# Patient Record
Sex: Female | Born: 1960
Health system: Southern US, Community
[De-identification: ages and names within clinical notes are randomized; demographics above are authoritative.]

## PROBLEM LIST (undated history)

## (undated) DIAGNOSIS — C439 Malignant melanoma of skin, unspecified: Secondary | ICD-10-CM

## (undated) DIAGNOSIS — E039 Hypothyroidism, unspecified: Secondary | ICD-10-CM

## (undated) DIAGNOSIS — F0390 Unspecified dementia without behavioral disturbance: Secondary | ICD-10-CM

## (undated) DIAGNOSIS — C3490 Malignant neoplasm of unspecified part of unspecified bronchus or lung: Secondary | ICD-10-CM

## (undated) DIAGNOSIS — F419 Anxiety disorder, unspecified: Secondary | ICD-10-CM

## (undated) DIAGNOSIS — H53462 Homonymous bilateral field defects, left side: Secondary | ICD-10-CM

## (undated) DIAGNOSIS — C719 Malignant neoplasm of brain, unspecified: Secondary | ICD-10-CM

## (undated) DIAGNOSIS — G4489 Other headache syndrome: Secondary | ICD-10-CM

## (undated) DIAGNOSIS — K219 Gastro-esophageal reflux disease without esophagitis: Secondary | ICD-10-CM

## (undated) DIAGNOSIS — E079 Disorder of thyroid, unspecified: Secondary | ICD-10-CM

## (undated) DIAGNOSIS — H93239 Hyperacusis, unspecified ear: Secondary | ICD-10-CM

## (undated) DIAGNOSIS — S0300XA Dislocation of jaw, unspecified side, initial encounter: Secondary | ICD-10-CM

## (undated) HISTORY — PX: APPENDECTOMY: SHX54

## (undated) HISTORY — DX: Hyperacusis, unspecified ear: H93.239

## (undated) HISTORY — DX: Other headache syndrome: G44.89

## (undated) HISTORY — DX: Malignant melanoma of skin, unspecified: C43.9

## (undated) HISTORY — DX: Malignant neoplasm of brain, unspecified: C71.9

## (undated) HISTORY — DX: Malignant neoplasm of unspecified part of unspecified bronchus or lung: C34.90

## (undated) HISTORY — PX: TONSILLECTOMY: SUR1361

---
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
## 2002-08-21 ENCOUNTER — Other Ambulatory Visit: Admission: RE | Admit: 2002-08-21 | Discharge: 2002-08-21 | Payer: Self-pay | Admitting: *Deleted

## 2003-04-13 ENCOUNTER — Emergency Department (HOSPITAL_COMMUNITY): Admission: EM | Admit: 2003-04-13 | Discharge: 2003-04-13 | Payer: Self-pay | Admitting: Emergency Medicine

## 2003-04-13 ENCOUNTER — Encounter: Payer: Self-pay | Admitting: Emergency Medicine

## 2010-12-18 ENCOUNTER — Encounter: Payer: Self-pay | Admitting: Family Medicine

## 2013-11-14 ENCOUNTER — Emergency Department (HOSPITAL_COMMUNITY): Payer: 59

## 2013-11-14 ENCOUNTER — Inpatient Hospital Stay (HOSPITAL_BASED_OUTPATIENT_CLINIC_OR_DEPARTMENT_OTHER)
Admission: EM | Admit: 2013-11-14 | Discharge: 2013-11-22 | DRG: 025 | Disposition: A | Discharge status: Home / Self Care | Payer: 59 | Attending: Neurological Surgery | Admitting: Neurological Surgery

## 2013-11-14 ENCOUNTER — Encounter (HOSPITAL_BASED_OUTPATIENT_CLINIC_OR_DEPARTMENT_OTHER): Payer: Self-pay | Admitting: Emergency Medicine

## 2013-11-14 DIAGNOSIS — Z720 Tobacco use: Secondary | ICD-10-CM | POA: Diagnosis present

## 2013-11-14 DIAGNOSIS — D72829 Elevated white blood cell count, unspecified: Secondary | ICD-10-CM | POA: Diagnosis present

## 2013-11-14 DIAGNOSIS — E039 Hypothyroidism, unspecified: Secondary | ICD-10-CM | POA: Diagnosis present

## 2013-11-14 DIAGNOSIS — F411 Generalized anxiety disorder: Secondary | ICD-10-CM | POA: Diagnosis not present

## 2013-11-14 DIAGNOSIS — M26609 Unspecified temporomandibular joint disorder, unspecified side: Secondary | ICD-10-CM | POA: Diagnosis present

## 2013-11-14 DIAGNOSIS — C7931 Secondary malignant neoplasm of brain: Principal | ICD-10-CM | POA: Diagnosis present

## 2013-11-14 DIAGNOSIS — Z820 Family history of epilepsy and other diseases of the nervous system: Secondary | ICD-10-CM

## 2013-11-14 DIAGNOSIS — R911 Solitary pulmonary nodule: Secondary | ICD-10-CM | POA: Diagnosis present

## 2013-11-14 DIAGNOSIS — F17200 Nicotine dependence, unspecified, uncomplicated: Secondary | ICD-10-CM | POA: Diagnosis present

## 2013-11-14 DIAGNOSIS — G9389 Other specified disorders of brain: Secondary | ICD-10-CM | POA: Diagnosis present

## 2013-11-14 DIAGNOSIS — C3490 Malignant neoplasm of unspecified part of unspecified bronchus or lung: Secondary | ICD-10-CM | POA: Diagnosis present

## 2013-11-14 DIAGNOSIS — G936 Cerebral edema: Secondary | ICD-10-CM | POA: Diagnosis present

## 2013-11-14 DIAGNOSIS — Z79899 Other long term (current) drug therapy: Secondary | ICD-10-CM

## 2013-11-14 DIAGNOSIS — Z9889 Other specified postprocedural states: Secondary | ICD-10-CM

## 2013-11-14 HISTORY — DX: Dislocation of jaw, unspecified side, initial encounter: S03.00XA

## 2013-11-14 HISTORY — DX: Disorder of thyroid, unspecified: E07.9

## 2013-11-14 LAB — CBC WITH DIFFERENTIAL/PLATELET
Basophils Absolute: 0 10*3/uL (ref 0.0–0.1)
Eosinophils Absolute: 0.1 10*3/uL (ref 0.0–0.7)
Eosinophils Relative: 0 % (ref 0–5)
MCH: 31.7 pg (ref 26.0–34.0)
MCV: 89.7 fL (ref 78.0–100.0)
Neutro Abs: 8.8 10*3/uL — ABNORMAL HIGH (ref 1.7–7.7)
RBC: 5.15 MIL/uL — ABNORMAL HIGH (ref 3.87–5.11)
RDW: 13.1 % (ref 11.5–15.5)
WBC: 11.3 10*3/uL — ABNORMAL HIGH (ref 4.0–10.5)

## 2013-11-14 LAB — BASIC METABOLIC PANEL
BUN: 14 mg/dL (ref 6–23)
CO2: 27 mEq/L (ref 19–32)
Chloride: 100 mEq/L (ref 96–112)
Creatinine, Ser: 0.6 mg/dL (ref 0.50–1.10)
Potassium: 3.7 mEq/L (ref 3.5–5.1)

## 2013-11-14 MED ORDER — HYDROMORPHONE HCL PF 1 MG/ML IJ SOLN: 1.0000 mg | Freq: Once | INTRAMUSCULAR | Status: DC

## 2013-11-14 MED ORDER — MECLIZINE HCL 25 MG PO TABS
25.0000 mg | ORAL_TABLET | Freq: Once | ORAL | Status: AC
  Administered 2013-11-14: 25 mg via ORAL
  Filled 2013-11-14: qty 1

## 2013-11-14 MED ORDER — DEXAMETHASONE SODIUM PHOSPHATE 10 MG/ML IJ SOLN
10.0000 mg | Freq: Once | INTRAMUSCULAR | Status: AC
  Administered 2013-11-14: 10 mg via INTRAVENOUS
  Filled 2013-11-14: qty 1

## 2013-11-14 MED ORDER — ONDANSETRON 4 MG PO TBDP
4.0000 mg | ORAL_TABLET | Freq: Once | ORAL | Status: AC
  Administered 2013-11-14: 4 mg via ORAL
  Filled 2013-11-14: qty 1

## 2013-11-14 MED ORDER — SODIUM CHLORIDE 0.9 % IV BOLUS (SEPSIS)
1000.0000 mL | Freq: Once | INTRAVENOUS | Status: AC
  Administered 2013-11-14: 1000 mL via INTRAVENOUS

## 2013-11-14 MED ORDER — GADOBENATE DIMEGLUMINE 529 MG/ML IV SOLN
10.0000 mL | Freq: Once | INTRAVENOUS | Status: AC | PRN
  Administered 2013-11-14: 10 mL via INTRAVENOUS

## 2013-11-14 MED ORDER — DEXAMETHASONE SODIUM PHOSPHATE 10 MG/ML IJ SOLN
6.0000 mg | Freq: Four times a day (QID) | INTRAMUSCULAR | Status: DC
  Administered 2013-11-14: 6 mg via INTRAVENOUS
  Filled 2013-11-14: qty 1

## 2013-11-14 MED ORDER — LORAZEPAM 2 MG/ML IJ SOLN
0.5000 mg | Freq: Once | INTRAMUSCULAR | Status: AC
  Administered 2013-11-14: 0.5 mg via INTRAVENOUS
  Filled 2013-11-14: qty 1

## 2013-11-14 MED ORDER — METOCLOPRAMIDE HCL 5 MG/ML IJ SOLN
10.0000 mg | Freq: Once | INTRAMUSCULAR | Status: AC
  Administered 2013-11-14: 10 mg via INTRAVENOUS
  Filled 2013-11-14: qty 2

## 2013-11-14 MED ORDER — SODIUM CHLORIDE 0.9 % IV SOLN
INTRAVENOUS | Status: DC
  Administered 2013-11-14: 18:00:00 via INTRAVENOUS

## 2013-11-14 MED ORDER — ONDANSETRON HCL 4 MG/2ML IJ SOLN
4.0000 mg | Freq: Once | INTRAMUSCULAR | Status: AC
  Administered 2013-11-14: 4 mg via INTRAVENOUS
  Filled 2013-11-14: qty 2

## 2013-11-14 MED ORDER — NICOTINE 14 MG/24HR TD PT24
14.0000 mg | MEDICATED_PATCH | Freq: Once | TRANSDERMAL | Status: AC
  Administered 2013-11-14 – 2013-11-15 (×2): 14 mg via TRANSDERMAL
  Filled 2013-11-14 (×2): qty 1

## 2013-11-14 NOTE — ED Provider Notes (Signed)
Medical screening examination/treatment/procedure(s) were conducted as a shared visit with non-physician practitioner(s) and myself.  I personally evaluated the patient during the encounter.  EKG Interpretation   None       Ct Head Wo Contrast  11/14/2013   CLINICAL DATA:  Dizziness.  EXAM: CT HEAD WITHOUT CONTRAST  TECHNIQUE: Contiguous axial images were obtained from the base of the skull through the vertex without intravenous contrast.  COMPARISON:  None.  FINDINGS: An 8 mm focus of hyperdensity is present within the right cerebellum with significant surrounding edema. This creates mass effect on the 4th ventricle with midline shift and partial effacement. The basal cisterns are partially effaced as well.  The supratentorial brain is within normal limits. No acute cortical infarct or mass lesion is present. No other hemorrhage is evident. The lateral ventricles are of normal size. No significant extra-axial fluid collection is present.  IMPRESSION: 1. 8 mm hyperdensity in within the right cerebellum concerning for hemorrhage or mass lesion with significant surrounding vasogenic edema. MRI of the brain without and with contrast is recommended for further evaluation. 2. No significant supratentorial lesions are evident. Critical Value/emergent results were called by telephone at the time of interpretation on 11/14/2013 at 4:01 PM to Dr. Blinda Leatherwood , who verbally acknowledged these results.   Electronically Signed   By: Gennette Pac M.D.   On: 11/14/2013 16:01   Mr Laqueta Jean ZO Contrast  11/14/2013   CLINICAL DATA:  Dizziness.  Emesis.  EXAM: MRI HEAD WITHOUT AND WITH CONTRAST  TECHNIQUE: Multiplanar, multiecho pulse sequences of the brain and surrounding structures were obtained without and with intravenous contrast.  CONTRAST:  10mL MULTIHANCE GADOBENATE DIMEGLUMINE 529 MG/ML IV SOLN  COMPARISON:  CT head earlier in the day at 1552 hr.  FINDINGS: As suspected from CT, there is a right cerebellar  hemispheric mass with marked surrounding edema. The lesion contains a nonenhancing T2 hypointense component roughly 1 cm in size, best displayed on coronal T2 weighted images. The enhancing portion of the mass is somewhat larger, measuring 16 x 17 x 13 mm, appears infiltrative, with vasogenic edema spreading into the vermis and displacing the 4th ventricle approximately 2 mm right to left. The 4th ventricle is sufficiently compressed the raise concern of impending hydrocephalus should the edema worsen. No other intracranial lesions are seen.  There are no areas of acute stroke, extra-axial fluid, or hydrocephalus. Calvarium intact without osseous lesions. No craniocervical junction or upper cervical abnormality. No sinus or mastoid disease. Negative orbits. Intracranial vasculature widely patent.  Compared with the earlier CT, the appearance is not significantly worsened.  IMPRESSION: 16 x 17 x 13 mm right cerebellar infiltrative mass with marked surrounding edema. Centrally there is a component of predominantly T2 shortening suggesting acute blood or melanin. Calcification/mineralization felt less likely. Metastases which could present with this constellation of imaging findings include melanoma, hemorrhagic lung, breast, renal cell, or thyroid carcinoma, versus a primary cerebellar brain tumor such as glioma. Cerebral abscess is not favored.  Significant vasogenic edema in the setting of a possibly acutely hemorrhagic mass is concerning. The patient should be carefully observed in a neuro ICU setting for signs of increased intracranial pressure should the lesion worsen due to increasing hemorrhage or obstruction of the 4th ventricle.  Findings discussed with ordering provider at the time of dictation.   Electronically Signed   By: Davonna Belling M.D.   On: 11/14/2013 19:25  I personally reviewed the imaging tests through PACS system I reviewed available  ER/hospitalization records through the EMR I discussed the  findings with radiology  Patient is overall well-appearing.  Her symptoms seem improved at this time.  She's feeling better.  She is alert and oriented x3.  She will benefit from a missed the step down unit but wanted to be followed closely.  This was discussed with neurosurgery who will consult.  Decadron ordered after head CT findings found.  CRITICAL CARE Performed by: Lyanne Co Total critical care time: 32 Critical care time was exclusive of separately billable procedures and treating other patients. Critical care was necessary to treat or prevent imminent or life-threatening deterioration. Critical care was time spent personally by me on the following activities: development of treatment plan with patient and/or surrogate as well as nursing, discussions with consultants, evaluation of patient's response to treatment, examination of patient, obtaining history from patient or surrogate, ordering and performing treatments and interventions, ordering and review of laboratory studies, ordering and review of radiographic studies, pulse oximetry and re-evaluation of patient's condition.   Lyanne Co, MD 11/14/13 2130

## 2013-11-14 NOTE — ED Notes (Signed)
Pt c/o headache, dizziness and vomiting intermittently x 1 wk. Pt sts she was diagnosed at PCP with gastroenteritis Tuesday and given phenergan. Pt sts she ate and drank yesterday and became dizzy last night when lying down and vomiting again.

## 2013-11-14 NOTE — ED Provider Notes (Signed)
Patient to the ED transferred by Dr. Jodi Mourning from [facility].   "52 yo female with smoking hx, no stroke hx presents with recurrent vertigo for one week, pt saw pcp Tue and phenergan improved vomiting and sxs however returned. Worse with head movement or lying flat at night. Lasts up to a minute then resolves with not moving. No gait or vision changes. Pt has posterior HA for the past week, gradual onset. No other neuro sxs. " - Dr. Jodi Mourning  Patient continues to have dizziness. CT head ordered.   Ct Head Wo Contrast  11/14/2013   CLINICAL DATA:  Dizziness.  EXAM: CT HEAD WITHOUT CONTRAST  TECHNIQUE: Contiguous axial images were obtained from the base of the skull through the vertex without intravenous contrast.  COMPARISON:  None.  FINDINGS: An 8 mm focus of hyperdensity is present within the right cerebellum with significant surrounding edema. This creates mass effect on the 4th ventricle with midline shift and partial effacement. The basal cisterns are partially effaced as well.  The supratentorial brain is within normal limits. No acute cortical infarct or mass lesion is present. No other hemorrhage is evident. The lateral ventricles are of normal size. No significant extra-axial fluid collection is present.  IMPRESSION: 1. 8 mm hyperdensity in within the right cerebellum concerning for hemorrhage or mass lesion with significant surrounding vasogenic edema. MRI of the brain without and with contrast is recommended for further evaluation. 2. No significant supratentorial lesions are evident. Critical Value/emergent results were called by telephone at the time of interpretation on 11/14/2013 at 4:01 PM to Dr. Blinda Leatherwood , who verbally acknowledged these results.   Electronically Signed   By: Gennette Pac M.D.   On: 11/14/2013 16:01   4:15pm. CT head shows abnormality.  Decadron 10mg  IV, Antivert and Zofran 4mg  ODT ordered.  MRI of brain pending to better characterize brain  mass.  7:39pm  Radiologist reports that patient has large mass to occipital region with significant edema, questionable whether it is slowly bleeding or if the mass is highly vascularized. Patient needs neurosurgical consult.  Dr. Patria Mane and I re-evaluated patient together, she is comfortable, awake, alerted and oriented. Continues to endorse positional dizziness.  8:22pm- I spoke with Neurosurg Dr. Yetta Barre who has reviewed Images and recommends she be transferred to [facility] and admitted to step-down by medicine for metastatic work-up. He will consult.  Dr. Toniann Fail has agreed to admit patient, Dr. Patria Mane has done EMTALA, pt to be transferred to [facility].  Dorthula Matas, PA-C 11/14/13 2023

## 2013-11-14 NOTE — ED Notes (Signed)
Report called to Charge nurse at [facility]

## 2013-11-14 NOTE — H&P (Signed)
[facility] History and Physical  [name] [name] YNW:[id number] DOB: Jan 30, 1961 DOA: 11/14/2013  Referring physician: ER physician. PCP: Joycelyn Rua, MD   Chief Complaint: Headache and dizziness.  HPI: [name] [name] is a 52 y.o. female with history of hypothyroidism and ongoing tobacco abuse presented to the ER because of dizziness headache and nausea vomiting. Patient has been having these symptoms for last 5 days. Patient's dizziness is mostly positional and increases on turning. Patient's headache is mostly occipital region and patient feels like a tightness around the neck. Patient does not have any weakness of the upper lower extremities but felt difficulty writing last few days. Denies any blurred vision difficulty speaking or swallowing. Patient did not lose consciousness. In the ER MRI of the brain shows cerebellar mass with edema. On-call neurosurgeon Dr. Yetta Barre was consulted by the ER physician and at this time Dr. Yetta Barre has requested IV Decadron, transfer to [facility] and metastatic workup. Patient otherwise denies any chest pain shortness of breath abdominal pain diarrhea fever chills productive cough.   Review of Systems: As presented in the history of presenting illness, rest negative.  Past Medical History  Diagnosis Date  . TMJ (dislocation of temporomandibular joint)   . Thyroid disease    Past Surgical History  Procedure Laterality Date  . Tonsillectomy    . Appendectomy     Social History:  reports that she has been smoking.  She does not have any smokeless tobacco history on file. She reports that she does not drink alcohol or use illicit drugs. Where does patient live home. Can patient participate in ADLs? Yes.  No Known Allergies  Family History:  Family History  Problem Relation Age of Onset  . Dementia Mother       Prior to Admission medications   Medication Sig Start Date End Date Taking? Authorizing Provider  levothyroxine (SYNTHROID,  LEVOTHROID) 125 MCG tablet Take 125 mcg by mouth daily before breakfast.   Yes Historical Provider, MD  piroxicam (FELDENE) 20 MG capsule Take 20 mg by mouth daily.   Yes Historical Provider, MD  Xylometazoline HCl (4-WAY NASAL SPRAY NA) Place 1 spray into the nose as needed (for nasal congestion).   Yes Historical Provider, MD    Physical Exam: Filed Vitals:   11/14/13 1300 11/14/13 1330 11/14/13 1400 11/14/13 1950  BP: 124/82 135/81  124/86  Pulse: 63 68 63 64  Temp:    97.8 F (36.6 C)  TempSrc:    Oral  Resp:    18  SpO2: 97% 100% 99% 99%     General:  Well-developed and nourished.  Eyes: Anicteric no pallor.  ENT: No discharge from ears eyes nose mouth.  Neck: No mass felt.  Cardiovascular: S1-S2 heard.  Respiratory: No rhonchi or crepitations.  Abdomen: Soft nontender bowel sounds present.  Skin: No rash.  Musculoskeletal: No edema.  Psychiatric: Appears normal.  Neurologic: Alert awake oriented to time place and person. Moves all extremities 5 x 5. No facial asymmetry.  Labs on Admission:  Basic Metabolic Panel:  Recent Labs Lab 11/14/13 1035  NA 139  K 3.7  CL 100  CO2 27  GLUCOSE 128*  BUN 14  CREATININE 0.60  CALCIUM 10.2   Liver Function Tests: No results found for this basename: AST, ALT, ALKPHOS, BILITOT, PROT, ALBUMIN,  in the last 168 hours No results found for this basename: LIPASE, AMYLASE,  in the last 168 hours No results found for this basename: AMMONIA,  in the last  168 hours CBC:  Recent Labs Lab 11/14/13 1035  WBC 11.3*  NEUTROABS 8.8*  HGB 16.3*  HCT 46.2*  MCV 89.7  PLT 311   Cardiac Enzymes: No results found for this basename: CKTOTAL, CKMB, CKMBINDEX, TROPONINI,  in the last 168 hours  BNP (last 3 results) No results found for this basename: PROBNP,  in the last 8760 hours CBG: No results found for this basename: GLUCAP,  in the last 168 hours  Radiological Exams on Admission: Ct Head Wo Contrast  11/14/2013    CLINICAL DATA:  Dizziness.  EXAM: CT HEAD WITHOUT CONTRAST  TECHNIQUE: Contiguous axial images were obtained from the base of the skull through the vertex without intravenous contrast.  COMPARISON:  None.  FINDINGS: An 8 mm focus of hyperdensity is present within the right cerebellum with significant surrounding edema. This creates mass effect on the 4th ventricle with midline shift and partial effacement. The basal cisterns are partially effaced as well.  The supratentorial brain is within normal limits. No acute cortical infarct or mass lesion is present. No other hemorrhage is evident. The lateral ventricles are of normal size. No significant extra-axial fluid collection is present.  IMPRESSION: 1. 8 mm hyperdensity in within the right cerebellum concerning for hemorrhage or mass lesion with significant surrounding vasogenic edema. MRI of the brain without and with contrast is recommended for further evaluation. 2. No significant supratentorial lesions are evident. Critical Value/emergent results were called by telephone at the time of interpretation on 11/14/2013 at 4:01 PM to Dr. Blinda Leatherwood , who verbally acknowledged these results.   Electronically Signed   By: Gennette Pac M.D.   On: 11/14/2013 16:01   Mr Laqueta Jean XB Contrast  11/14/2013   CLINICAL DATA:  Dizziness.  Emesis.  EXAM: MRI HEAD WITHOUT AND WITH CONTRAST  TECHNIQUE: Multiplanar, multiecho pulse sequences of the brain and surrounding structures were obtained without and with intravenous contrast.  CONTRAST:  10mL MULTIHANCE GADOBENATE DIMEGLUMINE 529 MG/ML IV SOLN  COMPARISON:  CT head earlier in the day at 1552 hr.  FINDINGS: As suspected from CT, there is a right cerebellar hemispheric mass with marked surrounding edema. The lesion contains a nonenhancing T2 hypointense component roughly 1 cm in size, best displayed on coronal T2 weighted images. The enhancing portion of the mass is somewhat larger, measuring 16 x 17 x 13 mm, appears  infiltrative, with vasogenic edema spreading into the vermis and displacing the 4th ventricle approximately 2 mm right to left. The 4th ventricle is sufficiently compressed the raise concern of impending hydrocephalus should the edema worsen. No other intracranial lesions are seen.  There are no areas of acute stroke, extra-axial fluid, or hydrocephalus. Calvarium intact without osseous lesions. No craniocervical junction or upper cervical abnormality. No sinus or mastoid disease. Negative orbits. Intracranial vasculature widely patent.  Compared with the earlier CT, the appearance is not significantly worsened.  IMPRESSION: 16 x 17 x 13 mm right cerebellar infiltrative mass with marked surrounding edema. Centrally there is a component of predominantly T2 shortening suggesting acute blood or melanin. Calcification/mineralization felt less likely. Metastases which could present with this constellation of imaging findings include melanoma, hemorrhagic lung, breast, renal cell, or thyroid carcinoma, versus a primary cerebellar brain tumor such as glioma. Cerebral abscess is not favored.  Significant vasogenic edema in the setting of a possibly acutely hemorrhagic mass is concerning. The patient should be carefully observed in a neuro ICU setting for signs of increased intracranial pressure should the lesion worsen  due to increasing hemorrhage or obstruction of the 4th ventricle.  Findings discussed with ordering provider at the time of dictation.   Electronically Signed   By: Davonna Belling M.D.   On: 11/14/2013 19:25    Assessment/Plan Principal Problem:   Cerebellar mass Active Problems:   Hypothyroid   Tobacco abuse   1. Cerebellar mass with edema - ER physician Dr. Patria Mane has already discussed with Dr. Yetta Barre and Dr. Yetta Barre has advised patient to be admitted to [facility]. Patient is agreeable to transfer. Patient is already received Decadron 10 mg IV and I have placed patient on Decadron 6 mg IV every 6  hourly. I have ordered CT chest abdomen and pelvis with contrast for metastatic workup. Closely observe in down. Further recommendations per neurosurgery. 2. Hypothyroidism - we will continue Synthroid home dose. 3. Tobacco abuse - cessation counseling requested. 4. Mild leukocytosis - patient is afebrile. Closely follow CBC.   I have conveyed the message to her accepting physician Dr. Allena Katz.   Code Status: Full code.  Family Communication: Patient's husband at the bedside.  Disposition Plan: Admit to inpatient.    Eliot Popper N. [facility] Pager [phone].  If 7PM-7AM, please contact night-coverage [email or web address] Password [email or web address] 11/14/2013, 9:36 PM

## 2013-11-14 NOTE — Progress Notes (Signed)
Report called to Leotis Shames, RN and [facility].

## 2013-11-14 NOTE — ED Notes (Signed)
Bed: ZO10 Expected date:  Expected time:  Means of arrival:  Comments: [facility] transfer

## 2013-11-14 NOTE — ED Provider Notes (Signed)
CSN: [id number]     Arrival date & time 11/14/13  6962 History   First MD Initiated Contact with Patient 11/14/13 [phone]     Chief Complaint  Patient presents with  . Dizziness  . Emesis   (Consider location/radiation/quality/duration/timing/severity/associated sxs/prior Treatment) HPI Comments: 52 yo female with smoking hx, no stroke hx presents with recurrent vertigo for one week, pt saw pcp Tue and phenergan improved vomiting and sxs however returned. Worse with head movement or lying flat at night. Lasts up to a minute then resolves with not moving. No gait or vision changes.  Pt has posterior HA for the past week, gradual onset.  No other neuro sxs.    Patient is a 52 y.o. female presenting with dizziness and vomiting. The history is provided by the patient.  Dizziness Quality:  Head spinning Severity:  Moderate Timing:  Intermittent Progression:  Worsening Chronicity:  Recurrent Context: head movement   Context: not with eye movement, not with loss of consciousness, not with medication and not with physical activity   Relieved by:  Being still Worsened by:  Turning head Associated symptoms: nausea and vomiting   Associated symptoms: no blood in stool, no chest pain, no headaches, no shortness of breath, no vision changes and no weakness   Emesis Associated symptoms: no abdominal pain, no chills and no headaches     Past Medical History  Diagnosis Date  . TMJ (dislocation of temporomandibular joint)   . Thyroid disease    Past Surgical History  Procedure Laterality Date  . Tonsillectomy    . Appendectomy     No family history on file. History  Substance Use Topics  . Smoking status: Current Every Day Smoker  . Smokeless tobacco: Not on file  . Alcohol Use: No   OB History   Grav Para Term Preterm Abortions TAB SAB Ect Mult Living                 Review of Systems  Constitutional: Positive for appetite change. Negative for fever and chills.  HENT: Negative for  congestion.   Eyes: Negative for visual disturbance.  Respiratory: Negative for shortness of breath.   Cardiovascular: Negative for chest pain.  Gastrointestinal: Positive for nausea and vomiting. Negative for abdominal pain and blood in stool.  Genitourinary: Negative for dysuria and flank pain.  Musculoskeletal: Positive for neck pain (posterior midline). Negative for back pain and neck stiffness.  Skin: Negative for rash.  Neurological: Positive for dizziness and light-headedness. Negative for syncope and headaches.    Allergies  Review of patient's allergies indicates no known allergies.  Home Medications   Current Outpatient Rx  Name  Route  Sig  Dispense  Refill  . Levothyroxine Sodium (SYNTHROID PO)   Oral   Take by mouth.         . Piroxicam (FELDENE PO)   Oral   Take by mouth.          BP 142/88  Pulse 85  Temp(Src) 97.5 F (36.4 C) (Oral)  Resp 18  SpO2 100% Physical Exam  Nursing note and vitals reviewed. Constitutional: She is oriented to person, place, and time. She appears well-developed and well-nourished.  HENT:  Head: Normocephalic and atraumatic.  Mild dry mm  Eyes: Conjunctivae are normal. Right eye exhibits no discharge. Left eye exhibits no discharge.  Neck: Normal range of motion. Neck supple. No tracheal deviation present.  Cardiovascular: Normal rate and regular rhythm.   Pulmonary/Chest: Effort normal and breath sounds  normal.  Abdominal: Soft. She exhibits no distension. There is no tenderness. There is no guarding.  Musculoskeletal: She exhibits no edema.  Neurological: She is alert and oriented to person, place, and time. No cranial nerve deficit or sensory deficit. Coordination and gait normal. GCS eye subscore is 4. GCS verbal subscore is 5. GCS motor subscore is 6.  5+ strength in UE and LE with f/e at major joints. Sensation to palpation intact in UE and LE. CNs 2-12 grossly intact.  EOMFI.  PERRL.   Finger nose and coordination  intact bilateral.   Visual fields intact to finger testing. No nystagmus Sxs worse dix hallpike to the left  Skin: Skin is warm. No rash noted.  Psychiatric: She has a normal mood and affect.    ED Course  Procedures (including critical care time) Labs Review Labs Reviewed  BASIC METABOLIC PANEL - Abnormal; Notable for the following:    Glucose, Bld 128 (*)    All other components within normal limits  CBC WITH DIFFERENTIAL - Abnormal; Notable for the following:    WBC 11.3 (*)    RBC 5.15 (*)    Hemoglobin 16.3 (*)    HCT 46.2 (*)    Neutrophils Relative % 78 (*)    Neutro Abs 8.8 (*)    All other components within normal limits   Imaging Review No results found.  EKG Interpretation   None       MDM   1. Vertigo   2. Headache   3. Vomiting    Clinically likely BPPV with intermittent short lasting vertigo/ vomiting. Normal neuro exam. Concern is HA with sxs.  Pt has had similar ha's in the past, gradual onset however started prior to sxs. Plan for fluids, antiemetics and labs. On recheck pt does not feel improved, normal neuro. Discussed with ED physician as Lucien Mons, accepted for CT head and reassessment, pt wishes to be transferred by private vehicle, she is stable in ED, normal neuro exam and her husband is driving, she understands risks/ benefits of ambulance vs private vehicles, she has capacity to make decisions.    Transfered    Enid Skeens, MD 11/14/13 1349

## 2013-11-14 NOTE — ED Notes (Signed)
Patient to MRI.

## 2013-11-15 ENCOUNTER — Encounter (HOSPITAL_COMMUNITY): Payer: Self-pay | Admitting: Neurological Surgery

## 2013-11-15 ENCOUNTER — Inpatient Hospital Stay (HOSPITAL_COMMUNITY): Payer: 59

## 2013-11-15 LAB — CBC WITH DIFFERENTIAL/PLATELET
Basophils Absolute: 0 10*3/uL (ref 0.0–0.1)
Eosinophils Absolute: 0 10*3/uL (ref 0.0–0.7)
Eosinophils Relative: 0 % (ref 0–5)
HCT: 43 % (ref 36.0–46.0)
Lymphocytes Relative: 12 % (ref 12–46)
MCH: 31.8 pg (ref 26.0–34.0)
MCHC: 34.7 g/dL (ref 30.0–36.0)
MCV: 91.7 fL (ref 78.0–100.0)
Platelets: 298 10*3/uL (ref 150–400)
RDW: 13.3 % (ref 11.5–15.5)
WBC: 7.3 10*3/uL (ref 4.0–10.5)

## 2013-11-15 LAB — COMPREHENSIVE METABOLIC PANEL
ALT: 13 U/L (ref 0–35)
Albumin: 3.5 g/dL (ref 3.5–5.2)
Alkaline Phosphatase: 105 U/L (ref 39–117)
BUN: 10 mg/dL (ref 6–23)
CO2: 23 mEq/L (ref 19–32)
Chloride: 104 mEq/L (ref 96–112)
GFR calc non Af Amer: >90 mL/min (ref >90)
Glucose, Bld: 135 mg/dL — ABNORMAL HIGH (ref 70–99)
Potassium: 3.5 mEq/L (ref 3.5–5.1)
Total Bilirubin: 0.4 mg/dL (ref 0.3–1.2)
Total Protein: 6.6 g/dL (ref 6.0–8.3)

## 2013-11-15 LAB — PROTIME-INR: Prothrombin Time: 13.7 seconds (ref 11.6–15.2)

## 2013-11-15 MED ORDER — NICOTINE 14 MG/24HR TD PT24: 14.0000 mg | MEDICATED_PATCH | Freq: Every day | TRANSDERMAL | Status: DC

## 2013-11-15 MED ORDER — ACETAMINOPHEN 325 MG PO TABS
650.0000 mg | ORAL_TABLET | Freq: Four times a day (QID) | ORAL | Status: DC | PRN
  Administered 2013-11-15 – 2013-11-18 (×2): 650 mg via ORAL
  Filled 2013-11-15 (×2): qty 2

## 2013-11-15 MED ORDER — DEXAMETHASONE SODIUM PHOSPHATE 4 MG/ML IJ SOLN
6.0000 mg | Freq: Four times a day (QID) | INTRAMUSCULAR | Status: DC
  Administered 2013-11-15 – 2013-11-17 (×10): 6 mg via INTRAVENOUS
  Filled 2013-11-15 (×8): qty 1.5
  Filled 2013-11-15 (×2): qty 2
  Filled 2013-11-15 (×5): qty 1.5

## 2013-11-15 MED ORDER — ONDANSETRON HCL 4 MG/2ML IJ SOLN
4.0000 mg | Freq: Four times a day (QID) | INTRAMUSCULAR | Status: DC | PRN
  Administered 2013-11-15 – 2013-11-18 (×3): 4 mg via INTRAVENOUS
  Filled 2013-11-15 (×3): qty 2

## 2013-11-15 MED ORDER — PNEUMOCOCCAL VAC POLYVALENT 25 MCG/0.5ML IJ INJ
0.5000 mL | INJECTION | INTRAMUSCULAR | Status: AC
  Administered 2013-11-16: 0.5 mL via INTRAMUSCULAR
  Filled 2013-11-15: qty 0.5

## 2013-11-15 MED ORDER — ACETAMINOPHEN-CODEINE #3 300-30 MG PO TABS
1.0000 | ORAL_TABLET | ORAL | Status: DC | PRN
  Administered 2013-11-15 – 2013-11-22 (×22): 2 via ORAL
  Filled 2013-11-15 (×22): qty 2

## 2013-11-15 MED ORDER — NICOTINE 14 MG/24HR TD PT24
14.0000 mg | MEDICATED_PATCH | Freq: Every day | TRANSDERMAL | Status: DC
  Administered 2013-11-16 – 2013-11-22 (×6): 14 mg via TRANSDERMAL
  Filled 2013-11-15 (×8): qty 1

## 2013-11-15 MED ORDER — ONDANSETRON HCL 4 MG PO TABS
4.0000 mg | ORAL_TABLET | Freq: Four times a day (QID) | ORAL | Status: DC | PRN
  Administered 2013-11-16 – 2013-11-20 (×3): 4 mg via ORAL
  Filled 2013-11-15 (×2): qty 1

## 2013-11-15 MED ORDER — IOHEXOL 300 MG/ML  SOLN
80.0000 mL | Freq: Once | INTRAMUSCULAR | Status: AC | PRN
  Administered 2013-11-15: 80 mL via INTRAVENOUS

## 2013-11-15 MED ORDER — ACETAMINOPHEN 650 MG RE SUPP: 650.0000 mg | Freq: Four times a day (QID) | RECTAL | Status: DC | PRN

## 2013-11-15 MED ORDER — PANTOPRAZOLE SODIUM 40 MG PO TBEC
40.0000 mg | DELAYED_RELEASE_TABLET | Freq: Every day | ORAL | Status: DC
  Administered 2013-11-16 – 2013-11-18 (×3): 40 mg via ORAL
  Filled 2013-11-15 (×2): qty 1

## 2013-11-15 MED ORDER — IOHEXOL 300 MG/ML  SOLN
25.0000 mL | INTRAMUSCULAR | Status: AC
  Administered 2013-11-15 (×2): 25 mL via ORAL

## 2013-11-15 MED ORDER — PANTOPRAZOLE SODIUM 40 MG IV SOLR
40.0000 mg | Freq: Every day | INTRAVENOUS | Status: DC
  Administered 2013-11-15: 40 mg via INTRAVENOUS
  Filled 2013-11-15 (×2): qty 40

## 2013-11-15 MED ORDER — SODIUM CHLORIDE 0.9 % IJ SOLN
3.0000 mL | Freq: Two times a day (BID) | INTRAMUSCULAR | Status: DC
  Administered 2013-11-15 – 2013-11-22 (×10): 3 mL via INTRAVENOUS

## 2013-11-15 MED ORDER — LEVOTHYROXINE SODIUM 125 MCG PO TABS
125.0000 ug | ORAL_TABLET | Freq: Every day | ORAL | Status: DC
  Administered 2013-11-15 – 2013-11-22 (×7): 125 ug via ORAL
  Filled 2013-11-15 (×11): qty 1

## 2013-11-15 MED ORDER — SODIUM CHLORIDE 0.9 % IV SOLN
INTRAVENOUS | Status: DC
  Administered 2013-11-15: via INTRAVENOUS

## 2013-11-15 NOTE — Consult Note (Signed)
Reason for Consult: Right cerebellar mass Referring Physician: EDP  [name] [name] is an 52 y.o. female.   HPI:  This patient presented to the emergency department with a several week history of progressive headaches. He noted some and balance and may be some change in her hand writing. Otherwise no numbness tingling or weakness. No visual changes. No Difficulty swallowing. She started having some nausea and vomiting and went to her primary care physician and diagnosed her with gastroenteritis and treated her with Phenergan. Her symptoms continue to progress and she can't do the emergency department where a head CT showed a right cerebellar lesion with surrounding edema. Headaches had improved since admission and placement on Decadron. They are mild and aching in character. No nausea at this time. MRI showed a enhancing lesion in the right cerebellar hemisphere with surrounding edema but no hydrocephalus. Neurosurgical evaluation was requested. He describes a tooth infection requiring extraction of a 2-4-5 months ago. She is a heavy smoker.  Past Medical History  Diagnosis Date  . TMJ (dislocation of temporomandibular joint)   . Thyroid disease     Past Surgical History  Procedure Laterality Date  . Tonsillectomy    . Appendectomy      No Known Allergies  History  Substance Use Topics  . Smoking status: Current Every Day Smoker  . Smokeless tobacco: Not on file  . Alcohol Use: No    Family History  Problem Relation Age of Onset  . Dementia Mother      Review of Systems  Positive ROS: Tooth infection requiring extraction 4-5 months ago  All other systems have been reviewed and were otherwise negative with the exception of those mentioned in the HPI and as above.  Objective: Vital signs in last 24 hours: Temp:  [97.3 F (36.3 C)-98.6 F (37 C)] 97.3 F (36.3 C) (12/19 0758) Pulse Rate:  [62-95] 72 (12/19 0700) Resp:  [14-20] 14 (12/19 0700) BP: (106-142)/(65-88) 112/79  mmHg (12/19 0700) SpO2:  [91 %-100 %] 97 % (12/19 0700) Weight:  [53.9 kg (118 lb 13.3 oz)] 53.9 kg (118 lb 13.3 oz) (12/19 0000)  General Appearance: Alert, cooperative, no distress, appears stated age Head: Normocephalic, without obvious abnormality, atraumatic Eyes: PERRL, conjunctiva/corneas clear, EOM's intact    Neck: Supple, symmetrical, trachea midline Back: Symmetric, no curvature, ROM normal, no CVA tenderness Lungs:  respirations unlabored Heart: Regular rate and rhythm Abdomen: Soft, non-tender, bowel sounds active all four quadrants, no masses, no organomegaly Extremities: Extremities normal, atraumatic, no cyanosis or edema Pulses: 2+ and symmetric all extremities Skin: Skin color, texture, turgor normal, no rashes or lesions  NEUROLOGIC:   Mental status: A&O x4, no aphasia, good attention span, Memory and fund of knowledge Motor Exam - grossly normal, normal tone and bulk Sensory Exam - grossly normal Reflexes: symmetric, no pathologic reflexes, No Hoffman's, No clonus Coordination - grossly normal to in bed exam Gait - not tested Balance - not tested Cranial Nerves: I: smell Not tested  II: visual acuity  OS: na    OD: na  II: visual fields Full to confrontation  II: pupils Equal, round, reactive to light  III,VII: ptosis None  III,IV,VI: extraocular muscles  Full ROM  V: mastication Normal  V: facial light touch sensation  Normal  V,VII: corneal reflex  Present  VII: facial muscle function - upper  Normal  VII: facial muscle function - lower Normal  VIII: hearing Not tested  IX: soft palate elevation  Normal  IX,X:  gag reflex Present  XI: trapezius strength  5/5  XI: sternocleidomastoid strength 5/5  XI: neck flexion strength  5/5  XII: tongue strength  Normal    Data Review Lab Results  Component Value Date   WBC 7.3 11/15/2013   HGB 14.9 11/15/2013   HCT 43.0 11/15/2013   MCV 91.7 11/15/2013   PLT 298 11/15/2013   Lab Results  Component Value  Date   NA 139 11/14/2013   K 3.7 11/14/2013   CL 100 11/14/2013   CO2 27 11/14/2013   BUN 14 11/14/2013   CREATININE 0.60 11/14/2013   GLUCOSE 128* 11/14/2013   Lab Results  Component Value Date   INR 1.07 11/15/2013    Radiology: Ct Head Wo Contrast  11/14/2013   CLINICAL DATA:  Dizziness.  EXAM: CT HEAD WITHOUT CONTRAST  TECHNIQUE: Contiguous axial images were obtained from the base of the skull through the vertex without intravenous contrast.  COMPARISON:  None.  FINDINGS: An 8 mm focus of hyperdensity is present within the right cerebellum with significant surrounding edema. This creates mass effect on the 4th ventricle with midline shift and partial effacement. The basal cisterns are partially effaced as well.  The supratentorial brain is within normal limits. No acute cortical infarct or mass lesion is present. No other hemorrhage is evident. The lateral ventricles are of normal size. No significant extra-axial fluid collection is present.  IMPRESSION: 1. 8 mm hyperdensity in within the right cerebellum concerning for hemorrhage or mass lesion with significant surrounding vasogenic edema. MRI of the brain without and with contrast is recommended for further evaluation. 2. No significant supratentorial lesions are evident. Critical Value/emergent results were called by telephone at the time of interpretation on 11/14/2013 at 4:01 PM to Dr. Blinda Leatherwood , who verbally acknowledged these results.   Electronically Signed   By: Gennette Pac M.D.   On: 11/14/2013 16:01   Mr Laqueta Jean NW Contrast  11/14/2013   CLINICAL DATA:  Dizziness.  Emesis.  EXAM: MRI HEAD WITHOUT AND WITH CONTRAST  TECHNIQUE: Multiplanar, multiecho pulse sequences of the brain and surrounding structures were obtained without and with intravenous contrast.  CONTRAST:  10mL MULTIHANCE GADOBENATE DIMEGLUMINE 529 MG/ML IV SOLN  COMPARISON:  CT head earlier in the day at 1552 hr.  FINDINGS: As suspected from CT, there is a right  cerebellar hemispheric mass with marked surrounding edema. The lesion contains a nonenhancing T2 hypointense component roughly 1 cm in size, best displayed on coronal T2 weighted images. The enhancing portion of the mass is somewhat larger, measuring 16 x 17 x 13 mm, appears infiltrative, with vasogenic edema spreading into the vermis and displacing the 4th ventricle approximately 2 mm right to left. The 4th ventricle is sufficiently compressed the raise concern of impending hydrocephalus should the edema worsen. No other intracranial lesions are seen.  There are no areas of acute stroke, extra-axial fluid, or hydrocephalus. Calvarium intact without osseous lesions. No craniocervical junction or upper cervical abnormality. No sinus or mastoid disease. Negative orbits. Intracranial vasculature widely patent.  Compared with the earlier CT, the appearance is not significantly worsened.  IMPRESSION: 16 x 17 x 13 mm right cerebellar infiltrative mass with marked surrounding edema. Centrally there is a component of predominantly T2 shortening suggesting acute blood or melanin. Calcification/mineralization felt less likely. Metastases which could present with this constellation of imaging findings include melanoma, hemorrhagic lung, breast, renal cell, or thyroid carcinoma, versus a primary cerebellar brain tumor such as glioma. Cerebral abscess  is not favored.  Significant vasogenic edema in the setting of a possibly acutely hemorrhagic mass is concerning. The patient should be carefully observed in a neuro ICU setting for signs of increased intracranial pressure should the lesion worsen due to increasing hemorrhage or obstruction of the 4th ventricle.  Findings discussed with ordering provider at the time of dictation.   Electronically Signed   By: Davonna Belling M.D.   On: 11/14/2013 19:25     Assessment/Plan: Right cerebellar lesion with surrounding edema and potentially some minor hemorrhage within the lesion.  Given the fact that she's a heavy smoker this is a cerebellar metastasis with a lung primary until proven otherwise. This could also be something like melanoma or renal cell carcinoma. Also could be abscess though imaging is not as typical for that. However she did have an infection and a tooth in the last 6 months. A chest abdomen and pelvis CT scan has been ordered. Continue Decadron.   JONES,DAVID S 11/15/2013 8:08 AM

## 2013-11-15 NOTE — Progress Notes (Addendum)
[facility] Progress Note [facility] TEAM 1 - Stepdown/ICU TEAM   [name] [name] ZOX:[id number] DOB: October 24, 1961 DOA: 11/14/2013 PCP: Joycelyn Rua, MD  Brief narrative: This is a 52 year old female with past medical history of smoking who presents with several weeks of progressive headaches and more recently trouble with balance and change in handwriting. She also developed nausea and vomiting and was treated with Phenergan for possible gastroenteritis. Due to progression of symptoms she presented to the ER where a CT scan of her head revealed a right cerebellar mass with vasogenic edema.  Assessment/Plan: Principal Problem:   Cerebellar mass -Started on IV Decadron with noted improvement in symptoms of vertigo, vomiting, headache and gait instability -Further workup for primary revealed spiculated nodule in her left lung-have consulted pulmonary to assess her for biopsy  Active Problems:   Hypothyroid -Continue Synthroid    Tobacco abuse -Continue nicotine patch  TMJ - causes chronic facial pain and headaches - stable    Code Status: full code  Family Communication: with husband Disposition Plan: follow in SDU  Consultants: NS Pulm  Procedures: none  Antibiotics: none  DVT prophylaxis: SCDs- avoid anticoagulants  HPI/Subjective: Pt alert- ambulated to bathroom without significant symptoms- able to eat- discussed finding on CT revealing lung nodule- wants everything done that is possible    Objective: Blood pressure 117/77, pulse 77, temperature 97.8 F (36.6 C), temperature source Oral, resp. rate 12, height 5\' 7"  (1.702 m), weight 53.9 kg (118 lb 13.3 oz), SpO2 99.00%.  Intake/Output Summary (Last 24 hours) at 11/15/13 1748 Last data filed at 11/15/13 1600  Gross per 24 hour  Intake   1550 ml  Output      0 ml  Net   1550 ml     Exam: General: No acute respiratory distress Lungs: Clear to auscultation bilaterally without wheezes or  crackles Cardiovascular: Regular rate and rhythm without murmur gallop or rub normal S1 and S2 Abdomen: Nontender, nondistended, soft, bowel sounds positive, no rebound, no ascites, no appreciable mass Extremities: No significant cyanosis, clubbing, or edema bilateral lower extremities  Data Reviewed: Basic Metabolic Panel:  Recent Labs Lab 11/14/13 1035 11/15/13 0700  NA 139 137  K 3.7 3.5  CL 100 104  CO2 27 23  GLUCOSE 128* 135*  BUN 14 10  CREATININE 0.60 0.51  CALCIUM 10.2 9.2   Liver Function Tests:  Recent Labs Lab 11/15/13 0700  AST 13  ALT 13  ALKPHOS 105  BILITOT 0.4  PROT 6.6  ALBUMIN 3.5   No results found for this basename: LIPASE, AMYLASE,  in the last 168 hours No results found for this basename: AMMONIA,  in the last 168 hours CBC:  Recent Labs Lab 11/14/13 1035 11/15/13 0700  WBC 11.3* 7.3  NEUTROABS 8.8* 6.3  HGB 16.3* 14.9  HCT 46.2* 43.0  MCV 89.7 91.7  PLT 311 298   Cardiac Enzymes: No results found for this basename: CKTOTAL, CKMB, CKMBINDEX, TROPONINI,  in the last 168 hours BNP (last 3 results) No results found for this basename: PROBNP,  in the last 8760 hours CBG: No results found for this basename: GLUCAP,  in the last 168 hours  Recent Results (from the past 240 hour(s))  MRSA PCR SCREENING     Status: None   Collection Time    11/15/13 12:05 AM      Result Value Range Status   MRSA by PCR NEGATIVE  NEGATIVE Final   Comment:  The GeneXpert MRSA Assay (FDA     approved for NASAL specimens     only), is one component of a     comprehensive MRSA colonization     surveillance program. It is not     intended to diagnose MRSA     infection nor to guide or     monitor treatment for     MRSA infections.     Studies:  Recent x-ray studies have been reviewed in detail by the Attending Physician  Scheduled Meds:  Scheduled Meds: . dexamethasone  6 mg Intravenous Q6H  . levothyroxine  125 mcg Oral QAC breakfast   . nicotine  14 mg Transdermal Once  . [START ON 11/16/2013] pantoprazole  40 mg Oral Daily  . [START ON 11/16/2013] pneumococcal 23 valent vaccine  0.5 mL Intramuscular Tomorrow-1000  . sodium chloride  3 mL Intravenous Q12H   Continuous Infusions: . sodium chloride 75 mL/hr at 11/15/13 1600    Time spent on care of this patient: 35 min   Collins Kerby, MD  [facility] Office  [phone] Pager - Text Page per Loretha Stapler as per below:  On-Call/Text Page:      [email or web address]      password [email or web address]  If 7PM-7AM, please contact night-coverage [email or web address] Password [email or web address] 11/15/2013, 5:48 PM   LOS: 1 day

## 2013-11-16 DIAGNOSIS — R911 Solitary pulmonary nodule: Secondary | ICD-10-CM | POA: Diagnosis present

## 2013-11-16 HISTORY — DX: Solitary pulmonary nodule: R91.1

## 2013-11-16 NOTE — Progress Notes (Signed)
Patient ID: [name] [name], female   DOB: 03-Mar-1961, 52 y.o.   MRN: [id number] Afeb, vss No new neuro issues. The CT of body does not show a definite primary neoplasm elsewhere.  Will most likely need crani to remove cerebellar lesion as there is not another obvious place to make a diagnosis.

## 2013-11-16 NOTE — Progress Notes (Signed)
[facility] Progress Note [place] TEAM 1 - Stepdown/ICU TEAM   CAIDEN MONSIVAIS ZOX:[id number] DOB: 10-Oct-1961 DOA: 11/14/2013 PCP: Joycelyn Rua, MD  Brief narrative: This is a 52 year old female with past medical history of smoking who presents with several weeks of progressive headaches and more recently trouble with balance and change in handwriting. She also developed nausea and vomiting and was treated with Phenergan for possible gastroenteritis. Due to progression of symptoms she presented to the ER where a CT scan of her head revealed a right cerebellar mass with vasogenic edema.  Assessment/Plan: Principal Problem:   Cerebellar mass -Started on IV Decadron with noted improvement in symptoms of vertigo, vomiting, headache and gait instability - for surgical removal of mass next week- no primary found  Lung Nodule -Further workup for primary revealed spiculated nodule in her left lung-per pulmonary, we will need to follow this with serial CT scans as oupt  Active Problems:    Hypothyroid -Continue Synthroid    Tobacco abuse -Continue nicotine patch - she states she is quitting smoking   TMJ - causes chronic facial pain  - stable    Code Status: full code  Family Communication: with husband Disposition Plan: transfer to med/surg   Consultants: NS Pulm  Procedures: none  Antibiotics: none  DVT prophylaxis: SCDs- avoid anticoagulants  HPI/Subjective: Pt alert- Neurological symptoms continue to improve- have discussed plan for cranial surgery for removal of mass and f/u of lung nodule.    Objective: Blood pressure 134/73, pulse 86, temperature 97.3 F (36.3 C), temperature source Oral, resp. rate 18, height 5\' 7"  (1.702 m), weight 53.9 kg (118 lb 13.3 oz), SpO2 99.00%.  Intake/Output Summary (Last 24 hours) at 11/16/13 1846 Last data filed at 11/16/13 1100  Gross per 24 hour  Intake      3 ml  Output      0 ml  Net      3 ml      Exam: General: No acute respiratory distress Lungs: Clear to auscultation bilaterally without wheezes or crackles Cardiovascular: Regular rate and rhythm without murmur gallop or rub normal S1 and S2 Abdomen: Nontender, nondistended, soft, bowel sounds positive, no rebound, no ascites, no appreciable mass Extremities: No significant cyanosis, clubbing, or edema bilateral lower extremities  Data Reviewed: Basic Metabolic Panel:  Recent Labs Lab 11/14/13 1035 11/15/13 0700  NA 139 137  K 3.7 3.5  CL 100 104  CO2 27 23  GLUCOSE 128* 135*  BUN 14 10  CREATININE 0.60 0.51  CALCIUM 10.2 9.2   Liver Function Tests:  Recent Labs Lab 11/15/13 0700  AST 13  ALT 13  ALKPHOS 105  BILITOT 0.4  PROT 6.6  ALBUMIN 3.5   No results found for this basename: LIPASE, AMYLASE,  in the last 168 hours No results found for this basename: AMMONIA,  in the last 168 hours CBC:  Recent Labs Lab 11/14/13 1035 11/15/13 0700  WBC 11.3* 7.3  NEUTROABS 8.8* 6.3  HGB 16.3* 14.9  HCT 46.2* 43.0  MCV 89.7 91.7  PLT 311 298   Cardiac Enzymes: No results found for this basename: CKTOTAL, CKMB, CKMBINDEX, TROPONINI,  in the last 168 hours BNP (last 3 results) No results found for this basename: PROBNP,  in the last 8760 hours CBG: No results found for this basename: GLUCAP,  in the last 168 hours  Recent Results (from the past 240 hour(s))  MRSA PCR SCREENING     Status: None   Collection Time  11/15/13 12:05 AM      Result Value Range Status   MRSA by PCR NEGATIVE  NEGATIVE Final   Comment:            The GeneXpert MRSA Assay (FDA     approved for NASAL specimens     only), is one component of a     comprehensive MRSA colonization     surveillance program. It is not     intended to diagnose MRSA     infection nor to guide or     monitor treatment for     MRSA infections.     Studies:  Recent x-ray studies have been reviewed in detail by the Attending  Physician  Scheduled Meds:  Scheduled Meds: . dexamethasone  6 mg Intravenous Q6H  . levothyroxine  125 mcg Oral QAC breakfast  . [COMPLETED] nicotine  14 mg Transdermal Once  . nicotine  14 mg Transdermal Daily  . pantoprazole  40 mg Oral Daily  . sodium chloride  3 mL Intravenous Q12H   Continuous Infusions:    Time spent on care of this patient: 35 min   Calvert Cantor, MD  [facility] Office  [phone] Pager - Text Page per Loretha Stapler as per below:  On-Call/Text Page:      [email or web address]      password [email or web address]  If 7PM-7AM, please contact night-coverage [email or web address] Password [facility] 11/16/2013, 6:46 PM   LOS: 2 days

## 2013-11-17 DIAGNOSIS — R911 Solitary pulmonary nodule: Secondary | ICD-10-CM

## 2013-11-17 MED ORDER — ZOLPIDEM TARTRATE 5 MG PO TABS: 5.0000 mg | ORAL_TABLET | Freq: Every evening | ORAL | Status: DC | PRN

## 2013-11-17 MED ORDER — DEXAMETHASONE SODIUM PHOSPHATE 10 MG/ML IJ SOLN
6.0000 mg | Freq: Four times a day (QID) | INTRAMUSCULAR | Status: DC
  Administered 2013-11-17 – 2013-11-18 (×4): 6 mg via INTRAVENOUS
  Filled 2013-11-17 (×8): qty 0.6

## 2013-11-17 MED ORDER — SENNOSIDES-DOCUSATE SODIUM 8.6-50 MG PO TABS
1.0000 | ORAL_TABLET | Freq: Every day | ORAL | Status: DC
  Administered 2013-11-17 – 2013-11-22 (×4): 1 via ORAL
  Filled 2013-11-17 (×4): qty 1

## 2013-11-17 MED ORDER — DEXAMETHASONE SODIUM PHOSPHATE 10 MG/ML IJ SOLN
6.0000 mg | Freq: Four times a day (QID) | INTRAMUSCULAR | Status: DC
  Filled 2013-11-17 (×4): qty 0.6

## 2013-11-17 NOTE — Progress Notes (Signed)
[facility] Progress Note    [name] [name] ZOX:[id number] DOB: Dec 26, 1960 DOA: 11/14/2013 PCP: Joycelyn Rua, MD  Brief narrative:  52 year old female who is an active smoker  presented with several weeks of progressive headaches and more recently trouble with balance and change in her handwriting. She also developed nausea and vomiting and was treated with Phenergan for possible gastroenteritis. Due to progression of symptoms she presented to the ER where a CT scan of her head revealed a right cerebellar mass with vasogenic edema.  Assessment/Plan: Principal Problem:  right  Cerebellar mass -Started on IV Decadron with noted improvement in symptoms of vertigo, vomiting, headache and gait instability - for surgical removal of mass this week. Neurosurgery following.  Lung Nodule -Further workup for primary revealed spiculated nodule in her left lung. Previous hospitalist discussed with pulmonary who recommended  to follow this with serial CT scans as oupt  Active Problems:    Hypothyroidism -Continue Synthroid    Tobacco abuse -Continue nicotine patch -plans on quitting smoking  TMJ - causes chronic facial pain  - stable    Code Status: full code  Family Communication: none at bedside Disposition Plan :pending craniotomy this week  Consultants: neurosurgery   Procedures: none  Antibiotics: none  DVT prophylaxis: SCDs- avoid anticoagulants  HPI/Subjective: Reports having difficulty sleeping overnight due to anxiety   Objective: Blood pressure 149/70, pulse 67, temperature 97.7 F (36.5 C), temperature source Oral, resp. rate 18, height 5\' 7"  (1.702 m), weight 53.9 kg (118 lb 13.3 oz), SpO2 100.00%.  Intake/Output Summary (Last 24 hours) at 11/17/13 1033 Last data filed at 11/16/13 1900  Gross per 24 hour  Intake    363 ml  Output      0 ml  Net    363 ml     Exam: General: middle aged female in NAD, feels anxious HEENT: no pallor, moist  mucosa Lungs: Clear to auscultation bilaterally without wheezes or crackles Cardiovascular: NS1&S2, no murmurs, rubs or gallop Abdomen: soft, Nontender, nondistended,  bowel sounds present  Extremities: warm, no edema  CNS: AAOX3, no focal deficits  Data Reviewed: Basic Metabolic Panel:  Recent Labs Lab 11/14/13 1035 11/15/13 0700  NA 139 137  K 3.7 3.5  CL 100 104  CO2 27 23  GLUCOSE 128* 135*  BUN 14 10  CREATININE 0.60 0.51  CALCIUM 10.2 9.2   Liver Function Tests:  Recent Labs Lab 11/15/13 0700  AST 13  ALT 13  ALKPHOS 105  BILITOT 0.4  PROT 6.6  ALBUMIN 3.5   No results found for this basename: LIPASE, AMYLASE,  in the last 168 hours No results found for this basename: AMMONIA,  in the last 168 hours CBC:  Recent Labs Lab 11/14/13 1035 11/15/13 0700  WBC 11.3* 7.3  NEUTROABS 8.8* 6.3  HGB 16.3* 14.9  HCT 46.2* 43.0  MCV 89.7 91.7  PLT 311 298   Cardiac Enzymes: No results found for this basename: CKTOTAL, CKMB, CKMBINDEX, TROPONINI,  in the last 168 hours BNP (last 3 results) No results found for this basename: PROBNP,  in the last 8760 hours CBG: No results found for this basename: GLUCAP,  in the last 168 hours  Recent Results (from the past 240 hour(s))  MRSA PCR SCREENING     Status: None   Collection Time    11/15/13 12:05 AM      Result Value Range Status   MRSA by PCR NEGATIVE  NEGATIVE Final   Comment:  The GeneXpert MRSA Assay (FDA     approved for NASAL specimens     only), is one component of a     comprehensive MRSA colonization     surveillance program. It is not     intended to diagnose MRSA     infection nor to guide or     monitor treatment for     MRSA infections.     Studies:  Recent x-ray studies have been reviewed in detail by the Attending Physician  Scheduled Meds:  Scheduled Meds: . dexamethasone  6 mg Intravenous QID  . levothyroxine  125 mcg Oral QAC breakfast  . nicotine  14 mg Transdermal  Daily  . pantoprazole  40 mg Oral Daily  . sodium chloride  3 mL Intravenous Q12H   Continuous Infusions:    Time spent on care of this patient: 25 min   Eddie North, MD  [facility] Pager: [phone] If 7PM-7AM, please contact night-coverage [email or web address] Password [facility] 11/17/2013, 10:33 AM   LOS: 3 days

## 2013-11-18 MED ORDER — DEXAMETHASONE SODIUM PHOSPHATE 4 MG/ML IJ SOLN
4.0000 mg | Freq: Four times a day (QID) | INTRAMUSCULAR | Status: DC
  Administered 2013-11-18 – 2013-11-20 (×9): 4 mg via INTRAVENOUS
  Filled 2013-11-18 (×12): qty 1

## 2013-11-18 MED ORDER — DEXAMETHASONE SODIUM PHOSPHATE 10 MG/ML IJ SOLN
4.0000 mg | Freq: Four times a day (QID) | INTRAMUSCULAR | Status: DC
  Filled 2013-11-18 (×4): qty 0.4

## 2013-11-18 NOTE — Progress Notes (Signed)
Patient ID: [name] [name], female   DOB: 29-Apr-1961, 52 y.o.   MRN: [id number]  Pt doing well, with resolution of headaches and N/V on decadron. Looks great on exam. Had long d/w her regarding the surgery planned for tomorrow. This is either abscess or tumor most likely. Skin survey today shows no obvious melanoma and CT revealed no obvious primary, though small lesion in lung could still be the source. Will get stealth CT tomorrow and do planned surgery. Risks include but are not limited to bleeding, infection, stroke, death, lack of total resection, lack of diagnosis, CSF leak, meningitis, dysmetria, loss of coordination, sinus injury, and anesthesia risks. She understands and wishs to proceed.

## 2013-11-18 NOTE — Progress Notes (Signed)
[facility] Progress Note    [name] [name]:[id number] DOB: [birth year] DOA: 11/14/2013 PCP: Joycelyn Rua, MD  Brief narrative:  52 year old female who is an active smoker  presented with several weeks of progressive headaches and more recently trouble with balance and change in her handwriting. She also developed nausea and vomiting and was treated with Phenergan for possible gastroenteritis. Due to progression of symptoms she presented to the ER where a CT scan of her head revealed a right cerebellar mass with vasogenic edema.  Assessment/Plan: Principal Problem:  right  Cerebellar mass -Started on IV Decadron with noted improvement in symptoms of vertigo, vomiting, headache and gait instability - for surgical removal of mass tomorrow. Neurosurgery following. Will follow surgical bx and get oncology consult thereafter.   Lung Nodule -Further workup for primary revealed spiculated nodule in her left lung. Previous hospitalist discussed with pulmonary who recommended  to follow this with serial CT scans as oupt  Active Problems:    Hypothyroidism -Continue Synthroid    Tobacco abuse -Continue nicotine patch -plans on quitting smoking  TMJ - causes chronic facial pain  - stable    Code Status: full code  Family Communication: none at bedside Disposition Plan :pending craniotomy tomorrow  Consultants: neurosurgery   Procedures: none  Antibiotics: none  DVT prophylaxis: SCDs  HPI/Subjective: No issues overnight   Objective: Blood pressure 129/74, pulse 57, temperature 97.7 F (36.5 C), temperature source Oral, resp. rate 18, height 5\' 7"  (1.702 m), weight 53.9 kg (118 lb 13.3 oz), SpO2 100.00%.  Intake/Output Summary (Last 24 hours) at 11/18/13 0948 Last data filed at 11/17/13 1351  Gross per 24 hour  Intake    480 ml  Output      0 ml  Net    480 ml     Exam: General: middle aged female in NAD HEENT: no pallor, moist mucosa Lungs:  Clear to auscultation bilaterally  Cardiovascular: NS1&S2, no murmurs, rubs or gallop Abdomen: soft, Nontender, nondistended,  bowel sounds present  Extremities: warm, no edema  CNS: AAOX3, no focal deficits  Data Reviewed: Basic Metabolic Panel:  Recent Labs Lab 11/14/13 1035 11/15/13 0700  NA 139 137  K 3.7 3.5  CL 100 104  CO2 27 23  GLUCOSE 128* 135*  BUN 14 10  CREATININE 0.60 0.51  CALCIUM 10.2 9.2   Liver Function Tests:  Recent Labs Lab 11/15/13 0700  AST 13  ALT 13  ALKPHOS 105  BILITOT 0.4  PROT 6.6  ALBUMIN 3.5   No results found for this basename: LIPASE, AMYLASE,  in the last 168 hours No results found for this basename: AMMONIA,  in the last 168 hours CBC:  Recent Labs Lab 11/14/13 1035 11/15/13 0700  WBC 11.3* 7.3  NEUTROABS 8.8* 6.3  HGB 16.3* 14.9  HCT 46.2* 43.0  MCV 89.7 91.7  PLT 311 298   Cardiac Enzymes: No results found for this basename: CKTOTAL, CKMB, CKMBINDEX, TROPONINI,  in the last 168 hours BNP (last 3 results) No results found for this basename: PROBNP,  in the last 8760 hours CBG: No results found for this basename: GLUCAP,  in the last 168 hours  Recent Results (from the past 240 hour(s))  MRSA PCR SCREENING     Status: None   Collection Time    11/15/13 12:05 AM      Result Value Range Status   MRSA by PCR NEGATIVE  NEGATIVE Final   Comment:  The GeneXpert MRSA Assay (FDA     approved for NASAL specimens     only), is one component of a     comprehensive MRSA colonization     surveillance program. It is not     intended to diagnose MRSA     infection nor to guide or     monitor treatment for     MRSA infections.     Studies:  Recent x-ray studies have been reviewed in detail by the Attending Physician  Scheduled Meds:  Scheduled Meds: . dexamethasone  4 mg Intravenous Q6H  . levothyroxine  125 mcg Oral QAC breakfast  . nicotine  14 mg Transdermal Daily  . pantoprazole  40 mg Oral Daily  .  senna-docusate  1 tablet Oral Daily  . sodium chloride  3 mL Intravenous Q12H   Continuous Infusions:    Time spent on care of this patient: 25 min   Eddie North, MD  [facility] Pager: [phone] If 7PM-7AM, please contact night-coverage [email or web address] Password [facility] 11/18/2013, 9:48 AM   LOS: 4 days

## 2013-11-19 ENCOUNTER — Encounter (HOSPITAL_COMMUNITY)
Admission: EM | Disposition: A | Discharge status: Home / Self Care | Payer: Self-pay | Source: Home / Self Care | Attending: Neurological Surgery

## 2013-11-19 ENCOUNTER — Inpatient Hospital Stay (HOSPITAL_COMMUNITY): Payer: 59 | Admitting: Anesthesiology

## 2013-11-19 ENCOUNTER — Encounter (HOSPITAL_COMMUNITY): Payer: Self-pay | Admitting: *Deleted

## 2013-11-19 ENCOUNTER — Inpatient Hospital Stay (HOSPITAL_COMMUNITY): Payer: 59

## 2013-11-19 ENCOUNTER — Encounter (HOSPITAL_COMMUNITY): Payer: 59 | Admitting: Anesthesiology

## 2013-11-19 DIAGNOSIS — Z9889 Other specified postprocedural states: Secondary | ICD-10-CM

## 2013-11-19 HISTORY — PX: SUBOCCIPITAL CRANIECTOMY CERVICAL LAMINECTOMY: SHX5404

## 2013-11-19 HISTORY — DX: Other specified postprocedural states: Z98.890

## 2013-11-19 LAB — PREPARE RBC (CROSSMATCH)

## 2013-11-19 SURGERY — SUBOCCIPITAL CRANIECTOMY CERVICAL LAMINECTOMY/DURAPLASTY
Anesthesia: General

## 2013-11-19 MED ORDER — VECURONIUM BROMIDE 10 MG IV SOLR
INTRAVENOUS | Status: DC | PRN
  Administered 2013-11-19: 2 mg via INTRAVENOUS

## 2013-11-19 MED ORDER — THROMBIN 20000 UNITS EX SOLR
CUTANEOUS | Status: DC | PRN
  Administered 2013-11-19: 15:00:00 via TOPICAL

## 2013-11-19 MED ORDER — NEOSTIGMINE METHYLSULFATE 1 MG/ML IJ SOLN
INTRAMUSCULAR | Status: DC | PRN
  Administered 2013-11-19: 5 mg via INTRAVENOUS

## 2013-11-19 MED ORDER — POTASSIUM CHLORIDE IN NACL 20-0.9 MEQ/L-% IV SOLN
INTRAVENOUS | Status: DC
  Administered 2013-11-19: 18:00:00 via INTRAVENOUS
  Filled 2013-11-19 (×5): qty 1000

## 2013-11-19 MED ORDER — ROCURONIUM BROMIDE 100 MG/10ML IV SOLN
INTRAVENOUS | Status: DC | PRN
  Administered 2013-11-19: 50 mg via INTRAVENOUS

## 2013-11-19 MED ORDER — OXYCODONE HCL 5 MG/5ML PO SOLN: 5.0000 mg | Freq: Once | ORAL | Status: DC | PRN

## 2013-11-19 MED ORDER — MEPERIDINE HCL 25 MG/ML IJ SOLN: 6.2500 mg | INTRAMUSCULAR | Status: DC | PRN

## 2013-11-19 MED ORDER — HYDRALAZINE HCL 20 MG/ML IJ SOLN
INTRAMUSCULAR | Status: DC | PRN
  Administered 2013-11-19 (×4): 5 mg via INTRAVENOUS

## 2013-11-19 MED ORDER — PROPOFOL 10 MG/ML IV BOLUS
INTRAVENOUS | Status: DC | PRN
  Administered 2013-11-19: 50 mg via INTRAVENOUS
  Administered 2013-11-19: 130 mg via INTRAVENOUS

## 2013-11-19 MED ORDER — BUPIVACAINE HCL (PF) 0.25 % IJ SOLN
INTRAMUSCULAR | Status: DC | PRN
  Administered 2013-11-19: 10 mL

## 2013-11-19 MED ORDER — THROMBIN 5000 UNITS EX SOLR
OROMUCOSAL | Status: DC | PRN
  Administered 2013-11-19: 15:00:00 via TOPICAL

## 2013-11-19 MED ORDER — OXYCODONE HCL 5 MG PO TABS: 5.0000 mg | ORAL_TABLET | Freq: Once | ORAL | Status: DC | PRN

## 2013-11-19 MED ORDER — LABETALOL HCL 5 MG/ML IV SOLN
10.0000 mg | INTRAVENOUS | Status: DC | PRN
  Filled 2013-11-19: qty 8

## 2013-11-19 MED ORDER — HYDROMORPHONE HCL PF 1 MG/ML IJ SOLN
0.2500 mg | INTRAMUSCULAR | Status: DC | PRN
  Administered 2013-11-19 (×2): 0.5 mg via INTRAVENOUS

## 2013-11-19 MED ORDER — LIDOCAINE HCL (CARDIAC) 20 MG/ML IV SOLN
INTRAVENOUS | Status: DC | PRN
  Administered 2013-11-19: 50 mg via INTRAVENOUS

## 2013-11-19 MED ORDER — ARTIFICIAL TEARS OP OINT
TOPICAL_OINTMENT | OPHTHALMIC | Status: DC | PRN
  Administered 2013-11-19: 1 via OPHTHALMIC

## 2013-11-19 MED ORDER — MICROFIBRILLAR COLL HEMOSTAT EX PADS
MEDICATED_PAD | CUTANEOUS | Status: DC | PRN
  Administered 2013-11-19: 1 via TOPICAL

## 2013-11-19 MED ORDER — PROMETHAZINE HCL 25 MG/ML IJ SOLN: 6.2500 mg | INTRAMUSCULAR | Status: DC | PRN

## 2013-11-19 MED ORDER — NITROGLYCERIN IN D5W 200-5 MCG/ML-% IV SOLN
INTRAVENOUS | Status: DC | PRN
  Administered 2013-11-19: 20 ug/min via INTRAVENOUS

## 2013-11-19 MED ORDER — SODIUM CHLORIDE 0.9 % IV SOLN
INTRAVENOUS | Status: DC | PRN
  Administered 2013-11-19 (×2): via INTRAVENOUS

## 2013-11-19 MED ORDER — BISACODYL 10 MG RE SUPP
10.0000 mg | Freq: Once | RECTAL | Status: AC
  Administered 2013-11-19: 10 mg via RECTAL

## 2013-11-19 MED ORDER — FENTANYL CITRATE 0.05 MG/ML IJ SOLN
INTRAMUSCULAR | Status: DC | PRN
  Administered 2013-11-19: 250 ug via INTRAVENOUS
  Administered 2013-11-19: 100 ug via INTRAVENOUS
  Administered 2013-11-19: 50 ug via INTRAVENOUS

## 2013-11-19 MED ORDER — HYDROMORPHONE HCL PF 1 MG/ML IJ SOLN
INTRAMUSCULAR | Status: AC
  Filled 2013-11-19: qty 1

## 2013-11-19 MED ORDER — 0.9 % SODIUM CHLORIDE (POUR BTL) OPTIME
TOPICAL | Status: DC | PRN
  Administered 2013-11-19 (×2): 1000 mL

## 2013-11-19 MED ORDER — IOHEXOL 300 MG/ML  SOLN
75.0000 mL | Freq: Once | INTRAMUSCULAR | Status: AC | PRN
  Administered 2013-11-19: 75 mL via INTRAVENOUS

## 2013-11-19 MED ORDER — ONDANSETRON HCL 4 MG/2ML IJ SOLN
4.0000 mg | INTRAMUSCULAR | Status: DC | PRN
  Administered 2013-11-19: 4 mg via INTRAVENOUS
  Filled 2013-11-19: qty 2

## 2013-11-19 MED ORDER — ONDANSETRON HCL 4 MG/2ML IJ SOLN
INTRAMUSCULAR | Status: DC | PRN
  Administered 2013-11-19: 4 mg via INTRAVENOUS

## 2013-11-19 MED ORDER — MORPHINE SULFATE 2 MG/ML IJ SOLN
1.0000 mg | INTRAMUSCULAR | Status: DC | PRN
  Administered 2013-11-19: 2 mg via INTRAVENOUS
  Filled 2013-11-19: qty 1

## 2013-11-19 MED ORDER — METOPROLOL TARTRATE 1 MG/ML IV SOLN
INTRAVENOUS | Status: DC | PRN
  Administered 2013-11-19 (×2): 2.5 mg via INTRAVENOUS

## 2013-11-19 MED ORDER — SODIUM CHLORIDE 0.9 % IR SOLN
Status: DC | PRN
  Administered 2013-11-19: 15:00:00

## 2013-11-19 MED ORDER — PROMETHAZINE HCL 25 MG PO TABS: 12.5000 mg | ORAL_TABLET | ORAL | Status: DC | PRN

## 2013-11-19 MED ORDER — PANTOPRAZOLE SODIUM 40 MG IV SOLR
40.0000 mg | Freq: Every day | INTRAVENOUS | Status: DC
  Administered 2013-11-19 – 2013-11-20 (×2): 40 mg via INTRAVENOUS
  Filled 2013-11-19 (×3): qty 40

## 2013-11-19 MED ORDER — CEFAZOLIN SODIUM-DEXTROSE 2-3 GM-% IV SOLR
INTRAVENOUS | Status: AC
  Administered 2013-11-19: 2 g via INTRAVENOUS
  Filled 2013-11-19: qty 50

## 2013-11-19 MED ORDER — ONDANSETRON HCL 4 MG PO TABS: 4.0000 mg | ORAL_TABLET | ORAL | Status: DC | PRN

## 2013-11-19 MED ORDER — BACITRACIN ZINC 500 UNIT/GM EX OINT
TOPICAL_OINTMENT | CUTANEOUS | Status: DC | PRN
  Administered 2013-11-19: 1 via TOPICAL

## 2013-11-19 MED ORDER — SODIUM CHLORIDE 0.9 % IV SOLN
INTRAVENOUS | Status: DC | PRN
  Administered 2013-11-19: 14:00:00 via INTRAVENOUS

## 2013-11-19 MED ORDER — GLYCOPYRROLATE 0.2 MG/ML IJ SOLN
INTRAMUSCULAR | Status: DC | PRN
  Administered 2013-11-19: .8 mg via INTRAVENOUS

## 2013-11-19 SURGICAL SUPPLY — 66 items
BAG DECANTER FOR FLEXI CONT (MISCELLANEOUS) ×2 IMPLANT
BLADE CLIPPER SURG NEURO (BLADE) IMPLANT
BRUSH SCRUB EZ PLAIN DRY (MISCELLANEOUS) ×2 IMPLANT
BUR ACORN 9.0 PRECISION (BURR) ×2 IMPLANT
BUR MATCHSTICK NEURO 3.0 LAGG (BURR) IMPLANT
CANISTER SUCT 3000ML (MISCELLANEOUS) ×4 IMPLANT
CATH ROBINSON RED A/P 10FR (CATHETERS) ×2 IMPLANT
CATH VENTRIC 35X38 W/TROCAR LG (CATHETERS) ×2 IMPLANT
CLIP TI MEDIUM 6 (CLIP) IMPLANT
CONT SPEC 4OZ CLIKSEAL STRL BL (MISCELLANEOUS) ×2 IMPLANT
CORDS BIPOLAR (ELECTRODE) ×2 IMPLANT
DRAPE LAPAROTOMY 100X72 PEDS (DRAPES) ×2 IMPLANT
DRAPE MICROSCOPE ZEISS OPMI (DRAPES) IMPLANT
DRAPE POUCH INSTRU U-SHP 10X18 (DRAPES) ×2 IMPLANT
DRAPE WARM FLUID 44X44 (DRAPE) ×2 IMPLANT
DRESSING TELFA 8X3 (GAUZE/BANDAGES/DRESSINGS) ×2 IMPLANT
DRSG OPSITE 4X5.5 SM (GAUZE/BANDAGES/DRESSINGS) ×4 IMPLANT
DRSG OPSITE POSTOP 3X4 (GAUZE/BANDAGES/DRESSINGS) ×4 IMPLANT
DURAFORM COLLAGEN 1X1 5-PACK (Neuro Prosthesis/Implant) ×2 IMPLANT
DURASEAL APPLICATOR TIP (TIP) ×2 IMPLANT
DURASEAL SPINE SEALANT 3ML (MISCELLANEOUS) ×2 IMPLANT
ELECT CAUTERY BLADE 6.4 (BLADE) ×2 IMPLANT
ELECT REM PT RETURN 9FT ADLT (ELECTROSURGICAL) ×2
ELECTRODE REM PT RTRN 9FT ADLT (ELECTROSURGICAL) ×1 IMPLANT
FORCEPS BIPOLAR SPETZLER 8 1.0 (NEUROSURGERY SUPPLIES) ×2 IMPLANT
GAUZE SPONGE 4X4 16PLY XRAY LF (GAUZE/BANDAGES/DRESSINGS) IMPLANT
GLOVE BIO SURGEON STRL SZ8 (GLOVE) ×2 IMPLANT
GLOVE BIOGEL PI IND STRL 8 (GLOVE) ×2 IMPLANT
GLOVE BIOGEL PI INDICATOR 8 (GLOVE) ×2
GLOVE ECLIPSE 6.5 STRL STRAW (GLOVE) ×2 IMPLANT
GLOVE ECLIPSE 7.5 STRL STRAW (GLOVE) ×6 IMPLANT
GOWN BRE IMP SLV AUR LG STRL (GOWN DISPOSABLE) ×2 IMPLANT
GOWN BRE IMP SLV AUR XL STRL (GOWN DISPOSABLE) ×2 IMPLANT
GOWN STRL REIN 2XL LVL4 (GOWN DISPOSABLE) ×2 IMPLANT
HEMOSTAT POWDER KIT SURGIFOAM (HEMOSTASIS) IMPLANT
HOOK DURA (MISCELLANEOUS) ×2 IMPLANT
KIT BASIN OR (CUSTOM PROCEDURE TRAY) ×2 IMPLANT
KIT NEEDLE BIOPSY PASSIVE (NEEDLE) ×2 IMPLANT
KIT ROOM TURNOVER OR (KITS) ×2 IMPLANT
MARKER SPHERE PSV REFLC NDI (MISCELLANEOUS) ×4 IMPLANT
NEEDLE HYPO 22GX1.5 SAFETY (NEEDLE) ×2 IMPLANT
NS IRRIG 1000ML POUR BTL (IV SOLUTION) ×2 IMPLANT
PACK LAMINECTOMY NEURO (CUSTOM PROCEDURE TRAY) ×2 IMPLANT
PAD ARMBOARD 7.5X6 YLW CONV (MISCELLANEOUS) ×6 IMPLANT
PAD EYE OVAL STERILE LF (GAUZE/BANDAGES/DRESSINGS) IMPLANT
PATTIES SURGICAL .25X.25 (GAUZE/BANDAGES/DRESSINGS) IMPLANT
PATTIES SURGICAL .5 X.5 (GAUZE/BANDAGES/DRESSINGS) IMPLANT
PATTIES SURGICAL .5 X3 (DISPOSABLE) IMPLANT
PATTIES SURGICAL 1X1 (DISPOSABLE) IMPLANT
PIN MAYFIELD SKULL DISP (PIN) IMPLANT
RUBBERBAND STERILE (MISCELLANEOUS) IMPLANT
SPONGE GAUZE 4X4 12PLY (GAUZE/BANDAGES/DRESSINGS) ×2 IMPLANT
SPONGE NEURO XRAY DETECT 1X3 (DISPOSABLE) IMPLANT
STAPLER VISISTAT 35W (STAPLE) IMPLANT
SUT ETHILON 3 0 FSL (SUTURE) ×2 IMPLANT
SUT NURALON 4 0 TR CR/8 (SUTURE) ×4 IMPLANT
SUT VIC AB 0 CT1 18XCR BRD8 (SUTURE) ×2 IMPLANT
SUT VIC AB 0 CT1 8-18 (SUTURE) ×2
SUT VIC AB 2-0 CP2 18 (SUTURE) ×2 IMPLANT
SUT VIC AB 3-0 SH 8-18 (SUTURE) ×2 IMPLANT
SYR CONTROL 10ML LL (SYRINGE) ×2 IMPLANT
TOWEL OR 17X24 6PK STRL BLUE (TOWEL DISPOSABLE) ×2 IMPLANT
TOWEL OR 17X26 10 PK STRL BLUE (TOWEL DISPOSABLE) ×2 IMPLANT
TRAY FOLEY CATH 14FRSI W/METER (CATHETERS) IMPLANT
UNDERPAD 30X30 INCONTINENT (UNDERPADS AND DIAPERS) IMPLANT
WATER STERILE IRR 1000ML POUR (IV SOLUTION) ×2 IMPLANT

## 2013-11-19 NOTE — Plan of Care (Signed)
Problem: Consults Goal: Diagnosis - Craniotomy Right subdural mass

## 2013-11-19 NOTE — Op Note (Signed)
11/14/2013 - 11/19/2013  4:37 PM  PATIENT:  [name] [name]  52 y.o. female  PRE-OPERATIVE DIAGNOSIS:  Right cerebellar mass  POST-OPERATIVE DIAGNOSIS:  Same, initial pathology consistent with metastasis  PROCEDURE:  Right suboccipital craniectomy for removal of right cerebellar mass utilizing frameless stereotactic stealth guidance  SURGEON:  Marikay Alar, MD  ASSISTANTS: Dr. Franky Macho  ANESTHESIA:   General  EBL: 200 ml  Total I/O In: 1000 [I.V.:1000] Out: 1150 [Urine:950; Blood:200]  BLOOD ADMINISTERED:none  DRAINS: None   SPECIMEN:  Excision  INDICATION FOR PROCEDURE: This patient presented with severe headaches and dizziness. PT and MRI showed a right cerebellar lesion with surrounding edema. I recommended a craniectomy for resection of the lesion achieve diagnosis and cytoreduction. Patient understood the risks, benefits, and alternatives and potential outcomes and wished to proceed.  PROCEDURE DETAILS: The patient was brought to the operating room. Generalized endotracheal anesthesia was induced. The patient was affixed a 3 point [place] headrest and rolled into the prone position on chest rolls. All pressure points were padded. We registered our fiducials for Stealth frameless tear tapped a guidance and determined our entry point and trajectory to the mass. The posterior cervical region and suboccipital region was cleaned and prepped with DuraPrep and then draped in the usual sterile fashion. 7 cc of local anesthesia was injected and a dorsal midline incision made in the suboccipital and posterior cervical region and carried down to the fascia. The fascia was opened and the paraspinous musculature was taken down to expose the right suboccipital region. The high-speed drill and 4 mm Kerrison punch were then used to perform a craniectomy in the right suboccipital region. I then used the stealth probe to determine the trajectory and the dura was opened. A corticectomy was created  and dissected in the trajectory down about 3 cm deep, stopping to check the trajectory often with the probe. Then found a small firm lesion with a surrounding gliotic plane. Dissection around this lesion and removed it en bloc and sent it for pathology. Solid she came back consistent with metastatic disease. Then dried the surgical bed with bipolar cautery and with Surgifoam. Irrigated this away. I then closed the dura with interrupted 4-0 Nurolon sutures.  I irrigated with saline solution containing bacitracin. I  lined the dura with DuraGen and Gelfoam, and then covered this with Tisseel fibrin glue. After hemostasis was achieved I closed the muscle and the fascia with 0 Vicryl, subcutaneous tissue with 2-0 Vicryl, and the subcuticular tissue with 3-0 Vicryl. The skin was closed with a running 4-0 Ethilon suture.  A sterile dressing was applied, the patient was turned to the supine position and taken out of the headrest, awakened from general anesthesia and transferred to the recovery room in stable condition. At the end of the procedure all sponge, needle and instrument counts were correct.  PLAN OF CARE: Admit to inpatient   PATIENT DISPOSITION:  PACU - hemodynamically stable.   Delay start of Pharmacological VTE agent (>24hrs) due to surgical blood loss or risk of bleeding:  yes

## 2013-11-19 NOTE — Anesthesia Preprocedure Evaluation (Addendum)
Anesthesia Evaluation  Patient identified by MRN, date of birth, ID band Patient awake    Reviewed: Allergy & Precautions, H&P , NPO status , Patient's Chart, lab work & pertinent test results  History of Anesthesia Complications Negative for: history of anesthetic complications  Airway Mallampati: II TM Distance: >3 FB Neck ROM: Full    Dental  (+) Caps, Poor Dentition, Dental Advisory Given and Chipped   Pulmonary Current Smoker,  Lung nodule breath sounds clear to auscultation        Cardiovascular negative cardio ROS  Rhythm:Regular Rate:Normal     Neuro/Psych N/v with current head mass    GI/Hepatic negative GI ROS, Neg liver ROS,   Endo/Other  Diabetes: glu 135.Hypothyroidism   Renal/GU negative Renal ROS     Musculoskeletal   Abdominal   Peds  Hematology negative hematology ROS (+)   Anesthesia Other Findings   Reproductive/Obstetrics                          Anesthesia Physical Anesthesia Plan  ASA: III  Anesthesia Plan: General   Post-op Pain Management:    Induction: Intravenous  Airway Management Planned: Oral ETT  Additional Equipment: Arterial line  Intra-op Plan:   Post-operative Plan: Possible Post-op intubation/ventilation  Informed Consent: I have reviewed the patients History and Physical, chart, labs and discussed the procedure including the risks, benefits and alternatives for the proposed anesthesia with the patient or authorized representative who has indicated his/her understanding and acceptance.   Dental advisory given  Plan Discussed with: Surgeon and CRNA  Anesthesia Plan Comments: (Plan routine monitors, A line, GETA)        Anesthesia Quick Evaluation

## 2013-11-19 NOTE — Anesthesia Postprocedure Evaluation (Signed)
  Anesthesia Post-op Note  Patient: [name] [name]  Procedure(s) Performed: Procedure(s): SUBOCCIPITAL CRANIECTOMY for tumor (N/A)  Patient Location: PACU  Anesthesia Type:General  Level of Consciousness: awake, alert , oriented and patient cooperative  Airway and Oxygen Therapy: Patient Spontanous Breathing and Patient connected to nasal cannula oxygen  Post-op Pain: none  Post-op Assessment: Post-op Vital signs reviewed, Patient's Cardiovascular Status Stable, Respiratory Function Stable, Patent Airway, No signs of Nausea or vomiting and Pain level controlled  Post-op Vital Signs: Reviewed and stable  Complications: No apparent anesthesia complications

## 2013-11-19 NOTE — Progress Notes (Signed)
[facility] PROGRESS NOTE  [name] [name] ZOX:[id number] DOB: 1961-02-09 DOA: 11/14/2013 PCP: [name] Rua, MD  Brief narrative:  52 year old female who is an active smoker presented with several weeks of progressive headaches and more recently trouble with balance and change in her handwriting. She also developed nausea and vomiting and was treated with Phenergan for possible gastroenteritis. Due to progression of symptoms she presented to the ER where a CT scan of her head revealed a right cerebellar mass with vasogenic edema.   Assessment/Plan:  Principal Problem:  right Cerebellar mass  -Started on IV Decadron with noted improvement in symptoms of vertigo, vomiting, headache and gait instability  - for craniotomy today. . Neurosurgery following. Will follow surgical bx and get oncology consult thereafter.   Lung Nodule  -Further workup for primary revealed spiculated nodule in her left lung. Previous hospitalist discussed with pulmonary who recommended to follow this with serial CT scans as oupt   Active Problems:  Hypothyroidism  -Continue Synthroid   Tobacco abuse  -Continue nicotine patch  -plans on quitting smoking   TMJ  - causes chronic facial pain  - stable   Code Status: full code  Family Communication: none at bedside  Disposition Plan :pending   Consultants:  neurosurgery   Procedures:  none   Antibiotics:  none   DVT prophylaxis:  SCDs   HPI/Subjective:  No issues overnight     Objective: Filed Vitals:   11/19/13 1054  BP: 141/82  Pulse: 62  Temp: 97.7 F (36.5 C)  Resp: 18   No intake or output data in the 24 hours ending 11/19/13 1146 Filed Weights   11/15/13 0000  Weight: 53.9 kg (118 lb 13.3 oz)    Exam: General: middle aged female in NAD  HEENT: no pallor, moist mucosa  Lungs: Clear to auscultation bilaterally  Cardiovascular: NS1&S2, no murmurs, rubs or gallop  Abdomen: soft, Nontender, nondistended, bowel sounds  present  Extremities: warm, no edema  CNS: AAOX3, no focal deficits   Data Reviewed: Basic Metabolic Panel:  Recent Labs Lab 11/14/13 1035 11/15/13 0700  NA 139 137  K 3.7 3.5  CL 100 104  CO2 27 23  GLUCOSE 128* 135*  BUN 14 10  CREATININE 0.60 0.51  CALCIUM 10.2 9.2   Liver Function Tests:  Recent Labs Lab 11/15/13 0700  AST 13  ALT 13  ALKPHOS 105  BILITOT 0.4  PROT 6.6  ALBUMIN 3.5   No results found for this basename: LIPASE, AMYLASE,  in the last 168 hours No results found for this basename: AMMONIA,  in the last 168 hours CBC:  Recent Labs Lab 11/14/13 1035 11/15/13 0700  WBC 11.3* 7.3  NEUTROABS 8.8* 6.3  HGB 16.3* 14.9  HCT 46.2* 43.0  MCV 89.7 91.7  PLT 311 298   Cardiac Enzymes: No results found for this basename: CKTOTAL, CKMB, CKMBINDEX, TROPONINI,  in the last 168 hours BNP (last 3 results) No results found for this basename: PROBNP,  in the last 8760 hours CBG: No results found for this basename: GLUCAP,  in the last 168 hours  Recent Results (from the past 240 hour(s))  MRSA PCR SCREENING     Status: None   Collection Time    11/15/13 12:05 AM      Result Value Range Status   MRSA by PCR NEGATIVE  NEGATIVE Final   Comment:            The GeneXpert MRSA Assay (FDA  approved for NASAL specimens     only), is one component of a     comprehensive MRSA colonization     surveillance program. It is not     intended to diagnose MRSA     infection nor to guide or     monitor treatment for     MRSA infections.     Studies: Ct Head W Contrast  11/19/2013   CLINICAL DATA:  52 year old female with cerebellar mass. Study for stereotactic surgical planning requested. Initial encounter.  EXAM: CT HEAD WITH CONTRAST  TECHNIQUE: Contiguous axial images were obtained from the base of the skull through the vertex with intravenous contrast.  CONTRAST:  75mL OMNIPAQUE IOHEXOL 300 MG/ML  SOLN  COMPARISON:  Brain MRI 11/14/2013.  FINDINGS:  Decreased size of the round enhancing component of the right cerebellar mass since 11/14/2013. This now measures 10-11 mm diameter, previously 17 mm). See series 2, images 37 and 38. Likewise, hypodensity in the right cerebellum appears mildly decreased along with improved patency of the 4th ventricle suggesting interval regression of cerebellar edema.  No other abnormal enhancement identified in the brain. Stable supratentorial structures. No ventriculomegaly. Major intracranial vascular structures appear to be normally enhancing. There are multiple scalp soft tissue markers in place. Visualized orbit soft tissues are within normal limits. Visualized paranasal sinuses and mastoids are clear. No acute osseous abnormality identified.  IMPRESSION: 1. Study for stereotactic surgical planning. Enhancing component of the right cerebellar lesion has mildly decreased, now 10-11 mm diameter on series 2 images 37 and 38.  2. Mild regression of cerebellar edema and improved patency of the 4th ventricle.   Electronically Signed   By: Augusto Gamble M.D.   On: 11/19/2013 11:37    Scheduled Meds: . dexamethasone  4 mg Intravenous Q6H  . levothyroxine  125 mcg Oral QAC breakfast  . nicotine  14 mg Transdermal Daily  . pantoprazole  40 mg Oral Daily  . senna-docusate  1 tablet Oral Daily  . sodium chloride  3 mL Intravenous Q12H   Continuous Infusions:     Time spent: 25 minutes    [name]  [facility] Pager [phone]. If 7PM-7AM, please contact night-coverage at [email or web address], password [facility] 11/19/2013, 11:46 AM  LOS: 5 days

## 2013-11-19 NOTE — Transfer of Care (Signed)
Immediate Anesthesia Transfer of Care Note  Patient: [name] [name]  Procedure(s) Performed: Procedure(s): SUBOCCIPITAL CRANIECTOMY for tumor (N/A)  Patient Location: PACU  Anesthesia Type:General  Level of Consciousness: awake, alert , oriented and patient cooperative  Airway & Oxygen Therapy: Patient Spontanous Breathing  Post-op Assessment: Report given to PACU RN, Post -op Vital signs reviewed and stable, Patient moving all extremities X 4 and Patient able to stick tongue midline  Post vital signs: Reviewed and stable  Complications: No apparent anesthesia complications

## 2013-11-19 NOTE — Preoperative (Signed)
Beta Blockers   Reason not to administer Beta Blockers:Not Applicable 

## 2013-11-19 NOTE — Progress Notes (Signed)
Patient ID: [name] [name], female   DOB: [birth year], 52 y.o.   MRN: [id number] She is to undergo a right suboccipital resection of cerebellar mass. She is eager to move forward and understands the risk of the surgery include but are not limited to bleeding, infection, stroke, vascular injury, brain stem injury, numbness weakness paralysis, loss of vision, loss of coordination, lack of diagnosis, incomplete resection, possible need for further surgery, and anesthesia risk including an MI pneumonia and death.

## 2013-11-20 DIAGNOSIS — Z85820 Personal history of malignant melanoma of skin: Secondary | ICD-10-CM | POA: Insufficient documentation

## 2013-11-20 DIAGNOSIS — C439 Malignant melanoma of skin, unspecified: Secondary | ICD-10-CM | POA: Insufficient documentation

## 2013-11-20 MED ORDER — DEXAMETHASONE SODIUM PHOSPHATE 4 MG/ML IJ SOLN: 4.0000 mg | Freq: Four times a day (QID) | INTRAMUSCULAR | Status: DC

## 2013-11-20 MED ORDER — DEXAMETHASONE 4 MG PO TABS
4.0000 mg | ORAL_TABLET | Freq: Four times a day (QID) | ORAL | Status: DC
  Administered 2013-11-20 – 2013-11-22 (×9): 4 mg via ORAL
  Filled 2013-11-20 (×11): qty 1

## 2013-11-20 NOTE — Progress Notes (Signed)
Patient ID: [name] [name], female   DOB: [birth year], 52 y.o.   MRN: [id number] Doing very well. Appropriate soreness, no real headache. No visual changes. MAE well, awake/ alert, dressing dry.

## 2013-11-20 NOTE — Progress Notes (Signed)
Occupational Therapy Evaluation Patient Details Name: [name] [name] MRN: [id number] DOB: Jan 20, 1961 Today's Date: 11/20/2013 Time: 4098-1191 OT Time Calculation (min): 16 min  OT Assessment / Plan / Recommendation History of present illness Pt s/p cerebellar tumor resection   Clinical Impression   Pt making excellent progress. No apparent dysmetria noted. Pt appears to be functioning at independent level with ADL and mobility for ADL. Rec for pt to use shower seat for safety. Husband can help as needed. No OT needed.     OT Assessment  Patient does not need any further OT services    Follow Up Recommendations  No OT follow up    Barriers to Discharge      Equipment Recommendations  None recommended by OT    Recommendations for Other Services    Frequency       Precautions / Restrictions Precautions Precautions: None   Pertinent Vitals/Pain Vitals stable    ADL  Transfers/Ambulation Related to ADLs: mod I ADL Comments: Pt overall independent with all ADL    OT Diagnosis:    OT Problem List:   OT Treatment Interventions:     OT Goals(Current goals can be found in the care plan section) Acute Rehab OT Goals Patient Stated Goal: beat this and get out of here OT Goal Formulation:  (eval only)  Visit Information  Last OT Received On: 11/20/13 Assistance Needed: +1 History of Present Illness: Pt s/p cerebellar tumor resection       Prior Functioning     Home Living Family/patient expects to be discharged to:: Private residence Living Arrangements: Spouse/significant other Available Help at Discharge: Family;Available 24 hours/day Type of Home: House Home Access: Stairs to enter [employer] of Steps: 1 (stoop) Entrance Stairs-Rails: None Home Layout: One level Home Equipment: Walker - 2 wheels;Walker - 4 wheels;Cane - single point;Shower seat Prior Function Level of Independence: Independent Comments: works from home  (husband is on  disability) Communication Communication: No difficulties Dominant Hand: Right         Vision/Perception Vision - History Baseline Vision: Wears glasses all the time Patient Visual Report: No change from baseline Vision - Assessment Eye Alignment: Within Functional Limits Perception Perception: Within Functional Limits Praxis Praxis: Intact   Cognition  Cognition Arousal/Alertness: Awake/alert Behavior During Therapy: WFL for tasks assessed/performed Overall Cognitive Status: Within Functional Limits for tasks assessed    Extremity/Trunk Assessment Upper Extremity Assessment Upper Extremity Assessment: Overall WFL for tasks assessed Lower Extremity Assessment Lower Extremity Assessment: Overall WFL for tasks assessed Cervical / Trunk Assessment Cervical / Trunk Assessment: Normal     Mobility Bed Mobility Bed Mobility: Supine to Sit Supine to Sit: 5: Supervision Details for Bed Mobility Assistance: HOB elevated Transfers Transfers: Sit to Stand;Stand to Sit Sit to Stand: 7: Independent Stand to Sit: 7: Independent Details for Transfer Assistance: v/c's for safety     Exercise     Balance Balance Balance Assessed: Yes (WFL for ADL) High Level Balance High Level Balance Activites:  (tandem walking) High Level Balance Comments: pt with significant difficulty with tandem walking requiring maxA  to maintain balance. Pt with difficult with braiding as well   End of Session OT - End of Session Activity Tolerance: Patient tolerated treatment well Patient left: in chair;with call bell/phone within reach Nurse Communication: Mobility status  GO     Dejia Ebron,HILLARY 11/20/2013, 12:50 PM [facility], OTR/L  [phone] 11/20/2013

## 2013-11-20 NOTE — Progress Notes (Signed)
UR completed.  Yamira Papa, RN BSN [facility] CCM Trauma/Neuro ICU Case Manager [phone]  

## 2013-11-20 NOTE — Evaluation (Signed)
Physical Therapy Evaluation Patient Details Name: [name] [name] MRN: [id number] DOB: [birth year] Today's Date: 11/20/2013 Time: 1030-1057 PT Time Calculation (min): 27 min  PT Assessment / Plan / Recommendation History of Present Illness  Pt s/p cerebellar tumor resection  Clinical Impression  Pt tolerating OOB mobility well and is safe for d/c home with spouse once medical stable. Pt to benefit from outpt PT for higher level balance. Assist pt to bathroom and pt supervision with tolieting. Educated pt on limiting bending over. Acute PT to follow to progress higher level balance.    PT Assessment  Patient needs continued PT services    Follow Up Recommendations  Outpatient PT;Supervision - Intermittent (for high level balance)    Does the patient have the potential to tolerate intense rehabilitation      Barriers to Discharge        Equipment Recommendations  None recommended by PT    Recommendations for Other Services     Frequency Min 2X/week    Precautions / Restrictions Precautions Precautions: Fall   Pertinent Vitals/Pain 3/10 headache      Mobility  Bed Mobility Bed Mobility: Supine to Sit Supine to Sit: 5: Supervision Details for Bed Mobility Assistance: HOB elevated Transfers Transfers: Sit to Stand;Stand to Sit Sit to Stand: 4: Min guard Stand to Sit: 4: Min guard Details for Transfer Assistance: v/c's for safety Ambulation/Gait Ambulation/Gait Assistance: 4: Min guard Ambulation Distance (Feet): 150 Feet Assistive device: None Ambulation/Gait Assistance Details: no episodes of LOB  Gait Pattern: Within Functional Limits Gait velocity: initially cautious/guarded General Gait Details: initiatlly short shuffled steps progressing into step through Stairs: No    Exercises     PT Diagnosis:  (balance impairment)  PT Problem List: Decreased balance PT Treatment Interventions: Gait training;Stair training;Balance training;Neuromuscular re-education      PT Goals(Current goals can be found in the care plan section) Acute Rehab PT Goals Patient Stated Goal: beat this and get out of here PT Goal Formulation: With patient Time For Goal Achievement: 11/27/13 Potential to Achieve Goals: Good Additional Goals Additional Goal #1: Pt to score >19 on DGI to indicate minimal falls risk.  Visit Information  Last PT Received On: 11/20/13 Assistance Needed: +1 History of Present Illness: Pt s/p cerebellar tumor resection       Prior Functioning  Home Living Family/patient expects to be discharged to:: Private residence Living Arrangements: Spouse/significant other Available Help at Discharge: Family;Available 24 hours/day Type of Home: House Home Access: Stairs to enter [employer] of Steps: 1 (stoop) Entrance Stairs-Rails: None Home Layout: One level Home Equipment: Walker - 2 wheels;Walker - 4 wheels;Cane - single point;Shower seat Prior Function Level of Independence: Independent 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 Dominant Hand: Right    Cognition  Cognition Arousal/Alertness: Awake/alert Behavior During Therapy: WFL for tasks assessed/performed Overall Cognitive Status: Within Functional Limits for tasks assessed    Extremity/Trunk Assessment Upper Extremity Assessment Upper Extremity Assessment: Overall WFL for tasks assessed Lower Extremity Assessment Lower Extremity Assessment: Overall WFL for tasks assessed Cervical / Trunk Assessment Cervical / Trunk Assessment: Normal   Balance Balance Balance Assessed: Yes High Level Balance High Level Balance Activites: Side stepping;Braiding;Backward walking (tandem walking) High Level Balance Comments: pt with significant difficulty with tandem walking requiring maxA  to maintain balance. Pt with difficult with braiding as well  End of Session PT - End of Session Equipment Utilized During Treatment: Gait belt Activity  Tolerance: Patient tolerated treatment well Patient left:  in chair;with call bell/phone within reach;with family/visitor present Nurse Communication: Mobility status  GP     Marcene Brawn 11/20/2013, 12:30 PM  Lewis Shock, PT, DPT Pager #: [phone] Office #: [phone]

## 2013-11-21 MED ORDER — PANTOPRAZOLE SODIUM 40 MG PO TBEC
40.0000 mg | DELAYED_RELEASE_TABLET | Freq: Every day | ORAL | Status: DC
  Administered 2013-11-21: 40 mg via ORAL
  Filled 2013-11-21: qty 1

## 2013-11-21 NOTE — Progress Notes (Signed)
Patient ID: [name] [name], female   DOB: 11-25-1961, 52 y.o.   MRN: [id number] Ambulating, wound dry. eating

## 2013-11-22 MED ORDER — ACETAMINOPHEN-CODEINE #3 300-30 MG PO TABS: 1.0000 | ORAL_TABLET | ORAL | Status: DC | PRN

## 2013-11-22 MED ORDER — METHOCARBAMOL 500 MG PO TABS: 500.0000 mg | ORAL_TABLET | Freq: Four times a day (QID) | ORAL | Status: DC | PRN

## 2013-11-22 MED ORDER — NICOTINE 14 MG/24HR TD PT24: 14.0000 mg | MEDICATED_PATCH | Freq: Every day | TRANSDERMAL | Status: DC

## 2013-11-22 MED ORDER — DEXAMETHASONE 2 MG PO TABS: ORAL_TABLET | ORAL | Status: DC

## 2013-11-22 MED ORDER — FAMOTIDINE 20 MG PO TABS: 20.0000 mg | ORAL_TABLET | Freq: Two times a day (BID) | ORAL | Status: DC

## 2013-11-22 NOTE — Progress Notes (Signed)
Pt discharge home for self care. Prescription and d/c instruction with follow up instruction given to patient and family and both verbalized  good understanding. Condition at discharge is stable.

## 2013-11-22 NOTE — Discharge Summary (Signed)
Physician Discharge Summary  Patient ID: [name] [name] MRN: [id number] DOB/AGE: 03-16-1961 52 y.o.  Admit date: 11/14/2013 Discharge date: 11/22/2013  Admission Diagnoses: Right cerebellar mass  Discharge Diagnoses: Right cerebellar mass, metastatic tumor Principal Problem:   Cerebellar mass Active Problems:   Hypothyroid   Tobacco abuse   Lung nodule   S/P craniotomy   Discharged Condition: good  Hospital Course: Patient was admitted to undergo surgical decompression of her right cerebellar mass. She tolerated the surgery well. Her incision has remained clean and dry. She is ambulatory. She has minimal headache. She is discharged home.  Consults: None  Significant Diagnostic Studies: None  Treatments: surgery: Suboccipital craniectomy with resection of right cerebellar mass using Stealth guidance  Discharge Exam: Blood pressure 133/89, pulse 68, temperature 98.4 F (36.9 C), temperature source Oral, resp. rate 18, height 5\' 7"  (1.702 m), weight 53.9 kg (118 lb 13.3 oz), SpO2 100.00%. Station and gait are normal no evidence of past-pointing rapid alternating movements are intact. Motor exam is normal.  Disposition: Discharge home  Discharge Orders   Future Orders Complete By Expires   Call MD for:  redness, tenderness, or signs of infection (pain, swelling, redness, odor or green/yellow discharge around incision site)  As directed    Call MD for:  severe uncontrolled pain  As directed    Call MD for:  temperature >100.4  As directed    Diet - low sodium heart healthy  As directed    Increase activity slowly  As directed    No dressing needed  As directed        Medication List         4-WAY NASAL SPRAY NA  Place 1 spray into the nose as needed (for nasal congestion).     acetaminophen-codeine 300-30 MG per tablet  Commonly known as:  TYLENOL #3  Take 1-2 tablets by mouth every 4 (four) hours as needed for moderate pain.     dexamethasone 2 MG tablet   Commonly known as:  DECADRON  One four times daily  For three days then one twice daily for three days then one daily     famotidine 20 MG tablet  Commonly known as:  PEPCID  Take 1 tablet (20 mg total) by mouth 2 (two) times daily.     levothyroxine 125 MCG tablet  Commonly known as:  SYNTHROID, LEVOTHROID  Take 125 mcg by mouth daily before breakfast.     nicotine 14 mg/24hr patch  Commonly known as:  NICODERM CQ - dosed in mg/24 hours  Place 1 patch (14 mg total) onto the skin daily.     piroxicam 20 MG capsule  Commonly known as:  FELDENE  Take 20 mg by mouth daily.         SignedStefani Dama 11/22/2013, 6:25 PM

## 2013-11-22 NOTE — Progress Notes (Signed)
Physical Therapy Treatment Patient Details Name: [name] [name] MRN: [id number] DOB: 1961/03/09 Today's Date: 11/22/2013 Time: 1478-2956 PT Time Calculation (min): 18 min  PT Assessment / Plan / Recommendation  History of Present Illness Pt s/p cerebellar tumor resection   PT Comments   No overt deviation with high level balance challenge.  No further PT needs at this time.   Follow Up Recommendations  No PT follow up     Does the patient have the potential to tolerate intense rehabilitation     Barriers to Discharge        Equipment Recommendations  None recommended by PT    Recommendations for Other Services    Frequency     Progress towards PT Goals Progress towards PT goals: Progressing toward goals  Plan Current plan remains appropriate    Precautions / Restrictions Precautions Precautions: None   Pertinent Vitals/Pain     Mobility  Bed Mobility Bed Mobility: Supine to Sit Supine to Sit: 6: Modified independent (Device/Increase time) Transfers Transfers: Sit to Stand;Stand to Sit Sit to Stand: 7: Independent Stand to Sit: 7: Independent Ambulation/Gait Ambulation/Gait Assistance: 4: Min guard Ambulation Distance (Feet): 600 Feet Assistive device: None Ambulation/Gait Assistance Details: WFL with no deviation given high level balance challenge Gait Pattern: Within Functional Limits Stairs: Yes Stairs Assistance: 7: Independent Stair Management Technique: No rails;Alternating pattern Number of Stairs: 13    Exercises     PT Diagnosis:    PT Problem List:   PT Treatment Interventions:     PT Goals (current goals can now be found in the care plan section) Acute Rehab PT Goals PT Goal Formulation: With patient Time For Goal Achievement: 11/27/13 Potential to Achieve Goals: Good  Visit Information  Last PT Received On: 11/22/13 Assistance Needed: +1 History of Present Illness: Pt s/p cerebellar tumor resection    Subjective Data  Subjective: I  feel totally fine now.   Cognition  Cognition Arousal/Alertness: Awake/alert Behavior During Therapy: WFL for tasks assessed/performed Overall Cognitive Status: Within Functional Limits for tasks assessed    Balance  Balance Balance Assessed: Yes High Level Balance High Level Balance Activites: Side stepping;Backward walking;Direction changes;Turns;Sudden stops;Head turns;Other (comment) (squats to pick up objects) High Level Balance Comments: no deviation with high level balance  End of Session PT - End of Session Activity Tolerance: Patient tolerated treatment well Patient left: with family/visitor present Nurse Communication: Mobility status   GP     Cruz Bong, Eliseo Gum 11/22/2013, 1:30 PM 11/22/2013  [place] Bing, PT [phone]  (pager)

## 2013-11-23 LAB — TYPE AND SCREEN
Antibody Screen: NEGATIVE
Unit division: 0

## 2013-11-25 ENCOUNTER — Encounter (HOSPITAL_COMMUNITY): Payer: Self-pay | Admitting: Neurological Surgery

## 2013-11-25 NOTE — Progress Notes (Signed)
UR complete.  Johnathin Vanderschaaf RN, MSN 

## 2013-11-29 LAB — BLOOD PRODUCT ORDER (VERBAL) VERIFICATION

## 2013-12-01 ENCOUNTER — Encounter (HOSPITAL_BASED_OUTPATIENT_CLINIC_OR_DEPARTMENT_OTHER): Payer: Self-pay | Admitting: Emergency Medicine

## 2013-12-01 ENCOUNTER — Emergency Department (HOSPITAL_BASED_OUTPATIENT_CLINIC_OR_DEPARTMENT_OTHER): Payer: 59

## 2013-12-01 ENCOUNTER — Emergency Department (HOSPITAL_BASED_OUTPATIENT_CLINIC_OR_DEPARTMENT_OTHER)
Admission: EM | Admit: 2013-12-01 | Discharge: 2013-12-01 | Disposition: A | Discharge status: Home / Self Care | Payer: 59 | Source: Home / Self Care | Attending: Emergency Medicine | Admitting: Emergency Medicine

## 2013-12-01 DIAGNOSIS — Z87828 Personal history of other (healed) physical injury and trauma: Secondary | ICD-10-CM | POA: Insufficient documentation

## 2013-12-01 DIAGNOSIS — K644 Residual hemorrhoidal skin tags: Secondary | ICD-10-CM | POA: Insufficient documentation

## 2013-12-01 DIAGNOSIS — IMO0002 Reserved for concepts with insufficient information to code with codable children: Secondary | ICD-10-CM

## 2013-12-01 DIAGNOSIS — L089 Local infection of the skin and subcutaneous tissue, unspecified: Secondary | ICD-10-CM | POA: Diagnosis not present

## 2013-12-01 DIAGNOSIS — E079 Disorder of thyroid, unspecified: Secondary | ICD-10-CM | POA: Insufficient documentation

## 2013-12-01 DIAGNOSIS — Z79899 Other long term (current) drug therapy: Secondary | ICD-10-CM | POA: Insufficient documentation

## 2013-12-01 DIAGNOSIS — F17200 Nicotine dependence, unspecified, uncomplicated: Secondary | ICD-10-CM | POA: Insufficient documentation

## 2013-12-01 DIAGNOSIS — T8140XA Infection following a procedure, unspecified, initial encounter: Secondary | ICD-10-CM | POA: Diagnosis not present

## 2013-12-01 DIAGNOSIS — K5900 Constipation, unspecified: Secondary | ICD-10-CM | POA: Insufficient documentation

## 2013-12-01 DIAGNOSIS — Z9089 Acquired absence of other organs: Secondary | ICD-10-CM | POA: Insufficient documentation

## 2013-12-01 LAB — BASIC METABOLIC PANEL
BUN: 15 mg/dL (ref 6–23)
CHLORIDE: 94 meq/L — AB (ref 96–112)
CO2: 24 meq/L (ref 19–32)
Calcium: 8.8 mg/dL (ref 8.4–10.5)
Creatinine, Ser: 0.4 mg/dL — ABNORMAL LOW (ref 0.50–1.10)
GFR calc Af Amer: >90 mL/min (ref >90)
GFR calc non Af Amer: >90 mL/min (ref >90)
Glucose, Bld: 145 mg/dL — ABNORMAL HIGH (ref 70–99)
POTASSIUM: 3.7 meq/L (ref 3.7–5.3)
Sodium: 132 mEq/L — ABNORMAL LOW (ref 137–147)

## 2013-12-01 MED ORDER — FLEET ENEMA 7-19 GM/118ML RE ENEM
1.0000 | ENEMA | Freq: Once | RECTAL | Status: AC
  Administered 2013-12-01: 1 via RECTAL
  Filled 2013-12-01: qty 1

## 2013-12-01 NOTE — Discharge Instructions (Signed)

## 2013-12-01 NOTE — ED Provider Notes (Signed)
CSN: [id number]     Arrival date & time 12/01/13  1020 History   First MD Initiated Contact with Patient 12/01/13 1035     Chief Complaint  Patient presents with  . Abdominal Pain   (Consider location/radiation/quality/duration/timing/severity/associated sxs/prior Treatment) Patient is a 53 y.o. female presenting with constipation. The history is provided by the patient.  Constipation Severity:  Moderate Time since last bowel movement:  3 days Timing:  Constant Progression:  Worsening Chronicity:  New Context: medication and narcotics   Context: not dehydration and not dietary changes   Stool description:  Hard Relieved by:  Nothing Ineffective treatments:  Defecation, enemas and laxatives Associated symptoms: no fever     Past Medical History  Diagnosis Date  . TMJ (dislocation of temporomandibular joint)   . Thyroid disease    Past Surgical History  Procedure Laterality Date  . Tonsillectomy    . Appendectomy    . Suboccipital craniectomy cervical laminectomy N/A 11/19/2013    Procedure: SUBOCCIPITAL CRANIECTOMY for tumor;  Surgeon: Eustace Moore, MD;  Location: [facility];  Service: Neurosurgery;  Laterality: N/A;   Family History  Problem Relation Age of Onset  . Dementia Mother    History  Substance Use Topics  . Smoking status: Current Every Day Smoker  . Smokeless tobacco: Not on file  . Alcohol Use: No   OB History   Grav Para Term Preterm Abortions TAB SAB Ect Mult Living                 Review of Systems  Constitutional: Negative for fever and chills.  Respiratory: Negative for cough and shortness of breath.   Gastrointestinal: Positive for constipation.  All other systems reviewed and are negative.    Allergies  Morphine and related  Home Medications   Current Outpatient Rx  Name  Route  Sig  Dispense  Refill  . acetaminophen-codeine (TYLENOL #3) 300-30 MG per tablet   Oral   Take 1-2 tablets by mouth every 4 (four) hours as needed for  moderate pain.   60 tablet   0   . dexamethasone (DECADRON) 2 MG tablet      daily. One four times daily  For three days then one twice daily for three days then one daily         . famotidine (PEPCID) 20 MG tablet   Oral   Take 1 tablet (20 mg total) by mouth 2 (two) times daily.   60 tablet   5   . levothyroxine (SYNTHROID, LEVOTHROID) 125 MCG tablet   Oral   Take 125 mcg by mouth daily before breakfast.         . methocarbamol (ROBAXIN) 500 MG tablet   Oral   Take 1 tablet (500 mg total) by mouth every 6 (six) hours as needed for muscle spasms.   30 tablet   1   . nicotine (NICODERM CQ - DOSED IN MG/24 HOURS) 14 mg/24hr patch   Transdermal   Place 1 patch (14 mg total) onto the skin daily.   28 patch   0   . piroxicam (FELDENE) 20 MG capsule   Oral   Take 20 mg by mouth daily.         . Xylometazoline HCl (4-WAY NASAL SPRAY NA)   Nasal   Place 1 spray into the nose as needed (for nasal congestion).          BP 135/75  Pulse 58  Temp(Src) 97.5 F (36.4  C) (Oral)  Resp 20  Ht 5\' 7"  (1.702 m)  Wt 120 lb (54.432 kg)  BMI 18.79 kg/m2  SpO2 100% Physical Exam  Nursing note and vitals reviewed. Constitutional: She is oriented to person, place, and time. She appears well-developed and well-nourished. No distress.  HENT:  Head: Normocephalic and atraumatic.  Eyes: EOM are normal. Pupils are equal, round, and reactive to light.  Neck: Normal range of motion. Neck supple.  Cardiovascular: Normal rate and regular rhythm.  Exam reveals no friction rub.   No murmur heard. Pulmonary/Chest: Effort normal and breath sounds normal. No respiratory distress. She has no wheezes. She has no rales.  Abdominal: Soft. She exhibits no distension. There is no tenderness. There is no rebound.  Genitourinary: Rectal exam shows external hemorrhoid (soft).  Soft stool, no impaction  Musculoskeletal: Normal range of motion. She exhibits no edema.  Neurological: She is alert  and oriented to person, place, and time.  Skin: She is not diaphoretic.    ED Course  Procedures (including critical care time) Labs Review Labs Reviewed - No data to display Imaging Review No results found.  EKG Interpretation   None       MDM   1. Constipation    53 year old female presents with constipation. She has not had a BM for the past 2 days. She had small BM here, but still feels extremely constipated. She recently had neurosurgery for and unidentified cystic mass and was discharged home recently. She has been taking hydrocodone, but has been taking Colace with it. Generally, with an enema. Here she has no abdominal distention and no Donald pain, but appears miserable. On rectal exam, she has a small prolapsed hemorrhoid that is not thrombosed. She has stool in the vault which is soft and non-unable to disimpact her. We'll try another enema here. BMP sent to look for electrolyte abnormalities, AAS to look for possible obstruction. AAS with large stool burden, no obstruction. BMP normal. Patient had 2 voluminous bowel movements here. Stable for discharge.     Osvaldo Shipper, MD 12/01/13 [phone]

## 2013-12-01 NOTE — ED Notes (Signed)
Patient states she has not had a BM for several days and is experiencing abd pain, has been taking colace & used an enema this morning but did not have a BM

## 2013-12-03 ENCOUNTER — Inpatient Hospital Stay (HOSPITAL_COMMUNITY)
Admission: AD | Admit: 2013-12-03 | Discharge: 2013-12-09 | DRG: 863 | Disposition: A | Discharge status: Home Health | Payer: 59 | Source: Ambulatory Visit | Attending: Neurological Surgery | Admitting: Neurological Surgery

## 2013-12-03 ENCOUNTER — Encounter (HOSPITAL_COMMUNITY): Payer: Self-pay | Admitting: *Deleted

## 2013-12-03 DIAGNOSIS — Y838 Other surgical procedures as the cause of abnormal reaction of the patient, or of later complication, without mention of misadventure at the time of the procedure: Secondary | ICD-10-CM | POA: Diagnosis present

## 2013-12-03 DIAGNOSIS — IMO0001 Reserved for inherently not codable concepts without codable children: Secondary | ICD-10-CM

## 2013-12-03 DIAGNOSIS — T8140XA Infection following a procedure, unspecified, initial encounter: Principal | ICD-10-CM | POA: Diagnosis present

## 2013-12-03 DIAGNOSIS — C7931 Secondary malignant neoplasm of brain: Secondary | ICD-10-CM | POA: Diagnosis present

## 2013-12-03 DIAGNOSIS — A4901 Methicillin susceptible Staphylococcus aureus infection, unspecified site: Secondary | ICD-10-CM | POA: Diagnosis present

## 2013-12-03 DIAGNOSIS — T8149XA Infection following a procedure, other surgical site, initial encounter: Secondary | ICD-10-CM

## 2013-12-03 DIAGNOSIS — L089 Local infection of the skin and subcutaneous tissue, unspecified: Secondary | ICD-10-CM | POA: Diagnosis present

## 2013-12-03 DIAGNOSIS — E860 Dehydration: Secondary | ICD-10-CM | POA: Diagnosis present

## 2013-12-03 DIAGNOSIS — Y92009 Unspecified place in unspecified non-institutional (private) residence as the place of occurrence of the external cause: Secondary | ICD-10-CM

## 2013-12-03 DIAGNOSIS — C7949 Secondary malignant neoplasm of other parts of nervous system: Secondary | ICD-10-CM

## 2013-12-03 DIAGNOSIS — Z87898 Personal history of other specified conditions: Secondary | ICD-10-CM

## 2013-12-03 DIAGNOSIS — F17200 Nicotine dependence, unspecified, uncomplicated: Secondary | ICD-10-CM | POA: Diagnosis present

## 2013-12-03 DIAGNOSIS — T148XXA Other injury of unspecified body region, initial encounter: Secondary | ICD-10-CM | POA: Diagnosis present

## 2013-12-03 DIAGNOSIS — T814XXD Infection following a procedure, subsequent encounter: Secondary | ICD-10-CM

## 2013-12-03 LAB — BASIC METABOLIC PANEL
BUN: 23 mg/dL (ref 6–23)
CO2: 25 mEq/L (ref 19–32)
CREATININE: 0.53 mg/dL (ref 0.50–1.10)
Calcium: 9.7 mg/dL (ref 8.4–10.5)
Chloride: 89 mEq/L — ABNORMAL LOW (ref 96–112)
Glucose, Bld: 135 mg/dL — ABNORMAL HIGH (ref 70–99)
POTASSIUM: 3.6 meq/L — AB (ref 3.7–5.3)
Sodium: 131 mEq/L — ABNORMAL LOW (ref 137–147)

## 2013-12-03 LAB — CBC
HCT: 45.8 % (ref 36.0–46.0)
Hemoglobin: 16.4 g/dL — ABNORMAL HIGH (ref 12.0–15.0)
MCH: 32.7 pg (ref 26.0–34.0)
MCHC: 35.8 g/dL (ref 30.0–36.0)
MCV: 91.4 fL (ref 78.0–100.0)
Platelets: 285 10*3/uL (ref 150–400)
RBC: 5.01 MIL/uL (ref 3.87–5.11)
RDW: 14 % (ref 11.5–15.5)
WBC: 17.5 10*3/uL — ABNORMAL HIGH (ref 4.0–10.5)

## 2013-12-03 MED ORDER — PROMETHAZINE HCL 25 MG PO TABS
12.5000 mg | ORAL_TABLET | ORAL | Status: DC | PRN
  Administered 2013-12-03 – 2013-12-05 (×2): 25 mg via ORAL
  Filled 2013-12-03 (×2): qty 1

## 2013-12-03 MED ORDER — DEXAMETHASONE SODIUM PHOSPHATE 10 MG/ML IJ SOLN
6.0000 mg | Freq: Four times a day (QID) | INTRAMUSCULAR | Status: AC
  Administered 2013-12-03 – 2013-12-04 (×4): 6 mg via INTRAVENOUS
  Filled 2013-12-03 (×4): qty 0.6

## 2013-12-03 MED ORDER — POTASSIUM CHLORIDE IN NACL 20-0.9 MEQ/L-% IV SOLN
INTRAVENOUS | Status: DC
  Administered 2013-12-03 – 2013-12-05 (×4): via INTRAVENOUS
  Filled 2013-12-03 (×14): qty 1000

## 2013-12-03 MED ORDER — ONDANSETRON HCL 4 MG/2ML IJ SOLN: 4.0000 mg | INTRAMUSCULAR | Status: DC | PRN

## 2013-12-03 MED ORDER — HYDROMORPHONE HCL PF 1 MG/ML IJ SOLN
0.5000 mg | INTRAMUSCULAR | Status: DC | PRN
  Administered 2013-12-03 – 2013-12-05 (×8): 1 mg via INTRAVENOUS
  Administered 2013-12-05: 0.5 mg via INTRAVENOUS
  Administered 2013-12-05 – 2013-12-07 (×10): 1 mg via INTRAVENOUS
  Administered 2013-12-07: 0.5 mg via INTRAVENOUS
  Administered 2013-12-07 – 2013-12-08 (×5): 1 mg via INTRAVENOUS
  Administered 2013-12-09 (×2): 0.5 mg via INTRAVENOUS
  Filled 2013-12-03 (×28): qty 1

## 2013-12-03 MED ORDER — ONDANSETRON HCL 4 MG PO TABS
4.0000 mg | ORAL_TABLET | ORAL | Status: DC | PRN
Start: 2013-12-03 — End: 2013-12-10

## 2013-12-03 MED ORDER — DEXAMETHASONE SODIUM PHOSPHATE 4 MG/ML IJ SOLN
4.0000 mg | Freq: Four times a day (QID) | INTRAMUSCULAR | Status: AC
  Administered 2013-12-04 – 2013-12-05 (×4): 4 mg via INTRAVENOUS
  Filled 2013-12-03 (×6): qty 1

## 2013-12-03 MED ORDER — SENNA 8.6 MG PO TABS
1.0000 | ORAL_TABLET | Freq: Two times a day (BID) | ORAL | Status: DC
  Administered 2013-12-03 – 2013-12-09 (×12): 8.6 mg via ORAL
  Filled 2013-12-03 (×16): qty 1

## 2013-12-03 MED ORDER — ACETAMINOPHEN 650 MG RE SUPP: 650.0000 mg | RECTAL | Status: DC | PRN

## 2013-12-03 MED ORDER — ACETAMINOPHEN 325 MG PO TABS: 650.0000 mg | ORAL_TABLET | ORAL | Status: DC | PRN

## 2013-12-03 MED ORDER — DEXAMETHASONE SODIUM PHOSPHATE 4 MG/ML IJ SOLN
4.0000 mg | Freq: Three times a day (TID) | INTRAMUSCULAR | Status: DC
  Administered 2013-12-05 – 2013-12-09 (×12): 4 mg via INTRAVENOUS
  Filled 2013-12-03 (×19): qty 1

## 2013-12-03 MED ORDER — INFLUENZA VAC SPLIT QUAD 0.5 ML IM SUSP
0.5000 mL | INTRAMUSCULAR | Status: AC
  Administered 2013-12-06: 0.5 mL via INTRAMUSCULAR
  Filled 2013-12-03: qty 0.5

## 2013-12-03 MED ORDER — LEVOTHYROXINE SODIUM 100 MCG PO TABS
100.0000 ug | ORAL_TABLET | Freq: Every day | ORAL | Status: DC
  Administered 2013-12-04 – 2013-12-09 (×6): 100 ug via ORAL
  Filled 2013-12-03 (×8): qty 1

## 2013-12-03 MED ORDER — PANTOPRAZOLE SODIUM 40 MG IV SOLR
40.0000 mg | Freq: Every day | INTRAVENOUS | Status: DC
  Administered 2013-12-03 – 2013-12-05 (×3): 40 mg via INTRAVENOUS
  Filled 2013-12-03 (×4): qty 40

## 2013-12-03 MED ORDER — PIROXICAM 20 MG PO CAPS
20.0000 mg | ORAL_CAPSULE | Freq: Every day | ORAL | Status: DC
  Administered 2013-12-03 – 2013-12-09 (×7): 20 mg via ORAL
  Filled 2013-12-03 (×10): qty 1

## 2013-12-03 NOTE — H&P (Signed)
Reason for Consult:brain mass, s/p crani Referring Physician: n/a  [name] [name] is an 53 y.o. female.   HPI: 53 yo wf s/p suboccipital crani for cerebellar tumor presents with 48 hr h/o N/V and headaches. No fever, but some chills. No visual changes. On decadron 2 mg. Path still pending. Admitted for dehydration and CT head to rule out swelling and/or hydrocephalus. Was seen in office yesterday with milder symptoms, presented today feeling worse. Tried to w/u as outpatient but insurance wouldn't approve head CT despite this history, and I feared she was becoming dehydrated and needed IV steroids.  Past Medical History  Diagnosis Date  . TMJ (dislocation of temporomandibular joint)   . Thyroid disease     Past Surgical History  Procedure Laterality Date  . Tonsillectomy    . Appendectomy    . Suboccipital craniectomy cervical laminectomy N/A 11/19/2013    Procedure: SUBOCCIPITAL CRANIECTOMY for tumor;  Surgeon: Eustace Moore, MD;  Location: [facility];  Service: Neurosurgery;  Laterality: N/A;    Allergies  Allergen Reactions  . Morphine And Related Nausea Only    History  Substance Use Topics  . Smoking status: Current Every Day Smoker  . Smokeless tobacco: Not on file  . Alcohol Use: No    Family History  Problem Relation Age of Onset  . Dementia Mother      Review of Systems  Positive ROS: neg  All other systems have been reviewed and were otherwise negative with the exception of those mentioned in the HPI and as above.  Objective: Vital signs in last 24 hours: Temp:  [98.6 F (37 C)] 98.6 F (37 C) (01/06 1828) Pulse Rate:  [69] 69 (01/06 1828) Resp:  [18] 18 (01/06 1828) BP: (146)/(89) 146/89 mmHg (01/06 1828) SpO2:  [100 %] 100 % (01/06 1828) Weight:  [53.162 kg (117 lb 3.2 oz)] 53.162 kg (117 lb 3.2 oz) (01/06 1828)  General Appearance: Alert, cooperative, no distress, appears stated age Head: Normocephalic, without obvious abnormality, atraumatic,  healing wound R subocc region with mild redness, no drainage or induration Eyes: PERRL, conjunctiva/corneas clear, EOM's intact      Ears: Normal TM's and external ear canals, both ears Throat: benign Neck: Supple, symmetrical, trachea midline, no adenopathy; thyroid: No enlargement/tenderness/nodules; no carotid bruit or JVD Back: Symmetric, no curvature, ROM normal, no CVA tenderness Lungs: Clear to auscultation bilaterally, respirations unlabored Heart: Regular rate and rhythm, S1 and S2 normal, no murmur, rub or gallop Abdomen: Soft, non-tender, bowel sounds active all four quadrants, no masses, no organomegaly Extremities: Extremities normal, atraumatic, no cyanosis or edema Pulses: 2+ and symmetric all extremities Skin: Skin color, texture, turgor normal, no rashes or lesions  NEUROLOGIC:   Mental status: A&O x4, no aphasia, good attention span, Memory and fund of knowledge Motor Exam - grossly normal, normal tone and bulk Sensory Exam - grossly normal Reflexes: symmetric, no pathologic reflexes, No Hoffman's, No clonus Coordination - grossly normal Gait - grossly normal Balance - grossly normal Cranial Nerves: I: smell Not tested  II: visual acuity  OS: na    OD: na  II: visual fields Full to confrontation  II: pupils Equal, round, reactive to light  III,VII: ptosis None  III,IV,VI: extraocular muscles  Full ROM  V: mastication Normal  V: facial light touch sensation  Normal  V,VII: corneal reflex  Present  VII: facial muscle function - upper  Normal  VII: facial muscle function - lower Normal  VIII: hearing Not tested  IX: soft palate elevation  Normal  IX,X: gag reflex Present  XI: trapezius strength  5/5  XI: sternocleidomastoid strength 5/5  XI: neck flexion strength  5/5  XII: tongue strength  Normal    Data Review Lab Results  Component Value Date   WBC 7.3 11/15/2013   HGB 14.9 11/15/2013   HCT 43.0 11/15/2013   MCV 91.7 11/15/2013   PLT 298 11/15/2013    Lab Results  Component Value Date   NA 132* 12/01/2013   K 3.7 12/01/2013   CL 94* 12/01/2013   CO2 24 12/01/2013   BUN 15 12/01/2013   CREATININE 0.40* 12/01/2013   GLUCOSE 145* 12/01/2013   Lab Results  Component Value Date   INR 1.07 11/15/2013    Radiology: No results found.   Assessment/Plan: Admit for CT head, re-hydration, and IV steroids and r/o infection. Doubt influenza.   JONES,DAVID S 12/03/2013 7:51 PM

## 2013-12-04 ENCOUNTER — Inpatient Hospital Stay (HOSPITAL_COMMUNITY): Payer: 59

## 2013-12-04 ENCOUNTER — Encounter (HOSPITAL_COMMUNITY): Payer: Self-pay | Admitting: *Deleted

## 2013-12-04 NOTE — Progress Notes (Signed)
+    CARE MANAGEMENT NOTE 12/04/2013  Patient:  [name] [name], [name] [name]   Account Number:  [id number]  Date Initiated:  12/04/2013  Documentation initiated by:  Olga Coaster  Subjective/Objective Assessment:   ADMITTED WITH INTRACTABLE PAIN, NAUSEA     Action/Plan:   CM FOLLOWING FOR DCP   Anticipated DC Date:  12/09/2013   Anticipated DC Plan:  POSSIBLY HOME/SELF CARE     DC Planning Services  CM consult         Status of service:  In process, will continue to follow Medicare Important Message given?  NA - LOS <3 / Initial given by admissions (If response is "NO", the following Medicare IM given date fields will be blank)  Per UR Regulation:  Reviewed for med. necessity/level of care/duration of stay  Comments:  1/7/2015Mindi Slicker RN,BSN,MHA [phone]

## 2013-12-04 NOTE — Progress Notes (Signed)
Patient ID: [name] [name], female   DOB: 07/27/1961, 53 y.o.   MRN: [id number] Subjective: Patient reports she is so much better. Drinking, no emesis, no headache.  Objective: Vital signs in last 24 hours: Temp:  [97.9 F (36.6 C)-98.6 F (37 C)] 97.9 F (36.6 C) (01/07 1032) Pulse Rate:  [63-72] 63 (01/07 1032) Resp:  [16-18] 16 (01/07 1032) BP: (104-146)/(63-89) 121/77 mmHg (01/07 1032) SpO2:  [99 %-100 %] 100 % (01/07 1032) Weight:  [53.162 kg (117 lb 3.2 oz)] 53.162 kg (117 lb 3.2 oz) (01/06 1828)  Intake/Output from previous day:   Intake/Output this shift: Total I/O In: 1043.8 [P.O.:120; I.V.:923.8] Out: -   Neurologic: Grossly normal  Lab Results: Lab Results  Component Value Date   WBC 17.5* 12/03/2013   HGB 16.4* 12/03/2013   HCT 45.8 12/03/2013   MCV 91.4 12/03/2013   PLT 285 12/03/2013   Lab Results  Component Value Date   INR 1.07 11/15/2013   BMET Lab Results  Component Value Date   NA 131* 12/03/2013   K 3.6* 12/03/2013   CL 89* 12/03/2013   CO2 25 12/03/2013   GLUCOSE 135* 12/03/2013   BUN 23 12/03/2013   CREATININE 0.53 12/03/2013   CALCIUM 9.7 12/03/2013    Studies/Results: Ct Head Wo Contrast  12/04/2013   CLINICAL DATA:  Recent craniotomy for cerebellar tumor resection presenting with nausea, vomiting and headaches.  EXAM: CT HEAD WITHOUT CONTRAST  TECHNIQUE: Contiguous axial images were obtained from the base of the skull through the vertex without intravenous contrast.  COMPARISON:  11/19/2013  FINDINGS: Sequelae of interval right suboccipital craniotomy for right cerebellar mass resection are identified. Resection cavity is present in the right cerebellar hemisphere. Cerebellar edema has nearly completely resolved, and mass effect on the 4th ventricle has resolved. A small amount of fluid/postoperative change is noted within the soft tissues overlying the craniotomy site. There is no evidence of acute large territory infarct, midline shift, intracranial hemorrhage, or  extra-axial fluid collection. Ventricles and sulci are normal. Orbits are unremarkable. Mastoid air cells and visualized paranasal sinuses are clear.  IMPRESSION: Sequelae of interval right cerebellar mass resection with near complete resolution of cerebellar edema and mass effect on the 4th ventricle. No evidence of acute intracranial abnormality.   Electronically Signed   By: Logan Bores   On: 12/04/2013 07:44    Assessment/Plan: Doing better and CT looks good. Was this viral? Maybe home tomorrow.   LOS: 1 day    Crickett Abbett S 12/04/2013, 11:17 AM

## 2013-12-05 ENCOUNTER — Encounter (HOSPITAL_COMMUNITY): Payer: Self-pay | Admitting: Anesthesiology

## 2013-12-05 ENCOUNTER — Encounter (HOSPITAL_COMMUNITY)
Admission: AD | Disposition: A | Discharge status: Home Health | Payer: Self-pay | Source: Ambulatory Visit | Attending: Neurological Surgery

## 2013-12-05 ENCOUNTER — Inpatient Hospital Stay (HOSPITAL_COMMUNITY): Payer: 59 | Admitting: Anesthesiology

## 2013-12-05 ENCOUNTER — Encounter (HOSPITAL_COMMUNITY): Payer: 59 | Admitting: Anesthesiology

## 2013-12-05 DIAGNOSIS — T8149XA Infection following a procedure, other surgical site, initial encounter: Secondary | ICD-10-CM

## 2013-12-05 HISTORY — PX: LUMBAR WOUND DEBRIDEMENT: SHX1988

## 2013-12-05 LAB — CBC
HCT: 41.2 % (ref 36.0–46.0)
Hemoglobin: 14.2 g/dL (ref 12.0–15.0)
MCH: 32.1 pg (ref 26.0–34.0)
MCHC: 34.5 g/dL (ref 30.0–36.0)
MCV: 93.2 fL (ref 78.0–100.0)
PLATELETS: 262 10*3/uL (ref 150–400)
RBC: 4.42 MIL/uL (ref 3.87–5.11)
RDW: 13.7 % (ref 11.5–15.5)
WBC: 17 10*3/uL — AB (ref 4.0–10.5)

## 2013-12-05 SURGERY — LUMBAR WOUND DEBRIDEMENT
Anesthesia: General

## 2013-12-05 MED ORDER — HYDROMORPHONE HCL PF 1 MG/ML IJ SOLN
INTRAMUSCULAR | Status: AC
  Filled 2013-12-05: qty 1

## 2013-12-05 MED ORDER — PHENYLEPHRINE HCL 10 MG/ML IJ SOLN
INTRAMUSCULAR | Status: DC | PRN
  Administered 2013-12-05: 80 ug via INTRAVENOUS

## 2013-12-05 MED ORDER — NICOTINE 14 MG/24HR TD PT24
14.0000 mg | MEDICATED_PATCH | Freq: Every day | TRANSDERMAL | Status: DC
  Administered 2013-12-05 – 2013-12-09 (×5): 14 mg via TRANSDERMAL
  Filled 2013-12-05 (×7): qty 1

## 2013-12-05 MED ORDER — SODIUM CHLORIDE 0.9 % IR SOLN
Status: DC | PRN
  Administered 2013-12-05: 14:00:00

## 2013-12-05 MED ORDER — VANCOMYCIN HCL IN DEXTROSE 1-5 GM/200ML-% IV SOLN
INTRAVENOUS | Status: AC
  Administered 2013-12-05: 1000 mg via INTRAVENOUS
  Filled 2013-12-05: qty 200

## 2013-12-05 MED ORDER — HYDROMORPHONE HCL PF 1 MG/ML IJ SOLN
0.2500 mg | INTRAMUSCULAR | Status: DC | PRN
  Administered 2013-12-05 (×2): 0.5 mg via INTRAVENOUS

## 2013-12-05 MED ORDER — PROPOFOL 10 MG/ML IV BOLUS
INTRAVENOUS | Status: DC | PRN
  Administered 2013-12-05: 150 mg via INTRAVENOUS

## 2013-12-05 MED ORDER — 0.9 % SODIUM CHLORIDE (POUR BTL) OPTIME
TOPICAL | Status: DC | PRN
  Administered 2013-12-05: 1000 mL

## 2013-12-05 MED ORDER — OXYCODONE HCL 5 MG PO TABS
ORAL_TABLET | ORAL | Status: AC
  Filled 2013-12-05: qty 1

## 2013-12-05 MED ORDER — OXYCODONE HCL 5 MG PO TABS
5.0000 mg | ORAL_TABLET | Freq: Once | ORAL | Status: AC | PRN
  Administered 2013-12-05: 5 mg via ORAL

## 2013-12-05 MED ORDER — SODIUM CHLORIDE 0.9 % IJ SOLN: 3.0000 mL | INTRAMUSCULAR | Status: DC | PRN

## 2013-12-05 MED ORDER — SODIUM CHLORIDE 0.9 % IV SOLN
INTRAVENOUS | Status: DC | PRN
  Administered 2013-12-05 (×3): via INTRAVENOUS

## 2013-12-05 MED ORDER — HEMOSTATIC AGENTS (NO CHARGE) OPTIME
TOPICAL | Status: DC | PRN
  Administered 2013-12-05: 1 via TOPICAL

## 2013-12-05 MED ORDER — SODIUM CHLORIDE 0.9 % IV SOLN: 250.0000 mL | INTRAVENOUS | Status: DC

## 2013-12-05 MED ORDER — FENTANYL CITRATE 0.05 MG/ML IJ SOLN
INTRAMUSCULAR | Status: DC | PRN
  Administered 2013-12-05: 100 ug via INTRAVENOUS

## 2013-12-05 MED ORDER — ARTIFICIAL TEARS OP OINT
TOPICAL_OINTMENT | OPHTHALMIC | Status: DC | PRN
  Administered 2013-12-05: 1 via OPHTHALMIC

## 2013-12-05 MED ORDER — VANCOMYCIN HCL IN DEXTROSE 750-5 MG/150ML-% IV SOLN
750.0000 mg | Freq: Two times a day (BID) | INTRAVENOUS | Status: DC
  Administered 2013-12-06 – 2013-12-08 (×5): 750 mg via INTRAVENOUS
  Filled 2013-12-05 (×7): qty 150

## 2013-12-05 MED ORDER — DEXTROSE 5 % IV SOLN
1.0000 g | INTRAVENOUS | Status: DC
  Administered 2013-12-06: 1 g via INTRAVENOUS
  Filled 2013-12-05: qty 10

## 2013-12-05 MED ORDER — DEXTROSE 5 % IV SOLN
1.0000 g | INTRAVENOUS | Status: DC | PRN
  Administered 2013-12-05: 1 g via INTRAVENOUS

## 2013-12-05 MED ORDER — METHOCARBAMOL 500 MG PO TABS
250.0000 mg | ORAL_TABLET | Freq: Four times a day (QID) | ORAL | Status: DC | PRN
  Administered 2013-12-05 – 2013-12-07 (×4): 500 mg via ORAL
  Administered 2013-12-09 (×2): 250 mg via ORAL
  Filled 2013-12-05 (×7): qty 1

## 2013-12-05 MED ORDER — LACTATED RINGERS IV SOLN: INTRAVENOUS | Status: DC | PRN

## 2013-12-05 MED ORDER — MENTHOL 3 MG MT LOZG: 1.0000 | LOZENGE | OROMUCOSAL | Status: DC | PRN

## 2013-12-05 MED ORDER — NEOSTIGMINE METHYLSULFATE 1 MG/ML IJ SOLN
INTRAMUSCULAR | Status: DC | PRN
  Administered 2013-12-05: 3 mg via INTRAVENOUS

## 2013-12-05 MED ORDER — DEXTROSE 5 % IV SOLN
1.0000 g | INTRAVENOUS | Status: DC
  Filled 2013-12-05: qty 10

## 2013-12-05 MED ORDER — METOCLOPRAMIDE HCL 5 MG/ML IJ SOLN
10.0000 mg | Freq: Once | INTRAMUSCULAR | Status: AC | PRN
  Filled 2013-12-05: qty 2

## 2013-12-05 MED ORDER — MIDAZOLAM HCL 5 MG/5ML IJ SOLN
INTRAMUSCULAR | Status: DC | PRN
  Administered 2013-12-05: 2 mg via INTRAVENOUS

## 2013-12-05 MED ORDER — ONDANSETRON HCL 4 MG/2ML IJ SOLN
INTRAMUSCULAR | Status: DC | PRN
  Administered 2013-12-05: 4 mg via INTRAVENOUS

## 2013-12-05 MED ORDER — POTASSIUM CHLORIDE IN NACL 20-0.9 MEQ/L-% IV SOLN: INTRAVENOUS | Status: DC

## 2013-12-05 MED ORDER — PHENOL 1.4 % MT LIQD: 1.0000 | OROMUCOSAL | Status: DC | PRN

## 2013-12-05 MED ORDER — BUPIVACAINE HCL (PF) 0.25 % IJ SOLN
INTRAMUSCULAR | Status: DC | PRN
  Administered 2013-12-05: 10 mL

## 2013-12-05 MED ORDER — SODIUM CHLORIDE 0.9 % IJ SOLN: 3.0000 mL | Freq: Two times a day (BID) | INTRAMUSCULAR | Status: DC

## 2013-12-05 MED ORDER — LIDOCAINE HCL (CARDIAC) 20 MG/ML IV SOLN
INTRAVENOUS | Status: DC | PRN
  Administered 2013-12-05: 40 mg via INTRAVENOUS

## 2013-12-05 MED ORDER — ROCURONIUM BROMIDE 100 MG/10ML IV SOLN
INTRAVENOUS | Status: DC | PRN
  Administered 2013-12-05: 40 mg via INTRAVENOUS

## 2013-12-05 MED ORDER — THROMBIN 5000 UNITS EX SOLR
CUTANEOUS | Status: DC | PRN
  Administered 2013-12-05 (×2): 5000 [IU] via TOPICAL

## 2013-12-05 MED ORDER — ONDANSETRON HCL 4 MG/2ML IJ SOLN: 4.0000 mg | INTRAMUSCULAR | Status: DC | PRN

## 2013-12-05 MED ORDER — GLYCOPYRROLATE 0.2 MG/ML IJ SOLN
INTRAMUSCULAR | Status: DC | PRN
  Administered 2013-12-05: .4 mg via INTRAVENOUS

## 2013-12-05 MED ORDER — OXYCODONE HCL 5 MG/5ML PO SOLN: 5.0000 mg | Freq: Once | ORAL | Status: AC | PRN

## 2013-12-05 SURGICAL SUPPLY — 40 items
BAG DECANTER FOR FLEXI CONT (MISCELLANEOUS) ×4 IMPLANT
BENZOIN TINCTURE PRP APPL 2/3 (GAUZE/BANDAGES/DRESSINGS) ×2 IMPLANT
CANISTER SUCT 3000ML (MISCELLANEOUS) ×2 IMPLANT
DRAPE LAPAROTOMY 100X72X124 (DRAPES) ×2 IMPLANT
DRAPE POUCH INSTRU U-SHP 10X18 (DRAPES) ×2 IMPLANT
DRESSING TELFA 8X3 (GAUZE/BANDAGES/DRESSINGS) ×2 IMPLANT
DRSG OPSITE 4X5.5 SM (GAUZE/BANDAGES/DRESSINGS) ×2 IMPLANT
DRSG OPSITE POSTOP 4X6 (GAUZE/BANDAGES/DRESSINGS) ×2 IMPLANT
DURAPREP 26ML APPLICATOR (WOUND CARE) IMPLANT
DURAPREP 6ML APPLICATOR 50/CS (WOUND CARE) IMPLANT
ELECT REM PT RETURN 9FT ADLT (ELECTROSURGICAL) ×2
ELECTRODE REM PT RTRN 9FT ADLT (ELECTROSURGICAL) ×1 IMPLANT
EVACUATOR 1/8 PVC DRAIN (DRAIN) ×2 IMPLANT
GAUZE SPONGE 4X4 16PLY XRAY LF (GAUZE/BANDAGES/DRESSINGS) IMPLANT
GLOVE BIO SURGEON STRL SZ8 (GLOVE) ×2 IMPLANT
GOWN BRE IMP SLV AUR LG STRL (GOWN DISPOSABLE) IMPLANT
GOWN BRE IMP SLV AUR XL STRL (GOWN DISPOSABLE) IMPLANT
GOWN STRL REIN 2XL LVL4 (GOWN DISPOSABLE) ×2 IMPLANT
KIT BASIN OR (CUSTOM PROCEDURE TRAY) ×2 IMPLANT
KIT ROOM TURNOVER OR (KITS) ×2 IMPLANT
NEEDLE HYPO 18GX1.5 BLUNT FILL (NEEDLE) IMPLANT
NEEDLE HYPO 25X1 1.5 SAFETY (NEEDLE) ×2 IMPLANT
NEEDLE SPNL 20GX3.5 QUINCKE YW (NEEDLE) IMPLANT
NS IRRIG 1000ML POUR BTL (IV SOLUTION) ×2 IMPLANT
PACK LAMINECTOMY NEURO (CUSTOM PROCEDURE TRAY) ×2 IMPLANT
PAD ARMBOARD 7.5X6 YLW CONV (MISCELLANEOUS) ×6 IMPLANT
STAPLER SKIN PROX WIDE 3.9 (STAPLE) ×2 IMPLANT
STRIP CLOSURE SKIN 1/2X4 (GAUZE/BANDAGES/DRESSINGS) ×2 IMPLANT
SUT VIC AB 0 CT1 18XCR BRD8 (SUTURE) ×1 IMPLANT
SUT VIC AB 0 CT1 8-18 (SUTURE) ×1
SUT VIC AB 2-0 CP2 18 (SUTURE) ×2 IMPLANT
SUT VIC AB 3-0 SH 8-18 (SUTURE) ×2 IMPLANT
SWAB CULTURE LIQ STUART DBL (MISCELLANEOUS) ×2 IMPLANT
SYR 20ML ECCENTRIC (SYRINGE) ×2 IMPLANT
SYR 3ML LL SCALE MARK (SYRINGE) IMPLANT
SYR BULB IRRIGATION 50ML (SYRINGE) ×2 IMPLANT
TOWEL OR 17X24 6PK STRL BLUE (TOWEL DISPOSABLE) ×2 IMPLANT
TOWEL OR 17X26 10 PK STRL BLUE (TOWEL DISPOSABLE) ×2 IMPLANT
TUBE ANAEROBIC SPECIMEN COL (MISCELLANEOUS) ×2 IMPLANT
WATER STERILE IRR 1000ML POUR (IV SOLUTION) ×2 IMPLANT

## 2013-12-05 NOTE — Preoperative (Signed)
Beta Blockers   Reason not to administer Beta Blockers:Not Applicable 

## 2013-12-05 NOTE — Progress Notes (Signed)
Patient sent to OR for I&D posterior cervical wound.    Pt has been NPO since 0830 this morning.   Recent VS:   .BP 119/70  Pulse 64  Temp(Src) 97.2 F (36.2 C) (Oral)  Resp 20  Ht 5\' 7"  (1.702 m)  Wt 53.162 kg (117 lb 3.2 oz)  BMI 18.35 kg/m2  SpO2 100%

## 2013-12-05 NOTE — Progress Notes (Signed)
Talked to patient about DCP/ home IV antibiotic x 2 weeks; Home Health Care choices offered, patient chose [facility]; Mary with [facility] called for arrangements; Attending MD at discharge please order HHRN/ IV antibiotics/ dosage/ duration for home health care nurse to follow once the patient is at home; Aneta Mins [phone]

## 2013-12-05 NOTE — Transfer of Care (Signed)
Immediate Anesthesia Transfer of Care Note  Patient: [name] [name]  Procedure(s) Performed: Procedure(s): irrigation and debridement posterior cervical wound (N/A)  Patient Location: PACU  Anesthesia Type:General  Level of Consciousness: awake, alert , oriented and patient cooperative  Airway & Oxygen Therapy: Patient Spontanous Breathing and Patient connected to nasal cannula oxygen  Post-op Assessment: Report given to PACU RN, Post -op Vital signs reviewed and stable and Patient moving all extremities X 4  Post vital signs: Reviewed and stable  Complications: No apparent anesthesia complications

## 2013-12-05 NOTE — Anesthesia Preprocedure Evaluation (Signed)
Anesthesia Evaluation  Patient identified by MRN, date of birth, ID band Patient awake    Reviewed: Allergy & Precautions, H&P , NPO status , Patient's Chart, lab work & pertinent test results, reviewed documented beta blocker date and time   Airway Mallampati: II TM Distance: >3 FB Neck ROM: full    Dental   Pulmonary Current Smoker,  breath sounds clear to auscultation        Cardiovascular negative cardio ROS  Rhythm:regular     Neuro/Psych See HPI negative psych ROS   GI/Hepatic negative GI ROS, Neg liver ROS,   Endo/Other  Hypothyroidism   Renal/GU negative Renal ROS  negative genitourinary   Musculoskeletal   Abdominal   Peds  Hematology negative hematology ROS (+)   Anesthesia Other Findings See surgeon's H&P   Reproductive/Obstetrics negative OB ROS                           Anesthesia Physical Anesthesia Plan  ASA: III  Anesthesia Plan: General   Post-op Pain Management:    Induction: Intravenous  Airway Management Planned: Oral ETT and Video Laryngoscope Planned  Additional Equipment:   Intra-op Plan:   Post-operative Plan: Extubation in OR  Informed Consent: I have reviewed the patients History and Physical, chart, labs and discussed the procedure including the risks, benefits and alternatives for the proposed anesthesia with the patient or authorized representative who has indicated his/her understanding and acceptance.   Dental Advisory Given  Plan Discussed with: CRNA and Surgeon  Anesthesia Plan Comments:         Anesthesia Quick Evaluation

## 2013-12-05 NOTE — Progress Notes (Signed)
Patient ID: [name] [name], female   DOB: [birth year], 53 y.o.   MRN: [id number] Pt ffels great and looks great, but inferior part of incision started draining some pus this morning. Wound had mild erythema, mild tenderness. Her neuro exam is normal. I recommend I&D of wound and 2 weeks of IV abx. Obtain cultures at surgery. She agrees to proceed.

## 2013-12-05 NOTE — Progress Notes (Signed)
ANTIBIOTIC CONSULT NOTE - INITIAL  Pharmacy Consult:  Vancomycin / Rocephin Indication:  Cervical wound  Allergies  Allergen Reactions  . Morphine And Related Nausea Only    Patient Measurements: Height: 5\' 7"  (170.2 cm) Weight: 117 lb 3.2 oz (53.162 kg) IBW/kg (Calculated) : 61.6  Vital Signs: Temp: 97.2 F (36.2 C) (01/08 1647) Temp src: Oral (01/08 1647) BP: 135/77 mmHg (01/08 1647) Pulse Rate: 57 (01/08 1647) Intake/Output from previous day: 01/07 0701 - 01/08 0700 In: 1163.8 [P.O.:240; I.V.:923.8] Out: -  Intake/Output from this shift: Total I/O In: 1345 [P.O.:170; I.V.:1175] Out: 300 [Urine:300]  Labs:  Recent Labs  12/03/13 2018 12/05/13 0908  WBC 17.5* 17.0*  HGB 16.4* 14.2  PLT 285 262  CREATININE 0.53  --    Estimated Creatinine Clearance: 69.1 ml/min (by C-G formula based on Cr of 0.53). No results found for this basename: VANCOTROUGH, Corlis Leak, VANCORANDOM, [place], GENTPEAK, GENTRANDOM, TOBRATROUGH, TOBRAPEAK, TOBRARND, AMIKACINPEAK, AMIKACINTROU, AMIKACIN,  in the last 72 hours   Microbiology: Recent Results (from the past 720 hour(s))  MRSA PCR SCREENING     Status: None   Collection Time    11/15/13 12:05 AM      Result Value Range Status   MRSA by PCR NEGATIVE  NEGATIVE Final   Comment:            The GeneXpert MRSA Assay (FDA     approved for NASAL specimens     only), is one component of a     comprehensive MRSA colonization     surveillance program. It is not     intended to diagnose MRSA     infection nor to guide or     monitor treatment for     MRSA infections.  ANAEROBIC CULTURE     Status: None   Collection Time    12/05/13  2:54 PM      Result Value Range Status   Specimen Description WOUND NECK   Final   Special Requests     Final   Value: PATIENT ON FOLLOWING ROCEPHIN VANCOMYCIN INFECTED WOUND DRAINAGE   Gram Stain PENDING   Incomplete   Culture PENDING   Incomplete   Report Status PENDING   Incomplete  WOUND  CULTURE     Status: None   Collection Time    12/05/13  2:54 PM      Result Value Range Status   Specimen Description WOUND NECK   Final   Special Requests     Final   Value: PATIENT ON FOLLOWING ROCEPHIN VANCOMYCIN INFECTED WOUND DRAINAGE   Gram Stain PENDING   Incomplete   Culture PENDING   Incomplete   Report Status PENDING   Incomplete    Medical History: Past Medical History  Diagnosis Date  . TMJ (dislocation of temporomandibular joint)   . Thyroid disease       Assessment: 20 YOF s/p I&D of posterior suboccipital wound to continue vancomycin and ceftriaxone.  Aware patient received vancomycin 1gm and ceftriaxone 1gm IV around 1500 today.  Baseline labs reviewed.   Goal of Therapy:  Vancomycin trough level 15-20 mcg/ml   Plan:  - Vanc 750mg  IV Q12H - Rocephin 1gm IV Q24H - Monitor renal fxn, clinical course, vanc trough as indicated - F/U KCL supplementation    Jovannie Ulibarri D. Mina Marble, PharmD, BCPS Pager:  [phone] 12/05/2013, 4:59 PM

## 2013-12-05 NOTE — Anesthesia Postprocedure Evaluation (Signed)
Anesthesia Post Note  Patient: [name] [name]  Procedure(s) Performed: Procedure(s) (LRB): irrigation and debridement posterior cervical wound (N/A)  Anesthesia type: General  Patient location: PACU  Post pain: Pain level controlled  Post assessment: Patient's Cardiovascular Status Stable  Last Vitals:  Filed Vitals:   12/05/13 1607  BP:   Pulse:   Temp: 36.3 C  Resp:     Post vital signs: Reviewed and stable  Level of consciousness: alert  Complications: No apparent anesthesia complications

## 2013-12-05 NOTE — Op Note (Signed)
12/03/2013 - 12/05/2013  3:18 PM  PATIENT:  [name] [name]  52 y.o. female  PRE-OPERATIVE DIAGNOSIS:  Posterior suboccipital wound infection  POST-OPERATIVE DIAGNOSIS:  Same  PROCEDURE:  Irrigation and debridement of posterior suboccipital wound  SURGEON:  Sherley Bounds, MD  ASSISTANTS: none  ANESTHESIA:   General  EBL: 30 ml  Total I/O In: 1120 [P.O.:120; I.V.:1000] Out: -   BLOOD ADMINISTERED:none  DRAINS: Medium Hemovac   SPECIMEN:  No Specimen  INDICATION FOR PROCEDURE: This patient underwent a posterior fossa craniectomy for tumor about 2 weeks ago. She started having drainage from her wound today. I recommended irrigation debridement of the wound with culture of the wound. Patient understood the risks, benefits, and alternatives and potential outcomes and wished to proceed.  PROCEDURE DETAILS: The patient was taken to the operating room and after induction of adequate generalized endotracheal anesthesia her head was fixed in the 3-point [place] headrest and she was rolled in the prone position on chest rolls. Her posterior suboccipital incision was cleaned with Hibiclens and then prepped with DuraPrep and then draped in the usual sterile fashion. The inferior two thirds of the old incision was opened with immediate release of pus, and anaerobic and aerobic cultures were obtained from both the superficial and deep tissues. I removed the sutures and identified the suboccipital region as well as the craniectomy. I irrigated with saline solution containing bacitracin. Dried all bleeding points and debrided the wound. I placed a medium Hemovac drain through a separate stab incision. I then closed the muscle and the fascia with 0 Vicryl. I closed the subcutaneous tissues with 2-0 Vicryl in the subcuticular tissues with 0 Vicryl. The skin was closed with staples. The dressing was placed, as was taken out of the [place] headrest and rolled into the supine position on the gurney and  awakened from general anesthesia and transferred to the recovery room in stable condition. At the end of the procedure all sponge needle and instrument counts are correct.  PLAN OF CARE: Admit to inpatient   PATIENT DISPOSITION:  PACU - hemodynamically stable.   Delay start of Pharmacological VTE agent (>24hrs) due to surgical blood loss or risk of bleeding:  yes

## 2013-12-05 NOTE — Anesthesia Procedure Notes (Signed)
Procedure Name: Intubation Date/Time: 12/05/2013 2:24 PM Performed by: Ned Grace Pre-anesthesia Checklist: Patient identified, Patient being monitored, Emergency Drugs available, Timeout performed and Suction available Patient Re-evaluated:Patient Re-evaluated prior to inductionOxygen Delivery Method: Circle system utilized Preoxygenation: Pre-oxygenation with 100% oxygen Intubation Type: IV induction Ventilation: Mask ventilation without difficulty Laryngoscope size: glidescope #4. Grade View: Grade II Tube type: Oral Number of attempts: 1 Airway Equipment and Method: Rigid stylet and Video-laryngoscopy Placement Confirmation: ETT inserted through vocal cords under direct vision,  breath sounds checked- equal and bilateral and positive ETCO2 Secured at: 21 cm Tube secured with: Tape Dental Injury: Teeth and Oropharynx as per pre-operative assessment

## 2013-12-06 ENCOUNTER — Encounter: Payer: Self-pay | Admitting: Radiation Oncology

## 2013-12-06 MED ORDER — SODIUM CHLORIDE 0.9 % IJ SOLN: 10.0000 mL | Freq: Two times a day (BID) | INTRAMUSCULAR | Status: DC

## 2013-12-06 MED ORDER — PANTOPRAZOLE SODIUM 40 MG PO TBEC
40.0000 mg | DELAYED_RELEASE_TABLET | Freq: Every day | ORAL | Status: DC
  Administered 2013-12-06 – 2013-12-08 (×3): 40 mg via ORAL
  Filled 2013-12-06 (×2): qty 1

## 2013-12-06 MED ORDER — SODIUM CHLORIDE 0.9 % IJ SOLN
10.0000 mL | INTRAMUSCULAR | Status: DC | PRN
  Administered 2013-12-09: 10 mL

## 2013-12-06 NOTE — Progress Notes (Signed)
Peripherally Inserted Central Catheter/Midline Placement  The IV Nurse has discussed with the patient and/or persons authorized to consent for the patient, the purpose of this procedure and the potential benefits and risks involved with this procedure.  The benefits include less needle sticks, lab draws from the catheter and patient may be discharged home with the catheter.  Risks include, but not limited to, infection, bleeding, blood clot (thrombus formation), and puncture of an artery; nerve damage and irregular heat beat.  Alternatives to this procedure were also discussed.  PICC/Midline Placement Documentation  PICC / Midline Single Lumen 58/68/25 PICC Left Basilic 41 cm 0 cm (Active)  Indication for Insertion or Continuance of Line Home intravenous therapies (PICC only) 12/06/2013  9:54 AM  Exposed Catheter (cm) 0 cm 12/06/2013  9:54 AM  Line Status Flushed;Saline locked;Blood return noted 12/06/2013  9:54 AM  Dressing Intervention New dressing 12/06/2013  9:54 AM  Dressing Change Due 12/13/13 12/06/2013  9:54 AM       Gordan Payment 12/06/2013, 9:56 AM

## 2013-12-06 NOTE — Progress Notes (Signed)
Patient ID: [name] [name], female   DOB: 1961-09-12, 53 y.o.   MRN: [id number] Subjective: Patient reports neck soreness. No headache or N/V  Objective: Vital signs in last 24 hours: Temp:  [96.7 F (35.9 C)-97.9 F (36.6 C)] 97.6 F (36.4 C) (01/09 2505) Pulse Rate:  [53-67] 55 (01/09 0619) Resp:  [13-20] 18 (01/09 0619) BP: (119-135)/(62-93) 130/78 mmHg (01/09 0619) SpO2:  [97 %-100 %] 99 % (01/09 0619)  Intake/Output from previous day: 01/08 0701 - 01/09 0700 In: 1345 [P.O.:170; I.V.:1175] Out: 320 [Urine:300; Drains:20] Intake/Output this shift:    Neurologic: Grossly normal  Lab Results: Lab Results  Component Value Date   WBC 17.0* 12/05/2013   HGB 14.2 12/05/2013   HCT 41.2 12/05/2013   MCV 93.2 12/05/2013   PLT 262 12/05/2013   Lab Results  Component Value Date   INR 1.07 11/15/2013   BMET Lab Results  Component Value Date   NA 131* 12/03/2013   K 3.6* 12/03/2013   CL 89* 12/03/2013   CO2 25 12/03/2013   GLUCOSE 135* 12/03/2013   BUN 23 12/03/2013   CREATININE 0.53 12/03/2013   CALCIUM 9.7 12/03/2013    Studies/Results: No results found.  Assessment/Plan: Doing well. Continue Abx and await cultures. PICC line today for 2 weeks IV abx   LOS: 3 days    Mansoor Hillyard S 12/06/2013, 7:43 AM

## 2013-12-06 NOTE — Progress Notes (Signed)
Location/Histology of Brain Tumor: cerebellar tumor  Patient presented with symptoms of:  Presented to the ED in December with balance issues, change in handwriting, nausea, vomiting, and headache  Past or anticipated interventions, if any, per neurosurgery: suboccipital craniectomy   Past or anticipated interventions, if any, per medical oncology: None at this time  Dose of Decadron, if applicable: Decadron 2 mg once per day  Recent neurologic symptoms, if any:   Seizures: None noted  Headaches: improved since surgery and with decadron  Nausea: at presentation but, since resolved  Dizziness/ataxia: None noted  Difficulty with hand coordination: None noted  Focal numbness/weakness: None noted  Visual deficits/changes: Denied  Confusion/Memory deficits: None noted  Painful bone metastases at present, if any: None  SAFETY ISSUES:  Prior radiation? NO  Pacemaker/ICD? NO  Possible current pregnancy? NO  Is the patient on methotrexate? NO  Additional Complaints / other details: 53 year old female. Heavy everyday smoker. Incision began draining some pus on 12/05/13 with erythema and tenderness. I&D done followed by two weeks of IV antibiotics.

## 2013-12-07 NOTE — Progress Notes (Signed)
Patient ID: [name] [name], female   DOB: 1960-12-23, 53 y.o.   MRN: [id number] BP 114/73  Pulse 60  Temp(Src) 97.9 F (36.6 C) (Oral)  Resp 18  Ht 5\' 7"  (1.702 m)  Wt 53.162 kg (117 lb 3.2 oz)  BMI 18.35 kg/m2  SpO2 100% Alert and oriented x 4 speech is clear and fluent Perrl, full eom Symmetric facial sensation, and movement Wound is clean, and dry Will remove drain this morning. Possible dischArge

## 2013-12-08 LAB — WOUND CULTURE

## 2013-12-08 LAB — VANCOMYCIN, TROUGH: VANCOMYCIN TR: 5.8 ug/mL — AB (ref 10.0–20.0)

## 2013-12-08 MED ORDER — VANCOMYCIN HCL IN DEXTROSE 750-5 MG/150ML-% IV SOLN
750.0000 mg | Freq: Three times a day (TID) | INTRAVENOUS | Status: DC
  Administered 2013-12-09 (×2): 750 mg via INTRAVENOUS
  Filled 2013-12-08 (×4): qty 150

## 2013-12-08 MED ORDER — VANCOMYCIN HCL IN DEXTROSE 1-5 GM/200ML-% IV SOLN
1000.0000 mg | INTRAVENOUS | Status: AC
  Administered 2013-12-08: 1000 mg via INTRAVENOUS
  Filled 2013-12-08: qty 200

## 2013-12-08 NOTE — Progress Notes (Signed)
Doing well. C/o appropriate incisional soreness.   Temp:  [97.4 F (36.3 C)-97.9 F (36.6 C)] 97.7 F (36.5 C) (01/11 0213) Pulse Rate:  [55-64] 63 (01/11 0213) Resp:  [16-18] 18 (01/11 0213) BP: (114-136)/(64-78) 124/68 mmHg (01/11 0213) SpO2:  [99 %-100 %] 99 % (01/11 0213) Neuro intact Incision CDI   Cultures + staph aureus , sensitivities pending Plan: Await final culture results and then d/c with home IV abx

## 2013-12-09 ENCOUNTER — Ambulatory Visit: Payer: 59

## 2013-12-09 ENCOUNTER — Ambulatory Visit
Admission: RE | Admit: 2013-12-09 | Discharge: 2013-12-09 | Disposition: A | Discharge status: Home / Self Care | Payer: 59 | Source: Ambulatory Visit | Attending: Radiation Oncology | Admitting: Radiation Oncology

## 2013-12-09 ENCOUNTER — Encounter: Payer: Self-pay | Admitting: Radiation Oncology

## 2013-12-09 DIAGNOSIS — G9389 Other specified disorders of brain: Secondary | ICD-10-CM

## 2013-12-09 MED ORDER — HEPARIN SOD (PORK) LOCK FLUSH 100 UNIT/ML IV SOLN
250.0000 [IU] | INTRAVENOUS | Status: AC | PRN
  Administered 2013-12-09: 500 [IU]

## 2013-12-09 MED ORDER — DEXAMETHASONE 2 MG PO TABS: 2.0000 mg | ORAL_TABLET | Freq: Three times a day (TID) | ORAL | Status: DC

## 2013-12-09 MED ORDER — CEFAZOLIN SODIUM-DEXTROSE 2-3 GM-% IV SOLR
2.0000 g | Freq: Three times a day (TID) | INTRAVENOUS | Status: DC
  Administered 2013-12-09: 2 g via INTRAVENOUS
  Filled 2013-12-09 (×3): qty 50

## 2013-12-09 NOTE — Progress Notes (Signed)
D/C instructions given to pt. and verbalized understanding.

## 2013-12-09 NOTE — Progress Notes (Addendum)
ANTIBIOTIC CONSULT NOTE - FOLLOW UP  Pharmacy Consult:  Vancomycin Indication:  Cervical wound  Allergies  Allergen Reactions  . Morphine And Related Nausea Only    Patient Measurements: Height: 5\' 7"  (170.2 cm) Weight: 117 lb 3.2 oz (53.162 kg) IBW/kg (Calculated) : 61.6  Vital Signs: Temp: 97.8 F (36.6 C) (01/12 1425) Temp src: Oral (01/12 1425) BP: 116/71 mmHg (01/12 1425) Pulse Rate: 61 (01/12 1425)  Labs: No results found for this basename: WBC, HGB, PLT, LABCREA, CREATININE,  in the last 72 hours Estimated Creatinine Clearance: 69.1 ml/min (by C-G formula based on Cr of 0.53).  Recent Labs  12/08/13 1445  VANCOTROUGH 5.8*     Microbiology: Recent Results (from the past 720 hour(s))  MRSA PCR SCREENING     Status: None   Collection Time    11/15/13 12:05 AM      Result Value Range Status   MRSA by PCR NEGATIVE  NEGATIVE Final   Comment:            The GeneXpert MRSA Assay (FDA     approved for NASAL specimens     only), is one component of a     comprehensive MRSA colonization     surveillance program. It is not     intended to diagnose MRSA     infection nor to guide or     monitor treatment for     MRSA infections.  ANAEROBIC CULTURE     Status: None   Collection Time    12/05/13  2:54 PM      Result Value Range Status   Specimen Description WOUND NECK   Final   Special Requests     Final   Value: PATIENT ON FOLLOWING ROCEPHIN VANCOMYCIN INFECTED WOUND DRAINAGE   Gram Stain     Final   Value: FEW WBC PRESENT,BOTH PMN AND MONONUCLEAR     NO SQUAMOUS EPITHELIAL CELLS SEEN     FEW GRAM POSITIVE COCCI     IN PAIRS     Performed at [facility]   Culture     Final   Value: NO ANAEROBES ISOLATED; CULTURE IN PROGRESS FOR 5 DAYS     Performed at [facility]   Report Status PENDING   Incomplete  WOUND CULTURE     Status: None   Collection Time    12/05/13  2:54 PM      Result Value Range Status   Specimen Description WOUND NECK    Final   Special Requests     Final   Value: PATIENT ON FOLLOWING ROCEPHIN VANCOMYCIN INFECTED WOUND DRAINAGE   Gram Stain     Final   Value: FEW WBC PRESENT,BOTH PMN AND MONONUCLEAR     NO SQUAMOUS EPITHELIAL CELLS SEEN     FEW GRAM POSITIVE COCCI     IN PAIRS     Performed at [facility]   Culture     Final   Value: MODERATE STAPHYLOCOCCUS AUREUS     Note: RIFAMPIN AND GENTAMICIN SHOULD NOT BE USED AS SINGLE DRUGS FOR TREATMENT OF STAPH INFECTIONS.     Performed at [facility]   Report Status 12/08/2013 FINAL   Final   Organism ID, Bacteria STAPHYLOCOCCUS AUREUS   Final      Assessment: 17 YOF s/p I&D of MSSA posterior suboccipital wound infection to continue vancomycin.  Vancomycin trough was sub-therapeutic and dose was adjusted.  Last BMET checked on 12/03/13 but it has  been stable.  Vanc 1/8 >> CTX 1/8 >> 1/9  1/11 VT = 5.8 mcg/mL on 750 q12 >> incr to 750 q8h  1/8: Neck wound cx - MSSA (oxacillin sensitive)   Goal of Therapy:  Vancomycin trough level 15-20 mcg/ml   Plan:  - Continue vanc 750mg  IV Q8H - Monitor renal fxn, vanc trough if abx not adjusted - Recommend changing abx to Ancef 1gm IV Q8H or Rocephin 2gm IV Q24H for MSSA infection    Kourtney Terriquez D. Mina Marble, PharmD, BCPS Pager:  [phone] 12/09/2013, 2:47 PM

## 2013-12-09 NOTE — Discharge Summary (Signed)
Physician Discharge Summary  Patient ID: [name] [name] MRN: [id number] DOB/AGE: [birth year] 52 y.o.  Admit date: 12/03/2013 Discharge date: 12/09/2013  Admission Diagnoses: wound infection   Discharge Diagnoses: same   Discharged Condition: good  Hospital Course: The patient was admitted on 12/03/2013 with persistent emesis 2 weeks after suboccipital craniectomy for tumor. She improved with decadron and hydration, but on HD 2 she began to drain from her wound. She was taken to the operating room where the patient underwent I&D of her wound. The patient tolerated the procedure well and was taken to the recovery room and then to the floor in stable condition. The hospital course was routine. She was put on empiric antibiotics until cultures revealed Staph sensitive to Ancef. The wound remained clean dry and intact. Pt had appropriate neck soreness. The patient remained afebrile with stable vital signs, and tolerated a regular diet. The patient continued to increase activities, and pain was well controlled with oral pain medications. Sent home with IV Ancef and scheduled f/u with ID.  Consults: None  Significant Diagnostic Studies:  Results for orders placed during the hospital encounter of 12/03/13  ANAEROBIC CULTURE      Result Value Range   Specimen Description WOUND NECK     Special Requests       Value: PATIENT ON FOLLOWING ROCEPHIN VANCOMYCIN INFECTED WOUND DRAINAGE   Gram Stain       Value: FEW WBC PRESENT,BOTH PMN AND MONONUCLEAR     NO SQUAMOUS EPITHELIAL CELLS SEEN     FEW GRAM POSITIVE COCCI     IN PAIRS     Performed at [facility]   Culture       Value: NO ANAEROBES ISOLATED; CULTURE IN PROGRESS FOR 5 DAYS     Performed at [facility]   Report Status PENDING    WOUND CULTURE      Result Value Range   Specimen Description WOUND NECK     Special Requests       Value: PATIENT ON FOLLOWING ROCEPHIN VANCOMYCIN INFECTED WOUND DRAINAGE   Gram Stain        Value: FEW WBC PRESENT,BOTH PMN AND MONONUCLEAR     NO SQUAMOUS EPITHELIAL CELLS SEEN     FEW GRAM POSITIVE COCCI     IN PAIRS     Performed at [facility]   Culture       Value: MODERATE STAPHYLOCOCCUS AUREUS     Note: RIFAMPIN AND GENTAMICIN SHOULD NOT BE USED AS SINGLE DRUGS FOR TREATMENT OF STAPH INFECTIONS.     Performed at [facility]   Report Status 12/08/2013 FINAL     Organism ID, Bacteria STAPHYLOCOCCUS AUREUS    BASIC METABOLIC PANEL      Result Value Range   Sodium 131 (*) 137 - 147 mEq/L   Potassium 3.6 (*) 3.7 - 5.3 mEq/L   Chloride 89 (*) 96 - 112 mEq/L   CO2 25  19 - 32 mEq/L   Glucose, Bld 135 (*) 70 - 99 mg/dL   BUN 23  6 - 23 mg/dL   Creatinine, Ser 0.53  0.50 - 1.10 mg/dL   Calcium 9.7  8.4 - 10.5 mg/dL   GFR calc non Af Amer >90  >90 mL/min   GFR calc Af Amer >90  >90 mL/min  CBC      Result Value Range   WBC 17.5 (*) 4.0 - 10.5 K/uL   RBC 5.01  3.87 - 5.11 MIL/uL  Hemoglobin 16.4 (*) 12.0 - 15.0 g/dL   HCT 45.8  36.0 - 46.0 %   MCV 91.4  78.0 - 100.0 fL   MCH 32.7  26.0 - 34.0 pg   MCHC 35.8  30.0 - 36.0 g/dL   RDW 14.0  11.5 - 15.5 %   Platelets 285  150 - 400 K/uL  CBC      Result Value Range   WBC 17.0 (*) 4.0 - 10.5 K/uL   RBC 4.42  3.87 - 5.11 MIL/uL   Hemoglobin 14.2  12.0 - 15.0 g/dL   HCT 41.2  36.0 - 46.0 %   MCV 93.2  78.0 - 100.0 fL   MCH 32.1  26.0 - 34.0 pg   MCHC 34.5  30.0 - 36.0 g/dL   RDW 13.7  11.5 - 15.5 %   Platelets 262  150 - 400 K/uL  VANCOMYCIN, TROUGH      Result Value Range   Vancomycin Tr 5.8 (*) 10.0 - 20.0 ug/mL    Ct Head Wo Contrast  12/04/2013   CLINICAL DATA:  Recent craniotomy for cerebellar tumor resection presenting with nausea, vomiting and headaches.  EXAM: CT HEAD WITHOUT CONTRAST  TECHNIQUE: Contiguous axial images were obtained from the base of the skull through the vertex without intravenous contrast.  COMPARISON:  11/19/2013  FINDINGS: Sequelae of interval right suboccipital  craniotomy for right cerebellar mass resection are identified. Resection cavity is present in the right cerebellar hemisphere. Cerebellar edema has nearly completely resolved, and mass effect on the 4th ventricle has resolved. A small amount of fluid/postoperative change is noted within the soft tissues overlying the craniotomy site. There is no evidence of acute large territory infarct, midline shift, intracranial hemorrhage, or extra-axial fluid collection. Ventricles and sulci are normal. Orbits are unremarkable. Mastoid air cells and visualized paranasal sinuses are clear.  IMPRESSION: Sequelae of interval right cerebellar mass resection with near complete resolution of cerebellar edema and mass effect on the 4th ventricle. No evidence of acute intracranial abnormality.   Electronically Signed   By: Logan Bores   On: 12/04/2013 07:44   Ct Head Wo Contrast  11/14/2013   CLINICAL DATA:  Dizziness.  EXAM: CT HEAD WITHOUT CONTRAST  TECHNIQUE: Contiguous axial images were obtained from the base of the skull through the vertex without intravenous contrast.  COMPARISON:  None.  FINDINGS: An 8 mm focus of hyperdensity is present within the right cerebellum with significant surrounding edema. This creates mass effect on the 4th ventricle with midline shift and partial effacement. The basal cisterns are partially effaced as well.  The supratentorial brain is within normal limits. No acute cortical infarct or mass lesion is present. No other hemorrhage is evident. The lateral ventricles are of normal size. No significant extra-axial fluid collection is present.  IMPRESSION: 1. 8 mm hyperdensity in within the right cerebellum concerning for hemorrhage or mass lesion with significant surrounding vasogenic edema. MRI of the brain without and with contrast is recommended for further evaluation. 2. No significant supratentorial lesions are evident. Critical Value/emergent results were called by telephone at the time of  interpretation on 11/14/2013 at 4:01 PM to Dr. Betsey Holiday , who verbally acknowledged these results.   Electronically Signed   By: Lawrence Santiago M.D.   On: 11/14/2013 16:01   Ct Head W Contrast  11/19/2013   CLINICAL DATA:  53 year old female with cerebellar mass. Study for stereotactic surgical planning requested. Initial encounter.  EXAM: CT HEAD WITH CONTRAST  TECHNIQUE:  Contiguous axial images were obtained from the base of the skull through the vertex with intravenous contrast.  CONTRAST:  85mL OMNIPAQUE IOHEXOL 300 MG/ML  SOLN  COMPARISON:  Brain MRI 11/14/2013.  FINDINGS: Decreased size of the round enhancing component of the right cerebellar mass since 11/14/2013. This now measures 10-11 mm diameter, previously 17 mm). See series 2, images 37 and 38. Likewise, hypodensity in the right cerebellum appears mildly decreased along with improved patency of the 4th ventricle suggesting interval regression of cerebellar edema.  No other abnormal enhancement identified in the brain. Stable supratentorial structures. No ventriculomegaly. Major intracranial vascular structures appear to be normally enhancing. There are multiple scalp soft tissue markers in place. Visualized orbit soft tissues are within normal limits. Visualized paranasal sinuses and mastoids are clear. No acute osseous abnormality identified.  IMPRESSION: 1. Study for stereotactic surgical planning. Enhancing component of the right cerebellar lesion has mildly decreased, now 10-11 mm diameter on series 2 images 37 and 38.  2. Mild regression of cerebellar edema and improved patency of the 4th ventricle.   Electronically Signed   By: Lars Pinks M.D.   On: 11/19/2013 11:37   Ct Chest W Contrast  11/15/2013   CLINICAL DATA:  Cerebellar lesion. Question metastasis. History of tobacco use and thyroid disease.  EXAM: CT CHEST, ABDOMEN, AND PELVIS WITH CONTRAST  TECHNIQUE: Multidetector CT imaging of the chest, abdomen and pelvis was performed following  the standard protocol during bolus administration of intravenous contrast.  CONTRAST:  83mL OMNIPAQUE IOHEXOL 300 MG/ML  SOLN  COMPARISON:  Brain MRI 11/14/2013  FINDINGS: CT CHEST FINDINGS  No axillary or supraclavicular lymphadenopathy. No mediastinal hilar lymphadenopathy. No pericardial fluid. No central pulmonary embolism. The thyroid gland is diminutive.  Review of the lung parenchyma demonstrates a smudgy nodule (sub solid) measuring 6 mm in the posterior aspect of the superior segment of the right lower lobe (image 22). There is a small spiculated nodule measuring 5 mm in superior segment left lower lobe (image 21). There is mild medial atelectasis the lung bases.  CT ABDOMEN AND PELVIS FINDINGS  No focal hepatic lesion. Gallbladder, pancreas, spleen, adrenal glands, and kidneys are normal. Small nonobstructing calculus in the right kidney  The stomach, small bowel, and cecum normal. The colon rectosigmoid colon normal without evidence of mass lesion or obstruction.  The abdominal aorta is normal caliber. There is no retroperitoneal retrocrural adenopathy. No mesenteric periportal adenopathy.  No free fluid the pelvis. The uterus and bladder normal. Ovaries are normal. No aggressive osseous lesion. No pelvic lymphadenopathy.  IMPRESSION: 1. Small spiculated nodule in the superior segment of the left lower lobe in the setting of the intracranial metastasis is concerning for metastatic lesion versus less likely primary lesion. 2. Sub solid nodule in the right lower lobe is nonspecific. 3. No evidence of primary  lesion in the chest abdomen or pelvis.   Electronically Signed   By: Suzy Bouchard M.D.   On: 11/15/2013 14:29   Mr Jeri Cos EG Contrast  11/14/2013   CLINICAL DATA:  Dizziness.  Emesis.  EXAM: MRI HEAD WITHOUT AND WITH CONTRAST  TECHNIQUE: Multiplanar, multiecho pulse sequences of the brain and surrounding structures were obtained without and with intravenous contrast.  CONTRAST:  87mL  MULTIHANCE GADOBENATE DIMEGLUMINE 529 MG/ML IV SOLN  COMPARISON:  CT head earlier in the day at 1552 hr.  FINDINGS: As suspected from CT, there is a right cerebellar hemispheric mass with marked surrounding edema. The lesion contains a  nonenhancing T2 hypointense component roughly 1 cm in size, best displayed on coronal T2 weighted images. The enhancing portion of the mass is somewhat larger, measuring 16 x 17 x 13 mm, appears infiltrative, with vasogenic edema spreading into the vermis and displacing the 4th ventricle approximately 2 mm right to left. The 4th ventricle is sufficiently compressed the raise concern of impending hydrocephalus should the edema worsen. No other intracranial lesions are seen.  There are no areas of acute stroke, extra-axial fluid, or hydrocephalus. Calvarium intact without osseous lesions. No craniocervical junction or upper cervical abnormality. No sinus or mastoid disease. Negative orbits. Intracranial vasculature widely patent.  Compared with the earlier CT, the appearance is not significantly worsened.  IMPRESSION: 16 x 17 x 13 mm right cerebellar infiltrative mass with marked surrounding edema. Centrally there is a component of predominantly T2 shortening suggesting acute blood or melanin. Calcification/mineralization felt less likely. Metastases which could present with this constellation of imaging findings include melanoma, hemorrhagic lung, breast, renal cell, or thyroid carcinoma, versus a primary cerebellar brain tumor such as glioma. Cerebral abscess is not favored.  Significant vasogenic edema in the setting of a possibly acutely hemorrhagic mass is concerning. The patient should be carefully observed in a neuro ICU setting for signs of increased intracranial pressure should the lesion worsen due to increasing hemorrhage or obstruction of the 4th ventricle.  Findings discussed with ordering provider at the time of dictation.   Electronically Signed   By: Rolla Flatten M.D.    On: 11/14/2013 19:25   Ct Abdomen Pelvis W Contrast  11/15/2013   CLINICAL DATA:  Cerebellar lesion. Question metastasis. History of tobacco use and thyroid disease.  EXAM: CT CHEST, ABDOMEN, AND PELVIS WITH CONTRAST  TECHNIQUE: Multidetector CT imaging of the chest, abdomen and pelvis was performed following the standard protocol during bolus administration of intravenous contrast.  CONTRAST:  55mL OMNIPAQUE IOHEXOL 300 MG/ML  SOLN  COMPARISON:  Brain MRI 11/14/2013  FINDINGS: CT CHEST FINDINGS  No axillary or supraclavicular lymphadenopathy. No mediastinal hilar lymphadenopathy. No pericardial fluid. No central pulmonary embolism. The thyroid gland is diminutive.  Review of the lung parenchyma demonstrates a smudgy nodule (sub solid) measuring 6 mm in the posterior aspect of the superior segment of the right lower lobe (image 22). There is a small spiculated nodule measuring 5 mm in superior segment left lower lobe (image 21). There is mild medial atelectasis the lung bases.  CT ABDOMEN AND PELVIS FINDINGS  No focal hepatic lesion. Gallbladder, pancreas, spleen, adrenal glands, and kidneys are normal. Small nonobstructing calculus in the right kidney  The stomach, small bowel, and cecum normal. The colon rectosigmoid colon normal without evidence of mass lesion or obstruction.  The abdominal aorta is normal caliber. There is no retroperitoneal retrocrural adenopathy. No mesenteric periportal adenopathy.  No free fluid the pelvis. The uterus and bladder normal. Ovaries are normal. No aggressive osseous lesion. No pelvic lymphadenopathy.  IMPRESSION: 1. Small spiculated nodule in the superior segment of the left lower lobe in the setting of the intracranial metastasis is concerning for metastatic lesion versus less likely primary lesion. 2. Sub solid nodule in the right lower lobe is nonspecific. 3. No evidence of primary  lesion in the chest abdomen or pelvis.   Electronically Signed   By: Suzy Bouchard M.D.    On: 11/15/2013 14:29   Dg Abd Acute W/chest  12/01/2013   CLINICAL DATA:  Abdominal pain and swelling. No bowel movement for 1 week. History  of appendectomy.  EXAM: ACUTE ABDOMEN SERIES (ABDOMEN 2 VIEW & CHEST 1 VIEW)  COMPARISON:  Chest, Abdominal pelvic CT 11/15/2013.  FINDINGS: The heart size and mediastinal contours are normal. The lungs demonstrate mild interstitial prominence but no focal airspace disease or apparent nodularity to correspond with the recent CT. There is no pleural effusion.  There is a large amount of stool throughout the colon. There is no significant small bowel distension. The stomach is mildly distended. There is a tiny nonobstructing right renal calculus. No acute osseous findings are evident.  IMPRESSION: 1. Large amount of stool throughout the colon consistent with constipation. No evidence of small bowel obstruction. 2. No acute cardiopulmonary process.   Electronically Signed   By: Camie Patience M.D.   On: 12/01/2013 12:10    Antibiotics:  Anti-infectives   Start     Dose/Rate Route Frequency Ordered Stop   12/09/13 1800  ceFAZolin (ANCEF) IVPB 2 g/50 mL premix     2 g 100 mL/hr over 30 Minutes Intravenous Every 8 hours 12/09/13 1621     12/09/13 0100  vancomycin (VANCOCIN) IVPB 750 mg/150 ml premix  Status:  Discontinued     750 mg 150 mL/hr over 60 Minutes Intravenous Every 8 hours 12/08/13 1625 12/09/13 1634   12/08/13 1700  vancomycin (VANCOCIN) IVPB 1000 mg/200 mL premix     1,000 mg 200 mL/hr over 60 Minutes Intravenous NOW 12/08/13 1625 12/08/13 1910   12/06/13 0300  vancomycin (VANCOCIN) IVPB 750 mg/150 ml premix  Status:  Discontinued     750 mg 150 mL/hr over 60 Minutes Intravenous Every 12 hours 12/05/13 1700 12/08/13 1625   12/06/13 0300  cefTRIAXone (ROCEPHIN) 1 g in dextrose 5 % 50 mL IVPB  Status:  Discontinued     1 g 100 mL/hr over 30 Minutes Intravenous Every 24 hours 12/05/13 1700 12/06/13 1330   12/05/13 1422  bacitracin 50,000 Units in  sodium chloride irrigation 0.9 % 500 mL irrigation  Status:  Discontinued       As needed 12/05/13 1422 12/05/13 1526   12/05/13 1415  cefTRIAXone (ROCEPHIN) 1 g in dextrose 5 % 50 mL IVPB  Status:  Discontinued     1 g 100 mL/hr over 30 Minutes Intravenous To Neuro OR-Station #32 12/05/13 1408 12/05/13 1647   12/05/13 1405  vancomycin (VANCOCIN) 1 GM/200ML IVPB    Comments:  [place], Brandy   : cabinet override      12/05/13 1405 12/05/13 1552      Discharge Exam: Blood pressure 116/71, pulse 61, temperature 97.8 F (36.6 C), temperature source Oral, resp. rate 18, height 5\' 7"  (1.702 m), weight 53.162 kg (117 lb 3.2 oz), SpO2 99.00%. Neurologic: Grossly normal Incision CDI  Discharge Medications:     Medication List         acetaminophen-codeine 300-30 MG per tablet  Commonly known as:  TYLENOL #3  Take 1-2 tablets by mouth every 4 (four) hours as needed for moderate pain.     dexamethasone 2 MG tablet  Commonly known as:  DECADRON  Take 1 tablet (2 mg total) by mouth 3 (three) times daily. Starting 11/23/13:  Take 1 tablet (2 mg)  four times daily for three days, then take one tablet twice daily for three days, then take one tablet daily until further instructions from md.     famotidine 20 MG tablet  Commonly known as:  PEPCID  Take 1 tablet (20 mg total) by mouth 2 (two) times daily.  levothyroxine 125 MCG tablet  Commonly known as:  SYNTHROID, LEVOTHROID  Take 125 mcg by mouth daily before breakfast.     methocarbamol 500 MG tablet  Commonly known as:  ROBAXIN  Take 250-500 mg by mouth every 6 (six) hours as needed for muscle spasms.     nicotine 14 mg/24hr patch  Commonly known as:  NICODERM CQ - dosed in mg/24 hours  Place 1 patch (14 mg total) onto the skin daily.     piroxicam 20 MG capsule  Commonly known as:  FELDENE  Take 20 mg by mouth daily.        Disposition: home   Final Dx: wound infection      Discharge Orders   Future Appointments  Provider Department Dept Phone   12/16/2013 9:00 AM Campbell Riches, MD [facility] [phone]   12/16/2013 2:30 PM [place] [phone]   12/16/2013 3:00 PM Lora Paula, MD [place] [phone]   Future Orders Complete By Expires   Call MD for:  difficulty breathing, headache or visual disturbances  As directed    Call MD for:  hives  As directed    Call MD for:  persistant dizziness or light-headedness  As directed    Call MD for:  persistant nausea and vomiting  As directed    Call MD for:  redness, tenderness, or signs of infection (pain, swelling, redness, odor or green/yellow discharge around incision site)  As directed    Call MD for:  temperature >100.4  As directed    Diet - low sodium heart healthy  As directed    Increase activity slowly  As directed       Follow-up Information   Follow up with Eustace Moore, MD In 8 days.   Specialty:  Neurosurgery   Contact information:   [address] [place] [address] [phone]        Signed: Eustace Moore 12/09/2013, 5:58 PM

## 2013-12-09 NOTE — Progress Notes (Signed)
CM following for DCP; patient plans to return home with [facility] provided by [facility] at discharge; awaiting for IV antibiotic orders for [facility] administration; Aneta Mins [phone]

## 2013-12-09 NOTE — Progress Notes (Signed)
Noted the possibility of changing home IV antibiotic; CM informed nurse that if a new IV antibiotic is ordered, the patient would have to received the first dose in the hospital ( to observe for a reaction) and the [facility] will continue the IV antibiotic at home; Aneta Mins [phone]

## 2013-12-09 NOTE — Progress Notes (Signed)
Spoke with Dr. Ronnald Ramp for recommendations on MSSA subfascial wound infection. Recommended that the patient be switched from vancomycin to cefazolin 2gm IV Q 8hr. We will see in her ID clinic in 1-2 wks as well as completion of antibiotics at 4wks. Patient is being set for discharge shortly. I have spoken to home health coordinator.

## 2013-12-10 ENCOUNTER — Encounter (HOSPITAL_COMMUNITY): Payer: Self-pay | Admitting: Neurological Surgery

## 2013-12-10 LAB — ANAEROBIC CULTURE

## 2013-12-16 ENCOUNTER — Ambulatory Visit
Admit: 2013-12-16 | Discharge: 2013-12-16 | Disposition: A | Discharge status: Home / Self Care | Payer: 59 | Attending: Radiation Oncology | Admitting: Radiation Oncology

## 2013-12-16 ENCOUNTER — Encounter: Payer: Self-pay | Admitting: Infectious Diseases

## 2013-12-16 ENCOUNTER — Ambulatory Visit (INDEPENDENT_AMBULATORY_CARE_PROVIDER_SITE_OTHER): Payer: 59 | Admitting: Infectious Diseases

## 2013-12-16 ENCOUNTER — Telehealth: Payer: Self-pay | Admitting: *Deleted

## 2013-12-16 ENCOUNTER — Encounter: Payer: Self-pay | Admitting: Radiation Oncology

## 2013-12-16 VITALS — BP 105/67 | HR 73 | Temp 97.9°F | Ht 67.0 in | Wt 119.0 lb

## 2013-12-16 VITALS — BP 109/70 | HR 70 | Temp 98.0°F | Resp 16 | Ht 67.0 in | Wt 120.3 lb

## 2013-12-16 DIAGNOSIS — Z720 Tobacco use: Secondary | ICD-10-CM

## 2013-12-16 DIAGNOSIS — D496 Neoplasm of unspecified behavior of brain: Secondary | ICD-10-CM

## 2013-12-16 DIAGNOSIS — R911 Solitary pulmonary nodule: Secondary | ICD-10-CM | POA: Insufficient documentation

## 2013-12-16 DIAGNOSIS — G9389 Other specified disorders of brain: Secondary | ICD-10-CM

## 2013-12-16 DIAGNOSIS — N200 Calculus of kidney: Secondary | ICD-10-CM | POA: Insufficient documentation

## 2013-12-16 DIAGNOSIS — G936 Cerebral edema: Secondary | ICD-10-CM | POA: Insufficient documentation

## 2013-12-16 DIAGNOSIS — Z87891 Personal history of nicotine dependence: Secondary | ICD-10-CM | POA: Insufficient documentation

## 2013-12-16 DIAGNOSIS — B370 Candidal stomatitis: Secondary | ICD-10-CM

## 2013-12-16 DIAGNOSIS — T8140XA Infection following a procedure, unspecified, initial encounter: Secondary | ICD-10-CM

## 2013-12-16 DIAGNOSIS — Z79899 Other long term (current) drug therapy: Secondary | ICD-10-CM | POA: Insufficient documentation

## 2013-12-16 DIAGNOSIS — E079 Disorder of thyroid, unspecified: Secondary | ICD-10-CM | POA: Insufficient documentation

## 2013-12-16 DIAGNOSIS — T8149XA Infection following a procedure, other surgical site, initial encounter: Secondary | ICD-10-CM

## 2013-12-16 DIAGNOSIS — F17200 Nicotine dependence, unspecified, uncomplicated: Secondary | ICD-10-CM

## 2013-12-16 HISTORY — DX: Candidal stomatitis: B37.0

## 2013-12-16 MED ORDER — FLUCONAZOLE 100 MG PO TABS: 100.0000 mg | ORAL_TABLET | Freq: Every day | ORAL | Status: DC

## 2013-12-16 NOTE — Progress Notes (Signed)
   Subjective:    Patient ID: [name] [name], female    DOB: December 22, 1960, 53 y.o.   MRN: [id number]  HPI 53 yo F with hx of resection of cerebellar tumor on 11-19-13 (Path ending). Her CT chest/abd showed:1. Small spiculated nodule in the superior segment of the left lower lobe in the setting of the intracranial metastasis is concerning for metastatic lesion versus less likely primary lesion.  2. Sub solid nodule in the right lower lobe is nonspecific.  3. No evidence of primary lesion in the chest abdomen or pelvis.   She returned to hospital on 12-03-13 with n/v, headaches. She was on decardron. Her CT showed expected post-op changes and some fluid over the op site. By hospital day 2 she developed wound d/c. On 1-8 she was taken to OR and underwent debridement of wound infection. Her Cx grew MSSA. She was started on vancomycin, then changed to Ancef. She was d/c home on 1-12 with plan for 1 month of therapy. She continues on decadron.  Today states that she is doing well except for back pain. No problems with PIC line. Her only concern is loss of strength (going up stairs, getting up from squat). Wants to know if she is contagious.  Soc/FHx reviewed.  Quit smoking- was prev smoking 1 carton/week since 53 yo. Using patches, occasional vaporizer.   Review of Systems  Constitutional: Positive for fatigue. Negative for fever, chills, appetite change and unexpected weight change.  Gastrointestinal: Positive for constipation. Negative for diarrhea.  Genitourinary: Negative for difficulty urinating.       Objective:   Physical Exam  Constitutional: She appears well-developed and well-nourished.  HENT:  Head:    Mouth/Throat: Oropharyngeal exudate present.  Eyes: EOM are normal. Pupils are equal, round, and reactive to light.  Neck: Neck supple.  Cardiovascular: Normal rate, regular rhythm and normal heart sounds.   Pulmonary/Chest: Effort normal and breath sounds normal.  Abdominal: Soft.  Bowel sounds are normal. She exhibits no distension. There is no tenderness.  Musculoskeletal:       Arms: Lymphadenopathy:    She has no cervical adenopathy.          Assessment & Plan:

## 2013-12-16 NOTE — Telephone Encounter (Signed)
Verbal order per Dr. Johnnye Sima given to [place] at [facility] to continue IV antibiotics for six weeks from the start date. Myrtis Hopping

## 2013-12-16 NOTE — Progress Notes (Signed)
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  HISTORY OF PRESENT ILLNESS::[name] [name] is a 53 y.o. female who presented to the emergency department 11/14/2013 with a several week history of progressive headaches. She noted difficulty with balance and maybe some change in her handwriting.  She started having some nausea and vomiting and went to her primary care physician who empirically treated her initially for gastroenteritis and treated her with Phenergan. Her symptoms continued to progress and she came to the emergency department on 11/14/2013 where a head CT showed a right cerebellar lesion with surrounding edema. Headaches improved with Decadron. MRI further delineated a 17 mm infiltrating mass with vasogenic edema spreading into the vermis and displacing the fourth ventricle with concern for impending hydrocephalus in the right cerebellar hemisphere. Neurosurgical evaluation was requested. Chest CT showed a few small nodules without overt evidence for a primary lung malignancy. The patient proceeded to right suboccipital craniectomy for resection of the right cerebellar mass using frame was stereotactic Stealth guidance. Her pathology was sent to [facility] for second opinion and was still pending when she was re-admitted. Postoperatively, the patient did have a posterior suboccipital wound infection requiring irrigation and debridement on January 6. She has kindly been referred today to initiate discussions about potential radiation treatment options for her brain tumor.  The final pathology has been signed out, but, remains an issue.  Our  pathologists and the pathology team at [facility] agree that the pathology shows a malignant neoplasm.  But, the immunohistochemical profile does not suggest a primary tumor site either inside or outside of the brain.  PREVIOUS RADIATION THERAPY: No  PAST MEDICAL HISTORY:  has a past medical history of TMJ (dislocation of temporomandibular joint); Thyroid disease; Lung cancer; and Brain cancer.    PAST SURGICAL HISTORY: Past Surgical History  Procedure Laterality Date  . Tonsillectomy    . Appendectomy    . Suboccipital craniectomy cervical laminectomy N/A 11/19/2013    Procedure: SUBOCCIPITAL CRANIECTOMY for tumor;  Surgeon: Eustace Moore, MD;  Location: [place] [facility];  Service: Neurosurgery;  Laterality: N/A;  . Lumbar wound debridement N/A 12/05/2013    Procedure: irrigation and debridement posterior cervical wound;  Surgeon: Eustace Moore, MD;  Location: [place] [facility];  Service: Neurosurgery;  Laterality: N/A;    FAMILY HISTORY: family history includes Dementia in her mother.  SOCIAL HISTORY:  reports that she quit smoking about 3 weeks ago. She has never used smokeless tobacco. She reports that she does not drink alcohol or use illicit drugs.  ALLERGIES: Morphine and related  MEDICATIONS:  Current Outpatient Prescriptions  Medication Sig Dispense Refill  . bisacodyl (DULCOLAX) 5 MG EC tablet Take 5 mg by mouth daily as needed for moderate constipation.      Marland Kitchen dexamethasone (DECADRON) 2 MG tablet Take 1 tablet (2 mg total) by mouth 3 (three) times daily. Starting 11/23/13:  Take 1 tablet (2 mg)  four times daily for three days, then take one tablet twice daily for three days, then take one tablet daily until further instructions from md.  60 tablet  1  . dextrose 5 % SOLN 100 mL  with ceFAZolin 10 G SOLR 2 g Inject 2 g into the vein every 8 (eight) hours.      . famotidine (PEPCID) 20 MG tablet Take 1 tablet (20 mg total) by mouth 2 (two) times daily.  60 tablet  5  . fluconazole  (DIFLUCAN) 100 MG tablet Take 1 tablet (100 mg total) by mouth daily.  10 tablet  2  . levothyroxine (SYNTHROID, LEVOTHROID) 125 MCG tablet Take 125 mcg by mouth daily before breakfast.      . methocarbamol (ROBAXIN) 500 MG tablet Take 250-500 mg by mouth every 6 (six) hours as needed for muscle spasms.      . nicotine (NICODERM CQ - DOSED IN MG/24 HOURS) 14 mg/24hr patch Place 1 patch (14 mg total) onto the skin daily.  28 patch  0  . piroxicam (FELDENE) 20 MG capsule Take 20 mg by mouth daily.       No current facility-administered medications for this encounter.    REVIEW OF SYSTEMS:  A 15 point review of systems is documented in the electronic medical record. This was obtained by the nursing staff. However, I reviewed this with the patient to discuss relevant findings and make appropriate changes.  Pertinent items are noted in HPI.   PHYSICAL EXAM:  height is 5\' 7"  (1.702 m) and weight is 120 lb 4.8 oz (54.568 kg). Her oral temperature is 98 F (36.7 C). Her blood pressure is 109/70 and her pulse is 70. Her respiration is 16 and oxygen saturation is 100%.   Per Dr. Ronnald Ramp from neurosurgery  General Appearance: Alert, cooperative, no distress, appears stated age  Head: Normocephalic, without obvious abnormality, atraumatic, healing wound R subocc region with mild redness, no drainage or induration  Eyes: PERRL, conjunctiva/corneas clear, EOM's intact  Ears: Normal TM's and external ear canals, both ears  Throat: benign  Neck: Supple, symmetrical, trachea midline, no adenopathy; thyroid: No enlargement/tenderness/nodules; no carotid bruit or JVD  Back: Symmetric, no curvature, ROM normal, no CVA tenderness  Lungs: Clear to auscultation bilaterally, respirations unlabored  Heart: Regular rate and rhythm, S1 and S2 normal, no murmur, rub or gallop  Abdomen: Soft, non-tender, bowel sounds active all four quadrants, no masses, no organomegaly  Extremities: Extremities normal, atraumatic, no  cyanosis or edema  Pulses: 2+ and symmetric all extremities  Skin: Skin color, texture, turgor normal, no rashes or lesions  NEUROLOGIC:  Mental status: A&O x4, no aphasia, good attention span, Memory and fund of knowledge  Motor Exam - grossly normal, normal tone and bulk  Sensory Exam - grossly normal  Reflexes: symmetric, no pathologic reflexes, No Hoffman's, No clonus  Coordination - grossly normal  Gait - grossly normal  Balance - grossly normal  Cranial Nerves:  I: smell  Not tested   II: visual acuity  OS: na OD: na   II: visual fields  Full to confrontation   II: pupils  Equal, round, reactive to light   III,VII: ptosis  None   III,IV,VI: extraocular muscles  Full ROM   V: mastication  Normal   V: facial light touch sensation  Normal   V,VII: corneal reflex  Present   VII: facial muscle function - upper  Normal   VII: facial muscle function - lower  Normal   VIII: hearing  Not tested   IX: soft palate elevation  Normal   IX,X: gag reflex  Present   XI: trapezius strength  5/5   XI: sternocleidomastoid strength  5/5   XI:  neck flexion strength  5/5   XII: tongue strength  Normal     KPS = 90  100 - Normal; no complaints; no evidence of disease. 90   - Able to carry on normal activity; minor signs or symptoms of disease. 80   - Normal activity with effort; some signs or symptoms of disease. 62   - Cares for self; unable to carry on normal activity or to do active work. 60   - Requires occasional assistance, but is able to care for most of his personal needs. 50   - Requires considerable assistance and frequent medical care. 59   - Disabled; requires special care and assistance. 41   - Severely disabled; hospital admission is indicated although death not imminent. 69   - Very sick; hospital admission necessary; active supportive treatment necessary. 10   - Moribund; fatal processes progressing rapidly. 0     - Dead  Karnofsky DA, Abelmann [place], Craver LS and Burchenal  [facility] [phone]) The use of the nitrogen mustards in the palliative treatment of carcinoma: with particular reference to bronchogenic carcinoma Cancer 1 634-56  LABORATORY DATA:  Lab Results  Component Value Date   WBC 17.0* 12/05/2013   HGB 14.2 12/05/2013   HCT 41.2 12/05/2013   MCV 93.2 12/05/2013   PLT 262 12/05/2013   Lab Results  Component Value Date   NA 131* 12/03/2013   K 3.6* 12/03/2013   CL 89* 12/03/2013   CO2 25 12/03/2013   Lab Results  Component Value Date   ALT 13 11/15/2013   AST 13 11/15/2013   ALKPHOS 105 11/15/2013   BILITOT 0.4 11/15/2013     RADIOGRAPHY: Ct Head Wo Contrast  12/04/2013   CLINICAL DATA:  Recent craniotomy for cerebellar tumor resection presenting with nausea, vomiting and headaches.  EXAM: CT HEAD WITHOUT CONTRAST  TECHNIQUE: Contiguous axial images were obtained from the base of the skull through the vertex without intravenous contrast.  COMPARISON:  11/19/2013  FINDINGS: Sequelae of interval right suboccipital craniotomy for right cerebellar mass resection are identified. Resection cavity is present in the right cerebellar hemisphere. Cerebellar edema has nearly completely resolved, and mass effect on the 4th ventricle has resolved. A small amount of fluid/postoperative change is noted within the soft tissues overlying the craniotomy site. There is no evidence of acute large territory infarct, midline shift, intracranial hemorrhage, or extra-axial fluid collection. Ventricles and sulci are normal. Orbits are unremarkable. Mastoid air cells and visualized paranasal sinuses are clear.  IMPRESSION: Sequelae of interval right cerebellar mass resection with near complete resolution of cerebellar edema and mass effect on the 4th ventricle. No evidence of acute intracranial abnormality.   Electronically Signed   By: Logan Bores   On: 12/04/2013 07:44   Ct Head Wo Contrast  11/14/2013   CLINICAL DATA:  Dizziness.  EXAM: CT HEAD WITHOUT CONTRAST  TECHNIQUE: Contiguous axial  images were obtained from the base of the skull through the vertex without intravenous contrast.  COMPARISON:  None.  FINDINGS: An 8 mm focus of hyperdensity is present within the right cerebellum with significant surrounding edema. This creates mass effect on the 4th ventricle with midline shift and partial effacement. The basal cisterns are partially effaced as well.  The supratentorial brain is within normal limits. No acute cortical infarct or mass lesion is present. No other hemorrhage is evident. The lateral ventricles are of normal size. No significant extra-axial fluid collection is present.  IMPRESSION: 1. 8 mm hyperdensity in  within the right cerebellum concerning for hemorrhage or mass lesion with significant surrounding vasogenic edema. MRI of the brain without and with contrast is recommended for further evaluation. 2. No significant supratentorial lesions are evident. Critical Value/emergent results were called by telephone at the time of interpretation on 11/14/2013 at 4:01 PM to Dr. Betsey Holiday , who verbally acknowledged these results.   Electronically Signed   By: Lawrence Santiago M.D.   On: 11/14/2013 16:01   Ct Head W Contrast  11/19/2013   CLINICAL DATA:  53 year old female with cerebellar mass. Study for stereotactic surgical planning requested. Initial encounter.  EXAM: CT HEAD WITH CONTRAST  TECHNIQUE: Contiguous axial images were obtained from the base of the skull through the vertex with intravenous contrast.  CONTRAST:  68mL OMNIPAQUE IOHEXOL 300 MG/ML  SOLN  COMPARISON:  Brain MRI 11/14/2013.  FINDINGS: Decreased size of the round enhancing component of the right cerebellar mass since 11/14/2013. This now measures 10-11 mm diameter, previously 17 mm). See series 2, images 37 and 38. Likewise, hypodensity in the right cerebellum appears mildly decreased along with improved patency of the 4th ventricle suggesting interval regression of cerebellar edema.  No other abnormal enhancement  identified in the brain. Stable supratentorial structures. No ventriculomegaly. Major intracranial vascular structures appear to be normally enhancing. There are multiple scalp soft tissue markers in place. Visualized orbit soft tissues are within normal limits. Visualized paranasal sinuses and mastoids are clear. No acute osseous abnormality identified.  IMPRESSION: 1. Study for stereotactic surgical planning. Enhancing component of the right cerebellar lesion has mildly decreased, now 10-11 mm diameter on series 2 images 37 and 38.  2. Mild regression of cerebellar edema and improved patency of the 4th ventricle.   Electronically Signed   By: Lars Pinks M.D.   On: 11/19/2013 11:37   Ct Chest W Contrast  11/15/2013   CLINICAL DATA:  Cerebellar lesion. Question metastasis. History of tobacco use and thyroid disease.  EXAM: CT CHEST, ABDOMEN, AND PELVIS WITH CONTRAST  TECHNIQUE: Multidetector CT imaging of the chest, abdomen and pelvis was performed following the standard protocol during bolus administration of intravenous contrast.  CONTRAST:  36mL OMNIPAQUE IOHEXOL 300 MG/ML  SOLN  COMPARISON:  Brain MRI 11/14/2013  FINDINGS: CT CHEST FINDINGS  No axillary or supraclavicular lymphadenopathy. No mediastinal hilar lymphadenopathy. No pericardial fluid. No central pulmonary embolism. The thyroid gland is diminutive.  Review of the lung parenchyma demonstrates a smudgy nodule (sub solid) measuring 6 mm in the posterior aspect of the superior segment of the right lower lobe (image 22). There is a small spiculated nodule measuring 5 mm in superior segment left lower lobe (image 21). There is mild medial atelectasis the lung bases.  CT ABDOMEN AND PELVIS FINDINGS  No focal hepatic lesion. Gallbladder, pancreas, spleen, adrenal glands, and kidneys are normal. Small nonobstructing calculus in the right kidney  The stomach, small bowel, and cecum normal. The colon rectosigmoid colon normal without evidence of mass lesion  or obstruction.  The abdominal aorta is normal caliber. There is no retroperitoneal retrocrural adenopathy. No mesenteric periportal adenopathy.  No free fluid the pelvis. The uterus and bladder normal. Ovaries are normal. No aggressive osseous lesion. No pelvic lymphadenopathy.  IMPRESSION: 1. Small spiculated nodule in the superior segment of the left lower lobe in the setting of the intracranial metastasis is concerning for metastatic lesion versus less likely primary lesion. 2. Sub solid nodule in the right lower lobe is nonspecific. 3. No evidence of primary  lesion in the chest abdomen or pelvis.   Electronically Signed   By: Suzy Bouchard M.D.   On: 11/15/2013 14:29   Mr Jeri Cos ZO Contrast  11/14/2013   CLINICAL DATA:  Dizziness.  Emesis.  EXAM: MRI HEAD WITHOUT AND WITH CONTRAST  TECHNIQUE: Multiplanar, multiecho pulse sequences of the brain and surrounding structures were obtained without and with intravenous contrast.  CONTRAST:  32mL MULTIHANCE GADOBENATE DIMEGLUMINE 529 MG/ML IV SOLN  COMPARISON:  CT head earlier in the day at 1552 hr.  FINDINGS: As suspected from CT, there is a right cerebellar hemispheric mass with marked surrounding edema. The lesion contains a nonenhancing T2 hypointense component roughly 1 cm in size, best displayed on coronal T2 weighted images. The enhancing portion of the mass is somewhat larger, measuring 16 x 17 x 13 mm, appears infiltrative, with vasogenic edema spreading into the vermis and displacing the 4th ventricle approximately 2 mm right to left. The 4th ventricle is sufficiently compressed the raise concern of impending hydrocephalus should the edema worsen. No other intracranial lesions are seen.  There are no areas of acute stroke, extra-axial fluid, or hydrocephalus. Calvarium intact without osseous lesions. No craniocervical junction or upper cervical abnormality. No sinus or mastoid disease. Negative orbits. Intracranial vasculature widely patent.   Compared with the earlier CT, the appearance is not significantly worsened.  IMPRESSION: 16 x 17 x 13 mm right cerebellar infiltrative mass with marked surrounding edema. Centrally there is a component of predominantly T2 shortening suggesting acute blood or melanin. Calcification/mineralization felt less likely. Metastases which could present with this constellation of imaging findings include melanoma, hemorrhagic lung, breast, renal cell, or thyroid carcinoma, versus a primary cerebellar brain tumor such as glioma. Cerebral abscess is not favored.  Significant vasogenic edema in the setting of a possibly acutely hemorrhagic mass is concerning. The patient should be carefully observed in a neuro ICU setting for signs of increased intracranial pressure should the lesion worsen due to increasing hemorrhage or obstruction of the 4th ventricle.  Findings discussed with ordering provider at the time of dictation.   Electronically Signed   By: Rolla Flatten M.D.   On: 11/14/2013 19:25   Ct Abdomen Pelvis W Contrast  11/15/2013   CLINICAL DATA:  Cerebellar lesion. Question metastasis. History of tobacco use and thyroid disease.  EXAM: CT CHEST, ABDOMEN, AND PELVIS WITH CONTRAST  TECHNIQUE: Multidetector CT imaging of the chest, abdomen and pelvis was performed following the standard protocol during bolus administration of intravenous contrast.  CONTRAST:  50mL OMNIPAQUE IOHEXOL 300 MG/ML  SOLN  COMPARISON:  Brain MRI 11/14/2013  FINDINGS: CT CHEST FINDINGS  No axillary or supraclavicular lymphadenopathy. No mediastinal hilar lymphadenopathy. No pericardial fluid. No central pulmonary embolism. The thyroid gland is diminutive.  Review of the lung parenchyma demonstrates a smudgy nodule (sub solid) measuring 6 mm in the posterior aspect of the superior segment of the right lower lobe (image 22). There is a small spiculated nodule measuring 5 mm in superior segment left lower lobe (image 21). There is mild medial  atelectasis the lung bases.  CT ABDOMEN AND PELVIS FINDINGS  No focal hepatic lesion. Gallbladder, pancreas, spleen, adrenal glands, and kidneys are normal. Small nonobstructing calculus in the right kidney  The stomach, small bowel, and cecum normal. The colon rectosigmoid colon normal without evidence of mass lesion or obstruction.  The abdominal aorta is normal caliber. There is no retroperitoneal retrocrural adenopathy. No mesenteric periportal adenopathy.  No free fluid the pelvis.  The uterus and bladder normal. Ovaries are normal. No aggressive osseous lesion. No pelvic lymphadenopathy.  IMPRESSION: 1. Small spiculated nodule in the superior segment of the left lower lobe in the setting of the intracranial metastasis is concerning for metastatic lesion versus less likely primary lesion. 2. Sub solid nodule in the right lower lobe is nonspecific. 3. No evidence of primary  lesion in the chest abdomen or pelvis.   Electronically Signed   By: Suzy Bouchard M.D.   On: 11/15/2013 14:29   Dg Abd Acute W/chest  12/01/2013   CLINICAL DATA:  Abdominal pain and swelling. No bowel movement for 1 week. History of appendectomy.  EXAM: ACUTE ABDOMEN SERIES (ABDOMEN 2 VIEW & CHEST 1 VIEW)  COMPARISON:  Chest, Abdominal pelvic CT 11/15/2013.  FINDINGS: The heart size and mediastinal contours are normal. The lungs demonstrate mild interstitial prominence but no focal airspace disease or apparent nodularity to correspond with the recent CT. There is no pleural effusion.  There is a large amount of stool throughout the colon. There is no significant small bowel distension. The stomach is mildly distended. There is a tiny nonobstructing right renal calculus. No acute osseous findings are evident.  IMPRESSION: 1. Large amount of stool throughout the colon consistent with constipation. No evidence of small bowel obstruction. 2. No acute cardiopulmonary process.   Electronically Signed   By: Camie Patience M.D.   On: 12/01/2013  12:10      IMPRESSION: The patient is a very nice 53 year old woman status post resection of a 17 mm right cerebellar brain tumor.  Her pathology is suggestive of a metastasis.   However, without an identifiable primary or distinct immunohistochemical profile, this is difficult or impossible to confirm.  PLAN:Today, I talked to the patient and family about the findings and work-up thus far.  We discussed her pathology report and its implications on her further evaluation and management. She understands that we have not confirmed her brain tumor to be a metastatic deposit although we are suspicious of such. We discussed the natural history of disease and general treatment for solitary brain metastasis in the postoperative setting, highlighting the role or radiotherapy in the management.  We discussed the available radiation techniques, and focused on the details of logistics and delivery.  We reviewed the anticipated acute and late sequelae associated with radiation in this setting.  The patient was encouraged to ask questions that I answered to the best of my ability.   The patient would like to proceed with radiation and will be scheduled for CT simulation on Friday, January 23.   I anticipate treating the postoperative resection cavity in the posterior fossa to between 30 and 45 gray using conformal techniques and conventional fractionation.  I have requested that her tissue sample from the brain be sent for CancerTYPE ID standardized, objective molecular test based on the differential expression of 92 genes that classifies tumors by matching the gene expression pattern of a patients tumor tissue to a database of known tumor types and histological subtypes.  This test is available through [facility].  With regard to the patient's lung nodules, I suspect they're too small for characterization on PET CT and would likely consider repeat chest CT in 8 weeks.  Today, I discussed the case with Dr.  Julien Nordmann and make a referral for his consultation.  I spent 60 minutes minutes face to face with the patient and more than 50% of that time was spent in counseling and/or coordination of care.   ------------------------------------------------  Sheral Apley Tammi Klippel, M.D.

## 2013-12-16 NOTE — Assessment & Plan Note (Signed)
She has quit.

## 2013-12-16 NOTE — Assessment & Plan Note (Addendum)
Will plan for her to get 6 weeks of tx from debridement. Will call [name] [name] to change date (end ~ 01-16-14). She appears to be doing well. Will get staples out today. Some concern about small raised, tender area (retained suture?). She has f/u with neurosurgery today, greatly appreciate partnering with them. rtc 5 weeks.

## 2013-12-16 NOTE — Progress Notes (Signed)
See progress note under physician encounter. 

## 2013-12-16 NOTE — Progress Notes (Signed)
Reports nausea and vomiting x8 days took her to PCP. Given phenergan but, N/V didn't resolve. Went to ED scanned head and ended up in surgery on 11/19/2013. Admitted on 12/03/2013 with N/V. Went back to surgery for debridement due to infection of 12/05/2013. Ten staples posterior neck. Surgical site well approximated without redness, drainage, or edema. Scheduled to follow up with neurosurgeon tomorrow and have staples removed. Taking decadron 2 mg tid. Thrush noted. Diflucan prescribed. At this time denies dizziness, vision changes, ringing in the ears, and that N/V have resolved. Left arm PICC line noted. Has complete 1 of weeks on Ancef. Steady gait noted. Recall excellent. Answer appropriately to all question. Normal hand eye coordination.

## 2013-12-16 NOTE — Assessment & Plan Note (Signed)
Will rx a course of diflucan. She has lost sense of taste.

## 2013-12-16 NOTE — Assessment & Plan Note (Signed)
She has f/u with Dr Ronnald Ramp today. She is hopeful to get dx today. She has appt with rad-onc tomorrow.

## 2013-12-16 NOTE — Progress Notes (Signed)
Complete PATIENT MEASURE OF DISTRESS worksheet with a score of 3 submitted to social work.

## 2013-12-18 ENCOUNTER — Telehealth: Payer: Self-pay | Admitting: Internal Medicine

## 2013-12-18 ENCOUNTER — Encounter: Payer: Self-pay | Admitting: Radiation Oncology

## 2013-12-18 NOTE — Telephone Encounter (Signed)
C/D 12/18/13 for appt. 12/23/12

## 2013-12-18 NOTE — Telephone Encounter (Signed)
Pt scheduled to see Dr. Julien Nordmann 01/26 @ 1:3.

## 2013-12-18 NOTE — Progress Notes (Signed)
  Radiation Oncology         [phone]) [phone] ________________________________  Name: [name] [name] MRN: [id number]  Date: 12/20/2013  DOB: 03/16/1961  SIMULATION AND TREATMENT PLANNING NOTE  DIAGNOSIS:  53 yo woman s/p resection of a cerebellar malignant neoplasm.  NARRATIVE:  The patient was brought to the [facility].  Identity was confirmed.  All relevant records and images related to the planned course of therapy were reviewed.  The patient freely provided informed written consent to proceed with treatment after reviewing the details related to the planned course of therapy. The consent form was witnessed and verified by the simulation staff.  Then, the patient was set-up in a stable reproducible  supine position for radiation therapy.  CT images were obtained.  Surface markings were placed.  The CT images were loaded into the planning software.  Then the target and avoidance structures were contoured.  Treatment planning then occurred.  The radiation prescription was entered and confirmed.  Then, I designed and supervised the construction of a total of 4 medically necessary complex treatment devices in the form of a thermoplastic mask and 3 multileaf collimators shaping radiation around the target volume while shielding critical structures of the brain stem and optic pathway.  I have requested : 3D Simulation  I have requested a DVH of the following structures: Brainstem, spinal cord, optic structures, and targets.    PLAN:  The patient will receive up to 45 Gy.  ________________________________  Sheral Apley. Tammi Klippel, M.D.

## 2013-12-20 ENCOUNTER — Other Ambulatory Visit: Payer: Self-pay | Admitting: Medical Oncology

## 2013-12-20 ENCOUNTER — Ambulatory Visit
Admission: RE | Admit: 2013-12-20 | Discharge: 2013-12-20 | Disposition: A | Discharge status: Home / Self Care | Payer: 59 | Source: Ambulatory Visit | Attending: Radiation Oncology | Admitting: Radiation Oncology

## 2013-12-20 ENCOUNTER — Encounter: Payer: Self-pay | Admitting: Radiation Oncology

## 2013-12-20 DIAGNOSIS — Z9889 Other specified postprocedural states: Secondary | ICD-10-CM

## 2013-12-20 DIAGNOSIS — G9389 Other specified disorders of brain: Secondary | ICD-10-CM

## 2013-12-20 DIAGNOSIS — Z510 Encounter for antineoplastic radiation therapy: Secondary | ICD-10-CM | POA: Insufficient documentation

## 2013-12-20 DIAGNOSIS — D496 Neoplasm of unspecified behavior of brain: Secondary | ICD-10-CM

## 2013-12-20 DIAGNOSIS — C716 Malignant neoplasm of cerebellum: Secondary | ICD-10-CM | POA: Insufficient documentation

## 2013-12-22 ENCOUNTER — Other Ambulatory Visit: Payer: Self-pay | Admitting: Internal Medicine

## 2013-12-22 DIAGNOSIS — R911 Solitary pulmonary nodule: Secondary | ICD-10-CM

## 2013-12-23 ENCOUNTER — Telehealth: Payer: Self-pay | Admitting: Internal Medicine

## 2013-12-23 ENCOUNTER — Encounter: Payer: Self-pay | Admitting: Internal Medicine

## 2013-12-23 ENCOUNTER — Ambulatory Visit: Payer: 59

## 2013-12-23 ENCOUNTER — Other Ambulatory Visit (HOSPITAL_BASED_OUTPATIENT_CLINIC_OR_DEPARTMENT_OTHER): Payer: 59

## 2013-12-23 ENCOUNTER — Encounter (HOSPITAL_COMMUNITY): Payer: Self-pay

## 2013-12-23 ENCOUNTER — Ambulatory Visit (HOSPITAL_BASED_OUTPATIENT_CLINIC_OR_DEPARTMENT_OTHER): Payer: 59 | Admitting: Internal Medicine

## 2013-12-23 VITALS — BP 113/73 | HR 67 | Temp 98.0°F | Resp 18 | Ht 67.0 in | Wt 127.7 lb

## 2013-12-23 DIAGNOSIS — Z9889 Other specified postprocedural states: Secondary | ICD-10-CM

## 2013-12-23 DIAGNOSIS — C7949 Secondary malignant neoplasm of other parts of nervous system: Secondary | ICD-10-CM

## 2013-12-23 DIAGNOSIS — C801 Malignant (primary) neoplasm, unspecified: Secondary | ICD-10-CM

## 2013-12-23 DIAGNOSIS — R911 Solitary pulmonary nodule: Secondary | ICD-10-CM

## 2013-12-23 DIAGNOSIS — C799 Secondary malignant neoplasm of unspecified site: Secondary | ICD-10-CM

## 2013-12-23 DIAGNOSIS — C7931 Secondary malignant neoplasm of brain: Secondary | ICD-10-CM

## 2013-12-23 LAB — COMPREHENSIVE METABOLIC PANEL (CC13)
ALT: 9 U/L (ref 0–55)
AST: 12 U/L (ref 5–34)
Albumin: 2.8 g/dL — ABNORMAL LOW (ref 3.5–5.0)
Alkaline Phosphatase: 78 U/L (ref 40–150)
Anion Gap: 8 mEq/L (ref 3–11)
BUN: 19.3 mg/dL (ref 7.0–26.0)
CHLORIDE: 103 meq/L (ref 98–109)
CO2: 25 mEq/L (ref 22–29)
CREATININE: 0.6 mg/dL (ref 0.6–1.1)
Calcium: 8.4 mg/dL (ref 8.4–10.4)
Glucose: 123 mg/dl (ref 70–140)
Potassium: 3.9 mEq/L (ref 3.5–5.1)
Sodium: 136 mEq/L (ref 136–145)
Total Bilirubin: 0.26 mg/dL (ref 0.20–1.20)
Total Protein: 5.2 g/dL — ABNORMAL LOW (ref 6.4–8.3)

## 2013-12-23 LAB — CBC WITH DIFFERENTIAL/PLATELET
BASO%: 0.1 % (ref 0.0–2.0)
Basophils Absolute: 0 10*3/uL (ref 0.0–0.1)
EOS%: 0.1 % (ref 0.0–7.0)
Eosinophils Absolute: 0 10*3/uL (ref 0.0–0.5)
HCT: 35.3 % (ref 34.8–46.6)
HGB: 11.8 g/dL (ref 11.6–15.9)
LYMPH#: 0.7 10*3/uL — AB (ref 0.9–3.3)
LYMPH%: 4.8 % — ABNORMAL LOW (ref 14.0–49.7)
MCH: 31.5 pg (ref 25.1–34.0)
MCHC: 33.4 g/dL (ref 31.5–36.0)
MCV: 94.1 fL (ref 79.5–101.0)
MONO#: 1 10*3/uL — AB (ref 0.1–0.9)
MONO%: 7.1 % (ref 0.0–14.0)
NEUT#: 12 10*3/uL — ABNORMAL HIGH (ref 1.5–6.5)
NEUT%: 87.9 % — ABNORMAL HIGH (ref 38.4–76.8)
NRBC: 0 % (ref 0–0)
Platelets: 219 10*3/uL (ref 145–400)
RBC: 3.75 10*6/uL (ref 3.70–5.45)
RDW: 16.1 % — AB (ref 11.2–14.5)
WBC: 13.6 10*3/uL — AB (ref 3.9–10.3)

## 2013-12-23 NOTE — Progress Notes (Signed)
Checked in new pt with no financial concerns. °

## 2013-12-23 NOTE — Progress Notes (Signed)
[place] [phone]   [phone]  CONSULT NOTE  REFERRING PHYSICIAN:Dr. Tyler Pita  REASON FOR CONSULTATION:  53 years old white female with metastatic brain tumor of unknown nature.  HPI [name] [name] is a 53 y.o. female with past medical history significant for hypothyroidism and TMJ as well as long history of smoking but quit on 11/18/2013. The patient mentions that on 11/14/2013 she presented to the emergency Department at [facility] complaining of headache dizziness as well as nausea and vomiting. CT scan of the head at that time showed 8 mm hyperdensity within the right cerebellum concerning for hemorrhage or mass lesion with significant surrounding vasogenic edema. This was followed by MRI of the brain on 11/14/2013 and it showed 1.6 x 1.7 x 1.3 CM right cerebellar infiltrative mass with marked surrounding edema. Centrally there was a component of predominantly T2 shortening suggesting acute blood or melanin. Metastatic disease was a consideration but a primary cerebellar brain tumor such as glioma was also a consideration. On 11/15/2013 the patient underwent CT scan of the chest, abdomen and pelvis. It showed small spiculated nodule in the superior segment of the left lower lobe in the setting of the intracranial metastasis is concerning for metastatic lesion versus less likely primary lesion. There was also stopped solid nodule in the right lower lobe that was nonspecific and no evidence of a primary lesion in the chest, abdomen or pelvis. The patient was seen by neurosurgery Dr. Sherley Bounds and on 11/19/2014 she underwent Right suboccipital craniectomy for removal of right cerebellar mass utilizing frameless stereotactic stealth guidance.  The final pathology (Accession: [id number]) showed malignant neoplasm.  This case is sent to Dr Rudi Heap at [facility] and the comment portion of his report is quoted  below: "The well-circumscribed, centrally necrotic lesion has mitoses and a brisk MIB-1 index. The lesion is GFAP negative. It does not resemble hemangioblastoma, and is inhibin negative. Perivascular sparing of tumor is consistent with a metastasis but its cells discohesive nature and nuclear features raise the issue of a hematopoietic lesion but stains for such a tumor (CD34, CD45, CD20, CD3) were negative. CD68 and CD163 staining reveal many phagocytes, but tumor cells were negative. The staff of hematopathology reviewed the case and did not feel it was a hematopoietic lesion. Immunostaining for carcinoma (CK7, CK20, AE1/AE3, TTF-1) was negative. Melan-A stain is also negative. The nature of the lesion thus remains unclear. The patient was seen by Dr. Tammi Klippel and she is scheduled for stereotactic radio surgery to the resection cavity on 12/26/2013. When seen today the patient is feeling fine with no specific complaints she is currently on Decadron 2 mg by mouth twice a day and this will be tapered slowly by Dr. Tammi Klippel. The patient is currently undergoing treatment for staph infection with IV Ancef via a PICC line. She denied having any significant chest pain, shortness breath, cough or hemoptysis. The patient denied having any significant weight loss or night sweats. She has no nausea or vomiting, no fever or chills.  Family history significant for a father who died in an accident at age 101, mother still alive at age 58 with Alzheimer. No family history of malignancy. The patient is married for 37 years  And has no children. She works for [employer]. She has a history of smoking more than one pack per day for around 34 years and she quit 11/18/2013. She has no history of alcohol or  drug abuse.   HPI  Past Medical History  Diagnosis Date  . TMJ (dislocation of temporomandibular joint)   . Thyroid disease   . Lung cancer     awaiting pathology  . Brain cancer     awaiting  pathology; assuming this is a lung primary with brain mets    Past Surgical History  Procedure Laterality Date  . Tonsillectomy    . Appendectomy    . Suboccipital craniectomy cervical laminectomy N/A 11/19/2013    Procedure: SUBOCCIPITAL CRANIECTOMY for tumor;  Surgeon: Eustace Moore, MD;  Location: [place] [facility];  Service: Neurosurgery;  Laterality: N/A;  . Lumbar wound debridement N/A 12/05/2013    Procedure: irrigation and debridement posterior cervical wound;  Surgeon: Eustace Moore, MD;  Location: [place] [facility];  Service: Neurosurgery;  Laterality: N/A;    Family History  Problem Relation Age of Onset  . Dementia Mother     Social History History  Substance Use Topics  . Smoking status: Former Smoker    Quit date: 11/19/2013  . Smokeless tobacco: Never Used  . Alcohol Use: No    Allergies  Allergen Reactions  . Morphine And Related Nausea Only    Current Outpatient Prescriptions  Medication Sig Dispense Refill  . bisacodyl (DULCOLAX) 5 MG EC tablet Take 5 mg by mouth daily as needed for moderate constipation.      Marland Kitchen dexamethasone (DECADRON) 2 MG tablet Take 1 tablet (2 mg total) by mouth 3 (three) times daily. Starting 11/23/13:  Take 1 tablet (2 mg)  four times daily for three days, then take one tablet twice daily for three days, then take one tablet daily until further instructions from md.  60 tablet  1  . dextrose 5 % SOLN 100 mL with ceFAZolin 10 G SOLR 2 g Inject 2 g into the vein every 8 (eight) hours.      . famotidine (PEPCID) 20 MG tablet Take 1 tablet (20 mg total) by mouth 2 (two) times daily.  60 tablet  5  . fluconazole (DIFLUCAN) 100 MG tablet Take 1 tablet (100 mg total) by mouth daily.  10 tablet  2  . levothyroxine (SYNTHROID, LEVOTHROID) 125 MCG tablet Take 125 mcg by mouth daily before breakfast.      . methocarbamol (ROBAXIN) 500 MG tablet Take 250-500 mg by mouth every 6 (six) hours as needed for muscle spasms.      . nicotine (NICODERM CQ - DOSED IN  MG/24 HOURS) 14 mg/24hr patch Place 1 patch (14 mg total) onto the skin daily.  28 patch  0  . piroxicam (FELDENE) 20 MG capsule Take 20 mg by mouth daily.       No current facility-administered medications for this visit.    Review of Systems  Constitutional: negative Eyes: negative Ears, nose, mouth, throat, and face: negative Respiratory: negative Cardiovascular: negative Gastrointestinal: negative Genitourinary:negative Integument/breast: negative Hematologic/lymphatic: negative Musculoskeletal:negative Neurological: negative Behavioral/Psych: negative Endocrine: negative Allergic/Immunologic: negative  Physical Exam  QIO:NGEXB, healthy, no distress, well nourished, well developed and anxious SKIN: skin color, texture, turgor are normal, no rashes or significant lesions HEAD: Normocephalic, No masses, lesions, tenderness or abnormalities EYES: normal, PERRLA EARS: External ears normal, Canals clear OROPHARYNX:no exudate, no erythema and lips, buccal mucosa, and tongue normal  NECK: supple, no adenopathy, no JVD LYMPH:  no palpable lymphadenopathy, no hepatosplenomegaly BREAST:not examined LUNGS: clear to auscultation , and palpation HEART: regular rate & rhythm, no murmurs and no gallops ABDOMEN:abdomen soft, non-tender, normal  bowel sounds and no masses or organomegaly BACK: Back symmetric, no curvature., No CVA tenderness EXTREMITIES:no joint deformities, effusion, or inflammation, no edema, no skin discoloration  NEURO: alert & oriented x 3 with fluent speech, no focal motor/sensory deficits  PERFORMANCE STATUS: ECOG 1  LABORATORY DATA: Lab Results  Component Value Date   WBC 13.6* 12/23/2013   HGB 11.8 12/23/2013   HCT 35.3 12/23/2013   MCV 94.1 12/23/2013   PLT 219 12/23/2013      Chemistry      Component Value Date/Time   NA 136 12/23/2013 1356   NA 131* 12/03/2013 2018   K 3.9 12/23/2013 1356   K 3.6* 12/03/2013 2018   CL 89* 12/03/2013 2018   CO2 25  12/23/2013 1356   CO2 25 12/03/2013 2018   BUN 19.3 12/23/2013 1356   BUN 23 12/03/2013 2018   CREATININE 0.6 12/23/2013 1356   CREATININE 0.53 12/03/2013 2018      Component Value Date/Time   CALCIUM 8.4 12/23/2013 1356   CALCIUM 9.7 12/03/2013 2018   ALKPHOS 78 12/23/2013 1356   ALKPHOS 105 11/15/2013 0700   AST 12 12/23/2013 1356   AST 13 11/15/2013 0700   ALT 9 12/23/2013 1356   ALT 13 11/15/2013 0700   BILITOT 0.26 12/23/2013 1356   BILITOT 0.4 11/15/2013 0700       RADIOGRAPHIC STUDIES: Ct Head Wo Contrast  12/04/2013   CLINICAL DATA:  Recent craniotomy for cerebellar tumor resection presenting with nausea, vomiting and headaches.  EXAM: CT HEAD WITHOUT CONTRAST  TECHNIQUE: Contiguous axial images were obtained from the base of the skull through the vertex without intravenous contrast.  COMPARISON:  11/19/2013  FINDINGS: Sequelae of interval right suboccipital craniotomy for right cerebellar mass resection are identified. Resection cavity is present in the right cerebellar hemisphere. Cerebellar edema has nearly completely resolved, and mass effect on the 4th ventricle has resolved. A small amount of fluid/postoperative change is noted within the soft tissues overlying the craniotomy site. There is no evidence of acute large territory infarct, midline shift, intracranial hemorrhage, or extra-axial fluid collection. Ventricles and sulci are normal. Orbits are unremarkable. Mastoid air cells and visualized paranasal sinuses are clear.  IMPRESSION: Sequelae of interval right cerebellar mass resection with near complete resolution of cerebellar edema and mass effect on the 4th ventricle. No evidence of acute intracranial abnormality.   Electronically Signed   By: Logan Bores   On: 12/04/2013 07:44   Dg Abd Acute W/chest  12/01/2013   CLINICAL DATA:  Abdominal pain and swelling. No bowel movement for 1 week. History of appendectomy.  EXAM: ACUTE ABDOMEN SERIES (ABDOMEN 2 VIEW & CHEST 1 VIEW)  COMPARISON:   Chest, Abdominal pelvic CT 11/15/2013.  FINDINGS: The heart size and mediastinal contours are normal. The lungs demonstrate mild interstitial prominence but no focal airspace disease or apparent nodularity to correspond with the recent CT. There is no pleural effusion.  There is a large amount of stool throughout the colon. There is no significant small bowel distension. The stomach is mildly distended. There is a tiny nonobstructing right renal calculus. No acute osseous findings are evident.  IMPRESSION: 1. Large amount of stool throughout the colon consistent with constipation. No evidence of small bowel obstruction. 2. No acute cardiopulmonary process.   Electronically Signed   By: Camie Patience M.D.   On: 12/01/2013 12:10    ASSESSMENT: This is a very pleasant 53 years old white female with metastatic brain tumor of unclear  nature at this point. The patient has a small spiculated nodule in the left lung.  PLAN: I had a lengthy discussion with the patient and her husband today about her current condition. The nature of her tumor is unclear based on the recent pathology report. I recommended for the patient to have a PET scan performed to see if there is any other suspicious lesion that can guide [place] to the nature of her metastatic brain tumor. I will also send her tissue block to [facility] to be tested for molecular biomarkers. One of the other option is to ask interventional radiology if they would be able to biopsy the spiculated nodule in the left lung. I would see the patient back for follow up visit in one month for reevaluation after the PET scan and the biomarkers testing are performed. She was advised to call immediately if she has any concerning symptoms in the interval.  The patient voices understanding of current disease status and treatment options and is in agreement with the current care plan.  All questions were answered. The patient knows to call the clinic with any problems,  questions or concerns. We can certainly see the patient much sooner if necessary.  Thank you so much for allowing me to participate in the care of [name]. I will continue to follow up the patient with you and assist in her care.  I spent 40 minutes counseling the patient face to face. The total time spent in the appointment was 60 minutes.  Disclaimer: This note was dictated with voice recognition software. Similar sounding words can inadvertently be transcribed and may not be corrected upon review.   Amron Guerrette K. 12/23/2013, 3:16 PM

## 2013-12-23 NOTE — Telephone Encounter (Signed)
gv and printed appt sched and avs for pt for Feb..Marland Kitchen

## 2013-12-23 NOTE — Patient Instructions (Signed)
Follow up visit in one month after the PET scan and biomarkers testing for your tumor

## 2013-12-24 ENCOUNTER — Other Ambulatory Visit (HOSPITAL_COMMUNITY)
Admission: RE | Admit: 2013-12-24 | Discharge: 2013-12-24 | Disposition: A | Discharge status: Home / Self Care | Payer: 59 | Source: Ambulatory Visit | Attending: Internal Medicine | Admitting: Internal Medicine

## 2013-12-24 DIAGNOSIS — D496 Neoplasm of unspecified behavior of brain: Secondary | ICD-10-CM | POA: Insufficient documentation

## 2013-12-26 ENCOUNTER — Ambulatory Visit
Admission: RE | Admit: 2013-12-26 | Discharge: 2013-12-26 | Disposition: A | Discharge status: Home / Self Care | Payer: 59 | Source: Ambulatory Visit | Attending: Radiation Oncology | Admitting: Radiation Oncology

## 2013-12-26 DIAGNOSIS — D496 Neoplasm of unspecified behavior of brain: Secondary | ICD-10-CM

## 2013-12-27 ENCOUNTER — Encounter: Payer: Self-pay | Admitting: Radiation Oncology

## 2013-12-27 ENCOUNTER — Ambulatory Visit
Admission: RE | Admit: 2013-12-27 | Discharge: 2013-12-27 | Disposition: A | Discharge status: Home / Self Care | Payer: 59 | Source: Ambulatory Visit | Attending: Radiation Oncology | Admitting: Radiation Oncology

## 2013-12-27 VITALS — BP 113/64 | HR 78 | Temp 97.9°F | Resp 20 | Wt 135.6 lb

## 2013-12-27 DIAGNOSIS — D496 Neoplasm of unspecified behavior of brain: Secondary | ICD-10-CM

## 2013-12-27 NOTE — Progress Notes (Signed)
  Radiation Oncology         [phone]) [phone] ________________________________  Name: [name] [name] MRN: [id number]  Date: 12/26/2013  DOB: 06/05/1961  Simulation Verification Note  Status: outpatient  NARRATIVE: The patient was brought to the treatment unit and placed in the planned treatment position. The clinical setup was verified. Then port films were obtained and uploaded to the radiation oncology medical record software.  The treatment beams were carefully compared against the planned radiation fields. The position location and shape of the radiation fields was reviewed. They targeted volume of tissue appears to be appropriately covered by the radiation beams. Organs at risk appear to be excluded as planned.  Based on my personal review, I approved the simulation verification. The patient's treatment will proceed as planned.  ------------------------------------------------  Sheral Apley Tammi Klippel, M.D.

## 2013-12-27 NOTE — Progress Notes (Addendum)
Pt states she had stitch removed from scalp today by her husband. She states this area is tender/sore. Pt reports weakness of legs. She denies nausea, HA, blurred vision, dizziness, unsteadiness. Pt taking Decadron 2 mg twice daily. She completed Diflucan today. Post sim ed completed w/pt. Gave pt '"Radiation and You" booklet w/all pertinent information marked and discussed, re: fatigue, hair loss/care, nausea, skin irritation/care, nutrition, pain. All questions answered. Pt verbalized understanding.

## 2013-12-29 ENCOUNTER — Encounter: Payer: Self-pay | Admitting: Radiation Oncology

## 2013-12-29 NOTE — Progress Notes (Signed)
  Radiation Oncology         [phone]) [phone] ________________________________  Name: [name] [name] MRN: [id number]  Date: 12/27/2013  DOB: Mar 10, 1961  Weekly Radiation Therapy Management  Current Dose: 5 Gy     Planned Dose:  45 Gy  Narrative . . . . . . . . The patient presents for routine under treatment assessment.                                   The patient is without complaint.                                 Set-up films were reviewed.                                 The chart was checked. Physical Findings. . .  weight is 135 lb 9.6 oz (61.508 kg). Her temperature is 97.9 F (36.6 C). Her blood pressure is 113/64 and her pulse is 78. Her respiration is 20. . Weight essentially stable.  No significant changes. Impression . . . . . . . The patient is tolerating radiation. Plan . . . . . . . . . . . . Continue treatment as planned.  ________________________________  Sheral Apley. Tammi Klippel, M.D.

## 2013-12-30 ENCOUNTER — Ambulatory Visit
Admission: RE | Admit: 2013-12-30 | Discharge: 2013-12-30 | Disposition: A | Discharge status: Home / Self Care | Payer: 59 | Source: Ambulatory Visit | Attending: Radiation Oncology | Admitting: Radiation Oncology

## 2013-12-30 ENCOUNTER — Encounter: Payer: Self-pay | Admitting: *Deleted

## 2013-12-30 NOTE — Progress Notes (Signed)
Received fax from [facility] stating results completed by 01/12/14.  Placed on Dr. Worthy Flank desk

## 2013-12-31 ENCOUNTER — Ambulatory Visit
Admission: RE | Admit: 2013-12-31 | Discharge: 2013-12-31 | Disposition: A | Discharge status: Home / Self Care | Payer: 59 | Source: Ambulatory Visit | Attending: Radiation Oncology | Admitting: Radiation Oncology

## 2014-01-01 ENCOUNTER — Ambulatory Visit
Admission: RE | Admit: 2014-01-01 | Discharge: 2014-01-01 | Disposition: A | Discharge status: Home / Self Care | Payer: 59 | Source: Ambulatory Visit | Attending: Radiation Oncology | Admitting: Radiation Oncology

## 2014-01-02 ENCOUNTER — Encounter (HOSPITAL_COMMUNITY)
Admission: RE | Admit: 2014-01-02 | Discharge: 2014-01-02 | Disposition: A | Discharge status: Home / Self Care | Payer: 59 | Source: Ambulatory Visit | Attending: Internal Medicine | Admitting: Internal Medicine

## 2014-01-02 ENCOUNTER — Ambulatory Visit
Admission: RE | Admit: 2014-01-02 | Discharge: 2014-01-02 | Disposition: A | Discharge status: Home / Self Care | Payer: 59 | Source: Ambulatory Visit | Attending: Radiation Oncology | Admitting: Radiation Oncology

## 2014-01-02 DIAGNOSIS — C801 Malignant (primary) neoplasm, unspecified: Secondary | ICD-10-CM | POA: Insufficient documentation

## 2014-01-02 DIAGNOSIS — C799 Secondary malignant neoplasm of unspecified site: Secondary | ICD-10-CM

## 2014-01-02 DIAGNOSIS — R911 Solitary pulmonary nodule: Secondary | ICD-10-CM

## 2014-01-02 LAB — GLUCOSE, CAPILLARY: GLUCOSE-CAPILLARY: 77 mg/dL (ref 70–99)

## 2014-01-02 MED ORDER — FLUDEOXYGLUCOSE F - 18 (FDG) INJECTION: 7.6000 | Freq: Once | INTRAVENOUS | Status: DC | PRN

## 2014-01-02 MED ORDER — TECHNETIUM TC 99M SULFUR COLLOID FILTERED: 7.6000 | Freq: Once | INTRAVENOUS | Status: DC | PRN

## 2014-01-03 ENCOUNTER — Ambulatory Visit
Admission: RE | Admit: 2014-01-03 | Discharge: 2014-01-03 | Disposition: A | Discharge status: Home / Self Care | Payer: 59 | Source: Ambulatory Visit | Attending: Radiation Oncology | Admitting: Radiation Oncology

## 2014-01-03 ENCOUNTER — Encounter: Payer: Self-pay | Admitting: Radiation Oncology

## 2014-01-03 VITALS — BP 108/69 | HR 69 | Resp 16 | Wt 130.0 lb

## 2014-01-03 DIAGNOSIS — D496 Neoplasm of unspecified behavior of brain: Secondary | ICD-10-CM

## 2014-01-03 NOTE — Progress Notes (Signed)
Reports a persistent occipital daily mild headache just above her scar. Reports her taste buds have returned. Reports that she has been taking decadron 2 mg once per day and is now sleeping better. Requesting robaxin refill. Reports leg weakness continues. Reports she has to push up from a sitting position. [name] [name] has resolved.

## 2014-01-03 NOTE — Progress Notes (Signed)
  Radiation Oncology         [phone]) [phone] ________________________________  Name: [name] [name] MRN: [id number]  Date: 01/03/2014  DOB: [birth year]  Weekly Radiation Therapy Management  Current Dose: 17.5 Gy     Planned Dose:  45 Gy  Narrative . . . . . . . . The patient presents for routine under treatment assessment.                                   The patient is without complaint.                                 Set-up films were reviewed.                                 The chart was checked. Physical Findings. . .  weight is 130 lb (58.968 kg). Her blood pressure is 108/69 and her pulse is 69. Her respiration is 16. . Weight essentially stable.  No significant changes. Impression . . . . . . . The patient is tolerating radiation. Plan . . . . . . . . . . . . Continue treatment as planned.  ________________________________  Sheral Apley. Tammi Klippel, M.D.

## 2014-01-06 ENCOUNTER — Ambulatory Visit
Admission: RE | Admit: 2014-01-06 | Discharge: 2014-01-06 | Disposition: A | Discharge status: Home / Self Care | Payer: 59 | Source: Ambulatory Visit | Attending: Radiation Oncology | Admitting: Radiation Oncology

## 2014-01-07 ENCOUNTER — Ambulatory Visit
Admission: RE | Admit: 2014-01-07 | Discharge: 2014-01-07 | Disposition: A | Discharge status: Home / Self Care | Payer: 59 | Source: Ambulatory Visit | Attending: Radiation Oncology | Admitting: Radiation Oncology

## 2014-01-08 ENCOUNTER — Encounter: Payer: Self-pay | Admitting: *Deleted

## 2014-01-08 ENCOUNTER — Ambulatory Visit
Admission: RE | Admit: 2014-01-08 | Discharge: 2014-01-08 | Disposition: A | Discharge status: Home / Self Care | Payer: 59 | Source: Ambulatory Visit | Attending: Radiation Oncology | Admitting: Radiation Oncology

## 2014-01-08 ENCOUNTER — Encounter: Payer: Self-pay | Admitting: Radiation Oncology

## 2014-01-08 ENCOUNTER — Encounter (HOSPITAL_COMMUNITY): Payer: Self-pay

## 2014-01-08 VITALS — BP 120/62 | HR 84 | Temp 98.5°F | Wt 129.6 lb

## 2014-01-08 DIAGNOSIS — D496 Neoplasm of unspecified behavior of brain: Secondary | ICD-10-CM

## 2014-01-08 NOTE — Progress Notes (Signed)
Patient for weekly assessment of radiation to brain.completed 10 of 12.Has an occasional headache.Currently on dexamethasone 2 mg every other day.No nausea.

## 2014-01-08 NOTE — Progress Notes (Signed)
Obtained [facility] test results, placed on Dr. Worthy Flank desk

## 2014-01-08 NOTE — Progress Notes (Signed)
  Radiation Oncology         [phone] ________________________________  Name: [name] [name] MRN: [id number]  Date: 01/08/2014  DOB: 05-Dec-1960  Weekly Radiation Therapy Management  Current Dose: 25 Gy     Planned Dose:  45 Gy  Narrative . . . . . . . . The patient presents for routine under treatment assessment.                                   The patient is without complaint.                                 Set-up films were reviewed.                                 The chart was checked. Physical Findings. . .  weight is 129 lb 9.6 oz (58.786 kg). Her temperature is 98.5 F (36.9 C). Her blood pressure is 120/62 and her pulse is 84. Her oxygen saturation is 99%. . Weight essentially stable.  No significant changes. Impression . . . . . . . The patient is tolerating radiation. Plan . . . . . . . . . . . . Continue treatment as planned.  ________________________________  Sheral Apley. Tammi Klippel, M.D.

## 2014-01-08 NOTE — Progress Notes (Signed)
Fax received from [place] for short tem disability information.  I faxed requested information to [phone] and notified the patient that I had done so.

## 2014-01-09 ENCOUNTER — Ambulatory Visit
Admission: RE | Admit: 2014-01-09 | Discharge: 2014-01-09 | Disposition: A | Discharge status: Home / Self Care | Payer: 59 | Source: Ambulatory Visit | Attending: Radiation Oncology | Admitting: Radiation Oncology

## 2014-01-10 ENCOUNTER — Ambulatory Visit
Admission: RE | Admit: 2014-01-10 | Discharge: 2014-01-10 | Disposition: A | Discharge status: Home / Self Care | Payer: 59 | Source: Ambulatory Visit | Attending: Radiation Oncology | Admitting: Radiation Oncology

## 2014-01-13 ENCOUNTER — Ambulatory Visit
Admission: RE | Admit: 2014-01-13 | Discharge: 2014-01-13 | Disposition: A | Discharge status: Home / Self Care | Payer: 59 | Source: Ambulatory Visit | Attending: Radiation Oncology | Admitting: Radiation Oncology

## 2014-01-14 ENCOUNTER — Ambulatory Visit
Admission: RE | Admit: 2014-01-14 | Discharge: 2014-01-14 | Disposition: A | Discharge status: Home / Self Care | Payer: 59 | Source: Ambulatory Visit | Attending: Radiation Oncology | Admitting: Radiation Oncology

## 2014-01-15 ENCOUNTER — Ambulatory Visit
Admission: RE | Admit: 2014-01-15 | Discharge: 2014-01-15 | Disposition: A | Discharge status: Home / Self Care | Payer: 59 | Source: Ambulatory Visit | Attending: Radiation Oncology | Admitting: Radiation Oncology

## 2014-01-15 ENCOUNTER — Encounter: Payer: Self-pay | Admitting: Radiation Oncology

## 2014-01-15 VITALS — BP 110/64 | HR 70 | Resp 16 | Wt 133.1 lb

## 2014-01-15 DIAGNOSIS — C7931 Secondary malignant neoplasm of brain: Secondary | ICD-10-CM

## 2014-01-15 DIAGNOSIS — C7949 Secondary malignant neoplasm of other parts of nervous system: Principal | ICD-10-CM

## 2014-01-15 NOTE — Progress Notes (Signed)
  Radiation Oncology         (336) [phone] ________________________________  Name: [name] [name] MRN: [id number]  Date: 01/15/2014  DOB: [birth year]  Weekly Radiation Therapy Management  Current Dose: 37.5 Gy     Planned Dose:  40 Gy  Narrative . . . . . . . . The patient presents for routine under treatment assessment.                                   The patient is without complaint.  2 mg dex QOD                                 Set-up films were reviewed.                                 The chart was checked. Physical Findings. . .  weight is 133 lb 1.6 oz (60.374 kg). Her blood pressure is 110/64 and her pulse is 70. Her respiration is 16. . Weight essentially stable.  No significant changes. Impression . . . . . . . The patient is tolerating radiation. Plan . . . . . . . . . . . . Continue treatment as planned.  ________________________________  Sheral Apley. Tammi Klippel, M.D.

## 2014-01-15 NOTE — Progress Notes (Signed)
Reports that she has tapered down to Decadron 1 mg every other day. Reports that last night she awoke with a horrible headache that caused her to be sound and light sensitive. She reports that she cried because the headache was so horrible. Reports taking old tylenol 3 script and returning to bed. Reports fatigue. Patient completes treatment tomorrow. Patient still questions result of pathology. One month follow up appointment card given.

## 2014-01-16 ENCOUNTER — Telehealth: Payer: Self-pay | Admitting: *Deleted

## 2014-01-16 ENCOUNTER — Encounter: Payer: Self-pay | Admitting: Radiation Oncology

## 2014-01-16 ENCOUNTER — Ambulatory Visit
Admission: RE | Admit: 2014-01-16 | Discharge: 2014-01-16 | Disposition: A | Discharge status: Home / Self Care | Payer: 59 | Source: Ambulatory Visit | Attending: Radiation Oncology | Admitting: Radiation Oncology

## 2014-01-16 NOTE — Telephone Encounter (Signed)
Patient called to see when her end date is for IV antibiotics, she is anxious to have her PICC out.  Per chart review, Dr Johnnye Sima had the patient completing therapy 2/19.  Routine labs were drawn 2/18.  Patient would like RN to call [facility] to d/c antibiotics and pull PICC.  RN called, spoke with Ulis Rias to give the order.  Per nursing, they cautioned patient to keep the PICC, just in case she is to start chemotherapy soon.  Patient stated she has decided to get a 2nd opinion from [place] before starting chemo; does not want the risk of infection of an unused PICC.   Pt to follow up with Dr. Johnnye Sima 2/23. Landis Gandy, RN

## 2014-01-20 ENCOUNTER — Other Ambulatory Visit (HOSPITAL_BASED_OUTPATIENT_CLINIC_OR_DEPARTMENT_OTHER): Payer: 59

## 2014-01-20 ENCOUNTER — Ambulatory Visit (INDEPENDENT_AMBULATORY_CARE_PROVIDER_SITE_OTHER): Payer: 59 | Admitting: Infectious Diseases

## 2014-01-20 ENCOUNTER — Telehealth: Payer: Self-pay | Admitting: Internal Medicine

## 2014-01-20 ENCOUNTER — Encounter: Payer: Self-pay | Admitting: Infectious Diseases

## 2014-01-20 ENCOUNTER — Ambulatory Visit (HOSPITAL_BASED_OUTPATIENT_CLINIC_OR_DEPARTMENT_OTHER): Payer: 59 | Admitting: Internal Medicine

## 2014-01-20 ENCOUNTER — Encounter: Payer: Self-pay | Admitting: Internal Medicine

## 2014-01-20 VITALS — BP 90/64 | HR 81 | Temp 98.7°F | Ht 67.0 in | Wt 139.0 lb

## 2014-01-20 VITALS — BP 106/69 | HR 78 | Temp 97.7°F | Resp 18 | Ht 67.0 in | Wt 130.1 lb

## 2014-01-20 DIAGNOSIS — C7800 Secondary malignant neoplasm of unspecified lung: Secondary | ICD-10-CM

## 2014-01-20 DIAGNOSIS — C799 Secondary malignant neoplasm of unspecified site: Secondary | ICD-10-CM

## 2014-01-20 DIAGNOSIS — C801 Malignant (primary) neoplasm, unspecified: Secondary | ICD-10-CM

## 2014-01-20 DIAGNOSIS — C7949 Secondary malignant neoplasm of other parts of nervous system: Principal | ICD-10-CM

## 2014-01-20 DIAGNOSIS — C7931 Secondary malignant neoplasm of brain: Secondary | ICD-10-CM

## 2014-01-20 DIAGNOSIS — T8149XA Infection following a procedure, other surgical site, initial encounter: Secondary | ICD-10-CM

## 2014-01-20 DIAGNOSIS — T8140XA Infection following a procedure, unspecified, initial encounter: Secondary | ICD-10-CM

## 2014-01-20 DIAGNOSIS — R911 Solitary pulmonary nodule: Secondary | ICD-10-CM

## 2014-01-20 DIAGNOSIS — R51 Headache: Secondary | ICD-10-CM

## 2014-01-20 LAB — COMPREHENSIVE METABOLIC PANEL (CC13)
ALK PHOS: 96 U/L (ref 40–150)
ALT: 20 U/L (ref 0–55)
AST: 16 U/L (ref 5–34)
Albumin: 3.6 g/dL (ref 3.5–5.0)
Anion Gap: 10 mEq/L (ref 3–11)
BILIRUBIN TOTAL: 0.41 mg/dL (ref 0.20–1.20)
BUN: 12.9 mg/dL (ref 7.0–26.0)
CO2: 26 meq/L (ref 22–29)
CREATININE: 0.6 mg/dL (ref 0.6–1.1)
Calcium: 9.9 mg/dL (ref 8.4–10.4)
Chloride: 101 mEq/L (ref 98–109)
Glucose: 110 mg/dl (ref 70–140)
Potassium: 3.9 mEq/L (ref 3.5–5.1)
Sodium: 138 mEq/L (ref 136–145)
Total Protein: 6.8 g/dL (ref 6.4–8.3)

## 2014-01-20 LAB — CBC WITH DIFFERENTIAL/PLATELET
BASO%: 0.6 % (ref 0.0–2.0)
Basophils Absolute: 0.1 10*3/uL (ref 0.0–0.1)
EOS%: 0.3 % (ref 0.0–7.0)
Eosinophils Absolute: 0 10*3/uL (ref 0.0–0.5)
HCT: 39.2 % (ref 34.8–46.6)
HEMOGLOBIN: 13.5 g/dL (ref 11.6–15.9)
LYMPH#: 2.1 10*3/uL (ref 0.9–3.3)
LYMPH%: 20.9 % (ref 14.0–49.7)
MCH: 33.1 pg (ref 25.1–34.0)
MCHC: 34.4 g/dL (ref 31.5–36.0)
MCV: 96.2 fL (ref 79.5–101.0)
MONO#: 0.8 10*3/uL (ref 0.1–0.9)
MONO%: 7.9 % (ref 0.0–14.0)
NEUT#: 7 10*3/uL — ABNORMAL HIGH (ref 1.5–6.5)
NEUT%: 70.3 % (ref 38.4–76.8)
Platelets: 441 10*3/uL — ABNORMAL HIGH (ref 145–400)
RBC: 4.07 10*6/uL (ref 3.70–5.45)
RDW: 17.4 % — AB (ref 11.2–14.5)
WBC: 9.9 10*3/uL (ref 3.9–10.3)

## 2014-01-20 LAB — TECHNOLOGIST REVIEW

## 2014-01-20 NOTE — Assessment & Plan Note (Signed)
She is doing very well. She is now off anbx. I have asked her to call if she has any further issues- fever, chills, swelling, pain, erythema.  She will rtc prn.

## 2014-01-20 NOTE — Progress Notes (Signed)
   Subjective:    Patient ID: [name] [name], female    DOB: 1961/08/04, 53 y.o.   MRN: [id number]  HPI 53 yo F with hx of resection of cerebellar tumor on 11-19-13 (Path- malignant, metastatic neoplasm of unclear origin). Her CT chest/abd showed:1. Small spiculated nodule in the superior segment of the left lower lobe in the setting of the intracranial metastasis is concerning for metastatic lesion versus less likely primary lesion.  2. Sub solid nodule in the right lower lobe is nonspecific.  3. No evidence of primary lesion in the chest abdomen or pelvis.  She returned to hospital on 12-03-13 with n/v, headaches. Her CT showed expected post-op changes and some fluid over the op site. By hospital day 2 she developed wound d/c. On 1-8 she was taken to OR and underwent debridement of wound infection, Cx grew MSSA. She was started on vancomycin, then changed to Ancef. She was d/c home on 1-12 with plan for 1 month of therapy.  She completed anbx on 01-16-14 and PIC removed.  She completed XRT on 01-16-14.  F/u PET scan on 01-02-14: 1. The 5 x 4 mm nodule in the superior segment left lower lobe is  hypermetabolic, concerning for malignancy. Active granulomatous  process is a less likely differential diagnostic consideration.  2. Bilateral nonobstructive nephrolithiasis.  3. 6 mm hyperdense lesion in the left mid kidney, probably a complex  cysts but technically nonspecific. Occas tired and has headache. Wound is well healed, no further d/c. No fever or chills.   Review of Systems  Constitutional: Negative for fever, chills and appetite change.       Hair falling out.   Gastrointestinal: Negative for diarrhea and constipation.  Genitourinary: Negative for difficulty urinating.       Objective:   Physical Exam  Constitutional: She appears well-developed and well-nourished.  HENT:  Mouth/Throat: No oropharyngeal exudate.  Eyes: EOM are normal. Pupils are equal, round, and reactive to light.    Neck: Neck supple.  Cardiovascular: Normal rate, regular rhythm and normal heart sounds.   Pulmonary/Chest: Effort normal and breath sounds normal.  Abdominal: Soft. Bowel sounds are normal. She exhibits no distension. There is no tenderness.  Musculoskeletal:       Arms: Lymphadenopathy:    She has no cervical adenopathy.          Assessment & Plan:

## 2014-01-20 NOTE — Telephone Encounter (Signed)
Gave pt appt for lab and MD, gave pt referral to HIM

## 2014-01-20 NOTE — Progress Notes (Signed)
[facility] [phone]   Fax:([phone]) [place], MD [address] [place] [address]  DIAGNOSIS: Metastatic malignant neoplasm highly suspicious for metastatic melanoma with brain metastasis diagnosed in December of 2014. MOLECULAR BIOMARKERS: (Foundation one) Showed POSITIVE BRAFV600E, PTEN loss, BARD1 splice site 1410_[id number], CDKN2A loss Exons 1-2, LYN amplification, MYC amplification, KDM6A Q1369f*17.  PRIOR THERAPY: 1) status post Right suboccipital craniectomy for removal of right cerebellar mass utilizing frameless stereotactic stealth guidance.  The final pathology (Accession: [id number] showed malignant neoplasm on 11/19/2013 under the care of Dr. DSherley Bounds  2) stereotactic radio surgery to the resection cavity on 12/26/2013 under the care of Dr. MTammi Klippel  CURRENT THERAPY: None.  INTERVAL HISTORY: [name] LHEUREUX53y.o. female returns to the clinic today for accompanied by her husband for followup visit. The patient is feeling fine today with no specific complaints except for occasional headache. She denied having any significant chest pain, shortness of breath, cough or hemoptysis. She has no nausea or vomiting, no fever or chills. She has no significant weight loss or night sweats. The molecular biomarkers by Foundation one showed positive BRAF V600E, which usually seen with malignant melanoma. The patient is here today for evaluation and discussion of her treatment options.  MEDICAL HISTORY: Past Medical History  Diagnosis Date  . TMJ (dislocation of temporomandibular joint)   . Thyroid disease   . Lung cancer     awaiting pathology  . Brain cancer     awaiting pathology; assuming this is a lung primary with brain mets    ALLERGIES:  is allergic to morphine and related.  MEDICATIONS:  Current Outpatient Prescriptions  Medication Sig Dispense Refill  . acetaminophen-codeine  (TYLENOL #3) 300-30 MG per tablet Take 1 tablet by mouth every 8 (eight) hours as needed for moderate pain.      . bisacodyl (DULCOLAX) 5 MG EC tablet Take 5 mg by mouth daily as needed for moderate constipation.      .Marland Kitchendexamethasone (DECADRON) 2 MG tablet Take 1 tablet (2 mg total) by mouth 3 (three) times daily. Starting 11/23/13:  Take 1 tablet (2 mg)  four times daily for three days, then take one tablet twice daily for three days, then take one tablet daily until further instructions from md.  60 tablet  1  . famotidine (PEPCID) 20 MG tablet Take 1 tablet (20 mg total) by mouth 2 (two) times daily.  60 tablet  5  . levothyroxine (SYNTHROID, LEVOTHROID) 125 MCG tablet Take 125 mcg by mouth daily before breakfast.      . methocarbamol (ROBAXIN) 500 MG tablet Take 250-500 mg by mouth every 6 (six) hours as needed for muscle spasms.      . nicotine (NICODERM CQ - DOSED IN MG/24 HOURS) 14 mg/24hr patch Place 1 patch (14 mg total) onto the skin daily.  28 patch  0  . piroxicam (FELDENE) 20 MG capsule Take 20 mg by mouth daily.       No current facility-administered medications for this visit.    SURGICAL HISTORY:  Past Surgical History  Procedure Laterality Date  . Tonsillectomy    . Appendectomy    . Suboccipital craniectomy cervical laminectomy N/A 11/19/2013    Procedure: SUBOCCIPITAL CRANIECTOMY for tumor;  Surgeon: DEustace Moore MD;  [facility];  Service: Neurosurgery;  Laterality: N/A;  . Lumbar wound debridement N/A 12/05/2013    Procedure:  irrigation and debridement posterior cervical wound;  Surgeon: Eustace Moore, MD;  Location: [facility];  Service: Neurosurgery;  Laterality: N/A;    REVIEW OF SYSTEMS:  Constitutional: negative Eyes: negative Ears, nose, mouth, throat, and face: negative Respiratory: negative Cardiovascular: negative Gastrointestinal: negative Genitourinary:negative Integument/breast: negative Hematologic/lymphatic:  negative Musculoskeletal:negative Neurological: positive for headaches Behavioral/Psych: negative Endocrine: negative Allergic/Immunologic: negative   PHYSICAL EXAMINATION: General appearance: alert, cooperative and no distress Head: Normocephalic, without obvious abnormality, atraumatic Neck: no adenopathy, no JVD, supple, symmetrical, trachea midline and thyroid not enlarged, symmetric, no tenderness/mass/nodules Lymph nodes: Cervical, supraclavicular, and axillary nodes normal. Resp: clear to auscultation bilaterally Back: symmetric, no curvature. ROM normal. No CVA tenderness. Cardio: regular rate and rhythm, S1, S2 normal, no murmur, click, rub or gallop GI: soft, non-tender; bowel sounds normal; no masses,  no organomegaly Extremities: extremities normal, atraumatic, no cyanosis or edema Neurologic: Alert and oriented X 3, normal strength and tone. Normal symmetric reflexes. Normal coordination and gait  ECOG PERFORMANCE STATUS: 1 - Symptomatic but completely ambulatory  Blood pressure 106/69, pulse 78, temperature 97.7 F (36.5 C), temperature source Oral, resp. rate 18, height _0  (1.702 m), weight 130 lb 1.6 oz (59.013 kg).  LABORATORY DATA: Lab Results  Component Value Date   WBC 9.9 01/20/2014   HGB 13.5 01/20/2014   HCT 39.2 01/20/2014   MCV 96.2 01/20/2014   PLT 441* 01/20/2014      Chemistry      Component Value Date/Time   NA 136 12/23/2013 1356   NA 131* 12/03/2013 2018   K 3.9 12/23/2013 1356   K 3.6* 12/03/2013 2018   CL 89* 12/03/2013 2018   CO2 25 12/23/2013 1356   CO2 25 12/03/2013 2018   BUN 19.3 12/23/2013 1356   BUN 23 12/03/2013 2018   CREATININE 0.6 12/23/2013 1356   CREATININE 0.53 12/03/2013 2018      Component Value Date/Time   CALCIUM 8.4 12/23/2013 1356   CALCIUM 9.7 12/03/2013 2018   ALKPHOS 78 12/23/2013 1356   ALKPHOS 105 11/15/2013 0700   AST 12 12/23/2013 1356   AST 13 11/15/2013 0700   ALT 9 12/23/2013 1356   ALT 13 11/15/2013 0700   BILITOT 0.26  12/23/2013 1356   BILITOT 0.4 11/15/2013 0700       RADIOGRAPHIC STUDIES: Nm Pet Image Initial (pi) Skull Base To Thigh  01/02/2014   CLINICAL DATA:  Initial treatment strategy for Lung nodule.  EXAM: NUCLEAR MEDICINE PET SKULL BASE TO THIGH  FASTING BLOOD GLUCOSE:  Value: 15m/dl  TECHNIQUE: 7.6 mCi F-18 FDG was injected intravenously. CT data was obtained and used for attenuation correction and anatomic localization only. (This was not acquired as a diagnostic CT examination.) Additional exam technical data entered on technologist worksheet.  COMPARISON:  CT CHEST W/CM dated 11/15/2013  FINDINGS: NECK  No hypermetabolic lymph nodes in the neck.  CHEST  5 x 4 mm nodule in the superior segment left lower lobe is hypermetabolic. Although the maximum standard uptake value is only 1.9, this is still disproportionate to the very small size of this lesion. Previously seen ground-glass nodule along the right major fissure is not well seen although there is surrounding dependent subsegmental atelectasis. Mild lingular and right middle lobe subsegmental atelectasis observed.  ABDOMEN/PELVIS  Bowel activity and anal activity, likely physiologic. Fast no findings suspicious for malignancy in the abdomen or pelvis.  Aortoiliac atherosclerotic vascular disease. 2 mm right mid kidney nonobstructive calculus. 1 mm left kidney lower  pole nonobstructive calculus. Nonspecific 6 mm hyperdense lesion in the left mid kidney laterally, probably a complex cyst but technically nonspecific.  There is a borderline dilated loop of proximal jejunum.  SKELETON  No focal hypermetabolic activity to suggest skeletal metastasis.  IMPRESSION: 1. The 5 x 4 mm nodule in the superior segment left lower lobe is hypermetabolic, concerning for malignancy. Active granulomatous process is a less likely differential diagnostic consideration. 2. Bilateral nonobstructive nephrolithiasis. 3. 6 mm hyperdense lesion in the left mid kidney, probably a  complex cysts but technically nonspecific.   Electronically Signed   By: Sherryl Barters M.D.   On: 01/02/2014 15:17    ASSESSMENT AND PLAN: This is a very pleasant 53 years old white female with metastatic malignant neoplasm of unclear etiology but most likely malignant melanoma especially with the nuclear biomarker findings of BRAF V600E. Her PET scan performed recently showed no other evidence for metastatic disease except for the 5 x 4 mm nodule in the superior segment of the left lower lobe that was hypermetabolic consistent with malignancy. I have a lengthy discussion with the patient and her husband today about her current disease status and treatment options. I recommended for the patient treatment with Zelboraf versus continuous observation and close monitoring. The patient would like to have a second opinion about her current condition. I will refer her to Dr. Clance Boll at [facility] for evaluation. I would see the patient back for followup visit in 3 weeks for discussion of her treatment options after seeing Dr. Clance Boll. She was advised to call me immediately if she has any concerning symptoms in the interval. The patient voices understanding of current disease status and treatment options and is in agreement with the current care plan.  All questions were answered. The patient knows to call the clinic with any problems, questions or concerns. We can certainly see the patient much sooner if necessary.  I spent 20 minutes counseling the patient face to face. The total time spent in the appointment was 30 minutes.  Disclaimer: This note was dictated with voice recognition software. Similar sounding words can inadvertently be transcribed and may not be corrected upon review.

## 2014-01-20 NOTE — Patient Instructions (Signed)
Referral to Dr. Clance Boll at [facility] for second opinion. Followup visit in 3 weeks.

## 2014-01-21 ENCOUNTER — Telehealth: Payer: Self-pay | Admitting: Internal Medicine

## 2014-01-21 NOTE — Telephone Encounter (Signed)
Pt  appt with Dr. Clance Boll @ [facility] is 01/27/14@1 :00. Medical records faxed,scans will be fedex'ed.  Susie from [facility] will request slides if needed. Pt is aware

## 2014-01-22 ENCOUNTER — Encounter: Payer: Self-pay | Admitting: Infectious Diseases

## 2014-01-22 ENCOUNTER — Telehealth: Payer: Self-pay | Admitting: Internal Medicine

## 2014-01-22 NOTE — Telephone Encounter (Signed)
Faxed pt medical records to Dr. Tommi Rumps at [facility]. Pt wants a second opinion.

## 2014-01-22 NOTE — Progress Notes (Signed)
  Radiation Oncology         [phone]) [phone] ________________________________  Name: [name] [name] MRN: [id number]  Date: 01/16/2014  DOB: 1961-04-24  End of Treatment Note  Diagnosis:   53 yo woman s/p resection of a cerebellar BRAF-positive malignant neoplasm  Indication for treatment:  Palliative, Optimize Local Control of Brain Metastasis.       Radiation treatment dates:   12/26/2013-01/16/2014  Site/dose:    1.  The posterior fossa was initially treated to 30 Gy in 12 fractions of 2.5 Gy 2.  The resection cavity was boosted to 40 Gy with 4 more fractions of 2.5 Gy  Beams/energy:    1.  The posterior fossa was initially treated using RPO and LPO fields with 6 MV X-rays and field in field compensation. 2.  The resection cavity was boosted using RPO and LPO fields with 6 MV X-rays  Narrative: The patient tolerated radiation treatment relatively well.   Hair loss was noted.  Steroids were tapered off.  Plan: The patient has completed radiation treatment. The patient will return to radiation oncology clinic for routine followup in one month. I advised them to call or return sooner if they have any questions or concerns related to their recovery or treatment. ________________________________  Sheral Apley. Tammi Klippel, M.D.

## 2014-01-25 ENCOUNTER — Emergency Department (HOSPITAL_COMMUNITY): Payer: 59

## 2014-01-25 ENCOUNTER — Encounter (HOSPITAL_COMMUNITY): Payer: Self-pay | Admitting: Emergency Medicine

## 2014-01-25 ENCOUNTER — Emergency Department (HOSPITAL_COMMUNITY)
Admission: EM | Admit: 2014-01-25 | Discharge: 2014-01-25 | Disposition: A | Discharge status: Home / Self Care | Payer: 59 | Attending: Emergency Medicine | Admitting: Emergency Medicine

## 2014-01-25 DIAGNOSIS — Z79899 Other long term (current) drug therapy: Secondary | ICD-10-CM | POA: Insufficient documentation

## 2014-01-25 DIAGNOSIS — Z87891 Personal history of nicotine dependence: Secondary | ICD-10-CM | POA: Insufficient documentation

## 2014-01-25 DIAGNOSIS — Z9889 Other specified postprocedural states: Secondary | ICD-10-CM | POA: Insufficient documentation

## 2014-01-25 DIAGNOSIS — E079 Disorder of thyroid, unspecified: Secondary | ICD-10-CM | POA: Insufficient documentation

## 2014-01-25 DIAGNOSIS — Z85118 Personal history of other malignant neoplasm of bronchus and lung: Secondary | ICD-10-CM | POA: Insufficient documentation

## 2014-01-25 DIAGNOSIS — Z87828 Personal history of other (healed) physical injury and trauma: Secondary | ICD-10-CM | POA: Insufficient documentation

## 2014-01-25 DIAGNOSIS — Z85841 Personal history of malignant neoplasm of brain: Secondary | ICD-10-CM | POA: Insufficient documentation

## 2014-01-25 DIAGNOSIS — N200 Calculus of kidney: Secondary | ICD-10-CM

## 2014-01-25 LAB — CBC WITH DIFFERENTIAL/PLATELET
BASOS PCT: 0 % (ref 0–1)
Basophils Absolute: 0.1 10*3/uL (ref 0.0–0.1)
Eosinophils Absolute: 0.1 10*3/uL (ref 0.0–0.7)
Eosinophils Relative: 1 % (ref 0–5)
HEMATOCRIT: 37.8 % (ref 36.0–46.0)
HEMOGLOBIN: 12.9 g/dL (ref 12.0–15.0)
LYMPHS ABS: 1.6 10*3/uL (ref 0.7–4.0)
LYMPHS PCT: 13 % (ref 12–46)
MCH: 32.9 pg (ref 26.0–34.0)
MCHC: 34.1 g/dL (ref 30.0–36.0)
MCV: 96.4 fL (ref 78.0–100.0)
MONOS PCT: 12 % (ref 3–12)
Monocytes Absolute: 1.5 10*3/uL — ABNORMAL HIGH (ref 0.1–1.0)
NEUTROS PCT: 75 % (ref 43–77)
Neutro Abs: 9.7 10*3/uL — ABNORMAL HIGH (ref 1.7–7.7)
Platelets: 347 10*3/uL (ref 150–400)
RBC: 3.92 MIL/uL (ref 3.87–5.11)
RDW: 16.3 % — ABNORMAL HIGH (ref 11.5–15.5)
WBC: 13 10*3/uL — ABNORMAL HIGH (ref 4.0–10.5)

## 2014-01-25 LAB — COMPREHENSIVE METABOLIC PANEL
ALBUMIN: 3.6 g/dL (ref 3.5–5.2)
ALK PHOS: 118 U/L — AB (ref 39–117)
ALT: 20 U/L (ref 0–35)
AST: 19 U/L (ref 0–37)
BILIRUBIN TOTAL: 0.4 mg/dL (ref 0.3–1.2)
BUN: 14 mg/dL (ref 6–23)
CO2: 25 mEq/L (ref 19–32)
Calcium: 9.9 mg/dL (ref 8.4–10.5)
Chloride: 96 mEq/L (ref 96–112)
Creatinine, Ser: 0.7 mg/dL (ref 0.50–1.10)
GFR calc Af Amer: >90 mL/min (ref >90)
GFR calc non Af Amer: >90 mL/min (ref >90)
GLUCOSE: 183 mg/dL — AB (ref 70–99)
POTASSIUM: 3.1 meq/L — AB (ref 3.7–5.3)
Sodium: 137 mEq/L (ref 137–147)
Total Protein: 6.9 g/dL (ref 6.0–8.3)

## 2014-01-25 LAB — URINALYSIS, ROUTINE W REFLEX MICROSCOPIC
BILIRUBIN URINE: NEGATIVE
Glucose, UA: NEGATIVE mg/dL
Hgb urine dipstick: NEGATIVE
KETONES UR: NEGATIVE mg/dL
Leukocytes, UA: NEGATIVE
NITRITE: NEGATIVE
PH: 5.5 (ref 5.0–8.0)
PROTEIN: NEGATIVE mg/dL
Specific Gravity, Urine: 1.014 (ref 1.005–1.030)
Urobilinogen, UA: 0.2 mg/dL (ref 0.0–1.0)

## 2014-01-25 LAB — LIPASE, BLOOD: Lipase: 14 U/L (ref 11–59)

## 2014-01-25 MED ORDER — ONDANSETRON 8 MG PO TBDP: 8.0000 mg | ORAL_TABLET | Freq: Three times a day (TID) | ORAL | Status: DC | PRN

## 2014-01-25 MED ORDER — ONDANSETRON HCL 4 MG/2ML IJ SOLN
4.0000 mg | Freq: Once | INTRAMUSCULAR | Status: AC
  Administered 2014-01-25: 4 mg via INTRAVENOUS
  Filled 2014-01-25: qty 2

## 2014-01-25 MED ORDER — OXYCODONE-ACETAMINOPHEN 5-325 MG PO TABS: 2.0000 | ORAL_TABLET | ORAL | Status: DC | PRN

## 2014-01-25 MED ORDER — IOHEXOL 300 MG/ML  SOLN
25.0000 mL | INTRAMUSCULAR | Status: AC | PRN
  Administered 2014-01-25 (×2): 25 mL via ORAL

## 2014-01-25 MED ORDER — PROMETHAZINE HCL 25 MG PO TABS: 25.0000 mg | ORAL_TABLET | Freq: Four times a day (QID) | ORAL | Status: DC | PRN

## 2014-01-25 MED ORDER — KETOROLAC TROMETHAMINE 30 MG/ML IJ SOLN
30.0000 mg | Freq: Once | INTRAMUSCULAR | Status: AC
  Administered 2014-01-25: 30 mg via INTRAVENOUS
  Filled 2014-01-25: qty 1

## 2014-01-25 MED ORDER — IOHEXOL 300 MG/ML  SOLN
100.0000 mL | Freq: Once | INTRAMUSCULAR | Status: AC | PRN
  Administered 2014-01-25: 100 mL via INTRAVENOUS

## 2014-01-25 MED ORDER — PROMETHAZINE HCL 25 MG/ML IJ SOLN
12.5000 mg | Freq: Once | INTRAMUSCULAR | Status: AC
  Administered 2014-01-25: 12.5 mg via INTRAVENOUS
  Filled 2014-01-25: qty 1

## 2014-01-25 MED ORDER — HYDROMORPHONE HCL PF 1 MG/ML IJ SOLN
1.0000 mg | Freq: Once | INTRAMUSCULAR | Status: AC
  Administered 2014-01-25: 1 mg via INTRAVENOUS
  Filled 2014-01-25: qty 1

## 2014-01-25 NOTE — ED Notes (Signed)
Heat pack given to pt per request.

## 2014-01-25 NOTE — ED Notes (Signed)
Pt ambulated to restroom with no difficulty.

## 2014-01-25 NOTE — ED Provider Notes (Signed)
CSN: [id number]     Arrival date & time 01/25/14  2595 History   First MD Initiated Contact with Patient 01/25/14 0354     Chief Complaint  Patient presents with  . Abdominal Pain     (Consider location/radiation/quality/duration/timing/severity/associated sxs/prior Treatment) HPI 53 yo female presents to the ER from home with acute onset of right lower abdominal pain with n/v.  Pt is s/p appendectomy.  No vaginal sxs, no urinary symptoms.  No fever, chills.  No diarrhea.  Pain woke her from sleep.  No prior h/o same.  Pt s/p brain surgery 2 months ago for tumor.   Past Medical History  Diagnosis Date  . TMJ (dislocation of temporomandibular joint)   . Thyroid disease   . Lung cancer     awaiting pathology  . Brain cancer     awaiting pathology; assuming this is a lung primary with brain mets   Past Surgical History  Procedure Laterality Date  . Tonsillectomy    . Appendectomy    . Suboccipital craniectomy cervical laminectomy N/A 11/19/2013    Procedure: SUBOCCIPITAL CRANIECTOMY for tumor;  Surgeon: Eustace Moore, MD;  Location: [place] [facility];  Service: Neurosurgery;  Laterality: N/A;  . Lumbar wound debridement N/A 12/05/2013    Procedure: irrigation and debridement posterior cervical wound;  Surgeon: Eustace Moore, MD;  Location: [place] [facility];  Service: Neurosurgery;  Laterality: N/A;   Family History  Problem Relation Age of Onset  . Dementia Mother    History  Substance Use Topics  . Smoking status: Former Smoker    Quit date: 11/19/2013  . Smokeless tobacco: Never Used  . Alcohol Use: No   OB History   Grav Para Term Preterm Abortions TAB SAB Ect Mult Living                 Review of Systems  All other systems reviewed and are negative.      Allergies  Morphine and related  Home Medications   Current Outpatient Rx  Name  Route  Sig  Dispense  Refill  . dexamethasone (DECADRON) 1 MG tablet   Oral   Take 0.5 mg by mouth every other day.         .  famotidine (PEPCID) 20 MG tablet   Oral   Take 1 tablet (20 mg total) by mouth 2 (two) times daily.   60 tablet   5   . levothyroxine (SYNTHROID, LEVOTHROID) 125 MCG tablet   Oral   Take 125 mcg by mouth daily before breakfast.         . piroxicam (FELDENE) 20 MG capsule   Oral   Take 20 mg by mouth daily.         . ondansetron (ZOFRAN ODT) 8 MG disintegrating tablet   Oral   Take 1 tablet (8 mg total) by mouth every 8 (eight) hours as needed for nausea or vomiting.   20 tablet   0   . oxyCODONE-acetaminophen (PERCOCET/ROXICET) 5-325 MG per tablet   Oral   Take 2 tablets by mouth every 4 (four) hours as needed for severe pain.   20 tablet   0   . promethazine (PHENERGAN) 25 MG tablet   Oral   Take 1 tablet (25 mg total) by mouth every 6 (six) hours as needed for nausea.   30 tablet   0    BP 116/72  Pulse 70  Temp(Src) 97.8 F (36.6 C) (Oral)  Resp 18  SpO2 99% Physical Exam  Nursing note and vitals reviewed. Constitutional: She is oriented to person, place, and time. She appears well-developed and well-nourished. She appears distressed.  HENT:  Head: Normocephalic and atraumatic.  Nose: Nose normal.  Mouth/Throat: Oropharynx is clear and moist.  Eyes: Conjunctivae and EOM are normal. Pupils are equal, round, and reactive to light.  Neck: Normal range of motion. Neck supple. No JVD present. No tracheal deviation present. No thyromegaly present.  Cardiovascular: Normal rate, regular rhythm, normal heart sounds and intact distal pulses.  Exam reveals no gallop and no friction rub.   No murmur heard. Pulmonary/Chest: Effort normal and breath sounds normal. No stridor. No respiratory distress. She has no wheezes. She has no rales. She exhibits no tenderness.  Abdominal: Soft. Bowel sounds are normal. She exhibits no distension and no mass. There is no tenderness. There is no rebound and no guarding.  abd is nontender to palpation  Musculoskeletal: Normal range of  motion. She exhibits no edema and no tenderness.  Lymphadenopathy:    She has no cervical adenopathy.  Neurological: She is alert and oriented to person, place, and time. She has normal reflexes. She exhibits normal muscle tone. Coordination normal.  Skin: Skin is warm and dry. No rash noted. No erythema. No pallor.  Psychiatric: She has a normal mood and affect. Her behavior is normal. Judgment and thought content normal.    ED Course  Procedures (including critical care time) Labs Review Labs Reviewed  CBC WITH DIFFERENTIAL - Abnormal; Notable for the following:    WBC 13.0 (*)    RDW 16.3 (*)    Neutro Abs 9.7 (*)    Monocytes Absolute 1.5 (*)    All other components within normal limits  COMPREHENSIVE METABOLIC PANEL - Abnormal; Notable for the following:    Potassium 3.1 (*)    Glucose, Bld 183 (*)    Alkaline Phosphatase 118 (*)    All other components within normal limits  LIPASE, BLOOD  URINALYSIS, ROUTINE W REFLEX MICROSCOPIC   Imaging Review Ct Abdomen Pelvis W Contrast  01/25/2014   CLINICAL DATA:  Right lower quadrant abdominal pain and nausea. History of melanoma with metastases.  EXAM: CT ABDOMEN AND PELVIS WITH CONTRAST  TECHNIQUE: Multidetector CT imaging of the abdomen and pelvis was performed using the standard protocol following bolus administration of intravenous contrast.  CONTRAST:  176mL OMNIPAQUE IOHEXOL 300 MG/ML  SOLN  COMPARISON:  PET/CT performed 01/02/2014  FINDINGS: Mild right basilar nodular atelectasis or scarring is noted.  The liver and spleen are unremarkable in appearance. The gallbladder is within normal limits. The pancreas and adrenal glands are unremarkable.  There is moderate right-sided hydronephrosis, diffuse right-sided perinephric stranding and fluid, and prominence of the right ureter to the level of an obstructing 3 mm stone within the distal right ureter, 1 cm proximal to the right vesicoureteral junction. No nonobstructing renal stones are  identified. Apparent areas of renal cortical thinning at the left kidney are thought to reflect scarring and small cysts; there is no definite evidence for pyelonephritis.  No free fluid is identified. The small bowel is unremarkable in appearance. The stomach is within normal limits. No acute vascular abnormalities are seen. Relatively diffuse calcification is seen along the abdominal aorta and its branches.  The patient is status post appendectomy. The colon is unremarkable in appearance, aside from a single diverticulum noted along the mid sigmoid colon.  The bladder is relatively decompressed and grossly unremarkable in appearance. The uterus  is within normal limits. The ovaries are relatively symmetric; no suspicious adnexal masses are seen. No inguinal lymphadenopathy is seen.  No acute osseous abnormalities are identified.  IMPRESSION: 1. Moderate right-sided hydronephrosis, with diffuse right-sided perinephric stranding and fluid, and an obstructing 3 mm stone noted distally within the right ureter, 1 cm proximal to the right vesicoureteral junction. 2. Areas of renal cortical thinning at the left kidney are thought to reflect scarring and small cysts; no evidence for pyelonephritis. 3. Relatively diffuse calcification along the abdominal aorta and its branches. 4. Mild right basilar nodular atelectasis or scarring noted.   Electronically Signed   By: Garald Balding M.D.   On: 01/25/2014 06:28     EKG Interpretation None      MDM   Final diagnoses:  Kidney stone on right side    53 year old female with acute onset of right lower quadrant pain at midnight waking her from sleep.  Patient is status post appendectomy as a young adult.  Recent diagnosis of probable melanoma with tumor in brain and possible in lung.  She's had nausea and vomiting with pain.  Workup shows a 3 mm stone in right ureter.  After dilaudid she is feeling much better.  Patient to follow up with urology and has been given  pain and nausea medicines.   Kalman Drape, MD 01/27/14 [phone]

## 2014-01-25 NOTE — Discharge Instructions (Signed)
You have a 3 mm kidney stone in your right ureter, about to pass into your bladder.  If you have problems over the weekend, the urology specialists prefer you be seen in the [facility].  Take medications as prescribed. Return to the ER for worsening pain, fever, or other new concerning symptoms.   Kidney Stones Kidney stones (urolithiasis) are deposits that form inside your kidneys. The intense pain is caused by the stone moving through the urinary tract. When the stone moves, the ureter goes into spasm around the stone. The stone is usually passed in the urine.  CAUSES   A disorder that makes certain neck glands produce too much parathyroid hormone (primary hyperparathyroidism).  A buildup of uric acid crystals, similar to gout in your joints.  Narrowing (stricture) of the ureter.  A kidney obstruction present at birth (congenital obstruction).  Previous surgery on the kidney or ureters.  Numerous kidney infections. SYMPTOMS   Feeling sick to your stomach (nauseous).  Throwing up (vomiting).  Blood in the urine (hematuria).  Pain that usually spreads (radiates) to the groin.  Frequency or urgency of urination. DIAGNOSIS   Taking a history and physical exam.  Blood or urine tests.  CT scan.  Occasionally, an examination of the inside of the urinary bladder (cystoscopy) is performed. TREATMENT   Observation.  Increasing your fluid intake.  Extracorporeal shock wave lithotripsy This is a noninvasive procedure that uses shock waves to break up kidney stones.  Surgery may be needed if you have severe pain or persistent obstruction. There are various surgical procedures. Most of the procedures are performed with the use of small instruments. Only small incisions are needed to accommodate these instruments, so recovery time is minimized. The size, location, and chemical composition are all important variables that will determine the proper choice of action for you. Talk  to your health care provider to better understand your situation so that you will minimize the risk of injury to yourself and your kidney.  HOME CARE INSTRUCTIONS   Drink enough water and fluids to keep your urine clear or pale yellow. This will help you to pass the stone or stone fragments.  Strain all urine through the provided strainer. Keep all particulate matter and stones for your health care provider to see. The stone causing the pain may be as small as a grain of salt. It is very important to use the strainer each and every time you pass your urine. The collection of your stone will allow your health care provider to analyze it and verify that a stone has actually passed. The stone analysis will often identify what you can do to reduce the incidence of recurrences.  Only take over-the-counter or prescription medicines for pain, discomfort, or fever as directed by your health care provider.  Make a follow-up appointment with your health care provider as directed.  Get follow-up X-rays if required. The absence of pain does not always mean that the stone has passed. It may have only stopped moving. If the urine remains completely obstructed, it can cause loss of kidney function or even complete destruction of the kidney. It is your responsibility to make sure X-rays and follow-ups are completed. Ultrasounds of the kidney can show blockages and the status of the kidney. Ultrasounds are not associated with any radiation and can be performed easily in a matter of minutes. SEEK MEDICAL CARE IF:  You experience pain that is progressive and unresponsive to any pain medicine you have been  prescribed. SEEK IMMEDIATE MEDICAL CARE IF:   Pain cannot be controlled with the prescribed medicine.  You have a fever or shaking chills.  The severity or intensity of pain increases over 18 hours and is not relieved by pain medicine.  You develop a new onset of abdominal pain.  You feel faint or pass  out.  You are unable to urinate. MAKE SURE YOU:   Understand these instructions.  Will watch your condition.  Will get help right away if you are not doing well or get worse. Document Released: 11/14/2005 Document Revised: 07/17/2013 Document Reviewed: 04/17/2013 [facility] Patient Information 2014 [place].  Urine Strainer This strainer is used to catch or filter out any stones found in your urine. Place the strainer under your urine stream. Save any stones or objects that you find in your urine. Place them in a plastic or glass container to show your caregiver. The stones vary in size - some can be very small, so make sure you check the strainer carefully. Your caregiver may send the stone to the lab. When the results are back, your caregiver may recommend medicines or diet changes.  Document Released: 08/19/2004 Document Revised: 02/06/2012 Document Reviewed: 09/26/2008 [facility] Patient Information 2014 [place].

## 2014-01-25 NOTE — ED Notes (Signed)
Pt from home with c/o RLQ pain.  Pt does not have appendix. Pain started around midnight.  Pt reporting nausea after fentanyl.  Pt given 150 mcg of Fentanyl and 4 mg of Zofran with minimal relief.

## 2014-01-25 NOTE — ED Notes (Signed)
Awaiting Phenergan from pharmacy

## 2014-01-27 ENCOUNTER — Encounter: Payer: Self-pay | Admitting: Radiation Oncology

## 2014-01-27 ENCOUNTER — Telehealth: Payer: Self-pay | Admitting: Internal Medicine

## 2014-01-27 NOTE — Progress Notes (Signed)
  Radiation Oncology         [id number]) [phone] ________________________________  Name: [name] [name] MRN: [id number]  Date: 01/27/2014  DOB: 18-Oct-1961  VIRTUAL SIMULATION NOTE  NARRATIVE:  The patient underwent simulation today for ongoing radiation therapy.  The existing CT study set was employed for the purpose of virtual treatment planning.  The target and avoidance structures were reviewed and in some cases modified.  Treatment planning then occurred.  The radiation boost prescription was entered and confirmed.  A total of 2 complex treatment devices were fabricated in the form of multi-leaf collimators to shape radiation around the targets while maximally excluding nearby normal structures. I have requested : Isodose Plan.   PLAN:  This modified radiation beam arrangement is intended to continue the current radiation dose to an additional 10 Gy in 4 fractions for a total cumulative dose of 2.5 Gy.  ------------------------------------------------  Sheral Apley. Tammi Klippel, M.D.

## 2014-01-27 NOTE — Telephone Encounter (Signed)
change time of 3/16 lbf/u from 1:15pm to 3pm. (provider resource mant.) lmonvm informing pt and mailed schedule.

## 2014-02-03 ENCOUNTER — Telehealth: Payer: Self-pay | Admitting: *Deleted

## 2014-02-03 NOTE — Telephone Encounter (Signed)
Message copied by Britt Bottom on Mon Feb 03, 2014  9:50 AM ------      Message from: Curt Bears      Created: Sat Feb 01, 2014 12:42 PM      Regarding: RE: sooner f/u / surgery?       I will discuss with her next visit      ----- Message -----         From: Anders Grant, RN         Sent: 01/30/2014   3:12 PM           To: Curt Bears, MD      Subject: sooner f/u / surgery?                                    Pt called stating she saw Dr Tama Gander and that you guys talked about removing the lung nodule.  She wanted to know when that would be done.   She has a f/u with you on 3/16.  Do you want to see her sooner or arrange anything before then?       ------

## 2014-02-03 NOTE — Telephone Encounter (Signed)
Spoke with pt, she verbalized understanding.  SLJ

## 2014-02-10 ENCOUNTER — Ambulatory Visit (HOSPITAL_BASED_OUTPATIENT_CLINIC_OR_DEPARTMENT_OTHER): Payer: 59 | Admitting: Internal Medicine

## 2014-02-10 ENCOUNTER — Other Ambulatory Visit (HOSPITAL_BASED_OUTPATIENT_CLINIC_OR_DEPARTMENT_OTHER): Payer: 59

## 2014-02-10 ENCOUNTER — Encounter: Payer: Self-pay | Admitting: Internal Medicine

## 2014-02-10 VITALS — BP 109/55 | HR 76 | Temp 97.9°F | Resp 19 | Ht 67.0 in | Wt 133.7 lb

## 2014-02-10 DIAGNOSIS — C7949 Secondary malignant neoplasm of other parts of nervous system: Secondary | ICD-10-CM

## 2014-02-10 DIAGNOSIS — C801 Malignant (primary) neoplasm, unspecified: Secondary | ICD-10-CM

## 2014-02-10 DIAGNOSIS — C7931 Secondary malignant neoplasm of brain: Secondary | ICD-10-CM

## 2014-02-10 DIAGNOSIS — R911 Solitary pulmonary nodule: Secondary | ICD-10-CM

## 2014-02-10 LAB — CBC WITH DIFFERENTIAL/PLATELET
BASO%: 0.9 % (ref 0.0–2.0)
Basophils Absolute: 0.1 10*3/uL (ref 0.0–0.1)
EOS%: 4.3 % (ref 0.0–7.0)
Eosinophils Absolute: 0.3 10*3/uL (ref 0.0–0.5)
HEMATOCRIT: 38.8 % (ref 34.8–46.6)
HEMOGLOBIN: 12.9 g/dL (ref 11.6–15.9)
LYMPH%: 18.8 % (ref 14.0–49.7)
MCH: 33.1 pg (ref 25.1–34.0)
MCHC: 33.4 g/dL (ref 31.5–36.0)
MCV: 99.3 fL (ref 79.5–101.0)
MONO#: 0.7 10*3/uL (ref 0.1–0.9)
MONO%: 8.5 % (ref 0.0–14.0)
NEUT#: 5.2 10*3/uL (ref 1.5–6.5)
NEUT%: 67.5 % (ref 38.4–76.8)
Platelets: 293 10*3/uL (ref 145–400)
RBC: 3.9 10*6/uL (ref 3.70–5.45)
RDW: 17 % — AB (ref 11.2–14.5)
WBC: 7.7 10*3/uL (ref 3.9–10.3)
lymph#: 1.5 10*3/uL (ref 0.9–3.3)

## 2014-02-10 LAB — COMPREHENSIVE METABOLIC PANEL (CC13)
ALK PHOS: 105 U/L (ref 40–150)
ALT: 17 U/L (ref 0–55)
AST: 15 U/L (ref 5–34)
Albumin: 3.6 g/dL (ref 3.5–5.0)
Anion Gap: 10 mEq/L (ref 3–11)
BILIRUBIN TOTAL: 0.48 mg/dL (ref 0.20–1.20)
BUN: 18.6 mg/dL (ref 7.0–26.0)
CO2: 24 mEq/L (ref 22–29)
CREATININE: 0.8 mg/dL (ref 0.6–1.1)
Calcium: 9.5 mg/dL (ref 8.4–10.4)
Chloride: 108 mEq/L (ref 98–109)
Glucose: 106 mg/dl (ref 70–140)
Potassium: 4.6 mEq/L (ref 3.5–5.1)
Sodium: 142 mEq/L (ref 136–145)
Total Protein: 6.5 g/dL (ref 6.4–8.3)

## 2014-02-10 LAB — LACTATE DEHYDROGENASE (CC13): LDH: 187 U/L (ref 125–245)

## 2014-02-10 NOTE — Progress Notes (Signed)
[place] [phone]   Fax:([phone]) [place], MD [address] [place] [address]  DIAGNOSIS: Metastatic malignant neoplasm highly suspicious for metastatic melanoma with brain metastasis diagnosed in December of 2014. MOLECULAR BIOMARKERS: (Foundation one) Showed POSITIVE BRAFV600E, PTEN loss, BARD1 splice site [id number], CDKN2A loss Exons 1-2, LYN amplification, MYC amplification, KDM6A [id number]*17.  PRIOR THERAPY: 1) status post Right suboccipital craniectomy for removal of right cerebellar mass utilizing frameless stereotactic stealth guidance.  The final pathology (Accession: [id number] showed malignant neoplasm on 11/19/2013 under the care of Dr. DSherley Bounds  2) stereotactic radio surgery to the resection cavity on 12/26/2013 under the care of Dr. MTammi Klippel  CURRENT THERAPY: Observation.  INTERVAL HISTORY: [name] SARKIS53y.o. female returns to the clinic today for accompanied by her husband for followup visit. The patient is feeling fine today with no specific complaints except for occasional sharp pain in her head that resolved spontaneously and it lasts only a few seconds. She denied having any significant chest pain, shortness of breath, cough or hemoptysis. She has no nausea or vomiting, no fever or chills. She has no significant weight loss or night sweats. The molecular biomarkers by Foundation one showed positive BRAF V600E, which usually seen with malignant melanoma. She was referred recently [facility] [place] [facility] [place] [facility] condition and he discussed with me considering referring the patient to thoracic surgery for excision of the pulmonary nodule. The patient is here today for evaluation and discussion of her treatment options.  MEDICAL HISTORY: Past Medical History  Diagnosis Date  . TMJ (dislocation of temporomandibular joint)   . Thyroid  disease   . Lung cancer     awaiting pathology  . Brain cancer     awaiting pathology; assuming this is a lung primary with brain mets    ALLERGIES:  is allergic to morphine and related.  MEDICATIONS:  Current Outpatient Prescriptions  Medication Sig Dispense Refill  . levothyroxine (SYNTHROID, LEVOTHROID) 125 MCG tablet Take 125 mcg by mouth daily before breakfast.      . methocarbamol (ROBAXIN) 500 MG tablet Take 500 mg by mouth as needed for muscle spasms.      .Marland Kitchenomeprazole (PRILOSEC) 10 MG capsule Take 10 mg by mouth daily.      .Marland KitchenoxyCODONE-acetaminophen (PERCOCET/ROXICET) 5-325 MG per tablet Take 2 tablets by mouth every 4 (four) hours as needed for severe pain.  20 tablet  0  . piroxicam (FELDENE) 20 MG capsule Take 20 mg by mouth daily.      . promethazine (PHENERGAN) 25 MG tablet Take 1 tablet (25 mg total) by mouth every 6 (six) hours as needed for nausea.  30 tablet  0   No current facility-administered medications for this visit.    SURGICAL HISTORY:  Past Surgical History  Procedure Laterality Date  . Tonsillectomy    . Appendectomy    . Suboccipital craniectomy cervical laminectomy N/A 11/19/2013    Procedure: SUBOCCIPITAL CRANIECTOMY for tumor;  Surgeon: DEustace Moore MD;  [facility];  Service: Neurosurgery;  Laterality: N/A;  . Lumbar wound debridement N/A 12/05/2013    Procedure: irrigation and debridement posterior cervical wound;  Surgeon: DEustace Moore MD;  [facility];  Service: Neurosurgery;  Laterality: N/A;    REVIEW OF SYSTEMS:  Constitutional: negative Eyes: negative Ears, nose, mouth, throat, and face: negative Respiratory:  negative Cardiovascular: negative Gastrointestinal: negative Genitourinary:negative Integument/breast: negative Hematologic/lymphatic: negative Musculoskeletal:negative Neurological: positive for headaches Behavioral/Psych: negative Endocrine: negative Allergic/Immunologic: negative   PHYSICAL  EXAMINATION: General appearance: alert, cooperative and no distress Head: Normocephalic, without obvious abnormality, atraumatic Neck: no adenopathy, no JVD, supple, symmetrical, trachea midline and thyroid not enlarged, symmetric, no tenderness/mass/nodules Lymph nodes: Cervical, supraclavicular, and axillary nodes normal. Resp: clear to auscultation bilaterally Back: symmetric, no curvature. ROM normal. No CVA tenderness. Cardio: regular rate and rhythm, S1, S2 normal, no murmur, click, rub or gallop GI: soft, non-tender; bowel sounds normal; no masses,  no organomegaly Extremities: extremities normal, atraumatic, no cyanosis or edema Neurologic: Alert and oriented X 3, normal strength and tone. Normal symmetric reflexes. Normal coordination and gait  ECOG PERFORMANCE STATUS: 1 - Symptomatic but completely ambulatory  Blood pressure 109/55, pulse 76, temperature 97.9 F (36.6 C), temperature source Oral, resp. rate 19, height 5' 7" (1.702 m), weight 133 lb 11.2 oz (60.646 kg).  LABORATORY DATA: Lab Results  Component Value Date   WBC 7.7 02/10/2014   HGB 12.9 02/10/2014   HCT 38.8 02/10/2014   MCV 99.3 02/10/2014   PLT 293 02/10/2014      Chemistry      Component Value Date/Time   NA 142 02/10/2014 1458   NA 137 01/25/2014 0255   K 4.6 02/10/2014 1458   K 3.1* 01/25/2014 0255   CL 96 01/25/2014 0255   CO2 24 02/10/2014 1458   CO2 25 01/25/2014 0255   BUN 18.6 02/10/2014 1458   BUN 14 01/25/2014 0255   CREATININE 0.8 02/10/2014 1458   CREATININE 0.70 01/25/2014 0255      Component Value Date/Time   CALCIUM 9.5 02/10/2014 1458   CALCIUM 9.9 01/25/2014 0255   ALKPHOS 105 02/10/2014 1458   ALKPHOS 118* 01/25/2014 0255   AST 15 02/10/2014 1458   AST 19 01/25/2014 0255   ALT 17 02/10/2014 1458   ALT 20 01/25/2014 0255   BILITOT 0.48 02/10/2014 1458   BILITOT 0.4 01/25/2014 0255       RADIOGRAPHIC STUDIES: Nm Pet Image Initial (pi) Skull Base To Thigh  01/02/2014   CLINICAL DATA:  Initial  treatment strategy for Lung nodule.  EXAM: NUCLEAR MEDICINE PET SKULL BASE TO THIGH  FASTING BLOOD GLUCOSE:  Value: 73m/dl  TECHNIQUE: 7.6 mCi F-18 FDG was injected intravenously. CT data was obtained and used for attenuation correction and anatomic localization only. (This was not acquired as a diagnostic CT examination.) Additional exam technical data entered on technologist worksheet.  COMPARISON:  CT CHEST W/CM dated 11/15/2013  FINDINGS: NECK  No hypermetabolic lymph nodes in the neck.  CHEST  5 x 4 mm nodule in the superior segment left lower lobe is hypermetabolic. Although the maximum standard uptake value is only 1.9, this is still disproportionate to the very small size of this lesion. Previously seen ground-glass nodule along the right major fissure is not well seen although there is surrounding dependent subsegmental atelectasis. Mild lingular and right middle lobe subsegmental atelectasis observed.  ABDOMEN/PELVIS  Bowel activity and anal activity, likely physiologic. Fast no findings suspicious for malignancy in the abdomen or pelvis.  Aortoiliac atherosclerotic vascular disease. 2 mm right mid kidney nonobstructive calculus. 1 mm left kidney lower pole nonobstructive calculus. Nonspecific 6 mm hyperdense lesion in the left mid kidney laterally, probably a complex cyst but technically nonspecific.  There is a borderline dilated loop of proximal jejunum.  SKELETON  No focal hypermetabolic activity to suggest skeletal metastasis.  IMPRESSION: 1. The 5 x 4 mm nodule in the superior segment left lower lobe is hypermetabolic, concerning for malignancy. Active granulomatous process is a less likely differential diagnostic consideration. 2. Bilateral nonobstructive nephrolithiasis. 3. 6 mm hyperdense lesion in the left mid kidney, probably a complex cysts but technically nonspecific.   Electronically Signed   By: Sherryl Barters M.D.   On: 01/02/2014 15:17    ASSESSMENT AND PLAN: This is a very pleasant  53 years old white female with metastatic malignant neoplasm of unclear etiology but most likely malignant melanoma especially with the nuclear biomarker findings of BRAF V600E. Her PET scan performed recently showed no other evidence for metastatic disease except for the 5 x 4 mm nodule in the superior segment of the left lower lobe that was hypermetabolic consistent with malignancy. I have a lengthy discussion with the patient today about her condition. I discussed with her the option of referring her to cardiothoracic surgery for consideration of surgical resection of the left lower lobe nodule. She would like to discuss this option with the surgeon. I will arrange a followup appointment for her after her surgical evaluation. She was advised to call me immediately if she has any concerning symptoms in the interval. The patient voices understanding of current disease status and treatment options and is in agreement with the current care plan.  All questions were answered. The patient knows to call the clinic with any problems, questions or concerns. We can certainly see the patient much sooner if necessary.   Disclaimer: This note was dictated with voice recognition software. Similar sounding words can inadvertently be transcribed and may not be corrected upon review.

## 2014-02-11 ENCOUNTER — Telehealth: Payer: Self-pay | Admitting: *Deleted

## 2014-02-11 NOTE — Telephone Encounter (Signed)
Called pt she is aware of appt for [name] [name] on 02/20/14.  She verbalized understanding of time and place of appt.

## 2014-02-13 ENCOUNTER — Encounter: Payer: Self-pay | Admitting: Internal Medicine

## 2014-02-13 NOTE — Progress Notes (Signed)
Faxed clinical information to [facility] @ [phone] claim # [id number]

## 2014-02-20 ENCOUNTER — Encounter: Payer: Self-pay | Admitting: Thoracic Surgery (Cardiothoracic Vascular Surgery)

## 2014-02-20 ENCOUNTER — Ambulatory Visit
Admission: RE | Admit: 2014-02-20 | Discharge: 2014-02-20 | Disposition: A | Discharge status: Home / Self Care | Payer: 59 | Source: Ambulatory Visit | Attending: Radiation Oncology | Admitting: Radiation Oncology

## 2014-02-20 ENCOUNTER — Ambulatory Visit (INDEPENDENT_AMBULATORY_CARE_PROVIDER_SITE_OTHER): Payer: 59 | Admitting: Thoracic Surgery (Cardiothoracic Vascular Surgery)

## 2014-02-20 ENCOUNTER — Other Ambulatory Visit: Payer: Self-pay | Admitting: Radiation Therapy

## 2014-02-20 ENCOUNTER — Ambulatory Visit: Payer: 59 | Attending: Internal Medicine | Admitting: Physical Therapy

## 2014-02-20 ENCOUNTER — Encounter: Payer: Self-pay | Admitting: Radiation Oncology

## 2014-02-20 ENCOUNTER — Encounter: Payer: Self-pay | Admitting: *Deleted

## 2014-02-20 VITALS — BP 109/50 | HR 70 | Temp 98.0°F | Wt 133.3 lb

## 2014-02-20 VITALS — BP 104/62 | HR 71 | Temp 98.0°F | Resp 18 | Wt 133.6 lb

## 2014-02-20 DIAGNOSIS — C7949 Secondary malignant neoplasm of other parts of nervous system: Principal | ICD-10-CM

## 2014-02-20 DIAGNOSIS — C7931 Secondary malignant neoplasm of brain: Secondary | ICD-10-CM

## 2014-02-20 DIAGNOSIS — C3490 Malignant neoplasm of unspecified part of unspecified bronchus or lung: Secondary | ICD-10-CM | POA: Insufficient documentation

## 2014-02-20 DIAGNOSIS — M7989 Other specified soft tissue disorders: Secondary | ICD-10-CM | POA: Insufficient documentation

## 2014-02-20 DIAGNOSIS — IMO0001 Reserved for inherently not codable concepts without codable children: Secondary | ICD-10-CM | POA: Insufficient documentation

## 2014-02-20 DIAGNOSIS — R911 Solitary pulmonary nodule: Secondary | ICD-10-CM

## 2014-02-20 NOTE — Progress Notes (Signed)
Patient for routine one month follow up completion of radiation to brain on 01/16/14.Denies pain or nausea.states she has intermittent headache but all right now.Completed dexamethasone taper.Scheduled for Dr.Hendrickson today at 3:30 pm for consideration of removal of spot seen on lung to get definitive diagnosis.

## 2014-02-20 NOTE — Progress Notes (Signed)
Radiation Oncology         [phone] ________________________________  Name: [name] [name] MRN: [id number]  Date: 02/20/2014  DOB: 12-Oct-1961  Follow-Up Visit Note  CC: Orpah Melter, MD  Eustace Moore, MD  Diagnosis:   53 year old woman status post resection of a 17 mm right cerebellar BRAF positive brain tumor  Interval Since Last Radiation:  4  weeks  Narrative:  The patient returns today for routine follow-up.  Denies pain or nausea.states she has intermittent headache but all right now. Completed dexamethasone taper.Scheduled for Dr.Hendrickson today at 3:30 pm for consideration of removal of spot seen on lung to get definitive diagnosis                             ALLERGIES:  is allergic to morphine and related.  Meds: Current Outpatient Prescriptions  Medication Sig Dispense Refill  . levothyroxine (SYNTHROID, LEVOTHROID) 125 MCG tablet Take 125 mcg by mouth daily before breakfast.      . methocarbamol (ROBAXIN) 500 MG tablet Take 500 mg by mouth as needed for muscle spasms.      Marland Kitchen omeprazole (PRILOSEC) 10 MG capsule Take 10 mg by mouth daily.      Marland Kitchen oxyCODONE-acetaminophen (PERCOCET/ROXICET) 5-325 MG per tablet Take 2 tablets by mouth every 4 (four) hours as needed for severe pain.  20 tablet  0  . piroxicam (FELDENE) 20 MG capsule Take 20 mg by mouth daily.      . promethazine (PHENERGAN) 25 MG tablet Take 1 tablet (25 mg total) by mouth every 6 (six) hours as needed for nausea.  30 tablet  0   No current facility-administered medications for this encounter.    Physical Findings: The patient is in no acute distress. Patient is alert and oriented.  weight is 133 lb 4.8 oz (60.464 kg). Her temperature is 98 F (36.7 C). Her blood pressure is 109/50 and her pulse is 70. Her oxygen saturation is 100%. .Scalp incision is well healed.  Scalp epilation in field.  No significant changes.   Lab Findings: Lab Results  Component Value Date   WBC 7.7 02/10/2014   HGB  12.9 02/10/2014   HCT 38.8 02/10/2014   MCV 99.3 02/10/2014   PLT 293 02/10/2014    @LASTCHEM @  Radiographic Findings: Ct Abdomen Pelvis W Contrast  01/25/2014   CLINICAL DATA:  Right lower quadrant abdominal pain and nausea. History of melanoma with metastases.  EXAM: CT ABDOMEN AND PELVIS WITH CONTRAST  TECHNIQUE: Multidetector CT imaging of the abdomen and pelvis was performed using the standard protocol following bolus administration of intravenous contrast.  CONTRAST:  160m OMNIPAQUE IOHEXOL 300 MG/ML  SOLN  COMPARISON:  PET/CT performed 01/02/2014  FINDINGS: Mild right basilar nodular atelectasis or scarring is noted.  The liver and spleen are unremarkable in appearance. The gallbladder is within normal limits. The pancreas and adrenal glands are unremarkable.  There is moderate right-sided hydronephrosis, diffuse right-sided perinephric stranding and fluid, and prominence of the right ureter to the level of an obstructing 3 mm stone within the distal right ureter, 1 cm proximal to the right vesicoureteral junction. No nonobstructing renal stones are identified. Apparent areas of renal cortical thinning at the left kidney are thought to reflect scarring and small cysts; there is no definite evidence for pyelonephritis.  No free fluid is identified. The small bowel is unremarkable in appearance. The stomach is within normal limits. No acute vascular abnormalities  are seen. Relatively diffuse calcification is seen along the abdominal aorta and its branches.  The patient is status post appendectomy. The colon is unremarkable in appearance, aside from a single diverticulum noted along the mid sigmoid colon.  The bladder is relatively decompressed and grossly unremarkable in appearance. The uterus is within normal limits. The ovaries are relatively symmetric; no suspicious adnexal masses are seen. No inguinal lymphadenopathy is seen.  No acute osseous abnormalities are identified.  IMPRESSION: 1. Moderate  right-sided hydronephrosis, with diffuse right-sided perinephric stranding and fluid, and an obstructing 3 mm stone noted distally within the right ureter, 1 cm proximal to the right vesicoureteral junction. 2. Areas of renal cortical thinning at the left kidney are thought to reflect scarring and small cysts; no evidence for pyelonephritis. 3. Relatively diffuse calcification along the abdominal aorta and its branches. 4. Mild right basilar nodular atelectasis or scarring noted.   Electronically Signed   By: Garald Balding M.D.   On: 01/25/2014 06:28    Impression:  The patient is recovering from the effects of radiation.  Plan:  Brain MRI with SRS protocol in 3 months then follow-up.  _____________________________________  Sheral Apley. Tammi Klippel, M.D.

## 2014-02-20 NOTE — Progress Notes (Signed)
PCP is Orpah Melter, MD Referring Provider is Orpah Melter, MD  No chief complaint on file.   HPI: 53 yo woman with a history of tobacco use and recent craniotomy for a tumor of unknown origin. Workup included a CT of the chest which showed a 5 mm nodule in the left lower lobe. A PET Ct showed the LLL nodule was hypermetabolic with an SUV of 1.9, which is significantly high for a nodule that small.  Her brain tumor was resected in December. That was complicated by a staph infection of her craniotomy. Pathology showed a malignancy. She received radiation but not chemo because the cell type of the tumor has not been definitively determined. The pathology has been sent out to several academic centers but they also have failed to ID it.  She smoked up to a pack of cigarettes a day until the time of her surgery in December. She does not have any significant cough, wheezing or shortness of breath. Her weight increased when she was on steroids. She denies fevers, chills or sweats.  Past Medical History  Diagnosis Date  . TMJ (dislocation of temporomandibular joint)   . Thyroid disease   . Lung cancer     awaiting pathology  . Brain cancer     awaiting pathology; assuming this is a lung primary with brain mets    Past Surgical History  Procedure Laterality Date  . Tonsillectomy    . Appendectomy    . Suboccipital craniectomy cervical laminectomy N/A 11/19/2013    Procedure: SUBOCCIPITAL CRANIECTOMY for tumor;  Surgeon: Eustace Moore, MD;  Location: [facility];  Service: Neurosurgery;  Laterality: N/A;  . Lumbar wound debridement N/A 12/05/2013    Procedure: irrigation and debridement posterior cervical wound;  Surgeon: Eustace Moore, MD;  Location: [facility];  Service: Neurosurgery;  Laterality: N/A;    Family History  Problem Relation Age of Onset  . Dementia Mother     Social History History  Substance Use Topics  . Smoking status: Former Smoker    Quit date: 11/19/2013   . Smokeless tobacco: Never Used  . Alcohol Use: No    Current Outpatient Prescriptions  Medication Sig Dispense Refill  . levothyroxine (SYNTHROID, LEVOTHROID) 125 MCG tablet Take 125 mcg by mouth daily before breakfast.      . methocarbamol (ROBAXIN) 500 MG tablet Take 500 mg by mouth as needed for muscle spasms.      Marland Kitchen omeprazole (PRILOSEC) 10 MG capsule Take 10 mg by mouth daily.      Marland Kitchen oxyCODONE-acetaminophen (PERCOCET/ROXICET) 5-325 MG per tablet Take 2 tablets by mouth every 4 (four) hours as needed for severe pain.  20 tablet  0  . piroxicam (FELDENE) 20 MG capsule Take 20 mg by mouth daily.      . promethazine (PHENERGAN) 25 MG tablet Take 1 tablet (25 mg total) by mouth every 6 (six) hours as needed for nausea.  30 tablet  0   No current facility-administered medications for this visit.    Allergies  Allergen Reactions  . Morphine And Related Nausea Only    Review of Systems  Constitutional: Negative for fever and chills.  Respiratory: Negative for cough, shortness of breath and wheezing.   Cardiovascular: Negative for chest pain.  Gastrointestinal: Positive for nausea (prior to brain surgery).  All other systems reviewed and are negative.    BP 104/62  Pulse 71  Temp(Src) 98 F (36.7 C)  Resp 18  Wt 133 lb  9.6 oz (60.601 kg)  SpO2 100% Physical Exam  Vitals reviewed. Constitutional: She is oriented to person, place, and time. She appears well-developed and well-nourished.  HENT:  Healing scar midline posteriorly  Eyes: EOM are normal. Pupils are equal, round, and reactive to light.  Neck: Neck supple. No thyromegaly present.  Cardiovascular: Normal rate, regular rhythm and normal heart sounds.  Exam reveals no gallop and no friction rub.   No murmur heard. Pulmonary/Chest: Effort normal and breath sounds normal. She has no wheezes. She has no rales.  Abdominal: Soft. There is no tenderness.  Musculoskeletal: She exhibits no edema.  Lymphadenopathy:    She  has no cervical adenopathy.  Neurological: She is alert and oriented to person, place, and time. No cranial nerve deficit.  Skin: Skin is warm and dry.     Diagnostic Tests: CT CHEST, ABDOMEN, AND PELVIS WITH CONTRAST  TECHNIQUE:  Multidetector CT imaging of the chest, abdomen and pelvis was  performed following the standard protocol during bolus  administration of intravenous contrast.  CONTRAST: 6mL OMNIPAQUE IOHEXOL 300 MG/ML SOLN  COMPARISON: Brain MRI 11/14/2013  FINDINGS:  CT CHEST FINDINGS  No axillary or supraclavicular lymphadenopathy. No mediastinal hilar  lymphadenopathy. No pericardial fluid. No central pulmonary  embolism. The thyroid gland is diminutive.  Review of the lung parenchyma demonstrates a smudgy nodule (sub  solid) measuring 6 mm in the posterior aspect of the superior  segment of the right lower lobe (image 22). There is a small  spiculated nodule measuring 5 mm in superior segment left lower lobe  (image 21). There is mild medial atelectasis the lung bases.  CT ABDOMEN AND PELVIS FINDINGS  No focal hepatic lesion. Gallbladder, pancreas, spleen, adrenal  glands, and kidneys are normal. Small nonobstructing calculus in the  right kidney  The stomach, small bowel, and cecum normal. The colon rectosigmoid  colon normal without evidence of mass lesion or obstruction.  The abdominal aorta is normal caliber. There is no retroperitoneal  retrocrural adenopathy. No mesenteric periportal adenopathy.  No free fluid the pelvis. The uterus and bladder normal. Ovaries are  normal. No aggressive osseous lesion. No pelvic lymphadenopathy.  IMPRESSION:  1. Small spiculated nodule in the superior segment of the left lower  lobe in the setting of the intracranial metastasis is concerning for  metastatic lesion versus less likely primary lesion.  2. Sub solid nodule in the right lower lobe is nonspecific.  3. No evidence of primary lesion in the chest abdomen or pelvis.   Electronically Signed  By: Suzy Bouchard M.D.  On: 11/15/2013 14:29   NUCLEAR MEDICINE PET SKULL BASE TO THIGH  FASTING BLOOD GLUCOSE: Value: 77mg /dl  TECHNIQUE:  7.6 mCi F-18 FDG was injected intravenously. CT data was obtained  and used for attenuation correction and anatomic localization only.  (This was not acquired as a diagnostic CT examination.) Additional  exam technical data entered on technologist worksheet.  COMPARISON: CT CHEST W/CM dated 11/15/2013  FINDINGS:  NECK  No hypermetabolic lymph nodes in the neck.  CHEST  5 x 4 mm nodule in the superior segment left lower lobe is  hypermetabolic. Although the maximum standard uptake value is only  1.9, this is still disproportionate to the very small size of this  lesion. Previously seen ground-glass nodule along the right major  fissure is not well seen although there is surrounding dependent  subsegmental atelectasis. Mild lingular and right middle lobe  subsegmental atelectasis observed.  ABDOMEN/PELVIS  Bowel activity and anal  activity, likely physiologic. Fast no  findings suspicious for malignancy in the abdomen or pelvis.  Aortoiliac atherosclerotic vascular disease. 2 mm right mid kidney  nonobstructive calculus. 1 mm left kidney lower pole nonobstructive  calculus. Nonspecific 6 mm hyperdense lesion in the left mid kidney  laterally, probably a complex cyst but technically nonspecific.  There is a borderline dilated loop of proximal jejunum.  SKELETON  No focal hypermetabolic activity to suggest skeletal metastasis.  IMPRESSION:  1. The 5 x 4 mm nodule in the superior segment left lower lobe is  hypermetabolic, concerning for malignancy. Active granulomatous  process is a less likely differential diagnostic consideration.  2. Bilateral nonobstructive nephrolithiasis.  3. 6 mm hyperdense lesion in the left mid kidney, probably a complex  cysts but technically nonspecific.  Electronically Signed  By: Sherryl Barters M.D.  On: 01/02/2014 15:17  Impression: 53 yo woman with a 5 mm nodule in the superior segment of the left lower lobe. The nodule is hypermetabolic on PET which makes it very suspicious for malignancy. Normally a nodule of this size would be followed, but the other factor to consider is her recent brain tumor that has not thus far been able to be characterized and could be a metastasis from the lung.  I recommended that we do a left VATS and wedge resection of the LLL nodule for both diagnostic and therapeutic purposes. It will not only be therapeutic in terms of the nodule but also potentially may help elucidate the origin of the brain lesion, which could allow for appropriate systemic therapy to be given.  I discussed with Mrs. Sloss and her husband the general nature of the procedure, the need for general anesthesia, and the incisions to be used. We discussed the expected hospital stay, overall recovery and short and long term outcomes. They understand the risks of surgery include, but are not limited to death, stroke, MI, DVT/PE, bleeding, possible need for transfusion, infections, air leaks, and other organ system dysfunction including respiratory, renal, or GI complications.   She accepts the risks and agrees to proceed  Plan:  Plan Left VATS, wedge resection

## 2014-02-20 NOTE — Progress Notes (Signed)
[facility] Clinical Social Work  Clinical Social Work met with patient/family at [facility] appointment to offer support and assess for psychosocial needs.  The patient is hopeful for surgical option and discussed meeting with multiple providers at [facility] and other centers.  She discussed difficulty obtaining short-term disability and CSW advised patient on process.  Clinical Social Work briefly discussed Clinical Social Work role and [facility] support programs/services.  Clinical Social Work encouraged patient to call with any additional questions or concerns.   Lauren Mullis, MSW, LCSW, OSW-C Clinical Social Worker [facility] [phone]  

## 2014-02-21 ENCOUNTER — Encounter (HOSPITAL_COMMUNITY): Payer: Self-pay | Admitting: Emergency Medicine

## 2014-02-21 ENCOUNTER — Other Ambulatory Visit: Payer: Self-pay

## 2014-02-21 ENCOUNTER — Emergency Department (HOSPITAL_COMMUNITY)
Admission: EM | Admit: 2014-02-21 | Discharge: 2014-02-21 | Disposition: A | Discharge status: Home / Self Care | Payer: 59 | Attending: Emergency Medicine | Admitting: Emergency Medicine

## 2014-02-21 ENCOUNTER — Telehealth: Payer: Self-pay | Admitting: Medical Oncology

## 2014-02-21 DIAGNOSIS — D381 Neoplasm of uncertain behavior of trachea, bronchus and lung: Secondary | ICD-10-CM

## 2014-02-21 DIAGNOSIS — Z87828 Personal history of other (healed) physical injury and trauma: Secondary | ICD-10-CM | POA: Insufficient documentation

## 2014-02-21 DIAGNOSIS — E079 Disorder of thyroid, unspecified: Secondary | ICD-10-CM | POA: Insufficient documentation

## 2014-02-21 DIAGNOSIS — Z85118 Personal history of other malignant neoplasm of bronchus and lung: Secondary | ICD-10-CM | POA: Insufficient documentation

## 2014-02-21 DIAGNOSIS — Z79899 Other long term (current) drug therapy: Secondary | ICD-10-CM | POA: Insufficient documentation

## 2014-02-21 DIAGNOSIS — Z923 Personal history of irradiation: Secondary | ICD-10-CM | POA: Insufficient documentation

## 2014-02-21 DIAGNOSIS — K219 Gastro-esophageal reflux disease without esophagitis: Secondary | ICD-10-CM | POA: Insufficient documentation

## 2014-02-21 DIAGNOSIS — Z87891 Personal history of nicotine dependence: Secondary | ICD-10-CM | POA: Insufficient documentation

## 2014-02-21 DIAGNOSIS — R1313 Dysphagia, pharyngeal phase: Secondary | ICD-10-CM

## 2014-02-21 DIAGNOSIS — J029 Acute pharyngitis, unspecified: Secondary | ICD-10-CM | POA: Insufficient documentation

## 2014-02-21 DIAGNOSIS — Z791 Long term (current) use of non-steroidal anti-inflammatories (NSAID): Secondary | ICD-10-CM | POA: Insufficient documentation

## 2014-02-21 DIAGNOSIS — Z85841 Personal history of malignant neoplasm of brain: Secondary | ICD-10-CM | POA: Insufficient documentation

## 2014-02-21 MED ORDER — PANTOPRAZOLE SODIUM 40 MG PO TBEC: 40.0000 mg | DELAYED_RELEASE_TABLET | Freq: Every day | ORAL | Status: DC

## 2014-02-21 NOTE — Telephone Encounter (Signed)
Pt reports since yesterday she is having trouble swallowing. She was at [facility] [employer] and could not swallow food. She was able to drink soup. She also reports her voice is now hoarse. She is not having any trouble breathing.I instructed her to go to ED or urgent care. She said she will call her PCP first .

## 2014-02-21 NOTE — ED Notes (Signed)
Dr. Ghim at bedside. 

## 2014-02-21 NOTE — ED Notes (Signed)
Pt reports that for the past couple of days she has had difficulty swallowing. Reports that she is able to swallow liquid but having trouble with solids. Pt reports that she undergoing treatment for brain cancer. No neuro deficits at triage

## 2014-02-21 NOTE — ED Notes (Signed)
Patient has been experiencing for 3 days trouble swallowing, no drooling noted, no trouble handling secretions. airway is patent, no stridor noted upon auscultation.  Patient feels like food is getting stuck halfway down.  Food is eventually passing down but it takes longer than usual.  Patient has been receiving radiation therapy for a brain tumor, also has lung cancer.

## 2014-02-21 NOTE — Discharge Instructions (Signed)
Dysphagia  Swallowing problems (dysphagia) occur when solids and liquids seem to stick in your throat on the way down to your stomach, or the food takes longer to get to the stomach. Other symptoms include regurgitating food, noises coming from the throat, chest discomfort with swallowing, and a feeling of fullness or the feeling of something being stuck in your throat when swallowing. When blockage in your throat is complete it may be associated with drooling.  CAUSES   Problems with swallowing may occur because of problems with the muscles. The food cannot be propelled in the usual manner into your stomach. You may have ulcers, scar tissue, or inflammation in the tube down which food travels from your mouth to your stomach (esophagus), which blocks food from passing normally into the stomach. Causes of inflammation include:  · Acid reflux from your stomach into your esophagus.  · Infection.  · Radiation treatment for cancer.  · Medicines taken without enough fluids to wash them down into your stomach.  You may have nerve problems that prevent signals from being sent to the muscles of your esophagus to contract and move your food down to your stomach. Globus pharyngeus is a relatively common problem in which there is a sense of an obstruction or difficulty in swallowing, without any physical abnormalities of the swallowing passages being found. This problem usually improves over time with reassurance and testing to rule out other causes.  DIAGNOSIS  Dysphagia can be diagnosed and its cause can be determined by tests in which you swallow a white substance that helps illuminate the inside of your throat (contrast medium) while X-rays are taken. Sometimes a flexible telescope that is inserted down your throat (endoscopy) to look at your esophagus and stomach is used.  TREATMENT   · If the dysphagia is caused by acid reflux or infection, medicines may be used.  · If the dysphagia is caused by problems with your  swallowing muscles, swallowing therapy may be used to help you strengthen your swallowing muscles.  · If the dysphagia is caused by a blockage or mass, procedures to remove the blockage may be done.  HOME CARE INSTRUCTIONS  · Try to eat soft food that is easier to swallow and check your weight on a daily basis to be sure that it is not decreasing.  · Be sure to drink liquids when sitting upright (not lying down).  SEEK MEDICAL CARE IF:  · You are losing weight because you are unable to swallow.  · You are coughing when you drink liquids (aspiration).  · You are coughing up partially digested food.  SEEK IMMEDIATE MEDICAL CARE IF:  · You are unable to swallow your own saliva .  · You are having shortness of breath or a fever, or both.  · You have a hoarse voice along with difficulty swallowing.  MAKE SURE YOU:  · Understand these instructions.  · Will watch your condition.  · Will get help right away if you are not doing well or get worse.  Document Released: 11/11/2000 Document Revised: 07/17/2013 Document Reviewed: 05/03/2013  [employer]® Patient Information ©2014 [employer].

## 2014-02-21 NOTE — ED Provider Notes (Signed)
CSN: [id number]     Arrival date & time 02/21/14  1640 History   First MD Initiated Contact with Patient 02/21/14 1704     Chief Complaint  Patient presents with  . Dysphagia     (Consider location/radiation/quality/duration/timing/severity/associated sxs/prior Treatment) HPI Comments: Pt with h/o radiation therapy to posterior brain due to tumor, last session about 4 weeks ago. She has had worsening GERD symptoms in the past month as well, has tried omeprazole with mild improvement for 2 weeks, has a propped up bed for GERD symptoms.  She developed a mild sore throat sensation since yesterday, but was mild initially.  Today, symptoms gradually worsened and noted some difficulty swallowing due to pain on right side of throat.  While eating lunch, had difficulty swallowing soup and so called PCP who directed her to the ED, anticipating to possibly see ENT.  Pt denies fever, chills, nausea.  She could feel soup going down, seemed to stick, pain on right side, but food went down normally towards left.  No difficulty with secretions, no SOB. No cp.   The history is provided by the spouse and the patient.    Past Medical History  Diagnosis Date  . TMJ (dislocation of temporomandibular joint)   . Thyroid disease   . Lung cancer     awaiting pathology  . Brain cancer     awaiting pathology; assuming this is a lung primary with brain mets   Past Surgical History  Procedure Laterality Date  . Tonsillectomy    . Appendectomy    . Suboccipital craniectomy cervical laminectomy N/A 11/19/2013    Procedure: SUBOCCIPITAL CRANIECTOMY for tumor;  Surgeon: Eustace Moore, MD;  Location: [facility];  Service: Neurosurgery;  Laterality: N/A;  . Lumbar wound debridement N/A 12/05/2013    Procedure: irrigation and debridement posterior cervical wound;  Surgeon: Eustace Moore, MD;  Location: [facility];  Service: Neurosurgery;  Laterality: N/A;   Family History  Problem Relation Age of Onset  . Dementia  Mother    History  Substance Use Topics  . Smoking status: Former Smoker    Quit date: 11/19/2013  . Smokeless tobacco: Never Used  . Alcohol Use: No   OB History   Grav Para Term Preterm Abortions TAB SAB Ect Mult Living                 Review of Systems  Constitutional: Negative for fever.  HENT: Positive for sore throat and trouble swallowing. Negative for congestion, postnasal drip, rhinorrhea and voice change.   Respiratory: Negative for shortness of breath.   Cardiovascular: Negative for chest pain.  Gastrointestinal: Negative for nausea and abdominal pain.  All other systems reviewed and are negative.      Allergies  Morphine and related  Home Medications   Current Outpatient Rx  Name  Route  Sig  Dispense  Refill  . levothyroxine (SYNTHROID, LEVOTHROID) 125 MCG tablet   Oral   Take 125 mcg by mouth daily before breakfast.         . methocarbamol (ROBAXIN) 500 MG tablet   Oral   Take 500 mg by mouth as needed for muscle spasms.         Marland Kitchen omeprazole (PRILOSEC) 10 MG capsule   Oral   Take 10 mg by mouth daily.         . piroxicam (FELDENE) 20 MG capsule   Oral   Take 20 mg by mouth daily.         Marland Kitchen  pantoprazole (PROTONIX) 40 MG tablet   Oral   Take 1 tablet (40 mg total) by mouth daily.   14 tablet   0    BP 98/68  Pulse 73  Temp(Src) 98 F (36.7 C) (Oral)  Resp 20  SpO2 100% Physical Exam  Nursing note and vitals reviewed. Constitutional: She is oriented to person, place, and time. She appears well-developed and well-nourished. She is cooperative.  Non-toxic appearance. She does not have a sickly appearance. She does not appear ill. No distress.  HENT:  Head: Normocephalic and atraumatic.  Mouth/Throat: Uvula is midline, oropharynx is clear and moist and mucous membranes are normal.  Eyes: Conjunctivae and EOM are normal. No scleral icterus.  Neck: Normal range of motion and phonation normal. Neck supple. No JVD present. No tracheal  deviation present. No thyromegaly present.  Cardiovascular: Normal rate, regular rhythm and intact distal pulses.   Pulmonary/Chest: Effort normal. No stridor. No respiratory distress. She has no wheezes. She has no rales.  Abdominal: Soft. She exhibits no distension. There is no tenderness.  Lymphadenopathy:    She has no cervical adenopathy.  Neurological: She is alert and oriented to person, place, and time.  Skin: Skin is warm. She is not diaphoretic.  Psychiatric: She has a normal mood and affect. Her behavior is normal. Judgment normal. Her speech is rapid and/or pressured. Cognition and memory are normal.    ED Course  Procedures (including critical care time) Labs Review Labs Reviewed - No data to display Imaging Review No results found.   EKG Interpretation None      MDM   Final diagnoses:  Dysphagia, pharyngeal phase    No fever, not toxic, airway cvlear, no stridor.  Oropharynx clear.  Pt can handle secretions.  Discussed with Dr. Watt Climes who will see pt in clinic on Monday, rx for ppi given.  Return precautions discussed.     Saddie Benders. Dorna Mai, MD 02/22/14 [id number]

## 2014-02-24 ENCOUNTER — Encounter (HOSPITAL_COMMUNITY): Payer: Self-pay

## 2014-02-24 ENCOUNTER — Other Ambulatory Visit: Payer: Self-pay | Admitting: Gastroenterology

## 2014-02-24 DIAGNOSIS — R1310 Dysphagia, unspecified: Secondary | ICD-10-CM

## 2014-02-25 ENCOUNTER — Ambulatory Visit
Admission: RE | Admit: 2014-02-25 | Discharge: 2014-02-25 | Disposition: A | Discharge status: Home / Self Care | Payer: 59 | Source: Ambulatory Visit | Attending: Gastroenterology | Admitting: Gastroenterology

## 2014-02-25 DIAGNOSIS — R1310 Dysphagia, unspecified: Secondary | ICD-10-CM

## 2014-03-03 ENCOUNTER — Encounter (HOSPITAL_COMMUNITY)
Admission: RE | Admit: 2014-03-03 | Discharge: 2014-03-03 | Disposition: A | Discharge status: Home / Self Care | Payer: 59 | Source: Ambulatory Visit | Attending: Thoracic Surgery (Cardiothoracic Vascular Surgery) | Admitting: Thoracic Surgery (Cardiothoracic Vascular Surgery)

## 2014-03-03 ENCOUNTER — Encounter (HOSPITAL_COMMUNITY): Payer: Self-pay

## 2014-03-03 VITALS — BP 127/83 | HR 98 | Temp 97.6°F | Resp 20 | Ht 67.0 in | Wt 126.5 lb

## 2014-03-03 DIAGNOSIS — D381 Neoplasm of uncertain behavior of trachea, bronchus and lung: Secondary | ICD-10-CM

## 2014-03-03 HISTORY — DX: Anxiety disorder, unspecified: F41.9

## 2014-03-03 HISTORY — DX: Gastro-esophageal reflux disease without esophagitis: K21.9

## 2014-03-03 HISTORY — DX: Hypothyroidism, unspecified: E03.9

## 2014-03-03 HISTORY — DX: Unspecified dementia, unspecified severity, without behavioral disturbance, psychotic disturbance, mood disturbance, and anxiety: F03.90

## 2014-03-03 LAB — CBC
HCT: 40.1 % (ref 36.0–46.0)
HEMOGLOBIN: 13.9 g/dL (ref 12.0–15.0)
MCH: 32.5 pg (ref 26.0–34.0)
MCHC: 34.7 g/dL (ref 30.0–36.0)
MCV: 93.7 fL (ref 78.0–100.0)
Platelets: 335 10*3/uL (ref 150–400)
RBC: 4.28 MIL/uL (ref 3.87–5.11)
RDW: 13.4 % (ref 11.5–15.5)
WBC: 5.9 10*3/uL (ref 4.0–10.5)

## 2014-03-03 LAB — BLOOD GAS, ARTERIAL
Acid-base deficit: 0.5 mmol/L (ref 0.0–2.0)
Bicarbonate: 22.9 mEq/L (ref 20.0–24.0)
Drawn by: 181601
FIO2: 0.21 %
O2 SAT: 97.7 %
PO2 ART: 94.5 mmHg (ref 80.0–100.0)
Patient temperature: 98.6
TCO2: 23.9 mmol/L (ref 0–100)
pCO2 arterial: 32.6 mmHg — ABNORMAL LOW (ref 35.0–45.0)
pH, Arterial: 7.461 — ABNORMAL HIGH (ref 7.350–7.450)

## 2014-03-03 LAB — URINALYSIS, ROUTINE W REFLEX MICROSCOPIC
GLUCOSE, UA: NEGATIVE mg/dL
HGB URINE DIPSTICK: NEGATIVE
Ketones, ur: 15 mg/dL — AB
Nitrite: NEGATIVE
Protein, ur: NEGATIVE mg/dL
SPECIFIC GRAVITY, URINE: 1.029 (ref 1.005–1.030)
UROBILINOGEN UA: 0.2 mg/dL (ref 0.0–1.0)
pH: 5.5 (ref 5.0–8.0)

## 2014-03-03 LAB — COMPREHENSIVE METABOLIC PANEL
ALK PHOS: 92 U/L (ref 39–117)
ALT: 12 U/L (ref 0–35)
AST: 17 U/L (ref 0–37)
Albumin: 3.8 g/dL (ref 3.5–5.2)
BUN: 14 mg/dL (ref 6–23)
CHLORIDE: 103 meq/L (ref 96–112)
CO2: 20 meq/L (ref 19–32)
Calcium: 9.3 mg/dL (ref 8.4–10.5)
Creatinine, Ser: 0.61 mg/dL (ref 0.50–1.10)
GLUCOSE: 103 mg/dL — AB (ref 70–99)
POTASSIUM: 3.3 meq/L — AB (ref 3.7–5.3)
SODIUM: 140 meq/L (ref 137–147)
Total Bilirubin: 0.5 mg/dL (ref 0.3–1.2)
Total Protein: 6.7 g/dL (ref 6.0–8.3)

## 2014-03-03 LAB — SURGICAL PCR SCREEN
MRSA, PCR: NEGATIVE
STAPHYLOCOCCUS AUREUS: NEGATIVE

## 2014-03-03 LAB — TYPE AND SCREEN
ABO/RH(D): O POS
Antibody Screen: NEGATIVE

## 2014-03-03 LAB — URINE MICROSCOPIC-ADD ON

## 2014-03-03 LAB — PROTIME-INR
INR: 1.14 (ref 0.00–1.49)
Prothrombin Time: 14.4 seconds (ref 11.6–15.2)

## 2014-03-03 LAB — APTT: aPTT: 32 seconds (ref 24–37)

## 2014-03-03 NOTE — Pre-Procedure Instructions (Signed)
DIAMONIQUE RUEDAS  03/03/2014   Your procedure is scheduled on:  03/05/14  Report to [place]  2 * 3 at 630 AM.  Call this number if you have problems the morning of surgery: [phone]   Remember:   Do not eat food or drink liquids after midnight.   Take these medicines the morning of surgery with A SIP OF WATER: synthroid,prilosec,protonix   Do not wear jewelry, make-up or nail polish.  Do not wear lotions, powders, or perfumes. You may wear deodorant.  Do not shave 48 hours prior to surgery. Men may shave face and neck.  Do not bring valuables to the hospital.  [facility] is not responsible                  for any belongings or valuables.               Contacts, dentures or bridgework may not be worn into surgery.  Leave suitcase in the car. After surgery it may be brought to your room.  For patients admitted to the hospital, discharge time is determined by your                treatment team.               Patients discharged the day of surgery will not be allowed to drive  home.  Name and phone number of your driver: family  Special Instructions: Shower using CHG 2 nights before surgery and the night before surgery.  If you shower the day of surgery use CHG.  Use special wash - you have one bottle of CHG for all showers.  You should use approximately 1/3 of the bottle for each shower.   Please read over the following fact sheets that you were given: Pain Booklet, Coughing and Deep Breathing, Blood Transfusion Information, MRSA Information and Surgical Site Infection Prevention

## 2014-03-04 MED ORDER — DEXTROSE 5 % IV SOLN
1.5000 g | INTRAVENOUS | Status: AC
  Administered 2014-03-05: 1.5 g via INTRAVENOUS
  Filled 2014-03-04: qty 1.5

## 2014-03-04 NOTE — Progress Notes (Signed)
Anesthesia Chart Review: Patient is a 53 year old female scheduled for left VATS, wedge resection for evaluation of a LUL lung nodule with recent diagnosis of brain cancer 10/2013.  History includes former smoker, brain cancer s/p right suboccipital craniectomy for right cerebellar mass 32/12/24 complicated by staph infection, GERD, TMJ, dementia, anxiety, hypothyroidism. PCP is listed as Dr. Orpah Melter.  Preoperative EKG, CXR, and labs noted.  Anticipate that she can proceed as planned.  George Hugh [facility] Short Stay Center/Anesthesiology Phone [phone] 03/04/2014 11:31 AM

## 2014-03-05 ENCOUNTER — Encounter (HOSPITAL_COMMUNITY)
Admission: RE | Disposition: A | Discharge status: Home / Self Care | Payer: Self-pay | Source: Ambulatory Visit | Attending: Thoracic Surgery (Cardiothoracic Vascular Surgery)

## 2014-03-05 ENCOUNTER — Encounter (HOSPITAL_COMMUNITY): Payer: 59 | Admitting: Vascular Surgery

## 2014-03-05 ENCOUNTER — Inpatient Hospital Stay (HOSPITAL_COMMUNITY): Payer: 59

## 2014-03-05 ENCOUNTER — Encounter (HOSPITAL_COMMUNITY): Payer: Self-pay | Admitting: *Deleted

## 2014-03-05 ENCOUNTER — Inpatient Hospital Stay (HOSPITAL_COMMUNITY)
Admission: RE | Admit: 2014-03-05 | Discharge: 2014-03-10 | DRG: 164 | Disposition: A | Discharge status: Home / Self Care | Payer: 59 | Source: Ambulatory Visit | Attending: Thoracic Surgery (Cardiothoracic Vascular Surgery) | Admitting: Thoracic Surgery (Cardiothoracic Vascular Surgery)

## 2014-03-05 ENCOUNTER — Ambulatory Visit (HOSPITAL_COMMUNITY): Payer: 59 | Admitting: Anesthesiology

## 2014-03-05 DIAGNOSIS — I998 Other disorder of circulatory system: Secondary | ICD-10-CM | POA: Diagnosis not present

## 2014-03-05 DIAGNOSIS — Y849 Medical procedure, unspecified as the cause of abnormal reaction of the patient, or of later complication, without mention of misadventure at the time of the procedure: Secondary | ICD-10-CM | POA: Diagnosis not present

## 2014-03-05 DIAGNOSIS — E876 Hypokalemia: Secondary | ICD-10-CM | POA: Diagnosis present

## 2014-03-05 DIAGNOSIS — D381 Neoplasm of uncertain behavior of trachea, bronchus and lung: Secondary | ICD-10-CM

## 2014-03-05 DIAGNOSIS — K219 Gastro-esophageal reflux disease without esophagitis: Secondary | ICD-10-CM | POA: Diagnosis present

## 2014-03-05 DIAGNOSIS — R911 Solitary pulmonary nodule: Secondary | ICD-10-CM | POA: Diagnosis present

## 2014-03-05 DIAGNOSIS — I808 Phlebitis and thrombophlebitis of other sites: Secondary | ICD-10-CM | POA: Diagnosis not present

## 2014-03-05 DIAGNOSIS — Z01818 Encounter for other preprocedural examination: Secondary | ICD-10-CM

## 2014-03-05 DIAGNOSIS — Y9210 Unspecified residential institution as the place of occurrence of the external cause: Secondary | ICD-10-CM | POA: Diagnosis not present

## 2014-03-05 DIAGNOSIS — C3430 Malignant neoplasm of lower lobe, unspecified bronchus or lung: Principal | ICD-10-CM | POA: Diagnosis present

## 2014-03-05 DIAGNOSIS — Z923 Personal history of irradiation: Secondary | ICD-10-CM

## 2014-03-05 DIAGNOSIS — Z79899 Other long term (current) drug therapy: Secondary | ICD-10-CM

## 2014-03-05 DIAGNOSIS — Z01812 Encounter for preprocedural laboratory examination: Secondary | ICD-10-CM

## 2014-03-05 DIAGNOSIS — Z01810 Encounter for preprocedural cardiovascular examination: Secondary | ICD-10-CM

## 2014-03-05 DIAGNOSIS — Z87891 Personal history of nicotine dependence: Secondary | ICD-10-CM

## 2014-03-05 DIAGNOSIS — E039 Hypothyroidism, unspecified: Secondary | ICD-10-CM | POA: Diagnosis present

## 2014-03-05 HISTORY — PX: VIDEO ASSISTED THORACOSCOPY (VATS)/WEDGE RESECTION: SHX6174

## 2014-03-05 SURGERY — VIDEO ASSISTED THORACOSCOPY (VATS)/WEDGE RESECTION
Anesthesia: General | Site: Chest | Laterality: Left

## 2014-03-05 MED ORDER — BISACODYL 5 MG PO TBEC
10.0000 mg | DELAYED_RELEASE_TABLET | Freq: Every day | ORAL | Status: DC
  Administered 2014-03-09 – 2014-03-10 (×2): 10 mg via ORAL
  Filled 2014-03-05 (×7): qty 2

## 2014-03-05 MED ORDER — DIPHENHYDRAMINE HCL 50 MG/ML IJ SOLN
12.5000 mg | Freq: Four times a day (QID) | INTRAMUSCULAR | Status: DC | PRN
  Administered 2014-03-06: 12.5 mg via INTRAVENOUS
  Filled 2014-03-05: qty 1

## 2014-03-05 MED ORDER — DIPHENHYDRAMINE HCL 12.5 MG/5ML PO ELIX
12.5000 mg | ORAL_SOLUTION | Freq: Four times a day (QID) | ORAL | Status: DC | PRN
  Filled 2014-03-05: qty 5

## 2014-03-05 MED ORDER — ROCURONIUM BROMIDE 50 MG/5ML IV SOLN
INTRAVENOUS | Status: AC
  Filled 2014-03-05: qty 1

## 2014-03-05 MED ORDER — LACTATED RINGERS IV SOLN
INTRAVENOUS | Status: DC | PRN
  Administered 2014-03-05: 08:00:00 via INTRAVENOUS

## 2014-03-05 MED ORDER — OXYCODONE HCL 5 MG/5ML PO SOLN
ORAL | Status: AC
  Administered 2014-03-05: 10 mg
  Filled 2014-03-05: qty 10

## 2014-03-05 MED ORDER — POTASSIUM CHLORIDE 10 MEQ/50ML IV SOLN
10.0000 meq | Freq: Every day | INTRAVENOUS | Status: DC | PRN
  Administered 2014-03-06 – 2014-03-08 (×9): 10 meq via INTRAVENOUS
  Filled 2014-03-05 (×3): qty 50

## 2014-03-05 MED ORDER — FENTANYL CITRATE 0.05 MG/ML IJ SOLN
INTRAMUSCULAR | Status: DC | PRN
  Administered 2014-03-05: 50 ug via INTRAVENOUS
  Administered 2014-03-05 (×2): 100 ug via INTRAVENOUS
  Administered 2014-03-05 (×3): 50 ug via INTRAVENOUS
  Administered 2014-03-05 (×2): 100 ug via INTRAVENOUS

## 2014-03-05 MED ORDER — HYDROMORPHONE HCL PF 1 MG/ML IJ SOLN
INTRAMUSCULAR | Status: AC
  Administered 2014-03-05: 0.5 mg via INTRAVENOUS
  Filled 2014-03-05: qty 2

## 2014-03-05 MED ORDER — SUCRALFATE 1 G PO TABS
1.0000 g | ORAL_TABLET | Freq: Four times a day (QID) | ORAL | Status: DC
  Administered 2014-03-06 (×3): 1 g via ORAL
  Filled 2014-03-05 (×9): qty 1

## 2014-03-05 MED ORDER — ONDANSETRON HCL 4 MG/2ML IJ SOLN
INTRAMUSCULAR | Status: DC | PRN
  Administered 2014-03-05: 4 mg via INTRAVENOUS

## 2014-03-05 MED ORDER — FENTANYL CITRATE 0.05 MG/ML IJ SOLN
INTRAMUSCULAR | Status: AC
  Filled 2014-03-05: qty 2

## 2014-03-05 MED ORDER — KETOROLAC TROMETHAMINE 30 MG/ML IJ SOLN
INTRAMUSCULAR | Status: AC
  Filled 2014-03-05: qty 1

## 2014-03-05 MED ORDER — MIDAZOLAM HCL 2 MG/2ML IJ SOLN
INTRAMUSCULAR | Status: AC
  Filled 2014-03-05: qty 2

## 2014-03-05 MED ORDER — EPHEDRINE SULFATE 50 MG/ML IJ SOLN
INTRAMUSCULAR | Status: AC
  Filled 2014-03-05: qty 1

## 2014-03-05 MED ORDER — METHOCARBAMOL 500 MG PO TABS
500.0000 mg | ORAL_TABLET | ORAL | Status: DC | PRN
  Filled 2014-03-05 (×2): qty 1

## 2014-03-05 MED ORDER — MIDAZOLAM HCL 5 MG/5ML IJ SOLN
INTRAMUSCULAR | Status: DC | PRN
  Administered 2014-03-05 (×2): 2 mg via INTRAVENOUS

## 2014-03-05 MED ORDER — OXYCODONE-ACETAMINOPHEN 5-325 MG PO TABS: 1.0000 | ORAL_TABLET | ORAL | Status: DC | PRN

## 2014-03-05 MED ORDER — PHENYLEPHRINE HCL 10 MG/ML IJ SOLN
10.0000 mg | INTRAVENOUS | Status: DC | PRN
  Administered 2014-03-05: 10 ug/min via INTRAVENOUS

## 2014-03-05 MED ORDER — DEXAMETHASONE SODIUM PHOSPHATE 4 MG/ML IJ SOLN
INTRAMUSCULAR | Status: DC | PRN
  Administered 2014-03-05: 4 mg via INTRAVENOUS

## 2014-03-05 MED ORDER — OXYCODONE HCL 5 MG PO TABS
5.0000 mg | ORAL_TABLET | ORAL | Status: DC | PRN
  Administered 2014-03-05 – 2014-03-06 (×3): 10 mg via ORAL
  Filled 2014-03-05 (×3): qty 2

## 2014-03-05 MED ORDER — DEXAMETHASONE SODIUM PHOSPHATE 4 MG/ML IJ SOLN
INTRAMUSCULAR | Status: AC
  Filled 2014-03-05: qty 1

## 2014-03-05 MED ORDER — NALOXONE HCL 0.4 MG/ML IJ SOLN
0.4000 mg | INTRAMUSCULAR | Status: DC | PRN
Start: 2014-03-05 — End: 2014-03-06

## 2014-03-05 MED ORDER — PANTOPRAZOLE SODIUM 40 MG PO TBEC
40.0000 mg | DELAYED_RELEASE_TABLET | Freq: Every day | ORAL | Status: DC
  Administered 2014-03-05: 40 mg via ORAL
  Filled 2014-03-05: qty 1

## 2014-03-05 MED ORDER — FENTANYL CITRATE 0.05 MG/ML IJ SOLN
50.0000 ug | Freq: Once | INTRAMUSCULAR | Status: AC
Start: 2014-03-05 — End: 2014-03-05
  Administered 2014-03-05: 50 ug via INTRAVENOUS

## 2014-03-05 MED ORDER — FENTANYL CITRATE 0.05 MG/ML IJ SOLN
50.0000 ug | INTRAMUSCULAR | Status: DC | PRN
  Administered 2014-03-06: 50 ug via INTRAVENOUS
  Filled 2014-03-05: qty 2

## 2014-03-05 MED ORDER — ACETAMINOPHEN 500 MG PO TABS
1000.0000 mg | ORAL_TABLET | Freq: Four times a day (QID) | ORAL | Status: AC
  Administered 2014-03-05 – 2014-03-06 (×2): 1000 mg via ORAL
  Filled 2014-03-05 (×3): qty 2

## 2014-03-05 MED ORDER — LIDOCAINE HCL (CARDIAC) 20 MG/ML IV SOLN
INTRAVENOUS | Status: AC
  Filled 2014-03-05: qty 5

## 2014-03-05 MED ORDER — PROPOFOL 10 MG/ML IV BOLUS
INTRAVENOUS | Status: DC | PRN
  Administered 2014-03-05: 140 mg via INTRAVENOUS

## 2014-03-05 MED ORDER — ONDANSETRON HCL 4 MG/2ML IJ SOLN: 4.0000 mg | Freq: Once | INTRAMUSCULAR | Status: DC | PRN

## 2014-03-05 MED ORDER — ALBUTEROL SULFATE (2.5 MG/3ML) 0.083% IN NEBU
2.5000 mg | INHALATION_SOLUTION | RESPIRATORY_TRACT | Status: DC
  Administered 2014-03-05 – 2014-03-06 (×3): 2.5 mg via RESPIRATORY_TRACT
  Filled 2014-03-05 (×3): qty 3

## 2014-03-05 MED ORDER — KETOROLAC TROMETHAMINE 30 MG/ML IJ SOLN
30.0000 mg | Freq: Four times a day (QID) | INTRAMUSCULAR | Status: AC
  Administered 2014-03-05 – 2014-03-07 (×8): 30 mg via INTRAVENOUS
  Filled 2014-03-05 (×11): qty 1

## 2014-03-05 MED ORDER — ARTIFICIAL TEARS OP OINT
TOPICAL_OINTMENT | OPHTHALMIC | Status: AC
  Filled 2014-03-05: qty 3.5

## 2014-03-05 MED ORDER — FENTANYL 10 MCG/ML IV SOLN
INTRAVENOUS | Status: DC
  Administered 2014-03-05: 11:00:00 via INTRAVENOUS
  Administered 2014-03-05: 285 ug via INTRAVENOUS
  Administered 2014-03-05: 19:00:00 via INTRAVENOUS
  Administered 2014-03-06: 206.8 ug via INTRAVENOUS
  Administered 2014-03-06: 01:00:00 via INTRAVENOUS
  Administered 2014-03-06: 300 ug via INTRAVENOUS
  Filled 2014-03-05 (×4): qty 50

## 2014-03-05 MED ORDER — SENNOSIDES-DOCUSATE SODIUM 8.6-50 MG PO TABS
1.0000 | ORAL_TABLET | Freq: Every evening | ORAL | Status: DC | PRN
  Filled 2014-03-05: qty 1

## 2014-03-05 MED ORDER — ONDANSETRON HCL 4 MG/2ML IJ SOLN
INTRAMUSCULAR | Status: AC
  Filled 2014-03-05: qty 2

## 2014-03-05 MED ORDER — ONDANSETRON HCL 4 MG/2ML IJ SOLN: 4.0000 mg | Freq: Four times a day (QID) | INTRAMUSCULAR | Status: DC | PRN

## 2014-03-05 MED ORDER — PIROXICAM 20 MG PO CAPS
20.0000 mg | ORAL_CAPSULE | Freq: Every day | ORAL | Status: DC
  Administered 2014-03-07 – 2014-03-10 (×4): 20 mg via ORAL
  Filled 2014-03-05 (×4): qty 1

## 2014-03-05 MED ORDER — PROPOFOL 10 MG/ML IV BOLUS
INTRAVENOUS | Status: AC
  Filled 2014-03-05: qty 20

## 2014-03-05 MED ORDER — STERILE WATER FOR INJECTION IJ SOLN
INTRAMUSCULAR | Status: AC
  Filled 2014-03-05: qty 10

## 2014-03-05 MED ORDER — DEXTROSE-NACL 5-0.9 % IV SOLN
INTRAVENOUS | Status: DC
  Administered 2014-03-05 – 2014-03-06 (×4): via INTRAVENOUS
  Administered 2014-03-07: 20 mL via INTRAVENOUS

## 2014-03-05 MED ORDER — DEXTROSE 5 % IV SOLN
1.5000 g | Freq: Two times a day (BID) | INTRAVENOUS | Status: AC
  Administered 2014-03-05 – 2014-03-06 (×2): 1.5 g via INTRAVENOUS
  Filled 2014-03-05 (×2): qty 1.5

## 2014-03-05 MED ORDER — GLYCOPYRROLATE 0.2 MG/ML IJ SOLN
INTRAMUSCULAR | Status: DC | PRN
  Administered 2014-03-05: 0.6 mg via INTRAVENOUS

## 2014-03-05 MED ORDER — LIDOCAINE HCL (CARDIAC) 20 MG/ML IV SOLN
INTRAVENOUS | Status: DC | PRN
  Administered 2014-03-05: 50 mg via INTRAVENOUS

## 2014-03-05 MED ORDER — FENTANYL CITRATE 0.05 MG/ML IJ SOLN
INTRAMUSCULAR | Status: AC
  Filled 2014-03-05: qty 5

## 2014-03-05 MED ORDER — LEVOTHYROXINE SODIUM 125 MCG PO TABS
125.0000 ug | ORAL_TABLET | Freq: Every day | ORAL | Status: DC
  Administered 2014-03-06 – 2014-03-10 (×5): 125 ug via ORAL
  Filled 2014-03-05 (×6): qty 1

## 2014-03-05 MED ORDER — ONDANSETRON HCL 4 MG/2ML IJ SOLN
4.0000 mg | Freq: Four times a day (QID) | INTRAMUSCULAR | Status: DC | PRN
  Administered 2014-03-05: 4 mg via INTRAVENOUS
  Filled 2014-03-05: qty 2

## 2014-03-05 MED ORDER — SODIUM CHLORIDE 0.9 % IJ SOLN: 9.0000 mL | INTRAMUSCULAR | Status: DC | PRN

## 2014-03-05 MED ORDER — NEOSTIGMINE METHYLSULFATE 1 MG/ML IJ SOLN
INTRAMUSCULAR | Status: DC | PRN
  Administered 2014-03-05: 3 mg via INTRAVENOUS

## 2014-03-05 MED ORDER — TRAMADOL HCL 50 MG PO TABS: 50.0000 mg | ORAL_TABLET | Freq: Four times a day (QID) | ORAL | Status: DC | PRN

## 2014-03-05 MED ORDER — HYDROMORPHONE HCL PF 1 MG/ML IJ SOLN
0.2500 mg | INTRAMUSCULAR | Status: DC | PRN
  Administered 2014-03-05: 0.5 mg via INTRAVENOUS
  Administered 2014-03-05: 1 mg via INTRAVENOUS
  Administered 2014-03-05: 0.5 mg via INTRAVENOUS

## 2014-03-05 MED ORDER — ACETAMINOPHEN 160 MG/5ML PO SOLN
1000.0000 mg | Freq: Four times a day (QID) | ORAL | Status: AC
  Administered 2014-03-05 – 2014-03-06 (×2): 1000 mg via ORAL
  Filled 2014-03-05 (×4): qty 40

## 2014-03-05 MED ORDER — ROCURONIUM BROMIDE 100 MG/10ML IV SOLN
INTRAVENOUS | Status: DC | PRN
  Administered 2014-03-05 (×2): 10 mg via INTRAVENOUS
  Administered 2014-03-05: 40 mg via INTRAVENOUS

## 2014-03-05 MED ORDER — 0.9 % SODIUM CHLORIDE (POUR BTL) OPTIME
TOPICAL | Status: DC | PRN
  Administered 2014-03-05: 1000 mL

## 2014-03-05 SURGICAL SUPPLY — 74 items
BENZOIN TINCTURE PRP APPL 2/3 (GAUZE/BANDAGES/DRESSINGS) ×2 IMPLANT
CANISTER SUCTION 2500CC (MISCELLANEOUS) ×4 IMPLANT
CATH KIT ON Q 5IN SLV (PAIN MANAGEMENT) IMPLANT
CATH THORACIC 28FR (CATHETERS) ×2 IMPLANT
CATH THORACIC 36FR (CATHETERS) IMPLANT
CATH THORACIC 36FR RT ANG (CATHETERS) IMPLANT
CLIP TI MEDIUM 6 (CLIP) ×2 IMPLANT
CONT SPEC 4OZ CLIKSEAL STRL BL (MISCELLANEOUS) ×8 IMPLANT
COVER SURGICAL LIGHT HANDLE (MISCELLANEOUS) ×2 IMPLANT
DERMABOND ADVANCED (GAUZE/BANDAGES/DRESSINGS) ×1
DERMABOND ADVANCED .7 DNX12 (GAUZE/BANDAGES/DRESSINGS) ×1 IMPLANT
DRAIN CHANNEL 28F RND 3/8 FF (WOUND CARE) IMPLANT
DRAIN CHANNEL 32F RND 10.7 FF (WOUND CARE) IMPLANT
DRAPE LAPAROSCOPIC ABDOMINAL (DRAPES) ×2 IMPLANT
DRAPE PROXIMA HALF (DRAPES) ×4 IMPLANT
DRAPE WARM FLUID 44X44 (DRAPE) ×2 IMPLANT
ELECT REM PT RETURN 9FT ADLT (ELECTROSURGICAL) ×2
ELECTRODE REM PT RTRN 9FT ADLT (ELECTROSURGICAL) ×1 IMPLANT
GLOVE SURG SIGNA 7.5 PF LTX (GLOVE) ×4 IMPLANT
GOWN STRL REUS W/ TWL LRG LVL3 (GOWN DISPOSABLE) ×3 IMPLANT
GOWN STRL REUS W/ TWL XL LVL3 (GOWN DISPOSABLE) ×2 IMPLANT
GOWN STRL REUS W/TWL LRG LVL3 (GOWN DISPOSABLE) ×3
GOWN STRL REUS W/TWL XL LVL3 (GOWN DISPOSABLE) ×2
HANDLE STAPLE ENDO GIA SHORT (STAPLE) ×1
HEMOSTAT SURGICEL 2X14 (HEMOSTASIS) IMPLANT
KIT BASIN OR (CUSTOM PROCEDURE TRAY) ×2 IMPLANT
KIT ROOM TURNOVER OR (KITS) ×2 IMPLANT
KIT SUCTION CATH 14FR (SUCTIONS) ×2 IMPLANT
NS IRRIG 1000ML POUR BTL (IV SOLUTION) ×4 IMPLANT
PACK CHEST (CUSTOM PROCEDURE TRAY) ×2 IMPLANT
PAD ARMBOARD 7.5X6 YLW CONV (MISCELLANEOUS) ×4 IMPLANT
POUCH ENDO CATCH II 15MM (MISCELLANEOUS) IMPLANT
POUCH SPECIMEN RETRIEVAL 10MM (ENDOMECHANICALS) ×2 IMPLANT
RELOAD EGIA 45 MED/THCK PURPLE (STAPLE) ×6 IMPLANT
SEALANT PROGEL (MISCELLANEOUS) IMPLANT
SEALANT SURG COSEAL 4ML (VASCULAR PRODUCTS) IMPLANT
SEALANT SURG COSEAL 8ML (VASCULAR PRODUCTS) IMPLANT
SOLUTION ANTI FOG 6CC (MISCELLANEOUS) ×2 IMPLANT
SPECIMEN JAR MEDIUM (MISCELLANEOUS) ×2 IMPLANT
SPONGE GAUZE 4X4 12PLY (GAUZE/BANDAGES/DRESSINGS) ×2 IMPLANT
SPONGE GAUZE 4X4 12PLY STER LF (GAUZE/BANDAGES/DRESSINGS) ×2 IMPLANT
STAPLER ENDO GIA 12MM SHORT (STAPLE) ×1 IMPLANT
SUT PROLENE 4 0 RB 1 (SUTURE)
SUT PROLENE 4-0 RB1 .5 CRCL 36 (SUTURE) IMPLANT
SUT SILK  1 MH (SUTURE) ×1
SUT SILK 1 MH (SUTURE) ×1 IMPLANT
SUT SILK 2 0SH CR/8 30 (SUTURE) IMPLANT
SUT SILK 3 0SH CR/8 30 (SUTURE) ×2 IMPLANT
SUT VIC AB 0 CTX 27 (SUTURE) IMPLANT
SUT VIC AB 1 CTX 27 (SUTURE) IMPLANT
SUT VIC AB 1 CTX 36 (SUTURE) ×1
SUT VIC AB 1 CTX36XBRD ANBCTR (SUTURE) ×1 IMPLANT
SUT VIC AB 2-0 CT1 27 (SUTURE)
SUT VIC AB 2-0 CT1 TAPERPNT 27 (SUTURE) IMPLANT
SUT VIC AB 2-0 CTX 36 (SUTURE) ×2 IMPLANT
SUT VIC AB 3-0 MH 27 (SUTURE) IMPLANT
SUT VIC AB 3-0 SH 27 (SUTURE)
SUT VIC AB 3-0 SH 27X BRD (SUTURE) IMPLANT
SUT VIC AB 3-0 X1 27 (SUTURE) ×6 IMPLANT
SUT VICRYL 0 UR6 27IN ABS (SUTURE) ×6 IMPLANT
SUT VICRYL 2 TP 1 (SUTURE) IMPLANT
SWAB COLLECTION DEVICE MRSA (MISCELLANEOUS) IMPLANT
SYSTEM SAHARA CHEST DRAIN ATS (WOUND CARE) ×2 IMPLANT
TAPE CLOTH SURG 4X10 WHT LF (GAUZE/BANDAGES/DRESSINGS) ×2 IMPLANT
TIP APPLICATOR SPRAY EXTEND 16 (VASCULAR PRODUCTS) IMPLANT
TOWEL OR 17X24 6PK STRL BLUE (TOWEL DISPOSABLE) ×2 IMPLANT
TOWEL OR 17X26 10 PK STRL BLUE (TOWEL DISPOSABLE) ×4 IMPLANT
TRAP SPECIMEN MUCOUS 40CC (MISCELLANEOUS) IMPLANT
TRAY FOLEY CATH 16FRSI W/METER (SET/KITS/TRAYS/PACK) ×2 IMPLANT
TROCAR XCEL BLADELESS 5X75MML (TROCAR) ×2 IMPLANT
TROCAR XCEL NON-BLD 5MMX100MML (ENDOMECHANICALS) IMPLANT
TUBE ANAEROBIC SPECIMEN COL (MISCELLANEOUS) IMPLANT
TUNNELER SHEATH ON-Q 11GX8 DSP (PAIN MANAGEMENT) IMPLANT
WATER STERILE IRR 1000ML POUR (IV SOLUTION) ×4 IMPLANT

## 2014-03-05 NOTE — H&P (View-Only) (Signed)
PCP is Orpah Melter, MD Referring Provider is Orpah Melter, MD  No chief complaint on file.   HPI: 53 yo woman with a history of tobacco use and recent craniotomy for a tumor of unknown origin. Workup included a CT of the chest which showed a 5 mm nodule in the left lower lobe. A PET Ct showed the LLL nodule was hypermetabolic with an SUV of 1.9, which is significantly high for a nodule that small.  Her brain tumor was resected in December. That was complicated by a staph infection of her craniotomy. Pathology showed a malignancy. She received radiation but not chemo because the cell type of the tumor has not been definitively determined. The pathology has been sent out to several academic centers but they also have failed to ID it.  She smoked up to a pack of cigarettes a day until the time of her surgery in December. She does not have any significant cough, wheezing or shortness of breath. Her weight increased when she was on steroids. She denies fevers, chills or sweats.  Past Medical History  Diagnosis Date  . TMJ (dislocation of temporomandibular joint)   . Thyroid disease   . Lung cancer     awaiting pathology  . Brain cancer     awaiting pathology; assuming this is a lung primary with brain mets    Past Surgical History  Procedure Laterality Date  . Tonsillectomy    . Appendectomy    . Suboccipital craniectomy cervical laminectomy N/A 11/19/2013    Procedure: SUBOCCIPITAL CRANIECTOMY for tumor;  Surgeon: Eustace Moore, MD;  Location: [facility];  Service: Neurosurgery;  Laterality: N/A;  . Lumbar wound debridement N/A 12/05/2013    Procedure: irrigation and debridement posterior cervical wound;  Surgeon: Eustace Moore, MD;  Location: [facility];  Service: Neurosurgery;  Laterality: N/A;    Family History  Problem Relation Age of Onset  . Dementia Mother     Social History History  Substance Use Topics  . Smoking status: Former Smoker    Quit date: 11/19/2013   . Smokeless tobacco: Never Used  . Alcohol Use: No    Current Outpatient Prescriptions  Medication Sig Dispense Refill  . levothyroxine (SYNTHROID, LEVOTHROID) 125 MCG tablet Take 125 mcg by mouth daily before breakfast.      . methocarbamol (ROBAXIN) 500 MG tablet Take 500 mg by mouth as needed for muscle spasms.      Marland Kitchen omeprazole (PRILOSEC) 10 MG capsule Take 10 mg by mouth daily.      Marland Kitchen oxyCODONE-acetaminophen (PERCOCET/ROXICET) 5-325 MG per tablet Take 2 tablets by mouth every 4 (four) hours as needed for severe pain.  20 tablet  0  . piroxicam (FELDENE) 20 MG capsule Take 20 mg by mouth daily.      . promethazine (PHENERGAN) 25 MG tablet Take 1 tablet (25 mg total) by mouth every 6 (six) hours as needed for nausea.  30 tablet  0   No current facility-administered medications for this visit.    Allergies  Allergen Reactions  . Morphine And Related Nausea Only    Review of Systems  Constitutional: Negative for fever and chills.  Respiratory: Negative for cough, shortness of breath and wheezing.   Cardiovascular: Negative for chest pain.  Gastrointestinal: Positive for nausea (prior to brain surgery).  All other systems reviewed and are negative.    BP 104/62  Pulse 71  Temp(Src) 98 F (36.7 C)  Resp 18  Wt 133 lb  9.6 oz (60.601 kg)  SpO2 100% Physical Exam  Vitals reviewed. Constitutional: She is oriented to person, place, and time. She appears well-developed and well-nourished.  HENT:  Healing scar midline posteriorly  Eyes: EOM are normal. Pupils are equal, round, and reactive to light.  Neck: Neck supple. No thyromegaly present.  Cardiovascular: Normal rate, regular rhythm and normal heart sounds.  Exam reveals no gallop and no friction rub.   No murmur heard. Pulmonary/Chest: Effort normal and breath sounds normal. She has no wheezes. She has no rales.  Abdominal: Soft. There is no tenderness.  Musculoskeletal: She exhibits no edema.  Lymphadenopathy:    She  has no cervical adenopathy.  Neurological: She is alert and oriented to person, place, and time. No cranial nerve deficit.  Skin: Skin is warm and dry.     Diagnostic Tests: CT CHEST, ABDOMEN, AND PELVIS WITH CONTRAST  TECHNIQUE:  Multidetector CT imaging of the chest, abdomen and pelvis was  performed following the standard protocol during bolus  administration of intravenous contrast.  CONTRAST: 92mL OMNIPAQUE IOHEXOL 300 MG/ML SOLN  COMPARISON: Brain MRI 11/14/2013  FINDINGS:  CT CHEST FINDINGS  No axillary or supraclavicular lymphadenopathy. No mediastinal hilar  lymphadenopathy. No pericardial fluid. No central pulmonary  embolism. The thyroid gland is diminutive.  Review of the lung parenchyma demonstrates a smudgy nodule (sub  solid) measuring 6 mm in the posterior aspect of the superior  segment of the right lower lobe (image 22). There is a small  spiculated nodule measuring 5 mm in superior segment left lower lobe  (image 21). There is mild medial atelectasis the lung bases.  CT ABDOMEN AND PELVIS FINDINGS  No focal hepatic lesion. Gallbladder, pancreas, spleen, adrenal  glands, and kidneys are normal. Small nonobstructing calculus in the  right kidney  The stomach, small bowel, and cecum normal. The colon rectosigmoid  colon normal without evidence of mass lesion or obstruction.  The abdominal aorta is normal caliber. There is no retroperitoneal  retrocrural adenopathy. No mesenteric periportal adenopathy.  No free fluid the pelvis. The uterus and bladder normal. Ovaries are  normal. No aggressive osseous lesion. No pelvic lymphadenopathy.  IMPRESSION:  1. Small spiculated nodule in the superior segment of the left lower  lobe in the setting of the intracranial metastasis is concerning for  metastatic lesion versus less likely primary lesion.  2. Sub solid nodule in the right lower lobe is nonspecific.  3. No evidence of primary lesion in the chest abdomen or pelvis.   Electronically Signed  By: Suzy Bouchard M.D.  On: 11/15/2013 14:29   NUCLEAR MEDICINE PET SKULL BASE TO THIGH  FASTING BLOOD GLUCOSE: Value: 77mg /dl  TECHNIQUE:  7.6 mCi F-18 FDG was injected intravenously. CT data was obtained  and used for attenuation correction and anatomic localization only.  (This was not acquired as a diagnostic CT examination.) Additional  exam technical data entered on technologist worksheet.  COMPARISON: CT CHEST W/CM dated 11/15/2013  FINDINGS:  NECK  No hypermetabolic lymph nodes in the neck.  CHEST  5 x 4 mm nodule in the superior segment left lower lobe is  hypermetabolic. Although the maximum standard uptake value is only  1.9, this is still disproportionate to the very small size of this  lesion. Previously seen ground-glass nodule along the right major  fissure is not well seen although there is surrounding dependent  subsegmental atelectasis. Mild lingular and right middle lobe  subsegmental atelectasis observed.  ABDOMEN/PELVIS  Bowel activity and anal  activity, likely physiologic. Fast no  findings suspicious for malignancy in the abdomen or pelvis.  Aortoiliac atherosclerotic vascular disease. 2 mm right mid kidney  nonobstructive calculus. 1 mm left kidney lower pole nonobstructive  calculus. Nonspecific 6 mm hyperdense lesion in the left mid kidney  laterally, probably a complex cyst but technically nonspecific.  There is a borderline dilated loop of proximal jejunum.  SKELETON  No focal hypermetabolic activity to suggest skeletal metastasis.  IMPRESSION:  1. The 5 x 4 mm nodule in the superior segment left lower lobe is  hypermetabolic, concerning for malignancy. Active granulomatous  process is a less likely differential diagnostic consideration.  2. Bilateral nonobstructive nephrolithiasis.  3. 6 mm hyperdense lesion in the left mid kidney, probably a complex  cysts but technically nonspecific.  Electronically Signed  By: Sherryl Barters M.D.  On: 01/02/2014 15:17  Impression: 53 yo woman with a 5 mm nodule in the superior segment of the left lower lobe. The nodule is hypermetabolic on PET which makes it very suspicious for malignancy. Normally a nodule of this size would be followed, but the other factor to consider is her recent brain tumor that has not thus far been able to be characterized and could be a metastasis from the lung.  I recommended that we do a left VATS and wedge resection of the LLL nodule for both diagnostic and therapeutic purposes. It will not only be therapeutic in terms of the nodule but also potentially may help elucidate the origin of the brain lesion, which could allow for appropriate systemic therapy to be given.  I discussed with Mrs. Howlett and her husband the general nature of the procedure, the need for general anesthesia, and the incisions to be used. We discussed the expected hospital stay, overall recovery and short and long term outcomes. They understand the risks of surgery include, but are not limited to death, stroke, MI, DVT/PE, bleeding, possible need for transfusion, infections, air leaks, and other organ system dysfunction including respiratory, renal, or GI complications.   She accepts the risks and agrees to proceed  Plan:  Plan Left VATS, wedge resection

## 2014-03-05 NOTE — Anesthesia Procedure Notes (Addendum)
Procedure Name: Intubation Date/Time: 03/05/2014 8:50 AM Performed by: Storm Frisk E Pre-anesthesia Checklist: Patient identified, Timeout performed, Emergency Drugs available, Suction available and Patient being monitored Patient Re-evaluated:Patient Re-evaluated prior to inductionOxygen Delivery Method: Circle system utilized Preoxygenation: Pre-oxygenation with 100% oxygen Intubation Type: IV induction, Inhalational induction and Cricoid Pressure applied Ventilation: Mask ventilation without difficulty and Oral airway inserted - appropriate to patient size Laryngoscope Size: Mac, 3, Miller and 2 Grade View: Grade II Endobronchial tube: Double lumen EBT and Left and 35 Fr Number of attempts: 4 Airway Equipment and Method: Stylet Placement Confirmation: ETT inserted through vocal cords under direct vision,  breath sounds checked- equal and bilateral and positive ETCO2 Secured at: 28 cm Tube secured with: Tape Dental Injury: Teeth and Oropharynx as per pre-operative assessment  Difficulty Due To: Difficulty was unanticipated Future Recommendations: Recommend- induction with short-acting agent, and alternative techniques readily available Comments: 2 unsuccessful attempts by [place] with MAC ; arytenoids visualized but not cords.  1 unsuccessful attempt by Hinda Glatter CRNA with Parmele 3 CRNA; 1 successful attempt by Dr. Linna Caprice with Sabra Heck 2.  Pt. Had small mouth, large teeth, and small teeth making it difficult for DLT to pass.    RIJ CVP Dual Lumen: [id number]: The patient was identified and consent obtained.  TO was performed, and full barrier precautions were used.  The skin was anesthetized with lidocaine.  Once the vein was located with the 22 ga. needle using ultrasound guidance , the wire was inserted into the vein.  The wire location was confirmed with ultrasound.  The tissue was dilated and the catheter was carefully inserted, then sutured in place. A dressing was applied. The  patient tolerated the procedure well.   CE

## 2014-03-05 NOTE — Brief Op Note (Addendum)
      [address]       [place] [address]             [phone]     03/05/2014  10:16 AM  PATIENT:  [name] [name]  53 y.o. female  PRE-OPERATIVE DIAGNOSIS:  lung nodule  POST-OPERATIVE DIAGNOSIS:  lung nodule  PROCEDURE:  Procedure(s): VIDEO ASSISTED THORACOSCOPY (VATS)/WEDGE RESECTION LLL WITH LYMPH NODE SAMPLING  SURGEON:  Surgeon(s): Melrose Nakayama, MD  PHYSICIAN ASSISTANT: WAYNE GOLD PA-C  ANESTHESIA:   general  SPECIMEN:  Source of Specimen:  LLL WEDGE RESECTION AND LN SAMPLES  DISPOSITION OF SPECIMEN:  Pathology  DRAINS: 1  Chest Tube(s) in the LEFT HEMITHORAX   PATIENT CONDITION:  PACU - hemodynamically stable.  PRE-OPERATIVE WEIGHT: 23NT  COMPLICATIONS: NO KNOWN  FROZEN: + malignancy- same unusual appearance as brain mass, margins negative

## 2014-03-05 NOTE — Anesthesia Postprocedure Evaluation (Signed)
  Anesthesia Post-op Note  Patient: [name] [name]  Procedure(s) Performed: Procedure(s): VIDEO ASSISTED THORACOSCOPY (VATS)/WEDGE RESECTION (Left)  Patient Location: PACU  Anesthesia Type:General  Level of Consciousness: awake, alert  and oriented  Airway and Oxygen Therapy: Patient Spontanous Breathing and Patient connected to nasal cannula oxygen  Post-op Pain: mild  Post-op Assessment: Post-op Vital signs reviewed, Patient's Cardiovascular Status Stable, Respiratory Function Stable, Patent Airway and Pain level controlled  Post-op Vital Signs: stable  Last Vitals:  Filed Vitals:   03/05/14 1130  BP: 121/62  Pulse: 62  Temp:   Resp: 11    Complications: No apparent anesthesia complications

## 2014-03-05 NOTE — Interval H&P Note (Signed)
History and Physical Interval Note:  03/05/2014 7:57 AM  [name] [name]  has presented today for surgery, with the diagnosis of lung nodule  The various methods of treatment have been discussed with the patient and family. After consideration of risks, benefits and other options for treatment, the patient has consented to  Procedure(s): VIDEO ASSISTED THORACOSCOPY (VATS)/WEDGE RESECTION (Left) as a surgical intervention .  The patient's history has been reviewed, patient examined, no change in status, stable for surgery.  I have reviewed the patient's chart and labs.  Questions were answered to the patient's satisfaction.     Melrose Nakayama

## 2014-03-05 NOTE — Progress Notes (Signed)
Utilization review completed.  

## 2014-03-05 NOTE — Anesthesia Preprocedure Evaluation (Addendum)
Anesthesia Evaluation  Patient identified by MRN, date of birth, ID band Patient awake    Reviewed: Allergy & Precautions, H&P , NPO status , Patient's Chart, lab work & pertinent test results  Airway Mallampati: II TM Distance: >3 FB Neck ROM: Full    Dental  (+) Teeth Intact, Dental Advisory Given, Chipped   Pulmonary former smoker,  breath sounds clear to auscultation        Cardiovascular Rhythm:Regular Rate:Normal     Neuro/Psych PSYCHIATRIC DISORDERS Anxiety  Neuromuscular disease    GI/Hepatic GERD-  Controlled and Medicated,  Endo/Other  Hypothyroidism   Renal/GU      Musculoskeletal   Abdominal   Peds  Hematology   Anesthesia Other Findings TMJ; no current clicking or dislocation Multiple Broken teeth but intact per patient. GERD: symptoms when lay flat and difficulty swallowing but medicated and pt. Reports symptoms improved. Brain Mets s/p Crainiotomy Lung Nodule Nicotine patches on B/L arms  Reproductive/Obstetrics                      Anesthesia Physical Anesthesia Plan  ASA: III  Anesthesia Plan: General   Post-op Pain Management:    Induction: Intravenous  Airway Management Planned: Oral ETT  Additional Equipment: Arterial line and CVP  Intra-op Plan:   Post-operative Plan: Extubation in OR  Informed Consent: I have reviewed the patients History and Physical, chart, labs and discussed the procedure including the risks, benefits and alternatives for the proposed anesthesia with the patient or authorized representative who has indicated his/her understanding and acceptance.   Dental advisory given  Plan Discussed with: CRNA, Anesthesiologist and Surgeon  Anesthesia Plan Comments:         Anesthesia Quick Evaluation

## 2014-03-05 NOTE — Transfer of Care (Signed)
Immediate Anesthesia Transfer of Care Note  Patient: [name] [name]  Procedure(s) Performed: Procedure(s): VIDEO ASSISTED THORACOSCOPY (VATS)/WEDGE RESECTION (Left)  Patient Location: PACU  Anesthesia Type:General  Level of Consciousness: awake, alert , oriented and pateint uncooperative  Airway & Oxygen Therapy: Patient Spontanous Breathing and Patient connected to face mask oxygen  Post-op Assessment: Report given to PACU RN and Post -op Vital signs reviewed and stable  Post vital signs: Reviewed and stable  Complications: No apparent anesthesia complications

## 2014-03-06 ENCOUNTER — Inpatient Hospital Stay (HOSPITAL_COMMUNITY): Payer: 59

## 2014-03-06 ENCOUNTER — Encounter (HOSPITAL_COMMUNITY): Payer: Self-pay | Admitting: Thoracic Surgery (Cardiothoracic Vascular Surgery)

## 2014-03-06 LAB — BLOOD GAS, ARTERIAL
Acid-Base Excess: 0.8 mmol/L (ref 0.0–2.0)
BICARBONATE: 25 meq/L — AB (ref 20.0–24.0)
Drawn by: 40415
FIO2: 0.21 %
O2 SAT: 99.8 %
PATIENT TEMPERATURE: 97.9
TCO2: 26.3 mmol/L (ref 0–100)
pCO2 arterial: 40.7 mmHg (ref 35.0–45.0)
pH, Arterial: 7.404 (ref 7.350–7.450)
pO2, Arterial: 133 mmHg — ABNORMAL HIGH (ref 80.0–100.0)

## 2014-03-06 LAB — CBC
HEMATOCRIT: 33.9 % — AB (ref 36.0–46.0)
Hemoglobin: 11.5 g/dL — ABNORMAL LOW (ref 12.0–15.0)
MCH: 32.1 pg (ref 26.0–34.0)
MCHC: 33.9 g/dL (ref 30.0–36.0)
MCV: 94.7 fL (ref 78.0–100.0)
PLATELETS: 314 10*3/uL (ref 150–400)
RBC: 3.58 MIL/uL — ABNORMAL LOW (ref 3.87–5.11)
RDW: 13.4 % (ref 11.5–15.5)
WBC: 12.5 10*3/uL — ABNORMAL HIGH (ref 4.0–10.5)

## 2014-03-06 LAB — BASIC METABOLIC PANEL
BUN: 12 mg/dL (ref 6–23)
CO2: 23 meq/L (ref 19–32)
CREATININE: 0.47 mg/dL — AB (ref 0.50–1.10)
Calcium: 8.6 mg/dL (ref 8.4–10.5)
Chloride: 101 mEq/L (ref 96–112)
GFR calc Af Amer: >90 mL/min (ref >90)
GFR calc non Af Amer: >90 mL/min (ref >90)
GLUCOSE: 155 mg/dL — AB (ref 70–99)
Potassium: 3.1 mEq/L — ABNORMAL LOW (ref 3.7–5.3)
Sodium: 139 mEq/L (ref 137–147)

## 2014-03-06 LAB — GLUCOSE, CAPILLARY
GLUCOSE-CAPILLARY: 159 mg/dL — AB (ref 70–99)
GLUCOSE-CAPILLARY: 109 mg/dL — AB (ref 70–99)

## 2014-03-06 MED ORDER — ALBUTEROL SULFATE (2.5 MG/3ML) 0.083% IN NEBU: 2.5000 mg | INHALATION_SOLUTION | Freq: Four times a day (QID) | RESPIRATORY_TRACT | Status: DC | PRN

## 2014-03-06 MED ORDER — HYDROMORPHONE HCL 2 MG PO TABS
2.0000 mg | ORAL_TABLET | ORAL | Status: DC | PRN
  Administered 2014-03-07 – 2014-03-10 (×15): 2 mg via ORAL
  Filled 2014-03-06 (×15): qty 1

## 2014-03-06 MED ORDER — PROMETHAZINE HCL 25 MG/ML IJ SOLN: 12.5000 mg | Freq: Four times a day (QID) | INTRAMUSCULAR | Status: DC | PRN

## 2014-03-06 MED ORDER — PANTOPRAZOLE SODIUM 40 MG PO TBEC
40.0000 mg | DELAYED_RELEASE_TABLET | Freq: Every day | ORAL | Status: DC
  Administered 2014-03-06 – 2014-03-10 (×5): 40 mg via ORAL
  Filled 2014-03-06 (×4): qty 1

## 2014-03-06 MED ORDER — POTASSIUM CHLORIDE 10 MEQ/50ML IV SOLN
10.0000 meq | INTRAVENOUS | Status: AC
  Administered 2014-03-06 (×4): 10 meq via INTRAVENOUS
  Filled 2014-03-06 (×2): qty 50

## 2014-03-06 MED ORDER — CALCIUM CARBONATE ANTACID 500 MG PO CHEW
400.0000 mg | CHEWABLE_TABLET | Freq: Three times a day (TID) | ORAL | Status: DC | PRN
  Administered 2014-03-06: 400 mg via ORAL
  Filled 2014-03-06: qty 2

## 2014-03-06 MED ORDER — HYDROMORPHONE HCL PF 1 MG/ML IJ SOLN: 1.0000 mg | INTRAMUSCULAR | Status: DC | PRN

## 2014-03-06 MED ORDER — ENOXAPARIN SODIUM 40 MG/0.4ML ~~LOC~~ SOLN
40.0000 mg | SUBCUTANEOUS | Status: DC
  Administered 2014-03-06: 40 mg via SUBCUTANEOUS
  Filled 2014-03-06 (×5): qty 0.4

## 2014-03-06 MED ORDER — HYDROMORPHONE HCL PF 1 MG/ML IJ SOLN
1.0000 mg | INTRAMUSCULAR | Status: DC | PRN
  Administered 2014-03-06: 2 mg via INTRAVENOUS
  Administered 2014-03-06 – 2014-03-09 (×18): 1 mg via INTRAVENOUS
  Filled 2014-03-06 (×12): qty 1
  Filled 2014-03-06 (×2): qty 2
  Filled 2014-03-06: qty 1
  Filled 2014-03-06: qty 2
  Filled 2014-03-06 (×3): qty 1

## 2014-03-06 MED ORDER — ONDANSETRON HCL 4 MG/2ML IJ SOLN
INTRAMUSCULAR | Status: AC
  Administered 2014-03-06: 4 mg via INTRAVENOUS
  Filled 2014-03-06: qty 2

## 2014-03-06 MED ORDER — METOCLOPRAMIDE HCL 5 MG/ML IJ SOLN
10.0000 mg | Freq: Four times a day (QID) | INTRAMUSCULAR | Status: DC
  Administered 2014-03-07 – 2014-03-10 (×15): 10 mg via INTRAVENOUS
  Filled 2014-03-06 (×18): qty 2

## 2014-03-06 MED ORDER — ONDANSETRON HCL 4 MG/2ML IJ SOLN
4.0000 mg | Freq: Four times a day (QID) | INTRAMUSCULAR | Status: DC | PRN
  Administered 2014-03-06 – 2014-03-08 (×2): 4 mg via INTRAVENOUS
  Filled 2014-03-06: qty 2

## 2014-03-06 NOTE — Progress Notes (Signed)
1 Day Post-Op Procedure(s) (LRB): VIDEO ASSISTED THORACOSCOPY (VATS)/WEDGE RESECTION (Left) Subjective: C/o pain Says that PCA locks her out and she has to wait too long for the lockout to expire. She received dilaudid given by RNs after her brain surgery and wants to do that. No difficulty breathing  Objective: Vital signs in last 24 hours: Temp:  [97.4 F (36.3 C)-98.1 F (36.7 C)] 98 F (36.7 C) (04/09 0700) Pulse Rate:  [58-90] 90 (04/09 0540) Cardiac Rhythm:  [-] Normal sinus rhythm (04/09 0003) Resp:  [11-24] 20 (04/09 0540) BP: (106-126)/(56-78) 116/56 mmHg (04/09 0540) SpO2:  [96 %-100 %] 100 % (04/09 0540) Arterial Line BP: (120-157)/(54-69) 157/69 mmHg (04/09 0540) Weight:  [146 lb 6.2 oz (66.4 kg)] 146 lb 6.2 oz (66.4 kg) (04/08 2025)  Hemodynamic parameters for last 24 hours:    Intake/Output from previous day: 04/08 0701 - 04/09 0700 In: 2180 [P.O.:480; I.V.:1700] Out: 546 [Urine:380; Blood:30; Chest Tube:136] Intake/Output this shift:    General appearance: alert and extremely anxious Neurologic: intact Heart: regular rate and rhythm Lungs: clear to auscultation bilaterally no air leak  Lab Results:  Recent Labs  03/03/14 1352 03/06/14 0530  WBC 5.9 12.5*  HGB 13.9 11.5*  HCT 40.1 33.9*  PLT 335 314   BMET:  Recent Labs  03/03/14 1352 03/06/14 0530  NA 140 139  K 3.3* 3.1*  CL 103 101  CO2 20 23  GLUCOSE 103* 155*  BUN 14 12  CREATININE 0.61 0.47*  CALCIUM 9.3 8.6    PT/INR:  Recent Labs  03/03/14 1352  LABPROT 14.4  INR 1.14   ABG    Component Value Date/Time   PHART 7.404 03/06/2014 0536   HCO3 25.0* 03/06/2014 0536   TCO2 26.3 03/06/2014 0536   ACIDBASEDEF 0.5 03/03/2014 1352   O2SAT 99.8 03/06/2014 0536   CBG (last 3)   Recent Labs  03/05/14 2228 03/06/14 0739  GLUCAP 159* 109*    Assessment/Plan: S/P Procedure(s) (LRB): VIDEO ASSISTED THORACOSCOPY (VATS)/WEDGE RESECTION (Left) POD # 1 Doing well Will place CT to  water seal. If no air leak and CXR ok will dc CT in AM Hypokalemia- supplement Protonix, tums, carafate for GER Dc fentanyl PCA- dilaudid PO/IV as needed for pain Ambulate SCD + enoxaparin for DVT prophylaxis   LOS: 1 day    Melrose Nakayama 03/06/2014

## 2014-03-06 NOTE — Progress Notes (Signed)
Pt foley catheter removed per MD order. Pt tolerated well. Catheter intact. Will continue to monitor patient. Candyce Churn

## 2014-03-06 NOTE — Progress Notes (Signed)
Pt arterial line bleeding upon entering room. Pt states "I don't know what happened". Arterial line d/c'd via MD order. Catheter tip intact. Pt tolerated well. Pressure held for 10 minutes. Pressure dressing applied. Will continue to monitor pt. Candyce Churn

## 2014-03-07 ENCOUNTER — Inpatient Hospital Stay (HOSPITAL_COMMUNITY): Payer: 59

## 2014-03-07 LAB — CBC
HCT: 35.5 % — ABNORMAL LOW (ref 36.0–46.0)
Hemoglobin: 11.9 g/dL — ABNORMAL LOW (ref 12.0–15.0)
MCH: 32.2 pg (ref 26.0–34.0)
MCHC: 33.5 g/dL (ref 30.0–36.0)
MCV: 95.9 fL (ref 78.0–100.0)
PLATELETS: 293 10*3/uL (ref 150–400)
RBC: 3.7 MIL/uL — AB (ref 3.87–5.11)
RDW: 13.5 % (ref 11.5–15.5)
WBC: 7.2 10*3/uL (ref 4.0–10.5)

## 2014-03-07 LAB — COMPREHENSIVE METABOLIC PANEL
ALT: 10 U/L (ref 0–35)
AST: 18 U/L (ref 0–37)
Albumin: 3 g/dL — ABNORMAL LOW (ref 3.5–5.2)
Alkaline Phosphatase: 73 U/L (ref 39–117)
BUN: 4 mg/dL — ABNORMAL LOW (ref 6–23)
CALCIUM: 9 mg/dL (ref 8.4–10.5)
CO2: 25 mEq/L (ref 19–32)
Chloride: 102 mEq/L (ref 96–112)
Creatinine, Ser: 0.44 mg/dL — ABNORMAL LOW (ref 0.50–1.10)
GFR calc Af Amer: >90 mL/min (ref >90)
GFR calc non Af Amer: >90 mL/min (ref >90)
Glucose, Bld: 86 mg/dL (ref 70–99)
Potassium: 3.2 mEq/L — ABNORMAL LOW (ref 3.7–5.3)
Sodium: 141 mEq/L (ref 137–147)
Total Bilirubin: 0.6 mg/dL (ref 0.3–1.2)
Total Protein: 5.7 g/dL — ABNORMAL LOW (ref 6.0–8.3)

## 2014-03-07 MED ORDER — MIDAZOLAM HCL 2 MG/2ML IJ SOLN
2.0000 mg | Freq: Once | INTRAMUSCULAR | Status: AC
  Administered 2014-03-07: 2 mg via INTRAVENOUS
  Filled 2014-03-07: qty 2

## 2014-03-07 MED ORDER — POTASSIUM CHLORIDE 10 MEQ/50ML IV SOLN
10.0000 meq | INTRAVENOUS | Status: AC
  Administered 2014-03-07 (×2): 10 meq via INTRAVENOUS
  Filled 2014-03-07: qty 50

## 2014-03-07 MED ORDER — SUCRALFATE 1 GM/10ML PO SUSP
1.0000 g | Freq: Three times a day (TID) | ORAL | Status: DC
  Administered 2014-03-07 – 2014-03-10 (×12): 1 g via ORAL
  Filled 2014-03-07 (×16): qty 10

## 2014-03-07 NOTE — Discharge Summary (Signed)
Physician Discharge Summary  Patient ID: [name] [name] MRN: [id number] DOB/AGE: [birth year] 53 y.o.  Admit date: 03/05/2014 Discharge date: 03/07/2014  Admission Diagnoses:  Patient Active Problem List   Diagnosis Date Noted  . Candidiasis of mouth 12/16/2013  . Wound infection after surgery 12/05/2013  . Solitary brain metastasis from melanoma 12/03/2013  . S/P craniotomy 11/19/2013  . Lung nodule 11/16/2013  . Cerebellar mass 11/14/2013  . Hypothyroid 11/14/2013  . Tobacco abuse 11/14/2013   Discharge Diagnoses: Malignant Melanoma metastatic to lung  Patient Active Problem List   Diagnosis Date Noted  . Candidiasis of mouth 12/16/2013  . Wound infection after surgery 12/05/2013  . Solitary brain metastasis from melanoma 12/03/2013  . S/P craniotomy 11/19/2013  . Lung nodule 11/16/2013  . Cerebellar mass 11/14/2013  . Hypothyroid 11/14/2013  . Tobacco abuse 11/14/2013   Discharged Condition: good  History of Present Illness:   [name] [name] is a 53 yo female with known history of Tobacco abuse.  She recently underwent Craniotomy for a tumor that was unknown in origin.  This was performed in December and was complicated by development of a staph infection.  Pathology confirmed malignancy, however despite being studied at several academic centers the type of malignancy was not identified.  She has completed radiation therapy. During workup for her surgery, CT scan of the chest was obtained that showed a 5 mm nodule in the left lower lobe.  Further evaluation with CT scan showed evidence of hypermetabolic activity in the nodule.  After she healed from her brain surgery she was referred to [facility] for further evaluation.  She was evaluated by [name] on 02/20/2014 at which time after review of her CT scan it was felt that due to recent brain tumor with unidentified pathology, that she proceed with VATS with resection rather than observation.  The risks and benefits of the procedure  were explained to the patient and she was agreeable to proceed.  Hospital Course:   The patient presented to [facility] on [facility]/05/2014.  She was taken to the operating room and underwent Left VATS with wedge resection of LLL.  She also underwent lymph node sampling.  She tolerated the procedure well, was extubated and taken to the PACU in stable condition.  The patient has done well post operatively.  Her chest tube did not exhibit evidence of air leak.  This was transitioned to water seal and follow up CXR was obtained.  This remained stable with no evidence of pneumothorax.  Therefore, her chest tube was removed without difficulty.  She did complain of post operative pain stating her Fentanyl PCA was not providing adequate relief.  She was therefore transitioned to an oral regimen of Dilaudid which worked for her post Craniotomy.  The patient developed a rash localized around her IV site on her left arm.  This was felt to be possible phlebitis and her IV was removed without difficulty.  She has had some nausea post operatively, which has resolved.  We will advance her diet as she tolerates.  She is otherwise medically stable.  Repeat CXR will be obtained in the morning and should no pneumothorax be present, we anticipate discharge home in the next 24-48 hours.  Final Pathology is malignant melanoma . Please see report for full details  She will follow up with [name] in 2 weeks with a CXR prior to her appointment.  Significant Diagnostic Studies: radiology: CT scan:   1. Small spiculated nodule in the superior  segment of the left lower  lobe in the setting of the intracranial metastasis is concerning for  metastatic lesion versus less likely primary lesion.  2. Sub solid nodule in the right lower lobe is nonspecific.  3. No evidence of primary lesion in the chest abdomen or pelvis.  PET CT SCAN:   1. The 5 x 4 mm nodule in the superior segment left lower lobe is  hypermetabolic,  concerning for malignancy. Active granulomatous  process is a less likely differential diagnostic consideration.  2. Bilateral nonobstructive nephrolithiasis.  3. 6 mm hyperdense lesion in the left mid kidney, probably a complex  cysts but technically nonspecific.   Treatments: surgery:    VIDEO ASSISTED THORACOSCOPY (VATS)/WEDGE RESECTION LLL WITH LYMPH NODE SAMPLING   PATHOLOGY: Diagnosis 1. Lung, wedge biopsy/resection, left lower lobe - MALIGNANT NEOPLASM CONSISTENT WITH MALIGNANT MELANOMA. - MARGINS NOT INVOLVED. 2. Lymph node, biopsy, Left level 11 - ANTHRACOTIC LYMPH NODE. - NO MALIGNANCY IDENTIFIED. 3. Lymph node, biopsy, Left level 5 - ANTHRACOTIC LYMPH NODE. - NO MALIGNANCY IDENTIFIED. 4. Lymph node, biopsy, Left level 9 - ANTHRACOTIC LYMPH NODE. - NO MALIGNANCY IDENTIFIED. Disposition: 01-Home or Self Care  Discharge Medications:     Medication List         HYDROmorphone 2 MG tablet  Commonly known as:  DILAUDID  Take 1 tablet (2 mg total) by mouth every 6 (six) hours as needed for moderate pain or severe pain.     levothyroxine 125 MCG tablet  Commonly known as:  SYNTHROID, LEVOTHROID  Take 125 mcg by mouth daily before breakfast.     methocarbamol 500 MG tablet  Commonly known as:  ROBAXIN  Take 500 mg by mouth as needed for muscle spasms.     omeprazole 10 MG capsule  Commonly known as:  PRILOSEC  Take 10 mg by mouth daily.     pantoprazole 40 MG tablet  Commonly known as:  PROTONIX  Take 1 tablet (40 mg total) by mouth daily.     piroxicam 20 MG capsule  Commonly known as:  FELDENE  Take 20 mg by mouth daily.     sucralfate 1 G tablet  Commonly known as:  CARAFATE  Take 1 g by mouth 4 (four) times daily.         Future Appointments Provider Department Dept Phone   03/25/2014 12:00 PM Melrose Nakayama, MD [facility] [place] [phone]   04/04/2014 3:00 PM [facility] [place] [phone]   Please arrive 15 minutes prior to your appointment time.   04/07/2014 9:00 AM Lora Paula, MD [facility] [phone]         Follow-up Information   Follow up with [facility] On 03/25/2014. (Please get CXR at 11:00)    Contact information:   [place]       Follow up with Melrose Nakayama, MD On 03/25/2014. (Appointment is at 12:00)    Specialty:  Cardiothoracic Surgery   Contact information:   [address] [place] [address] [phone]       Signed: Ellwood Handler 03/07/2014, 9:36 AM

## 2014-03-07 NOTE — Discharge Instructions (Signed)
Video-Assisted Thoracic Surgery °Care After °Refer to this sheet in the next few weeks. These instructions provide you with information on caring for yourself after your procedure. Your caregiver may also give you more specific instructions. Your procedure has been planned according to current medical practices, but problems sometimes occur. Call your caregiver if you have any problems or questions after your procedure. °HOME CARE INSTRUCTIONS  °· Only take over-the-counter or prescription medications as directed. °· Only take pain medications (narcotics) as directed. °· Do not drive until your caregiver approves. Driving while taking narcotics or soon after surgery can be dangerous, so discuss the specific timing with your caregiver. °· Avoid activities that use your chest muscles, such as lifting heavy objects, for at least 3 4 weeks.   °· Take deep breaths to expand the lungs and to protect against pneumonia. °· Do breathing exercises as directed by your caregiver. If you were given an incentive spirometer to help with breathing, use it as directed. °· You may resume a normal diet and activities when you feel you are able to or as directed. °· Do not take a bath until your caregiver says it is OK. Use the shower instead.   °· Keep the bandage (dressing) covering the area where the chest tube was inserted (incision site) dry for 48 hours. After 48 hours, remove the dressing unless there is new drainage. °· Remove dressings as directed by your caregiver. °· Change dressings if necessary or as directed. °· Keep all follow-up appointments. It is important for you to see your caregiver after surgery to discuss appropriate follow-up care and surveillance, if it is necessary. °SEEK MEDICAL CARE: °· You feel excessive or increasing pain at an incision site. °· You notice bleeding, skin irritation, drainage, swelling, or redness at an incision site. °· There is a bad smell coming from an incision or dressing. °· It feels  like your heart is fluttering or beating rapidly. °· Your pain medication does not relieve your pain. °SEEK IMMEDIATE MEDICAL CARE IF:  °· You have a fever.   °· You have chest pain.  °· You have a rash. °· You have shortness of breath. °· You have trouble breathing.   °· You feel weak, lightheaded, dizzy, or faint.   °MAKE SURE YOU:  °· Understand these instructions.   °· Will watch your condition.   °· Will get help right away if you are not doing well or get worse. °Document Released: 03/11/2013 Document Reviewed: 03/11/2013 °[employer]® Patient Information ©2014 [employer]. ° °

## 2014-03-07 NOTE — Progress Notes (Signed)
Pt CT d/c per MD order, pt tol well, pt with no complaints, pt educated to call nursing for concerns and SOB, CP, pt verbalized understanding of education

## 2014-03-07 NOTE — Progress Notes (Signed)
2 Days Post-Op Procedure(s) (LRB): VIDEO ASSISTED THORACOSCOPY (VATS)/WEDGE RESECTION (Left) Subjective: Feels better today Both pain and nausea are improved C/o "rash" on left forearm  Objective: Vital signs in last 24 hours: Temp:  [97.8 F (36.6 C)-98.6 F (37 C)] 97.8 F (36.6 C) (04/10 0716) Pulse Rate:  [74-105] 74 (04/10 0350) Cardiac Rhythm:  [-] Normal sinus rhythm (04/10 0350) Resp:  [15-22] 15 (04/10 0350) BP: (121-148)/(59-76) 125/62 mmHg (04/10 0350) SpO2:  [92 %-99 %] 97 % (04/10 0350) Arterial Line BP: (135-145)/(61-64) 145/64 mmHg (04/09 1100)  Hemodynamic parameters for last 24 hours:    Intake/Output from previous day: 04/09 0701 - 04/10 0700 In: 2415.8 [P.O.:940; I.V.:1275.8; IV Piggyback:200] Out: 2160 [Urine:1900; Chest Tube:260] Intake/Output this shift: Total I/O In: -  Out: 250 [Urine:250]  General appearance: alert and no distress Neurologic: intact Heart: regular rate and rhythm Lungs: diminished breath sounds bibasilar no air leak, serous drainage from CT  Lab Results:  Recent Labs  03/06/14 0530 03/07/14 0450  WBC 12.5* 7.2  HGB 11.5* 11.9*  HCT 33.9* 35.5*  PLT 314 293   BMET:  Recent Labs  03/06/14 0530 03/07/14 0450  NA 139 141  K 3.1* 3.2*  CL 101 102  CO2 23 25  GLUCOSE 155* 86  BUN 12 4*  CREATININE 0.47* 0.44*  CALCIUM 8.6 9.0    PT/INR: No results found for this basename: LABPROT, INR,  in the last 72 hours ABG    Component Value Date/Time   PHART 7.404 03/06/2014 0536   HCO3 25.0* 03/06/2014 0536   TCO2 26.3 03/06/2014 0536   ACIDBASEDEF 0.5 03/03/2014 1352   O2SAT 99.8 03/06/2014 0536   CBG (last 3)   Recent Labs  03/05/14 2228 03/06/14 0739  GLUCAP 159* 109*    Assessment/Plan: S/P Procedure(s) (LRB): VIDEO ASSISTED THORACOSCOPY (VATS)/WEDGE RESECTION (Left) POD # 2 Looks better today Pain control better with dilaudid Nausea improved- advance diet as tolerated No air leak or pneumo on water seal- dc  CT Path pending Ambulate Remove IV from left antecubital fossa, warm compresses to left forearm   LOS: 2 days    [name] [name] 03/07/2014

## 2014-03-07 NOTE — Progress Notes (Signed)
Talked with Dr. Saralyn Pilar of Pathology  Tumor stains c/w melanoma  D/w patient and her husband

## 2014-03-07 NOTE — Op Note (Signed)
[name], [name]              ACCOUNT NO.:  [id number]  MEDICAL RECORD NO.:  [id number]  LOCATION:  17S14C                        FACILITY:  [facility]  PHYSICIAN:  Revonda Standard. Roxan Hockey, M.D.DATE OF BIRTH:  July 25, 1961  DATE OF PROCEDURE:  03/05/2014 DATE OF DISCHARGE:                              OPERATIVE REPORT   PREOPERATIVE DIAGNOSIS:  Left lower lobe nodule.  POSTOPERATIVE DIAGNOSIS:  Left lower lobe nodule, malignancy of unknown etiology.  PROCEDURE:  Left video-assisted thoracoscopy, wedge resection of left lower lobe nodule, lymph node sampling.  SURGEON:  Revonda Standard. Roxan Hockey, M.D.  ASSISTANT:  John Giovanni, PA-C  ANESTHESIA:  General.  FINDINGS:  5 mm nodule in superior segment of left lower lobe.  Frozen revealed malignancy.  Margins were negative for tumor, enlarged level 11 nodes. Relatively normal-appearing level 5 and 9 lymph nodes.  CLINICAL NOTE:  [name] [name] is a 53 year old woman with a history of tobacco abuse. She recently had a craniotomy for a brain tumor which could not be definitively identified.  A PET scan showed a 5 mm nodule in the left lower lobe which had an SUV of 1.9.  She was advised to undergo surgical resection for both diagnostic and therapeutic purposes.  The indications, risks, benefits, and alternatives were discussed in detail with the patient.  She understood and accepted the risks and agreed to proceed.  OPERATIVE NOTE:  [name] [name] was brought to the preoperative holding area on March 05, 2014, there Anesthesia placed a central line and an arterial blood pressure monitoring line.  She was taken to the operating room, anesthetized, and intubated with a double-lumen endotracheal tube. Intravenous antibiotics were administered.  A Foley catheter was placed. Sequential compression devices were placed on the legs for DVT prophylaxis.  She was placed in a right lateral decubitus position and the left chest was prepped and draped in the  usual sterile fashion. Single lung ventilation of the right lung was initiated and was tolerated well throughout the procedure.  An incision was made in approximately the seventh intercostal space in the midaxillary line and was carried through the skin and subcutaneous tissue.  A 5 mm port was inserted and the 5 mm thoracoscope was passed through the port.  There was good isolation of the left lung, although it was relatively slow to deflate.  A small working incision was made in the fourth interspace anterolaterally.  No rib spreading was performed.  Initial inspection revealed no pleural effusion.  There was no abnormality of the visceral or parietal pleura.  Palpation of the superior segment of the left lower lobe revealed a 5 mm nodule correlating to the CT findings.  A wedge resection was performed with sequential firings of an endoscopic GIA stapler.  The specimen was placed into an endoscopic retrieval bag and removed and sent for frozen section.  While awaiting the frozen section results, the inferior ligament was divided.  A level 9 lymph node was identified.  It was taken and sent for permanent Pathology.  The fissure was relatively complete.  There was an enlarged level 11 nodes near the takeoff of the superior segmental arterial branch.  This node was removed and sent for  permanent section.  The pleura overlying the aortopulmonary window was incised. There was a node relatively adherent to the pulmonary artery. Biopsies were taken from the node.  The entire node was not removed.  At this point, the frozen section returned with malignancy of unknown origin.  A final inspection was made for hemostasis.  The chest was irrigated with warm saline.  A test inflation revealed no significant air leak.  A 28-French chest tube was placed through the original port incision and secured with #1 silk suture.  The left lung was reinflated.  The incision was closed in 3 layers in the  standard fashion.  The chest tube was placed to suction.  The patient was placed back in the supine position.  She then was extubated and taken to the postanesthetic care unit in good condition.     Revonda Standard Roxan Hockey, M.D.     SCH/MEDQ  D:  03/07/2014  T:  03/07/2014  Job:  [id number]

## 2014-03-08 ENCOUNTER — Inpatient Hospital Stay (HOSPITAL_COMMUNITY): Payer: 59

## 2014-03-08 LAB — CBC
HCT: 37.4 % (ref 36.0–46.0)
Hemoglobin: 12.6 g/dL (ref 12.0–15.0)
MCH: 32.6 pg (ref 26.0–34.0)
MCHC: 33.7 g/dL (ref 30.0–36.0)
MCV: 96.6 fL (ref 78.0–100.0)
PLATELETS: 290 10*3/uL (ref 150–400)
RBC: 3.87 MIL/uL (ref 3.87–5.11)
RDW: 13.3 % (ref 11.5–15.5)
WBC: 6.2 10*3/uL (ref 4.0–10.5)

## 2014-03-08 LAB — BASIC METABOLIC PANEL
BUN: 4 mg/dL — ABNORMAL LOW (ref 6–23)
CO2: 27 meq/L (ref 19–32)
CREATININE: 0.42 mg/dL — AB (ref 0.50–1.10)
Calcium: 9.6 mg/dL (ref 8.4–10.5)
Chloride: 102 mEq/L (ref 96–112)
GFR calc non Af Amer: >90 mL/min (ref >90)
Glucose, Bld: 87 mg/dL (ref 70–99)
Potassium: 3.5 mEq/L — ABNORMAL LOW (ref 3.7–5.3)
Sodium: 141 mEq/L (ref 137–147)

## 2014-03-08 MED ORDER — IBUPROFEN 600 MG PO TABS
600.0000 mg | ORAL_TABLET | Freq: Four times a day (QID) | ORAL | Status: DC | PRN
  Administered 2014-03-08 – 2014-03-09 (×2): 600 mg via ORAL
  Filled 2014-03-08 (×3): qty 1

## 2014-03-08 NOTE — Progress Notes (Addendum)
      [address]       [place] [address]             [phone]      3 Days Post-Op Procedure(s) (LRB): VIDEO ASSISTED THORACOSCOPY (VATS)/WEDGE RESECTION (Left)  Subjective:  [name] [name] states she doesn't feel that great this morning.  She is having a little bit of pain and states her left arm is sore where the IV was removed.  Objective: Vital signs in last 24 hours: Temp:  [97.5 F (36.4 C)-98.4 F (36.9 C)] 97.9 F (36.6 C) (04/11 0700) Pulse Rate:  [76-89] 89 (04/10 1441) Cardiac Rhythm:  [-] Normal sinus rhythm (04/10 2030) Resp:  [16-21] 21 (04/10 1441) BP: (120-133)/(66-72) 133/66 mmHg (04/10 1441) SpO2:  [97 %-98 %] 97 % (04/10 1441)  Intake/Output from previous day: 04/10 0701 - 04/11 0700 In: 1364.5 [P.O.:960; I.V.:404.5] Out: 250 [Urine:250]  General appearance: alert, cooperative and no distress Heart: regular rate and rhythm Lungs: clear to auscultation bilaterally Abdomen: soft, non-tender; bowel sounds normal; no masses,  no organomegaly Extremities: LUE very mild phlebitis, no erythema present Wound: clean and dry  Lab Results:  Recent Labs  03/07/14 0450 03/08/14 0500  WBC 7.2 6.2  HGB 11.9* 12.6  HCT 35.5* 37.4  PLT 293 290   BMET:  Recent Labs  03/07/14 0450 03/08/14 0500  NA 141 141  K 3.2* 3.5*  CL 102 102  CO2 25 27  GLUCOSE 86 87  BUN 4* 4*  CREATININE 0.44* 0.42*  CALCIUM 9.0 9.6    PT/INR: No results found for this basename: LABPROT, INR,  in the last 72 hours ABG    Component Value Date/Time   PHART 7.404 03/06/2014 0536   HCO3 25.0* 03/06/2014 0536   TCO2 26.3 03/06/2014 0536   ACIDBASEDEF 0.5 03/03/2014 1352   O2SAT 99.8 03/06/2014 0536   CBG (last 3)   Recent Labs  03/05/14 2228 03/06/14 0739  GLUCAP 159* 109*    Assessment/Plan: S/P Procedure(s) (LRB): VIDEO ASSISTED THORACOSCOPY (VATS)/WEDGE RESECTION (Left)  1. Chest tube- removed without difficulty yesterday, no evidence of pneumothorax  present 2. Left Upper Extremity Phlebitis- continue warm compresses, will add Ibuprofen 3. Pain control- good with Dilaudid 4. Pathology is positive for Melanoma 5. Dispo- patient is stable, will plan to d/c once patient feels better   LOS: 3 days    Ellwood Handler 03/08/2014  Still with nausea, not able to take solid food yet. I have seen and examined [name] [name] and agree with the above assessment  and plan.  Grace Isaac MD Beeper [phone] Office [phone] 03/08/2014 1:03 PM

## 2014-03-09 MED ORDER — HYDROMORPHONE HCL PF 1 MG/ML IJ SOLN
1.0000 mg | INTRAMUSCULAR | Status: DC | PRN
  Administered 2014-03-09 – 2014-03-10 (×2): 1 mg via INTRAVENOUS
  Filled 2014-03-09 (×2): qty 1

## 2014-03-09 NOTE — Progress Notes (Addendum)
      [address]       [place] [address]             [phone]      4 Days Post-Op Procedure(s) (LRB): VIDEO ASSISTED THORACOSCOPY (VATS)/WEDGE RESECTION (Left)  Subjective:  [name] [name] is feeling better today.  Her nausea has improved and she had a regular dinner last night.  She has ordered a regular breakfast this morning which has not come.  She continues to have some pain, for which she states she is making sure she takes pain medication around the clock because she does not want it to wear off.  I encouraged the patient to only use pain medication when in pain, as she can not be discharged on IV medication.  She was agreeable to this.   Objective: Vital signs in last 24 hours: Temp:  [97.8 F (36.6 C)-98.2 F (36.8 C)] 97.9 F (36.6 C) (04/12 0700) Pulse Rate:  [75-94] 94 (04/12 0416) Cardiac Rhythm:  [-] Normal sinus rhythm (04/12 0416) Resp:  [14-22] 17 (04/12 0416) BP: (120-127)/(53-83) 122/71 mmHg (04/12 0416) SpO2:  [94 %-97 %] 97 % (04/12 0416)  Intake/Output from previous day: 04/11 0701 - 04/12 0700 In: 220 [I.V.:220] Out: -   General appearance: alert, cooperative and no distress Heart: regular rate and rhythm Lungs: clear to auscultation bilaterally Abdomen: soft, non-tender; bowel sounds normal; no masses,  no organomegaly Wound: clean and dry  Lab Results:  Recent Labs  03/07/14 0450 03/08/14 0500  WBC 7.2 6.2  HGB 11.9* 12.6  HCT 35.5* 37.4  PLT 293 290   BMET:  Recent Labs  03/07/14 0450 03/08/14 0500  NA 141 141  K 3.2* 3.5*  CL 102 102  CO2 25 27  GLUCOSE 86 87  BUN 4* 4*  CREATININE 0.44* 0.42*  CALCIUM 9.0 9.6    PT/INR: No results found for this basename: LABPROT, INR,  in the last 72 hours ABG    Component Value Date/Time   PHART 7.404 03/06/2014 0536   HCO3 25.0* 03/06/2014 0536   TCO2 26.3 03/06/2014 0536   ACIDBASEDEF 0.5 03/03/2014 1352   O2SAT 99.8 03/06/2014 0536   CBG (last 3)  No results found for  this basename: GLUCAP,  in the last 72 hours  Assessment/Plan: S/P Procedure(s) (LRB): VIDEO ASSISTED THORACOSCOPY (VATS)/WEDGE RESECTION (Left)  1. Left Upper Extremity Phlebitis- minimal swelling, no erythema present, continue Ibuprofen, warm compresses 2. Pain control- continues to use IV and PO Dilaudid, encouraged patient to try and only use pills today, as she can not have IV medication at discharge 3. Pulm- no acute issues, encouraged use of IS 4. GI- nausea improving, tolerated solid foods last night 5. Melanoma 6. Dispo- patient progressing, if tolerates diet today, pain control achieved, likely d/c in AM   LOS: 4 days    Ellwood Handler 03/09/2014  Feels better today No BM yet but no vomiting for 24 hours I have seen and examined [name] [name] and agree with the above assessment  and plan.  Grace Isaac MD Beeper [phone] Office [phone] 03/09/2014 10:14 AM

## 2014-03-10 MED ORDER — POLYETHYLENE GLYCOL 3350 17 G PO PACK
17.0000 g | PACK | Freq: Every day | ORAL | Status: DC | PRN
  Administered 2014-03-10: 17 g via ORAL
  Filled 2014-03-10: qty 1

## 2014-03-10 MED ORDER — HYDROMORPHONE HCL 2 MG PO TABS: 2.0000 mg | ORAL_TABLET | Freq: Four times a day (QID) | ORAL | Status: DC | PRN

## 2014-03-10 NOTE — Progress Notes (Signed)
5 Days Post-Op Procedure(s) (LRB): VIDEO ASSISTED THORACOSCOPY (VATS)/WEDGE RESECTION (Left) Subjective: Nausea better, no BM yet  Objective: Vital signs in last 24 hours: Temp:  [97.7 F (36.5 C)-98.6 F (37 C)] 98.5 F (36.9 C) (04/13 0730) Pulse Rate:  [72-102] 90 (04/13 0730) Cardiac Rhythm:  [-] Normal sinus rhythm (04/13 0424) Resp:  [13-25] 17 (04/13 0730) BP: (110-137)/(47-78) 110/61 mmHg (04/13 0730) SpO2:  [94 %-97 %] 96 % (04/13 0730)  Hemodynamic parameters for last 24 hours:    Intake/Output from previous day: 04/12 0701 - 04/13 0700 In: 240 [P.O.:240] Out: -  Intake/Output this shift:    General appearance: alert and no distress Neurologic: intact Heart: regular rate and rhythm Lungs: clear to auscultation bilaterally Wound: clean and dry right forearm mottled, no induration or erythema  Lab Results:  Recent Labs  03/08/14 0500  WBC 6.2  HGB 12.6  HCT 37.4  PLT 290   BMET:  Recent Labs  03/08/14 0500  NA 141  K 3.5*  CL 102  CO2 27  GLUCOSE 87  BUN 4*  CREATININE 0.42*  CALCIUM 9.6    PT/INR: No results found for this basename: LABPROT, INR,  in the last 72 hours ABG    Component Value Date/Time   PHART 7.404 03/06/2014 0536   HCO3 25.0* 03/06/2014 0536   TCO2 26.3 03/06/2014 0536   ACIDBASEDEF 0.5 03/03/2014 1352   O2SAT 99.8 03/06/2014 0536   CBG (last 3)  No results found for this basename: GLUCAP,  in the last 72 hours  Assessment/Plan: S/P Procedure(s) (LRB): VIDEO ASSISTED THORACOSCOPY (VATS)/WEDGE RESECTION (Left) Plan for discharge: see discharge orders  POD # 5 wedge resection Pain remains an issue, but getting better Nausea improved but no BM yet Home later today if she has a BM She is getting feldene already so will dc ibuprofen   LOS: 5 days    [name] [name] 03/10/2014

## 2014-03-10 NOTE — Progress Notes (Signed)
Discharge teaching given to patient and husband. Belongings gathered. All questions answered.

## 2014-03-11 ENCOUNTER — Other Ambulatory Visit: Payer: Self-pay | Admitting: *Deleted

## 2014-03-11 ENCOUNTER — Telehealth: Payer: Self-pay | Admitting: *Deleted

## 2014-03-11 DIAGNOSIS — C7949 Secondary malignant neoplasm of other parts of nervous system: Principal | ICD-10-CM

## 2014-03-11 DIAGNOSIS — C7931 Secondary malignant neoplasm of brain: Secondary | ICD-10-CM

## 2014-03-11 NOTE — Telephone Encounter (Signed)
Received a phone call from Manuela Schwartz in [employer] today.  She stated pt was anxious and needed to understand next steps of treatment.  I spoke with Dr. Julien Nordmann and he requested a follow up appt with pt next week.  I will place POF and labs per his order.

## 2014-03-14 ENCOUNTER — Telehealth: Payer: Self-pay | Admitting: Internal Medicine

## 2014-03-14 NOTE — Telephone Encounter (Signed)
S/w the pt and she is aware of her appts on 03/18/2014.

## 2014-03-16 ENCOUNTER — Emergency Department (HOSPITAL_COMMUNITY)
Admission: EM | Admit: 2014-03-16 | Discharge: 2014-03-16 | Disposition: A | Discharge status: Home / Self Care | Payer: 59 | Attending: Emergency Medicine | Admitting: Emergency Medicine

## 2014-03-16 ENCOUNTER — Telehealth: Payer: Self-pay | Admitting: Thoracic Surgery (Cardiothoracic Vascular Surgery)

## 2014-03-16 ENCOUNTER — Emergency Department (HOSPITAL_COMMUNITY): Payer: 59

## 2014-03-16 ENCOUNTER — Encounter (HOSPITAL_COMMUNITY): Payer: Self-pay | Admitting: Emergency Medicine

## 2014-03-16 DIAGNOSIS — N209 Urinary calculus, unspecified: Secondary | ICD-10-CM

## 2014-03-16 DIAGNOSIS — Z87828 Personal history of other (healed) physical injury and trauma: Secondary | ICD-10-CM | POA: Insufficient documentation

## 2014-03-16 DIAGNOSIS — Z791 Long term (current) use of non-steroidal anti-inflammatories (NSAID): Secondary | ICD-10-CM | POA: Insufficient documentation

## 2014-03-16 DIAGNOSIS — Z87891 Personal history of nicotine dependence: Secondary | ICD-10-CM | POA: Insufficient documentation

## 2014-03-16 DIAGNOSIS — R109 Unspecified abdominal pain: Secondary | ICD-10-CM

## 2014-03-16 DIAGNOSIS — E039 Hypothyroidism, unspecified: Secondary | ICD-10-CM | POA: Insufficient documentation

## 2014-03-16 DIAGNOSIS — Z85118 Personal history of other malignant neoplasm of bronchus and lung: Secondary | ICD-10-CM | POA: Insufficient documentation

## 2014-03-16 DIAGNOSIS — Z3202 Encounter for pregnancy test, result negative: Secondary | ICD-10-CM | POA: Insufficient documentation

## 2014-03-16 DIAGNOSIS — Z9889 Other specified postprocedural states: Secondary | ICD-10-CM | POA: Insufficient documentation

## 2014-03-16 DIAGNOSIS — K219 Gastro-esophageal reflux disease without esophagitis: Secondary | ICD-10-CM | POA: Insufficient documentation

## 2014-03-16 DIAGNOSIS — F0390 Unspecified dementia without behavioral disturbance: Secondary | ICD-10-CM | POA: Insufficient documentation

## 2014-03-16 DIAGNOSIS — Z85841 Personal history of malignant neoplasm of brain: Secondary | ICD-10-CM | POA: Insufficient documentation

## 2014-03-16 DIAGNOSIS — Z79899 Other long term (current) drug therapy: Secondary | ICD-10-CM | POA: Insufficient documentation

## 2014-03-16 DIAGNOSIS — R112 Nausea with vomiting, unspecified: Secondary | ICD-10-CM

## 2014-03-16 LAB — CBC WITH DIFFERENTIAL/PLATELET
BASOS ABS: 0 10*3/uL (ref 0.0–0.1)
BASOS PCT: 0 % (ref 0–1)
Eosinophils Absolute: 0 10*3/uL (ref 0.0–0.7)
Eosinophils Relative: 0 % (ref 0–5)
HCT: 43.6 % (ref 36.0–46.0)
HEMOGLOBIN: 15.2 g/dL — AB (ref 12.0–15.0)
Lymphocytes Relative: 7 % — ABNORMAL LOW (ref 12–46)
Lymphs Abs: 0.7 10*3/uL (ref 0.7–4.0)
MCH: 32.5 pg (ref 26.0–34.0)
MCHC: 34.9 g/dL (ref 30.0–36.0)
MCV: 93.2 fL (ref 78.0–100.0)
MONOS PCT: 4 % (ref 3–12)
Monocytes Absolute: 0.4 10*3/uL (ref 0.1–1.0)
NEUTROS PCT: 89 % — AB (ref 43–77)
Neutro Abs: 9.8 10*3/uL — ABNORMAL HIGH (ref 1.7–7.7)
Platelets: 399 10*3/uL (ref 150–400)
RBC: 4.68 MIL/uL (ref 3.87–5.11)
RDW: 12.8 % (ref 11.5–15.5)
WBC: 11 10*3/uL — ABNORMAL HIGH (ref 4.0–10.5)

## 2014-03-16 LAB — COMPREHENSIVE METABOLIC PANEL
ALBUMIN: 4.1 g/dL (ref 3.5–5.2)
ALK PHOS: 103 U/L (ref 39–117)
ALT: 14 U/L (ref 0–35)
AST: 16 U/L (ref 0–37)
BILIRUBIN TOTAL: 0.8 mg/dL (ref 0.3–1.2)
BUN: 17 mg/dL (ref 6–23)
CHLORIDE: 94 meq/L — AB (ref 96–112)
CO2: 19 mEq/L (ref 19–32)
Calcium: 10.4 mg/dL (ref 8.4–10.5)
Creatinine, Ser: 0.56 mg/dL (ref 0.50–1.10)
GFR calc Af Amer: >90 mL/min (ref >90)
GFR calc non Af Amer: >90 mL/min (ref >90)
Glucose, Bld: 155 mg/dL — ABNORMAL HIGH (ref 70–99)
Potassium: 3.4 mEq/L — ABNORMAL LOW (ref 3.7–5.3)
Sodium: 137 mEq/L (ref 137–147)
Total Protein: 7.5 g/dL (ref 6.0–8.3)

## 2014-03-16 LAB — LIPASE, BLOOD: Lipase: 15 U/L (ref 11–59)

## 2014-03-16 LAB — URINALYSIS, ROUTINE W REFLEX MICROSCOPIC
Bilirubin Urine: NEGATIVE
GLUCOSE, UA: NEGATIVE mg/dL
HGB URINE DIPSTICK: NEGATIVE
Ketones, ur: >80 mg/dL — AB
Leukocytes, UA: NEGATIVE
Nitrite: NEGATIVE
Protein, ur: NEGATIVE mg/dL
SPECIFIC GRAVITY, URINE: 1.021 (ref 1.005–1.030)
Urobilinogen, UA: 0.2 mg/dL (ref 0.0–1.0)
pH: 6 (ref 5.0–8.0)

## 2014-03-16 LAB — LACTIC ACID, PLASMA: LACTIC ACID, VENOUS: 1.1 mmol/L (ref 0.5–2.2)

## 2014-03-16 LAB — I-STAT TROPONIN, ED: Troponin i, poc: 0 ng/mL (ref 0.00–0.08)

## 2014-03-16 LAB — POC URINE PREG, ED: PREG TEST UR: NEGATIVE

## 2014-03-16 MED ORDER — METOCLOPRAMIDE HCL 5 MG/ML IJ SOLN
10.0000 mg | Freq: Once | INTRAMUSCULAR | Status: AC
  Administered 2014-03-16: 10 mg via INTRAVENOUS
  Filled 2014-03-16: qty 2

## 2014-03-16 MED ORDER — KETOROLAC TROMETHAMINE 15 MG/ML IJ SOLN
15.0000 mg | Freq: Once | INTRAMUSCULAR | Status: AC
  Administered 2014-03-16: 15 mg via INTRAVENOUS
  Filled 2014-03-16: qty 1

## 2014-03-16 MED ORDER — LORAZEPAM 2 MG/ML IJ SOLN
1.0000 mg | Freq: Once | INTRAMUSCULAR | Status: AC
  Administered 2014-03-16: 1 mg via INTRAVENOUS
  Filled 2014-03-16: qty 1

## 2014-03-16 MED ORDER — SODIUM CHLORIDE 0.9 % IV BOLUS (SEPSIS)
1000.0000 mL | Freq: Once | INTRAVENOUS | Status: AC
  Administered 2014-03-16: 1000 mL via INTRAVENOUS

## 2014-03-16 MED ORDER — HYDROMORPHONE HCL PF 1 MG/ML IJ SOLN
0.5000 mg | Freq: Once | INTRAMUSCULAR | Status: AC
  Administered 2014-03-16: 0.5 mg via INTRAVENOUS
  Filled 2014-03-16: qty 1

## 2014-03-16 MED ORDER — IBUPROFEN 600 MG PO TABS: 600.0000 mg | ORAL_TABLET | Freq: Four times a day (QID) | ORAL | Status: DC | PRN

## 2014-03-16 MED ORDER — ONDANSETRON HCL 4 MG/2ML IJ SOLN
4.0000 mg | Freq: Once | INTRAMUSCULAR | Status: AC
  Administered 2014-03-16: 4 mg via INTRAVENOUS
  Filled 2014-03-16: qty 2

## 2014-03-16 MED ORDER — PROMETHAZINE HCL 25 MG RE SUPP: 25.0000 mg | Freq: Four times a day (QID) | RECTAL | Status: DC | PRN

## 2014-03-16 MED ORDER — DIPHENHYDRAMINE HCL 50 MG/ML IJ SOLN
25.0000 mg | Freq: Once | INTRAMUSCULAR | Status: AC
  Administered 2014-03-16: 25 mg via INTRAVENOUS
  Filled 2014-03-16: qty 1

## 2014-03-16 MED ORDER — FENTANYL CITRATE 0.05 MG/ML IJ SOLN
50.0000 ug | Freq: Once | INTRAMUSCULAR | Status: AC
  Administered 2014-03-16: 50 ug via INTRAVENOUS
  Filled 2014-03-16: qty 2

## 2014-03-16 NOTE — Discharge Instructions (Signed)
Abdominal Pain, Adult Many things can cause abdominal pain. Usually, abdominal pain is not caused by a disease and will improve without treatment. It can often be observed and treated at home. Your health care provider will do a physical exam and possibly order blood tests and X-rays to help determine the seriousness of your pain. However, in many cases, more time must pass before a clear cause of the pain can be found. Before that point, your health care provider may not know if you need more testing or further treatment. HOME CARE INSTRUCTIONS  Monitor your abdominal pain for any changes. The following actions may help to alleviate any discomfort you are experiencing:  Only take over-the-counter or prescription medicines as directed by your health care provider.  Do not take laxatives unless directed to do so by your health care provider.  Try a clear liquid diet (broth, tea, or water) as directed by your health care provider. Slowly move to a bland diet as tolerated. SEEK MEDICAL CARE IF:  You have unexplained abdominal pain.  You have abdominal pain associated with nausea or diarrhea.  You have pain when you urinate or have a bowel movement.  You experience abdominal pain that wakes you in the night.  You have abdominal pain that is worsened or improved by eating food.  You have abdominal pain that is worsened with eating fatty foods. SEEK IMMEDIATE MEDICAL CARE IF:   Your pain does not go away within 2 hours.  You have a fever.  You keep throwing up (vomiting).  Your pain is felt only in portions of the abdomen, such as the right side or the left lower portion of the abdomen.  You pass bloody or black tarry stools. MAKE SURE YOU:  Understand these instructions.   Will watch your condition.   Will get help right away if you are not doing well or get worse.  Document Released: 08/24/2005 Document Revised: 09/04/2013 Document Reviewed: 07/24/2013 [facility] Patient  Information 2014 [place]. Nausea and Vomiting Nausea is a sick feeling that often comes before throwing up (vomiting). Vomiting is a reflex where stomach contents come out of your mouth. Vomiting can cause severe loss of body fluids (dehydration). Children and elderly adults can become dehydrated quickly, especially if they also have diarrhea. Nausea and vomiting are symptoms of a condition or disease. It is important to find the cause of your symptoms. CAUSES   Direct irritation of the stomach lining. This irritation can result from increased acid production (gastroesophageal reflux disease), infection, food poisoning, taking certain medicines (such as nonsteroidal anti-inflammatory drugs), alcohol use, or tobacco use.  Signals from the brain.These signals could be caused by a headache, heat exposure, an inner ear disturbance, increased pressure in the brain from injury, infection, a tumor, or a concussion, pain, emotional stimulus, or metabolic problems.  An obstruction in the gastrointestinal tract (bowel obstruction).  Illnesses such as diabetes, hepatitis, gallbladder problems, appendicitis, kidney problems, cancer, sepsis, atypical symptoms of a heart attack, or eating disorders.  Medical treatments such as chemotherapy and radiation.  Receiving medicine that makes you sleep (general anesthetic) during surgery. DIAGNOSIS Your caregiver may ask for tests to be done if the problems do not improve after a few days. Tests may also be done if symptoms are severe or if the reason for the nausea and vomiting is not clear. Tests may include:  Urine tests.  Blood tests.  Stool tests.  Cultures (to look for evidence of infection).  X-rays or other  imaging studies. Test results can help your caregiver make decisions about treatment or the need for additional tests. TREATMENT You need to stay well hydrated. Drink frequently but in small amounts.You may wish to drink water, sports  drinks, clear broth, or eat frozen ice pops or gelatin dessert to help stay hydrated.When you eat, eating slowly may help prevent nausea.There are also some antinausea medicines that may help prevent nausea. HOME CARE INSTRUCTIONS   Take all medicine as directed by your caregiver.  If you do not have an appetite, do not force yourself to eat. However, you must continue to drink fluids.  If you have an appetite, eat a normal diet unless your caregiver tells you differently.  Eat a variety of complex carbohydrates (rice, wheat, potatoes, bread), lean meats, yogurt, fruits, and vegetables.  Avoid high-fat foods because they are more difficult to digest.  Drink enough water and fluids to keep your urine clear or pale yellow.  If you are dehydrated, ask your caregiver for specific rehydration instructions. Signs of dehydration may include:  Severe thirst.  Dry lips and mouth.  Dizziness.  Dark urine.  Decreasing urine frequency and amount.  Confusion.  Rapid breathing or pulse. SEEK IMMEDIATE MEDICAL CARE IF:   You have blood or brown flecks (like coffee grounds) in your vomit.  You have black or bloody stools.  You have a severe headache or stiff neck.  You are confused.  You have severe abdominal pain.  You have chest pain or trouble breathing.  You do not urinate at least once every 8 hours.  You develop cold or clammy skin.  You continue to vomit for longer than 24 to 48 hours.  You have a fever. MAKE SURE YOU:   Understand these instructions.  Will watch your condition.  Will get help right away if you are not doing well or get worse. Document Released: 11/14/2005 Document Revised: 02/06/2012 Document Reviewed: 04/13/2011 [facility] Patient Information 2014 [place], [place]. Kidney Stones Kidney stones (urolithiasis) are deposits that form inside your kidneys. The intense pain is caused by the stone moving through the urinary tract. When the stone moves,  the ureter goes into spasm around the stone. The stone is usually passed in the urine.  CAUSES   A disorder that makes certain neck glands produce too much parathyroid hormone (primary hyperparathyroidism).  A buildup of uric acid crystals, similar to gout in your joints.  Narrowing (stricture) of the ureter.  A kidney obstruction present at birth (congenital obstruction).  Previous surgery on the kidney or ureters.  Numerous kidney infections. SYMPTOMS   Feeling sick to your stomach (nauseous).  Throwing up (vomiting).  Blood in the urine (hematuria).  Pain that usually spreads (radiates) to the groin.  Frequency or urgency of urination. DIAGNOSIS   Taking a history and physical exam.  Blood or urine tests.  CT scan.  Occasionally, an examination of the inside of the urinary bladder (cystoscopy) is performed. TREATMENT   Observation.  Increasing your fluid intake.  Extracorporeal shock wave lithotripsy This is a noninvasive procedure that uses shock waves to break up kidney stones.  Surgery may be needed if you have severe pain or persistent obstruction. There are various surgical procedures. Most of the procedures are performed with the use of small instruments. Only small incisions are needed to accommodate these instruments, so recovery time is minimized. The size, location, and chemical composition are all important variables that will determine the proper choice of action for you. Talk to  your health care provider to better understand your situation so that you will minimize the risk of injury to yourself and your kidney.  HOME CARE INSTRUCTIONS   Drink enough water and fluids to keep your urine clear or pale yellow. This will help you to pass the stone or stone fragments.  Strain all urine through the provided strainer. Keep all particulate matter and stones for your health care provider to see. The stone causing the pain may be as small as a grain of salt. It  is very important to use the strainer each and every time you pass your urine. The collection of your stone will allow your health care provider to analyze it and verify that a stone has actually passed. The stone analysis will often identify what you can do to reduce the incidence of recurrences.  Only take over-the-counter or prescription medicines for pain, discomfort, or fever as directed by your health care provider.  Make a follow-up appointment with your health care provider as directed.  Get follow-up X-rays if required. The absence of pain does not always mean that the stone has passed. It may have only stopped moving. If the urine remains completely obstructed, it can cause loss of kidney function or even complete destruction of the kidney. It is your responsibility to make sure X-rays and follow-ups are completed. Ultrasounds of the kidney can show blockages and the status of the kidney. Ultrasounds are not associated with any radiation and can be performed easily in a matter of minutes. SEEK MEDICAL CARE IF:  You experience pain that is progressive and unresponsive to any pain medicine you have been prescribed. SEEK IMMEDIATE MEDICAL CARE IF:   Pain cannot be controlled with the prescribed medicine.  You have a fever or shaking chills.  The severity or intensity of pain increases over 18 hours and is not relieved by pain medicine.  You develop a new onset of abdominal pain.  You feel faint or pass out.  You are unable to urinate. MAKE SURE YOU:   Understand these instructions.  Will watch your condition.  Will get help right away if you are not doing well or get worse. Document Released: 11/14/2005 Document Revised: 07/17/2013 Document Reviewed: 04/17/2013 [facility] Patient Information 2014 [place].

## 2014-03-16 NOTE — ED Provider Notes (Signed)
CSN: [id number]     Arrival date & time 03/16/14  1415 History   First MD Initiated Contact with Patient 03/16/14 1511     Chief Complaint  Patient presents with  . Abdominal Pain  . Emesis     (Consider location/radiation/quality/duration/timing/severity/associated sxs/prior Treatment) HPI  This is a 52 y.o. female, with past medical history of unidentified (thought to be malignant) brain cancer, status post craniectomy in December of last year, malignant melanoma of the left lower lung lobe status post VATS procedure, wedge resection 11 days ago, urolithiasis, appendicitis sp appendectomy, presenting today with continued nausea, vomiting. Onset today of operation. Improved on the day of discharge, but has worsened since yesterday. Nonbilious, nonbloody. Not alleviated with home Phenergan. Aggravated with by mouth intake. Positive for abdominal pain starting at an unknown time. She feels as though it might have started around the time of her surgery. Located right lower quadrant. Persistent. Throbbing, sharp. Not alleviated with dilaudid at home.  Radiates to groin. Positive for diarrhea. Negative for hematochezia, melena, vaginal discharge, vaginal bleeding, dysuria, hematuria.  Past Medical History  Diagnosis Date  . TMJ (dislocation of temporomandibular joint)   . Thyroid disease   . Lung cancer     awaiting pathology  . Brain cancer     awaiting pathology; assuming this is a lung primary with brain mets  . Dementia   . Hypothyroidism   . GERD (gastroesophageal reflux disease)   . Anxiety    Past Surgical History  Procedure Laterality Date  . Tonsillectomy    . Appendectomy    . Suboccipital craniectomy cervical laminectomy N/A 11/19/2013    Procedure: SUBOCCIPITAL CRANIECTOMY for tumor;  Surgeon: Eustace Moore, MD;  Location: [facility];  Service: Neurosurgery;  Laterality: N/A;  . Lumbar wound debridement N/A 12/05/2013    Procedure: irrigation and debridement posterior  cervical wound;  Surgeon: Eustace Moore, MD;  Location: [place];  Service: Neurosurgery;  Laterality: N/A;  . Video assisted thoracoscopy (vats)/wedge resection Left 03/05/2014    Procedure: VIDEO ASSISTED THORACOSCOPY (VATS)/WEDGE RESECTION;  Surgeon: Melrose Nakayama, MD;  Location: [facility];  Service: Thoracic;  Laterality: Left;   Family History  Problem Relation Age of Onset  . Dementia Mother    History  Substance Use Topics  . Smoking status: Former Smoker    Quit date: 11/19/2013  . Smokeless tobacco: Never Used  . Alcohol Use: No   OB History   Grav Para Term Preterm Abortions TAB SAB Ect Mult Living                 Review of Systems  Constitutional: Negative for fever and chills.  HENT: Negative for facial swelling.   Eyes: Negative for photophobia and pain.  Respiratory: Negative for cough and shortness of breath.   Cardiovascular: Negative for chest pain and leg swelling.  Gastrointestinal: Positive for nausea, vomiting and abdominal pain.  Genitourinary: Negative for dysuria.  Musculoskeletal: Negative for arthralgias.  Skin: Negative for rash and wound.  Neurological: Negative for seizures.  Hematological: Negative for adenopathy.      Allergies  Morphine and related  Home Medications   Prior to Admission medications   Medication Sig Start Date End Date Taking? Authorizing Provider  HYDROmorphone (DILAUDID) 2 MG tablet Take 1 tablet (2 mg total) by mouth every 6 (six) hours as needed for moderate pain or severe pain. 03/10/14   Wayne E Gold, PA-C  levothyroxine (SYNTHROID, LEVOTHROID) 125 MCG tablet Take  125 mcg by mouth daily before breakfast.    Historical Provider, MD  methocarbamol (ROBAXIN) 500 MG tablet Take 500 mg by mouth as needed for muscle spasms.    Historical Provider, MD  omeprazole (PRILOSEC) 10 MG capsule Take 10 mg by mouth daily.    Historical Provider, MD  pantoprazole (PROTONIX) 40 MG tablet Take 1 tablet (40 mg total) by mouth  daily. 02/21/14   Saddie Benders. Ghim, MD  piroxicam (FELDENE) 20 MG capsule Take 20 mg by mouth daily.    Historical Provider, MD  sucralfate (CARAFATE) 1 G tablet Take 1 g by mouth 4 (four) times daily.    Historical Provider, MD   BP 137/90  Pulse 70  Temp(Src) 97.7 F (36.5 C) (Oral)  Resp 24  Ht 5\' 7"  (1.702 m)  Wt 120 lb (54.432 kg)  BMI 18.79 kg/m2  SpO2 100% Physical Exam  Constitutional: She is oriented to person, place, and time. She appears well-developed and well-nourished. No distress.  HENT:  Head: Normocephalic and atraumatic.  Mouth/Throat: No oropharyngeal exudate.  Eyes: Conjunctivae are normal. Pupils are equal, round, and reactive to light. No scleral icterus.  Neck: Normal range of motion. No tracheal deviation present. No thyromegaly present.  Cardiovascular: Normal rate, regular rhythm and normal heart sounds.  Exam reveals no gallop and no friction rub.   No murmur heard. Pulmonary/Chest: Effort normal and breath sounds normal. No stridor. No respiratory distress. She has no wheezes. She has no rales. She exhibits no tenderness.  Abdominal: Soft. She exhibits no distension and no mass. There is tenderness (RLQ, suprapubic area). There is no rebound and no guarding.  Musculoskeletal: Normal range of motion. She exhibits no edema.  Neurological: She is alert and oriented to person, place, and time.  Skin: Skin is warm and dry. She is not diaphoretic.    ED Course  Procedures (including critical care time) Labs Review Labs Reviewed  CBC WITH DIFFERENTIAL - Abnormal; Notable for the following:    WBC 11.0 (*)    Hemoglobin 15.2 (*)    Neutrophils Relative % 89 (*)    Neutro Abs 9.8 (*)    Lymphocytes Relative 7 (*)    All other components within normal limits  COMPREHENSIVE METABOLIC PANEL - Abnormal; Notable for the following:    Potassium 3.4 (*)    Chloride 94 (*)    Glucose, Bld 155 (*)    All other components within normal limits  URINALYSIS, ROUTINE W  REFLEX MICROSCOPIC - Abnormal; Notable for the following:    APPearance CLOUDY (*)    Ketones, ur >80 (*)    All other components within normal limits  LIPASE, BLOOD  LACTIC ACID, PLASMA  I-STAT TROPOININ, ED  POC URINE PREG, ED    Imaging Review Dg Abd 1 View  03/16/2014   CLINICAL DATA:  Right-sided abdominal pain. Nausea and vomiting. Urolithiasis.  EXAM: ABDOMEN - 1 VIEW  COMPARISON:  None.  FINDINGS: The bowel gas pattern is normal. No radio-opaque calculi or other significant radiographic abnormality are seen. Mild iliac vascular calcification and pelvic phleboliths noted.  IMPRESSION: No acute findings.   Electronically Signed   By: Earle Gell M.D.   On: 03/16/2014 16:47   US Renal  03/16/2014   CLINICAL DATA:  Abdominal pain  EXAM: RENAL/URINARY TRACT ULTRASOUND COMPLETE  COMPARISON:  CT ABD/PELVIS W CM dated 01/25/2014  FINDINGS: Right Kidney:  Length: 11.3 cm in length. Stable mild right hydronephrosis. On the prior CT, right hydronephrosis,  delayed excretion of contrast, and a small distal ureteral calculus or noted. No renal mass. Normal cortical echogenicity.  Left Kidney:  Length: 11.5 cm in length. Echogenicity within normal limits. No mass or hydronephrosis visualized.  Bladder:  Ureteral jets were not identified.  Decompressed.  IMPRESSION: Stable right hydronephrosis.  See above discussion.   Electronically Signed   By: Maryclare Bean M.D.   On: 03/16/2014 17:57    MDM   Final diagnoses:  None    This is a 53 y.o. female, with past medical history of unidentified (thought to be malignant) brain cancer, status post craniectomy in December of last year, malignant melanoma of the left lower lung lobe status post VATS procedure, wedge resection 11 days ago, urolithiasis, appendicitis sp appendectomy, presenting today with continued nausea, vomiting. Onset today of operation. Improved on the day of discharge, but has worsened since yesterday. Nonbilious, nonbloody. Not alleviated with  home Phenergan. Aggravated with by mouth intake. Positive for abdominal pain starting at an unknown time. She feels as though it might have started around the time of her surgery. Located right lower quadrant. Persistent. Throbbing, sharp. Not alleviated with dilaudid at home.  Radiates to groin. Positive for diarrhea. Negative for hematochezia, melena, vaginal discharge, vaginal bleeding, dysuria, hematuria.  Exam as above, with tenderness to palpation of suprapubic, right lower quadrant areas. Negative for rebound, rigidity, guarding. Negative for CVA tenderness to palpation. Postcraniotomy, VATS incision sites are clean, well approximated, well healing, without signs of infection.  At this time, I doubt obstruction, perforation, ischemia, or emergent inflammation. I also doubt that intracranial etiology explains nausea, vomiting. Considering similarity in signs and symptoms in comparison to urolithiasis episode, I'm considering this strongly. This also might represent normal postoperative symptoms. Patient has no vaginal complaints. I do not believe that pelvic examination is indicated at this time. I have ordered ultrasound of the kidneys to ensure that there is no additional hydronephrosis. I've ordered a CBC, metabolic panel, pregnancy test, urinalysis, lactate, lipase. Will administer IV fluids, Reglan, Benadryl for nausea, vomiting. Will followup results, continue to monitor closely.  Urinalysis is without signs of infection.  Labs within normal limits. Specifically, patient has no evidence of acute kidney injury.  Ultrasound of the kidneys reveals stable mild right hydronephrosis, which was appreciated on CT scan about 2 months ago. I believe that the patient is passing her stone at this time.  Symptoms have been alleviated.  Examination remains stable. Abdomen remains soft. Pt stable for discharge with Rx for phenergan suppository, FU.  All questions answered.  Return precautions given.  I have  discussed case and care has been guided by my attending physician, Dr. Vanita Panda.   Doy Hutching, MD 03/17/14 [phone]

## 2014-03-16 NOTE — ED Notes (Signed)
Pt states understanding of discharge instructions 

## 2014-03-16 NOTE — ED Notes (Signed)
Pt arrived by [facility]. Pt appears very anxious. Reports mid abd pain. Had surgery on left lung and dc home on Monday. Having nausea since then but now n/v. Having freqeunt loose stools today. Received 197mcg fentanyl and zofran 4mg  pta with no relief.

## 2014-03-16 NOTE — ED Notes (Signed)
Patient transported to X-ray 

## 2014-03-16 NOTE — Telephone Encounter (Signed)
Patient called stating that she was in acute distress with pain and nausea.  She was advised to go to the emergency room at [facility] to be evaluated by the ED physician.  Rexene Alberts 03/16/2014 1:10 PM

## 2014-03-16 NOTE — ED Notes (Signed)
Patient transported to Ultrasound. Remains comfortable at this time.

## 2014-03-17 ENCOUNTER — Emergency Department (HOSPITAL_COMMUNITY): Payer: 59

## 2014-03-17 ENCOUNTER — Encounter (HOSPITAL_COMMUNITY): Payer: Self-pay | Admitting: Emergency Medicine

## 2014-03-17 ENCOUNTER — Telehealth: Payer: Self-pay | Admitting: *Deleted

## 2014-03-17 ENCOUNTER — Inpatient Hospital Stay (HOSPITAL_COMMUNITY)
Admission: EM | Admit: 2014-03-17 | Discharge: 2014-03-20 | DRG: 391 | Disposition: A | Discharge status: Home / Self Care | Payer: 59 | Attending: Internal Medicine | Admitting: Internal Medicine

## 2014-03-17 DIAGNOSIS — K529 Noninfective gastroenteritis and colitis, unspecified: Secondary | ICD-10-CM | POA: Diagnosis present

## 2014-03-17 DIAGNOSIS — Z681 Body mass index (BMI) 19 or less, adult: Secondary | ICD-10-CM

## 2014-03-17 DIAGNOSIS — Z87891 Personal history of nicotine dependence: Secondary | ICD-10-CM

## 2014-03-17 DIAGNOSIS — Z9889 Other specified postprocedural states: Secondary | ICD-10-CM

## 2014-03-17 DIAGNOSIS — C7949 Secondary malignant neoplasm of other parts of nervous system: Secondary | ICD-10-CM

## 2014-03-17 DIAGNOSIS — E039 Hypothyroidism, unspecified: Secondary | ICD-10-CM | POA: Diagnosis present

## 2014-03-17 DIAGNOSIS — T8149XA Infection following a procedure, other surgical site, initial encounter: Secondary | ICD-10-CM

## 2014-03-17 DIAGNOSIS — Z85118 Personal history of other malignant neoplasm of bronchus and lung: Secondary | ICD-10-CM

## 2014-03-17 DIAGNOSIS — C7931 Secondary malignant neoplasm of brain: Secondary | ICD-10-CM | POA: Diagnosis present

## 2014-03-17 DIAGNOSIS — E43 Unspecified severe protein-calorie malnutrition: Secondary | ICD-10-CM | POA: Insufficient documentation

## 2014-03-17 DIAGNOSIS — B370 Candidal stomatitis: Secondary | ICD-10-CM

## 2014-03-17 DIAGNOSIS — F411 Generalized anxiety disorder: Secondary | ICD-10-CM | POA: Diagnosis present

## 2014-03-17 DIAGNOSIS — F0390 Unspecified dementia without behavioral disturbance: Secondary | ICD-10-CM | POA: Diagnosis present

## 2014-03-17 DIAGNOSIS — E860 Dehydration: Secondary | ICD-10-CM

## 2014-03-17 DIAGNOSIS — K219 Gastro-esophageal reflux disease without esophagitis: Secondary | ICD-10-CM | POA: Diagnosis present

## 2014-03-17 DIAGNOSIS — R911 Solitary pulmonary nodule: Secondary | ICD-10-CM

## 2014-03-17 DIAGNOSIS — Z85820 Personal history of malignant melanoma of skin: Secondary | ICD-10-CM

## 2014-03-17 DIAGNOSIS — K5289 Other specified noninfective gastroenteritis and colitis: Principal | ICD-10-CM | POA: Diagnosis present

## 2014-03-17 DIAGNOSIS — G9389 Other specified disorders of brain: Secondary | ICD-10-CM | POA: Diagnosis present

## 2014-03-17 DIAGNOSIS — R112 Nausea with vomiting, unspecified: Secondary | ICD-10-CM

## 2014-03-17 DIAGNOSIS — Z720 Tobacco use: Secondary | ICD-10-CM

## 2014-03-17 DIAGNOSIS — Z79899 Other long term (current) drug therapy: Secondary | ICD-10-CM

## 2014-03-17 HISTORY — DX: Noninfective gastroenteritis and colitis, unspecified: K52.9

## 2014-03-17 LAB — COMPREHENSIVE METABOLIC PANEL
ALBUMIN: 4.1 g/dL (ref 3.5–5.2)
ALK PHOS: 99 U/L (ref 39–117)
ALT: 13 U/L (ref 0–35)
AST: 18 U/L (ref 0–37)
BILIRUBIN TOTAL: 0.8 mg/dL (ref 0.3–1.2)
BUN: 24 mg/dL — AB (ref 6–23)
CHLORIDE: 96 meq/L (ref 96–112)
CO2: 19 mEq/L (ref 19–32)
Calcium: 10.1 mg/dL (ref 8.4–10.5)
Creatinine, Ser: 0.51 mg/dL (ref 0.50–1.10)
GFR calc Af Amer: >90 mL/min (ref >90)
GFR calc non Af Amer: >90 mL/min (ref >90)
Glucose, Bld: 146 mg/dL — ABNORMAL HIGH (ref 70–99)
POTASSIUM: 3.3 meq/L — AB (ref 3.7–5.3)
SODIUM: 137 meq/L (ref 137–147)
TOTAL PROTEIN: 7.4 g/dL (ref 6.0–8.3)

## 2014-03-17 LAB — URINALYSIS, ROUTINE W REFLEX MICROSCOPIC
Glucose, UA: NEGATIVE mg/dL
Leukocytes, UA: NEGATIVE
NITRITE: NEGATIVE
PH: 5.5 (ref 5.0–8.0)
Protein, ur: 100 mg/dL — AB
SPECIFIC GRAVITY, URINE: 1.028 (ref 1.005–1.030)
Urobilinogen, UA: 0.2 mg/dL (ref 0.0–1.0)

## 2014-03-17 LAB — CBC WITH DIFFERENTIAL/PLATELET
BASOS PCT: 0 % (ref 0–1)
Basophils Absolute: 0 10*3/uL (ref 0.0–0.1)
Eosinophils Absolute: 0 10*3/uL (ref 0.0–0.7)
Eosinophils Relative: 0 % (ref 0–5)
HCT: 44 % (ref 36.0–46.0)
Hemoglobin: 15.7 g/dL — ABNORMAL HIGH (ref 12.0–15.0)
Lymphocytes Relative: 6 % — ABNORMAL LOW (ref 12–46)
Lymphs Abs: 0.9 10*3/uL (ref 0.7–4.0)
MCH: 32.6 pg (ref 26.0–34.0)
MCHC: 35.7 g/dL (ref 30.0–36.0)
MCV: 91.5 fL (ref 78.0–100.0)
Monocytes Absolute: 1.6 10*3/uL — ABNORMAL HIGH (ref 0.1–1.0)
Monocytes Relative: 9 % (ref 3–12)
NEUTROS ABS: 14.5 10*3/uL — AB (ref 1.7–7.7)
NEUTROS PCT: 85 % — AB (ref 43–77)
PLATELETS: 446 10*3/uL — AB (ref 150–400)
RBC: 4.81 MIL/uL (ref 3.87–5.11)
RDW: 12.8 % (ref 11.5–15.5)
WBC: 17 10*3/uL — ABNORMAL HIGH (ref 4.0–10.5)

## 2014-03-17 LAB — URINE MICROSCOPIC-ADD ON

## 2014-03-17 LAB — LIPASE, BLOOD: LIPASE: 23 U/L (ref 11–59)

## 2014-03-17 MED ORDER — PROMETHAZINE HCL 25 MG/ML IJ SOLN
25.0000 mg | Freq: Once | INTRAMUSCULAR | Status: AC
  Administered 2014-03-17: 25 mg via INTRAVENOUS
  Filled 2014-03-17: qty 1

## 2014-03-17 MED ORDER — HYDROMORPHONE HCL PF 1 MG/ML IJ SOLN
1.0000 mg | Freq: Once | INTRAMUSCULAR | Status: AC
  Administered 2014-03-17: 1 mg via INTRAVENOUS
  Filled 2014-03-17: qty 1

## 2014-03-17 MED ORDER — POTASSIUM CHLORIDE 10 MEQ/100ML IV SOLN
10.0000 meq | Freq: Once | INTRAVENOUS | Status: AC
  Administered 2014-03-17: 10 meq via INTRAVENOUS
  Filled 2014-03-17: qty 100

## 2014-03-17 MED ORDER — METOCLOPRAMIDE HCL 5 MG/ML IJ SOLN
10.0000 mg | Freq: Once | INTRAMUSCULAR | Status: AC
  Administered 2014-03-17: 10 mg via INTRAVENOUS
  Filled 2014-03-17: qty 2

## 2014-03-17 MED ORDER — SODIUM CHLORIDE 0.9 % IV BOLUS (SEPSIS)
2000.0000 mL | Freq: Once | INTRAVENOUS | Status: AC
  Administered 2014-03-17: 2000 mL via INTRAVENOUS

## 2014-03-17 MED ORDER — ONDANSETRON 4 MG PO TBDP
8.0000 mg | ORAL_TABLET | Freq: Once | ORAL | Status: AC
  Administered 2014-03-17: 8 mg via ORAL
  Filled 2014-03-17: qty 2

## 2014-03-17 MED ORDER — METRONIDAZOLE IN NACL 5-0.79 MG/ML-% IV SOLN
500.0000 mg | Freq: Once | INTRAVENOUS | Status: AC
  Administered 2014-03-17: 500 mg via INTRAVENOUS
  Filled 2014-03-17: qty 100

## 2014-03-17 MED ORDER — CIPROFLOXACIN IN D5W 400 MG/200ML IV SOLN
400.0000 mg | Freq: Once | INTRAVENOUS | Status: AC
  Administered 2014-03-17: 400 mg via INTRAVENOUS
  Filled 2014-03-17: qty 200

## 2014-03-17 NOTE — ED Provider Notes (Signed)
CSN: [id number]     Arrival date & time 03/17/14  1703 History   First MD Initiated Contact with Patient 03/17/14 1949     Chief Complaint  Patient presents with  . Emesis     (Consider location/radiation/quality/duration/timing/severity/associated sxs/prior Treatment) The history is provided by the patient.  [name] [name] is a 53 y.o. female hx of lung Ca s/p recent VATS, brain ca s/p surgery, melanoma here with vomiting. Since her surgery 11 days ago, she was feeling nauseous and was unable to keep anything. Came here yesterday, had US renal that showed R hydro. Sent home but went to urology and noted no kidney stone. But she has been vomiting and unable to keep anything down so was sent here for evaluation. Has some diffuse abdominal pain, worse on the right side. Has some diarrhea as well.    Past Medical History  Diagnosis Date  . TMJ (dislocation of temporomandibular joint)   . Thyroid disease   . Lung cancer     awaiting pathology  . Brain cancer     awaiting pathology; assuming this is a lung primary with brain mets  . Dementia   . Hypothyroidism   . GERD (gastroesophageal reflux disease)   . Anxiety    Past Surgical History  Procedure Laterality Date  . Tonsillectomy    . Appendectomy    . Suboccipital craniectomy cervical laminectomy N/A 11/19/2013    Procedure: SUBOCCIPITAL CRANIECTOMY for tumor;  Surgeon: Eustace Moore, MD;  Location: [facility];  Service: Neurosurgery;  Laterality: N/A;  . Lumbar wound debridement N/A 12/05/2013    Procedure: irrigation and debridement posterior cervical wound;  Surgeon: Eustace Moore, MD;  Location: [facility];  Service: Neurosurgery;  Laterality: N/A;  . Video assisted thoracoscopy (vats)/wedge resection Left 03/05/2014    Procedure: VIDEO ASSISTED THORACOSCOPY (VATS)/WEDGE RESECTION;  Surgeon: Melrose Nakayama, MD;  Location: [facility];  Service: Thoracic;  Laterality: Left;   Family History  Problem Relation Age of Onset  .  Dementia Mother    History  Substance Use Topics  . Smoking status: Former Smoker    Quit date: 11/19/2013  . Smokeless tobacco: Never Used  . Alcohol Use: No   OB History   Grav Para Term Preterm Abortions TAB SAB Ect Mult Living                 Review of Systems  Gastrointestinal: Positive for vomiting and abdominal pain.  All other systems reviewed and are negative.     Allergies  Morphine and related  Home Medications   Prior to Admission medications   Medication Sig Start Date End Date Taking? Authorizing Provider  HYDROmorphone (DILAUDID) 2 MG tablet Take 1 tablet (2 mg total) by mouth every 6 (six) hours as needed for moderate pain or severe pain. 03/10/14   Wayne E Gold, PA-C  ibuprofen (ADVIL,MOTRIN) 600 MG tablet Take 1 tablet (600 mg total) by mouth every 6 (six) hours as needed. 03/16/14   Doy Hutching, MD  levothyroxine (SYNTHROID, LEVOTHROID) 125 MCG tablet Take 125 mcg by mouth daily before breakfast.    Historical Provider, MD  methocarbamol (ROBAXIN) 500 MG tablet Take 500 mg by mouth as needed for muscle spasms.    Historical Provider, MD  omeprazole (PRILOSEC) 10 MG capsule Take 10 mg by mouth daily.    Historical Provider, MD  pantoprazole (PROTONIX) 40 MG tablet Take 1 tablet (40 mg total) by mouth daily. 02/21/14   Legrand Como  Dallie Dad, MD  piroxicam (FELDENE) 20 MG capsule Take 20 mg by mouth daily.    Historical Provider, MD  promethazine (PHENERGAN) 25 MG suppository Place 1 suppository (25 mg total) rectally every 6 (six) hours as needed for nausea or vomiting. 03/16/14   Doy Hutching, MD  sucralfate (CARAFATE) 1 G tablet Take 1 g by mouth 4 (four) times daily.    Historical Provider, MD   BP 102/56  Pulse 96  Temp(Src) 97.9 F (36.6 C)  Resp 18  SpO2 98% Physical Exam  Nursing note and vitals reviewed. Constitutional: She is oriented to person, place, and time.  Chronically ill, dehydrated   HENT:  Head: Normocephalic.  MM dry   Eyes:  Conjunctivae and EOM are normal. Pupils are equal, round, and reactive to light.  Neck: Normal range of motion. Neck supple.  Cardiovascular: Normal rate, regular rhythm and normal heart sounds.   Pulmonary/Chest: Effort normal and breath sounds normal. No respiratory distress. She has no wheezes. She has no rales.  Abdominal: Soft. Bowel sounds are normal.  Mild RLQ tenderness, mild R CVAT   Musculoskeletal: Normal range of motion. She exhibits no edema and no tenderness.  Neurological: She is alert and oriented to person, place, and time. No cranial nerve deficit. Coordination normal.  Skin: Skin is dry.  Psychiatric: She has a normal mood and affect. Her behavior is normal. Judgment and thought content normal.    ED Course  Procedures (including critical care time) Labs Review Labs Reviewed  CBC WITH DIFFERENTIAL - Abnormal; Notable for the following:    WBC 17.0 (*)    Hemoglobin 15.7 (*)    Platelets 446 (*)    Neutrophils Relative % 85 (*)    Neutro Abs 14.5 (*)    Lymphocytes Relative 6 (*)    Monocytes Absolute 1.6 (*)    All other components within normal limits  COMPREHENSIVE METABOLIC PANEL - Abnormal; Notable for the following:    Potassium 3.3 (*)    Glucose, Bld 146 (*)    BUN 24 (*)    All other components within normal limits  URINALYSIS, ROUTINE W REFLEX MICROSCOPIC - Abnormal; Notable for the following:    APPearance CLOUDY (*)    Hgb urine dipstick MODERATE (*)    Bilirubin Urine SMALL (*)    Ketones, ur >80 (*)    Protein, ur 100 (*)    All other components within normal limits  URINE MICROSCOPIC-ADD ON - Abnormal; Notable for the following:    Squamous Epithelial / LPF FEW (*)    Bacteria, UA FEW (*)    Casts HYALINE CASTS (*)    All other components within normal limits  LIPASE, BLOOD    Imaging Review Ct Abdomen Pelvis Wo Contrast  03/17/2014   CLINICAL DATA:  53 year old female with abdominal and pelvic pain, vomiting and diarrhea.  EXAM: CT  ABDOMEN AND PELVIS WITHOUT CONTRAST  TECHNIQUE: Multidetector CT imaging of the abdomen and pelvis was performed following the standard protocol without intravenous contrast.  COMPARISON:  Study earlier today. It was unknown that the patient had an earlier exam this day.  FINDINGS: Mild circumferential wall thickening and adjacent inflammation of the descending colon is noted compatible with a mild colitis.  There is no evidence of bowel obstruction, pneumoperitoneum or abscess.  The liver, spleen, adrenal glands, pancreas and gallbladder are unremarkable.  Mild right hydronephrosis is again identified but decreased since 01/25/2014.  A punctate nonobstructing mid left renal calculus is present.  Please note that parenchymal abnormalities may be missed without intravenous contrast.  There is no evidence of free fluid, enlarged lymph nodes, biliary dilation or abdominal aortic aneurysm.  The bladder is within normal limits.  No acute or suspicious bony abnormalities are identified.  IMPRESSION: Mild colitis of the descending colon - likely inflammatory or infectious. No evidence of bowel obstruction, pneumoperitoneum or abscess.  Mild right hydronephrosis again identified, but decreased from 01/25/2014.  Punctate nonobstructing left renal calculus.   Electronically Signed   By: Hassan Rowan M.D.   On: 03/17/2014 21:23   Dg Chest 2 View  03/17/2014   CLINICAL DATA:  Chest pain. History of lung cancer resection on 03/05/2014.  EXAM: CHEST  2 VIEW  COMPARISON:  03/08/2014 and prior chest radiographs. 01/02/2014 PET-CT.  FINDINGS: The cardiomediastinal silhouette is unremarkable.  Postsurgical changes and mild scarring within the left lung noted.  There is no evidence of focal airspace disease, pulmonary edema, suspicious pulmonary nodule/mass, pleural effusion, or pneumothorax. No acute bony abnormalities are identified.  IMPRESSION: No active cardiopulmonary disease.   Electronically Signed   By: Hassan Rowan M.D.   On:  03/17/2014 21:42   Dg Abd 1 View  03/16/2014   CLINICAL DATA:  Right-sided abdominal pain. Nausea and vomiting. Urolithiasis.  EXAM: ABDOMEN - 1 VIEW  COMPARISON:  None.  FINDINGS: The bowel gas pattern is normal. No radio-opaque calculi or other significant radiographic abnormality are seen. Mild iliac vascular calcification and pelvic phleboliths noted.  IMPRESSION: No acute findings.   Electronically Signed   By: Earle Gell M.D.   On: 03/16/2014 16:47   US Renal  03/16/2014   CLINICAL DATA:  Abdominal pain  EXAM: RENAL/URINARY TRACT ULTRASOUND COMPLETE  COMPARISON:  CT ABD/PELVIS W CM dated 01/25/2014  FINDINGS: Right Kidney:  Length: 11.3 cm in length. Stable mild right hydronephrosis. On the prior CT, right hydronephrosis, delayed excretion of contrast, and a small distal ureteral calculus or noted. No renal mass. Normal cortical echogenicity.  Left Kidney:  Length: 11.5 cm in length. Echogenicity within normal limits. No mass or hydronephrosis visualized.  Bladder:  Ureteral jets were not identified.  Decompressed.  IMPRESSION: Stable right hydronephrosis.  See above discussion.   Electronically Signed   By: Maryclare Bean M.D.   On: 03/16/2014 17:57     EKG Interpretation None      MDM   Final diagnoses:  None    [name] [name] is a 53 y.o. female here with nausea, vomiting. She appears dehydrated and unable to keep anything down. Will get CT to r/o stone vs SBO. Will hydrate and likely admit for dehydration.   9:46 PM Given cipro, flagyl. Will admit. Hospitalist request adding c diff.     Wandra Arthurs, MD 03/17/14 2147

## 2014-03-17 NOTE — H&P (Signed)
PCP: Orpah Melter, MD  [facility]   Chief Complaint:  Nausea and vomiting  HPI: [name] [name] is a 53 y.o. female   has a past medical history of TMJ (dislocation of temporomandibular joint); Thyroid disease; Lung cancer; Brain cancer; Dementia; Hypothyroidism; GERD (gastroesophageal reflux disease); and Anxiety.   Presented with  Patient sp craniectomy complicated by staph infection for solitary brain met and lung resection on 4/08 was discharged on 4/13 and started to develop nausea and vomitng with few episodes of diarrhea no blood in stool. Reports fever today 100.2  But patient have been vomiting for the past 1 week with very little PO intake and minimal abdominal discomfort. CT of the abdomen done today showed colitis. Hospitals[facility] called for admissin  Review of Systems:     Pertinent positives include: Fevers, chills, abdominal pain, nausea, vomiting, diarrhea,   Constitutional:  No weight loss, night sweats, , fatigue, weight loss  HEENT:  No headaches, Difficulty swallowing,Tooth/dental problems,Sore throat,  No sneezing, itching, ear ache, nasal congestion, post nasal drip,  Cardio-vascular:  No chest pain, Orthopnea, PND, anasarca, dizziness, palpitations.no Bilateral lower extremity swelling  GI:  No heartburn, indigestion,  change in bowel habits, loss of appetite, melena, blood in stool, hematemesis Resp:  no shortness of breath at rest. No dyspnea on exertion, No excess mucus, no productive cough, No non-productive cough, No coughing up of blood.No change in color of mucus.No wheezing. Skin:  no rash or lesions. No jaundice GU:  no dysuria, change in color of urine, no urgency or frequency. No straining to urinate.  No flank pain.  Musculoskeletal:  No joint pain or no joint swelling. No decreased range of motion. No back pain.  Psych:  No change in mood or affect. No depression or anxiety. No memory loss.  Neuro: no localizing neurological complaints,  no tingling, no weakness, no double vision, no gait abnormality, no slurred speech, no confusion  Otherwise ROS are negative except for above, 10 systems were reviewed  Past Medical History: Past Medical History  Diagnosis Date  . TMJ (dislocation of temporomandibular joint)   . Thyroid disease   . Lung cancer     awaiting pathology  . Brain cancer     awaiting pathology; assuming this is a lung primary with brain mets  . Dementia   . Hypothyroidism   . GERD (gastroesophageal reflux disease)   . Anxiety    Past Surgical History  Procedure Laterality Date  . Tonsillectomy    . Appendectomy    . Suboccipital craniectomy cervical laminectomy N/A 11/19/2013    Procedure: SUBOCCIPITAL CRANIECTOMY for tumor;  Surgeon: Eustace Moore, MD;  Location: [facility];  Service: Neurosurgery;  Laterality: N/A;  . Lumbar wound debridement N/A 12/05/2013    Procedure: irrigation and debridement posterior cervical wound;  Surgeon: Eustace Moore, MD;  Location: [place];  Service: Neurosurgery;  Laterality: N/A;  . Video assisted thoracoscopy (vats)/wedge resection Left 03/05/2014    Procedure: VIDEO ASSISTED THORACOSCOPY (VATS)/WEDGE RESECTION;  Surgeon: Melrose Nakayama, MD;  Location: [facility];  Service: Thoracic;  Laterality: Left;     Medications: Prior to Admission medications   Medication Sig Start Date End Date Taking? Authorizing Provider  HYDROmorphone (DILAUDID) 2 MG tablet Take 1 tablet (2 mg total) by mouth every 6 (six) hours as needed for moderate pain or severe pain. 03/10/14  Yes Wayne E Gold, PA-C  levothyroxine (SYNTHROID, LEVOTHROID) 125 MCG tablet Take 125 mcg by mouth daily  before breakfast.   Yes Historical Provider, MD  methocarbamol (ROBAXIN) 500 MG tablet Take 500 mg by mouth as needed for muscle spasms.   Yes Historical Provider, MD  omeprazole (PRILOSEC) 10 MG capsule Take 10 mg by mouth daily.   Yes Historical Provider, MD  piroxicam (FELDENE) 20 MG capsule Take 20 mg  by mouth daily.   Yes Historical Provider, MD  promethazine (PHENERGAN) 25 MG suppository Place 1 suppository (25 mg total) rectally every 6 (six) hours as needed for nausea or vomiting. 03/16/14  Yes Doy Hutching, MD    Allergies:   Allergies  Allergen Reactions  . Morphine And Related Nausea Only    Social History:  Ambulatory independently   Lives at  Home with family   reports that she quit smoking about 3 months ago. She has never used smokeless tobacco. She reports that she does not drink alcohol or use illicit drugs.   Family History: family history includes Cancer - Other in her other; Dementia in her mother.    Physical Exam: Patient Vitals for the past 24 hrs:  BP Temp Pulse Resp SpO2  03/17/14 2300 160/92 mmHg - 91 - 99 %  03/17/14 2215 146/99 mmHg - 94 - 100 %  03/17/14 2147 164/94 mmHg - 81 - 99 %  03/17/14 1710 102/56 mmHg 97.9 F (36.6 C) 96 18 98 %    1. General:  in No Acute distress 2. Psychological: Alert and  Oriented 3. Head/ENT:   dry Mucous Membranes                          Head Non traumatic, neck supple                          Normal   Dentition 4. SKIN: normal   Skin turgor,  Skin clean Dry and intact no rash 5. Heart: Regular rate and rhythm no Murmur, Rub or gallop 6. Lungs:   no wheezes or crackles  Somewhat diminished 7. Abdomen: Soft, non-tender, Non distended 8. Lower extremities: no clubbing, cyanosis, or edema 9. Neurologically strength 5 out of 5 in all 4 extremities cranial nerves II through XII intact  10. MSK: Normal range of motion  body mass index is unknown because there is no weight on file.   Labs on Admission:   Recent Labs  03/16/14 1510 03/17/14 1710  NA 137 137  K 3.4* 3.3*  CL 94* 96  CO2 19 19  GLUCOSE 155* 146*  BUN 17 24*  CREATININE 0.56 0.51  CALCIUM 10.4 10.1    Recent Labs  03/16/14 1510 03/17/14 1710  AST 16 18  ALT 14 13  ALKPHOS 103 99  BILITOT 0.8 0.8  PROT 7.5 7.4  ALBUMIN 4.1 4.1     Recent Labs  03/16/14 1510 03/17/14 1710  LIPASE 15 23    Recent Labs  03/16/14 1510 03/17/14 1710  WBC 11.0* 17.0*  NEUTROABS 9.8* 14.5*  HGB 15.2* 15.7*  HCT 43.6 44.0  MCV 93.2 91.5  PLT 399 446*   No results found for this basename: CKTOTAL, CKMB, CKMBINDEX, TROPONINI,  in the last 72 hours No results found for this basename: TSH, T4TOTAL, FREET3, T3FREE, THYROIDAB,  in the last 72 hours No results found for this basename: VITAMINB12, FOLATE, FERRITIN, TIBC, IRON, RETICCTPCT,  in the last 72 hours No results found for this basename: HGBA1C    The CrCl  is unknown because both a height and weight (above a minimum accepted value) are required for this calculation. ABG    Component Value Date/Time   PHART 7.404 03/06/2014 0536   HCO3 25.0* 03/06/2014 0536   TCO2 26.3 03/06/2014 0536   ACIDBASEDEF 0.5 03/03/2014 1352   O2SAT 99.8 03/06/2014 0536     No results found for this basename: DDIMER       UA concentrated with high ketones >80    Cultures:    Component Value Date/Time   SDES WOUND NECK 12/05/2013 1454   SDES WOUND NECK 12/05/2013 1454   SPECREQUEST PATIENT ON FOLLOWING ROCEPHIN VANCOMYCIN INFECTED WOUND DRAINAGE 12/05/2013 1454   SPECREQUEST PATIENT ON FOLLOWING ROCEPHIN VANCOMYCIN INFECTED WOUND DRAINAGE 12/05/2013 1454   CULT  Value: NO ANAEROBES ISOLATED Performed at [facility] 12/05/2013 1454   CULT  Value: MODERATE STAPHYLOCOCCUS AUREUS Note: RIFAMPIN AND GENTAMICIN SHOULD NOT BE USED AS SINGLE DRUGS FOR TREATMENT OF STAPH INFECTIONS. Performed at [facility] 12/05/2013 1454   REPTSTATUS 12/10/2013 FINAL 12/05/2013 1454   REPTSTATUS 12/08/2013 FINAL 12/05/2013 1454       Radiological Exams on Admission: Ct Abdomen Pelvis Wo Contrast  03/17/2014   CLINICAL DATA:  53 year old female with abdominal and pelvic pain, vomiting and diarrhea.  EXAM: CT ABDOMEN AND PELVIS WITHOUT CONTRAST  TECHNIQUE: Multidetector CT imaging of the abdomen and pelvis was  performed following the standard protocol without intravenous contrast.  COMPARISON:  Study earlier today. It was unknown that the patient had an earlier exam this day.  FINDINGS: Mild circumferential wall thickening and adjacent inflammation of the descending colon is noted compatible with a mild colitis.  There is no evidence of bowel obstruction, pneumoperitoneum or abscess.  The liver, spleen, adrenal glands, pancreas and gallbladder are unremarkable.  Mild right hydronephrosis is again identified but decreased since 01/25/2014.  A punctate nonobstructing mid left renal calculus is present.  Please note that parenchymal abnormalities may be missed without intravenous contrast.  There is no evidence of free fluid, enlarged lymph nodes, biliary dilation or abdominal aortic aneurysm.  The bladder is within normal limits.  No acute or suspicious bony abnormalities are identified.  IMPRESSION: Mild colitis of the descending colon - likely inflammatory or infectious. No evidence of bowel obstruction, pneumoperitoneum or abscess.  Mild right hydronephrosis again identified, but decreased from 01/25/2014.  Punctate nonobstructing left renal calculus.   Electronically Signed   By: Hassan Rowan M.D.   On: 03/17/2014 21:23   Dg Chest 2 View  03/17/2014   CLINICAL DATA:  Chest pain. History of lung cancer resection on 03/05/2014.  EXAM: CHEST  2 VIEW  COMPARISON:  03/08/2014 and prior chest radiographs. 01/02/2014 PET-CT.  FINDINGS: The cardiomediastinal silhouette is unremarkable.  Postsurgical changes and mild scarring within the left lung noted.  There is no evidence of focal airspace disease, pulmonary edema, suspicious pulmonary nodule/mass, pleural effusion, or pneumothorax. No acute bony abnormalities are identified.  IMPRESSION: No active cardiopulmonary disease.   Electronically Signed   By: Hassan Rowan M.D.   On: 03/17/2014 21:42   Dg Abd 1 View  03/16/2014   CLINICAL DATA:  Right-sided abdominal pain. Nausea and  vomiting. Urolithiasis.  EXAM: ABDOMEN - 1 VIEW  COMPARISON:  None.  FINDINGS: The bowel gas pattern is normal. No radio-opaque calculi or other significant radiographic abnormality are seen. Mild iliac vascular calcification and pelvic phleboliths noted.  IMPRESSION: No acute findings.   Electronically Signed   By: Jenny Reichmann  Kris Hartmann M.D.   On: 03/16/2014 16:47   US Renal  03/16/2014   CLINICAL DATA:  Abdominal pain  EXAM: RENAL/URINARY TRACT ULTRASOUND COMPLETE  COMPARISON:  CT ABD/PELVIS W CM dated 01/25/2014  FINDINGS: Right Kidney:  Length: 11.3 cm in length. Stable mild right hydronephrosis. On the prior CT, right hydronephrosis, delayed excretion of contrast, and a small distal ureteral calculus or noted. No renal mass. Normal cortical echogenicity.  Left Kidney:  Length: 11.5 cm in length. Echogenicity within normal limits. No mass or hydronephrosis visualized.  Bladder:  Ureteral jets were not identified.  Decompressed.  IMPRESSION: Stable right hydronephrosis.  See above discussion.   Electronically Signed   By: Maryclare Bean M.D.   On: 03/16/2014 17:57    Chart has been reviewed  Assessment/Plan  53 year old female with history of melanoma involving lung with solitary metastasis to the brain here with colitis Present on Admission:  . Colitis - continue Cipro and Flagyl, C. difficile less likely but will test for C. difficile and stool cultures.  . Cerebellar mass - CT scan was obtained without contrast no evidence of hemorrhage. Nausea and vomiting not controlled will consider further brain imaging . Solitary brain metastasis from melanoma - sp craniectomy, appears neurologically intact    Prophylaxis: lovenox , Protonix  CODE STATUS: FULL CODE  Other plan as per orders.  I have spent a total of 55 min on this admission  [name] [name] 03/17/2014, 11:48 PM

## 2014-03-17 NOTE — ED Notes (Signed)
Per pt she has been vomiting and unable to eat. sts hx of cancer. sts received phenergan shot and suppositories since last night without relief. sts some diarrhea.

## 2014-03-17 NOTE — Progress Notes (Addendum)
Courtesy visit  I operated on [name] [name] on 03/05/2014. She had a small left lung mass. We did a wedge resection and it turned out to be metastatic malignant melanoma. She had previously had a brain tumor resected. The path on that lesion was indeterminant, but likely is also melanoma.  She had a lot of nausea postop, but seemed to be improving at the time of discharge.  Unfortunately it got worse again after dc. She was seen in the ED yesterday and referred to urology for a possible kidney stone. She came back to the ED this evening with persistent nausea being the primary complaint, but also has been having small amounts of diarrhea. A Ct of the abdomen showed possible colitis. She only received perioperative antibiotics but I have seen rare cases of c diff from that. C diff is pending and she has been started on appropriate antibiotics. She is also being hydrated.  She is not having headaches, but ahs had some mild dizziness, most likely due to dehydration.  Her wound and CXR look good. I removed her chest tube sutures.  I am in agreement with the plan as outlined by the ED/ medical service  I will be away for the next several days but my partners can assist if needed.  She is supposed to see [name] [name] tomorrow, I will send him a message letting him know she was admitted

## 2014-03-17 NOTE — ED Notes (Signed)
Admitting at bedside 

## 2014-03-17 NOTE — Telephone Encounter (Signed)
[name] [name] has been having abdominal pain, nausea and vomiting for several days and went to the ER on 03/16/14, was treated for urolithiasis, treated and released. She wanted [name] to admit here.       She was at the urologist being evaluated for same.  They did a CT and found no evidence of renal stones.  I told her that she needed to go back to the ER for evaluation since according to her "she has not eaten solids in days and cannot hold down even water". She agreed.

## 2014-03-18 ENCOUNTER — Inpatient Hospital Stay (HOSPITAL_COMMUNITY): Payer: 59

## 2014-03-18 ENCOUNTER — Ambulatory Visit: Payer: 59 | Admitting: Internal Medicine

## 2014-03-18 ENCOUNTER — Other Ambulatory Visit: Payer: 59

## 2014-03-18 LAB — CBC
HEMATOCRIT: 40.2 % (ref 36.0–46.0)
Hemoglobin: 13.9 g/dL (ref 12.0–15.0)
MCH: 32.2 pg (ref 26.0–34.0)
MCHC: 34.6 g/dL (ref 30.0–36.0)
MCV: 93.1 fL (ref 78.0–100.0)
Platelets: 353 10*3/uL (ref 150–400)
RBC: 4.32 MIL/uL (ref 3.87–5.11)
RDW: 12.9 % (ref 11.5–15.5)
WBC: 14.4 10*3/uL — ABNORMAL HIGH (ref 4.0–10.5)

## 2014-03-18 LAB — PHOSPHORUS: PHOSPHORUS: 2.3 mg/dL (ref 2.3–4.6)

## 2014-03-18 LAB — COMPREHENSIVE METABOLIC PANEL
ALBUMIN: 3.3 g/dL — AB (ref 3.5–5.2)
ALT: 11 U/L (ref 0–35)
AST: 20 U/L (ref 0–37)
Alkaline Phosphatase: 80 U/L (ref 39–117)
BUN: 11 mg/dL (ref 6–23)
CO2: 17 mEq/L — ABNORMAL LOW (ref 19–32)
CREATININE: 0.43 mg/dL — AB (ref 0.50–1.10)
Calcium: 8.9 mg/dL (ref 8.4–10.5)
Chloride: 100 mEq/L (ref 96–112)
GFR calc Af Amer: >90 mL/min (ref >90)
GFR calc non Af Amer: >90 mL/min (ref >90)
Glucose, Bld: 98 mg/dL (ref 70–99)
POTASSIUM: 3.7 meq/L (ref 3.7–5.3)
Sodium: 133 mEq/L — ABNORMAL LOW (ref 137–147)
TOTAL PROTEIN: 6 g/dL (ref 6.0–8.3)
Total Bilirubin: 0.8 mg/dL (ref 0.3–1.2)

## 2014-03-18 LAB — TSH: TSH: 0.107 u[IU]/mL — AB (ref 0.350–4.500)

## 2014-03-18 LAB — PREALBUMIN: Prealbumin: 14.5 mg/dL — ABNORMAL LOW (ref 17.0–34.0)

## 2014-03-18 LAB — MAGNESIUM: MAGNESIUM: 1.5 mg/dL (ref 1.5–2.5)

## 2014-03-18 MED ORDER — ACETAMINOPHEN 650 MG RE SUPP: 650.0000 mg | Freq: Four times a day (QID) | RECTAL | Status: DC | PRN

## 2014-03-18 MED ORDER — HYDROMORPHONE HCL 2 MG PO TABS
2.0000 mg | ORAL_TABLET | Freq: Four times a day (QID) | ORAL | Status: DC | PRN
  Administered 2014-03-18 – 2014-03-20 (×8): 2 mg via ORAL
  Filled 2014-03-18 (×8): qty 1

## 2014-03-18 MED ORDER — PROMETHAZINE HCL 25 MG RE SUPP
25.0000 mg | Freq: Four times a day (QID) | RECTAL | Status: DC | PRN
  Administered 2014-03-18 (×3): 25 mg via RECTAL
  Filled 2014-03-18 (×3): qty 1

## 2014-03-18 MED ORDER — SODIUM CHLORIDE 0.9 % IJ SOLN: 3.0000 mL | Freq: Two times a day (BID) | INTRAMUSCULAR | Status: DC

## 2014-03-18 MED ORDER — METHOCARBAMOL 500 MG PO TABS
500.0000 mg | ORAL_TABLET | Freq: Four times a day (QID) | ORAL | Status: DC | PRN
  Filled 2014-03-18: qty 1

## 2014-03-18 MED ORDER — ENOXAPARIN SODIUM 40 MG/0.4ML ~~LOC~~ SOLN
40.0000 mg | Freq: Every day | SUBCUTANEOUS | Status: DC
  Filled 2014-03-18 (×3): qty 0.4

## 2014-03-18 MED ORDER — DEXTROSE-NACL 5-0.9 % IV SOLN
INTRAVENOUS | Status: DC
  Administered 2014-03-18: 21:00:00 via INTRAVENOUS
  Administered 2014-03-18: 100 mL/h via INTRAVENOUS
  Administered 2014-03-19 – 2014-03-20 (×3): via INTRAVENOUS

## 2014-03-18 MED ORDER — DOCUSATE SODIUM 100 MG PO CAPS
100.0000 mg | ORAL_CAPSULE | Freq: Two times a day (BID) | ORAL | Status: DC
  Administered 2014-03-18 – 2014-03-20 (×5): 100 mg via ORAL
  Filled 2014-03-18 (×6): qty 1

## 2014-03-18 MED ORDER — METRONIDAZOLE IN NACL 5-0.79 MG/ML-% IV SOLN
500.0000 mg | Freq: Three times a day (TID) | INTRAVENOUS | Status: DC
  Administered 2014-03-18 – 2014-03-20 (×7): 500 mg via INTRAVENOUS
  Filled 2014-03-18 (×10): qty 100

## 2014-03-18 MED ORDER — SODIUM CHLORIDE 0.9 % IV SOLN
INTRAVENOUS | Status: DC
  Administered 2014-03-18: 01:00:00 via INTRAVENOUS

## 2014-03-18 MED ORDER — POTASSIUM CHLORIDE CRYS ER 20 MEQ PO TBCR
40.0000 meq | EXTENDED_RELEASE_TABLET | Freq: Once | ORAL | Status: DC
  Filled 2014-03-18: qty 2

## 2014-03-18 MED ORDER — CIPROFLOXACIN IN D5W 400 MG/200ML IV SOLN
400.0000 mg | Freq: Two times a day (BID) | INTRAVENOUS | Status: DC
  Administered 2014-03-18 – 2014-03-20 (×5): 400 mg via INTRAVENOUS
  Filled 2014-03-18 (×6): qty 200

## 2014-03-18 MED ORDER — HYDROMORPHONE HCL PF 1 MG/ML IJ SOLN
0.5000 mg | INTRAMUSCULAR | Status: DC | PRN
  Administered 2014-03-18 – 2014-03-20 (×9): 0.5 mg via INTRAVENOUS
  Filled 2014-03-18 (×9): qty 1

## 2014-03-18 MED ORDER — LEVOTHYROXINE SODIUM 125 MCG PO TABS
125.0000 ug | ORAL_TABLET | Freq: Every day | ORAL | Status: DC
  Administered 2014-03-18 – 2014-03-19 (×2): 125 ug via ORAL
  Filled 2014-03-18 (×3): qty 1

## 2014-03-18 MED ORDER — PANTOPRAZOLE SODIUM 40 MG PO TBEC
40.0000 mg | DELAYED_RELEASE_TABLET | Freq: Every day | ORAL | Status: DC
  Administered 2014-03-18 – 2014-03-20 (×3): 40 mg via ORAL
  Filled 2014-03-18 (×3): qty 1

## 2014-03-18 MED ORDER — ONDANSETRON HCL 4 MG PO TABS
4.0000 mg | ORAL_TABLET | Freq: Four times a day (QID) | ORAL | Status: DC | PRN
  Administered 2014-03-19 (×2): 4 mg via ORAL
  Filled 2014-03-18 (×2): qty 1

## 2014-03-18 MED ORDER — POTASSIUM CHLORIDE 10 MEQ/100ML IV SOLN
10.0000 meq | INTRAVENOUS | Status: AC
  Administered 2014-03-18 (×4): 10 meq via INTRAVENOUS
  Filled 2014-03-18 (×5): qty 100

## 2014-03-18 MED ORDER — METOCLOPRAMIDE HCL 5 MG/ML IJ SOLN
10.0000 mg | Freq: Four times a day (QID) | INTRAMUSCULAR | Status: DC | PRN
  Administered 2014-03-18 – 2014-03-19 (×4): 10 mg via INTRAVENOUS
  Filled 2014-03-18 (×6): qty 2

## 2014-03-18 MED ORDER — ONDANSETRON HCL 4 MG/2ML IJ SOLN
4.0000 mg | Freq: Four times a day (QID) | INTRAMUSCULAR | Status: DC | PRN
  Administered 2014-03-18 (×3): 4 mg via INTRAVENOUS
  Filled 2014-03-18 (×3): qty 2

## 2014-03-18 MED ORDER — ACETAMINOPHEN 325 MG PO TABS: 650.0000 mg | ORAL_TABLET | Freq: Four times a day (QID) | ORAL | Status: DC | PRN

## 2014-03-18 NOTE — ED Provider Notes (Signed)
This patient was seen in conjunction with the resident physician, Dr. Jodi Mourning.  The documentation accurately reflects the patient's encounter in the emergency department.  On my exam, patient was in no distress. Patient is soft, non-peritoneal abdomen, was hemodynamically stable, afebrile with no evidence of systemic infection or permanent decompensation. Patient tells a story of persistent abdominal discomfort, nausea. During the patient's emergency department course she had improvement in her condition, remained hemodynamically stable, had largely reassuring labs, ultrasound. Patient had CT scan 2 months ago, and given the improvement in her condition another scan was not indicated. Though the patient does have multiple medical problems, given her improved condition, stable vital signs, capacity to followup given her access to providers, she is discharged in stable condition with return precautions, follow up instructions.  Carmin Muskrat, MD 03/18/14 [phone]

## 2014-03-18 NOTE — Progress Notes (Signed)
Report received from RN.

## 2014-03-18 NOTE — Progress Notes (Signed)
INITIAL NUTRITION ASSESSMENT  DOCUMENTATION CODES Per approved criteria  -Severe malnutrition in the context of acute illness or injury -Underweight   INTERVENTION: Monitor magnesium, potassium, and phosphorus daily for at least 3 days during nutrition advancement, MD to replete as needed, as pt is at risk for refeeding syndrome given severe malnutrition. Diet advancement per MD discretion. Recommend Resource Breeze po BID, each supplement provides 250 kcal and 9 grams of protein, berry flavor, once diet advances. RD to continue to follow nutrition care plan.  NUTRITION DIAGNOSIS: Inadequate oral intake related to GI distress as evidenced by weight loss and dietary recall.   Goal: Further diet advancement. Intake to meet >90% of estimated nutrition needs.  Monitor:  weight trends, lab trends, I/O's, PO intake, supplement tolerance, further diet advancement  Reason for Assessment: Malnutrition Screening Tool  53 y.o. female  Admitting Dx: colitis  ASSESSMENT: PMHx significant for TMJ, thyroid disease, lung CA, brain CA, dementia, hypothyroidism, GERD, and anxiety. Patient is s/p craniectomy complicated by staph infection for solitary brain met and lung resection earlier this month. After discharge, developed n/v/d. Reports fever today 100.2. Minimal PO intake since procedure. CT of the abdomen reveals colitis.   Currently NPO, with sips and chips. She states that even after an ice chip, she feels nauseous. Has had very poor appetite since her recent d/c approximately 1 week ago. For example, during one day, all she was able to tolerate was a few bites of eggs. Was able to tolerate Ensure occassionally, but prefers [employer] (was given this in the hospital.)  Pt with 9% wt loss x <3 months.  Physical exam doesn't reveal any significant fat/muscle wasting.  Pt meets criteria for severe MALNUTRITION in the context of acute illness as evidenced by 9% wt loss x <3 months and  intake of <50% x at least 5 days.  Potassium, magnesium and phosphorus WNL.  Height: Ht Readings from Last 1 Encounters:  03/18/14 _0  (1.702 m)    Weight: Wt Readings from Last 1 Encounters:  03/18/14 114 lb 13.8 oz (52.1 kg)    Ideal Body Weight: 135 lb  % Ideal Body Weight: 84%  Wt Readings from Last 25 Encounters:  03/18/14 114 lb 13.8 oz (52.1 kg)  03/16/14 120 lb (54.432 kg)  03/05/14 146 lb 6.2 oz (66.4 kg)  03/05/14 146 lb 6.2 oz (66.4 kg)  03/03/14 126 lb 8.7 oz (57.4 kg)  02/20/14 133 lb 9.6 oz (60.601 kg)  02/20/14 133 lb 4.8 oz (60.464 kg)  02/10/14 133 lb 11.2 oz (60.646 kg)  01/20/14 130 lb 1.6 oz (59.013 kg)  01/20/14 139 lb (63.05 kg)  01/15/14 133 lb 1.6 oz (60.374 kg)  01/08/14 129 lb 9.6 oz (58.786 kg)  01/03/14 130 lb (58.968 kg)  12/27/13 135 lb 9.6 oz (61.508 kg)  12/23/13 127 lb 11.2 oz (57.924 kg)  12/16/13 120 lb 4.8 oz (54.568 kg)  12/16/13 119 lb (53.978 kg)  12/03/13 117 lb 3.2 oz (53.162 kg)  12/03/13 117 lb 3.2 oz (53.162 kg)  12/01/13 120 lb (54.432 kg)  11/15/13 118 lb 13.3 oz (53.9 kg)  11/15/13 118 lb 13.3 oz (53.9 kg)   Usual Body Weight: 120 - 130 lb  % Usual Body Weight: 91%  BMI:  Body mass index is 17.99 kg/(m^2). Underweight  Estimated Nutritional Needs: Kcal: 1800 - 2000 Protein: 77 - 90 g Fluid: 1.8 - 2 liters  Skin: L chest incision  Diet Order: NPO  EDUCATION NEEDS: -No education  needs identified at this time  No intake or output data in the 24 hours ending 03/18/14 1023  Last BM: 4/20  Labs:   Recent Labs Lab 03/16/14 1510 03/17/14 1710 03/18/14 0610  NA 137 137 133*  K 3.4* 3.3* 3.7  CL 94* 96 100  CO2 19 19 17*  BUN 17 24* 11  CREATININE 0.56 0.51 0.43*  CALCIUM 10.4 10.1 8.9  MG  --   --  1.5  PHOS  --   --  2.3  GLUCOSE 155* 146* 98    CBG (last 3)  No results found for this basename: GLUCAP,  in the last 72 hours  Scheduled Meds: . ciprofloxacin  400 mg Intravenous Q12H  .  docusate sodium  100 mg Oral BID  . enoxaparin (LOVENOX) injection  40 mg Subcutaneous Daily  . levothyroxine  125 mcg Oral QAC breakfast  . metronidazole  500 mg Intravenous Q8H  . pantoprazole  40 mg Oral Daily  . potassium chloride  40 mEq Oral Once  . sodium chloride  3 mL Intravenous Q12H    Continuous Infusions: . dextrose 5 % and 0.9% NaCl 100 mL/hr (03/18/14 0959)    Past Medical History  Diagnosis Date  . TMJ (dislocation of temporomandibular joint)   . Thyroid disease   . Lung cancer     awaiting pathology  . Brain cancer     awaiting pathology; assuming this is a lung primary with brain mets  . Dementia   . Hypothyroidism   . GERD (gastroesophageal reflux disease)   . Anxiety     Past Surgical History  Procedure Laterality Date  . Tonsillectomy    . Appendectomy    . Suboccipital craniectomy cervical laminectomy N/A 11/19/2013    Procedure: SUBOCCIPITAL CRANIECTOMY for tumor;  Surgeon: Eustace Moore, MD;  Location: [facility];  Service: Neurosurgery;  Laterality: N/A;  . Lumbar wound debridement N/A 12/05/2013    Procedure: irrigation and debridement posterior cervical wound;  Surgeon: Eustace Moore, MD;  Location: [facility];  Service: Neurosurgery;  Laterality: N/A;  . Video assisted thoracoscopy (vats)/wedge resection Left 03/05/2014    Procedure: VIDEO ASSISTED THORACOSCOPY (VATS)/WEDGE RESECTION;  Surgeon: Melrose Nakayama, MD;  Location: [facility];  Service: Thoracic;  Laterality: Left;    Inda Coke MS, RD, LDN Inpatient Registered Dietitian Pager: [phone] After-hours pager: [phone]

## 2014-03-18 NOTE — Progress Notes (Signed)
Utilization review completed.  

## 2014-03-18 NOTE — Progress Notes (Signed)
Pt admitted to the unit. Pt is alert and oriented. Pt oriented to room, staff, and call bell. Bed in lowest position. Full assessment to Epic. Call bell with in reach. Told to call for assists. Will continue to monitor.  Cammi Consalvo E Wendy Mikles  

## 2014-03-18 NOTE — Progress Notes (Signed)
Patient seen and evaluated earlier this a.m. please refer to [facility] regarding assessment and plan.  CT of the head without contrast reports no acute intracranial process. This is been discussed with patient. CT of abdomen reporting mild colitis of the descending colon. We'll continue current IV antibiotic regimen and place on D5 normal saline while patient is n.p.o.  Velvet Bathe

## 2014-03-19 ENCOUNTER — Telehealth: Payer: Self-pay | Admitting: *Deleted

## 2014-03-19 DIAGNOSIS — E039 Hypothyroidism, unspecified: Secondary | ICD-10-CM

## 2014-03-19 DIAGNOSIS — E43 Unspecified severe protein-calorie malnutrition: Secondary | ICD-10-CM | POA: Insufficient documentation

## 2014-03-19 LAB — T4, FREE: Free T4: 1.84 ng/dL — ABNORMAL HIGH (ref 0.80–1.80)

## 2014-03-19 MED ORDER — ALUM & MAG HYDROXIDE-SIMETH 200-200-20 MG/5ML PO SUSP: 30.0000 mL | Freq: Four times a day (QID) | ORAL | Status: DC | PRN

## 2014-03-19 MED ORDER — LEVOTHYROXINE SODIUM 100 MCG PO TABS
100.0000 ug | ORAL_TABLET | Freq: Every day | ORAL | Status: DC
  Administered 2014-03-20: 100 ug via ORAL
  Filled 2014-03-19 (×2): qty 1

## 2014-03-19 NOTE — Progress Notes (Signed)
PATIENT DETAILS Name: [name] [name] Age: 53 y.o. Sex: female Date of Birth: February 19, 1961 Admit Date: 03/17/2014 Admitting Physician Toy Baker, MD EUM:[id number], Annie Main, MD  Subjective: Feels better. No diarrhea. Somewhat nauseous, but no further vomiting.  Assessment/Plan: Active Problems: Nausea/vomiting and diarrhea - Likely secondary to colitis - Continue with antibiotics, antiemetics, slowly advance diet  Colitis - Suspect infectious etiology. - Diarrhea seems to have resolved, started on clear liquids today- as nausea vomiting much better. - If diarrhea recurs, we need to send C. difficile PCR - Continue with ciprofloxacin/Flagyl-day 2  History of malignant melanoma with brain and lung metastases - Being followed at the cancer center by Dr. Inda Merlin. - CT head on 4/21 and negative for any new lesions. - Is status post craniotomy and status post wedge resection of left lung mass.  History of hypothyroidism - TSH is suppressed, patient claims that to 2 months back she was on 150 mcg of levothyroxine, this was then decreased to 125 mcg. Will decrease it further to 100 mcg, and have the patient followup with her PCP for a repeat TSH in 3 months.  Protein-calorie malnutrition, severe - Continue with Supplements as recommended by Nutrition  Disposition: Remain inpatient  DVT Prophylaxis: Prophylactic Lovenox   Code Status: Full code  Family Communication None at bedside  Procedures:  None  CONSULTS:  None  Time spent 40 minutes-which includes 50% of the time with face-to-face with patient/ family and coordinating care related to the above assessment and plan.    MEDICATIONS: Scheduled Meds: . ciprofloxacin  400 mg Intravenous Q12H  . docusate sodium  100 mg Oral BID  . enoxaparin (LOVENOX) injection  40 mg Subcutaneous Daily  . levothyroxine  125 mcg Oral QAC breakfast  . metronidazole  500 mg Intravenous Q8H  . pantoprazole  40 mg Oral  Daily  . potassium chloride  40 mEq Oral Once  . sodium chloride  3 mL Intravenous Q12H   Continuous Infusions: . dextrose 5 % and 0.9% NaCl 100 mL/hr at 03/19/14 1008   PRN Meds:.acetaminophen, acetaminophen, HYDROmorphone (DILAUDID) injection, HYDROmorphone, methocarbamol, metoCLOPramide (REGLAN) injection, ondansetron (ZOFRAN) IV, ondansetron, promethazine  Antibiotics: Anti-infectives   Start     Dose/Rate Route Frequency Ordered Stop   03/18/14 1000  ciprofloxacin (CIPRO) IVPB 400 mg     400 mg 200 mL/hr over 60 Minutes Intravenous Every 12 hours 03/18/14 0054     03/18/14 0600  metroNIDAZOLE (FLAGYL) IVPB 500 mg     500 mg 100 mL/hr over 60 Minutes Intravenous Every 8 hours 03/18/14 0054     03/17/14 2145  ciprofloxacin (CIPRO) IVPB 400 mg     400 mg 200 mL/hr over 60 Minutes Intravenous  Once 03/17/14 2131 03/17/14 2344   03/17/14 2145  metroNIDAZOLE (FLAGYL) IVPB 500 mg     500 mg 100 mL/hr over 60 Minutes Intravenous  Once 03/17/14 2131 03/17/14 2219       PHYSICAL EXAM: Vital signs in last 24 hours: Filed Vitals:   03/18/14 1346 03/18/14 2149 03/19/14 0220 03/19/14 0542  BP: 117/79 120/76 113/68 109/72  Pulse: 88 67 69 63  Temp: 97.8 F (36.6 C) 98.1 F (36.7 C) 98.2 F (36.8 C) 98.2 F (36.8 C)  TempSrc: Oral Oral Oral Oral  Resp: 20 18 16 16   Height:      Weight:      SpO2: 98% 99% 98% 98%    Weight change:  [employer]  03/18/14 0049  Weight: 52.1 kg (114 lb 13.8 oz)   Body mass index is 17.99 kg/(m^2).   Gen Exam: Awake and alert with clear speech.   Neck: Supple, No JVD.   Chest: B/L Clear.   CVS: S1 S2 Regular, no murmurs.  Abdomen: soft, BS +, non tender, non distended. Extremities: no edema, lower extremities warm to touch. Neurologic: Non Focal.   Skin: No Rash.   Wounds: N/A.    Intake/Output from previous day:  Intake/Output Summary (Last 24 hours) at 03/19/14 1326 Last data filed at 03/19/14 1140  Gross per 24 hour  Intake    2265 ml  Output      0 ml  Net   2265 ml     LAB RESULTS: CBC  Recent Labs Lab 03/16/14 1510 03/17/14 1710 03/18/14 0610  WBC 11.0* 17.0* 14.4*  HGB 15.2* 15.7* 13.9  HCT 43.6 44.0 40.2  PLT 399 446* 353  MCV 93.2 91.5 93.1  MCH 32.5 32.6 32.2  MCHC 34.9 35.7 34.6  RDW 12.8 12.8 12.9  LYMPHSABS 0.7 0.9  --   MONOABS 0.4 1.6*  --   EOSABS 0.0 0.0  --   BASOSABS 0.0 0.0  --     Chemistries   Recent Labs Lab 03/16/14 1510 03/17/14 1710 03/18/14 0610  NA 137 137 133*  K 3.4* 3.3* 3.7  CL 94* 96 100  CO2 19 19 17*  GLUCOSE 155* 146* 98  BUN 17 24* 11  CREATININE 0.56 0.51 0.43*  CALCIUM 10.4 10.1 8.9  MG  --   --  1.5    CBG: No results found for this basename: GLUCAP,  in the last 168 hours  GFR Estimated Creatinine Clearance: 66.9 ml/min (by C-G formula based on Cr of 0.43).  Coagulation profile No results found for this basename: INR, PROTIME,  in the last 168 hours  Cardiac Enzymes No results found for this basename: CK, CKMB, TROPONINI, MYOGLOBIN,  in the last 168 hours  No components found with this basename: POCBNP,  No results found for this basename: DDIMER,  in the last 72 hours No results found for this basename: HGBA1C,  in the last 72 hours No results found for this basename: CHOL, HDL, LDLCALC, TRIG, CHOLHDL, LDLDIRECT,  in the last 72 hours  Recent Labs  03/18/14 0610  TSH 0.107*   No results found for this basename: VITAMINB12, FOLATE, FERRITIN, TIBC, IRON, RETICCTPCT,  in the last 72 hours  Recent Labs  03/16/14 1510 03/17/14 1710  LIPASE 15 23    Urine Studies No results found for this basename: UACOL, UAPR, USPG, UPH, UTP, UGL, UKET, UBIL, UHGB, UNIT, UROB, ULEU, UEPI, UWBC, URBC, UBAC, CAST, CRYS, UCOM, BILUA,  in the last 72 hours  MICROBIOLOGY: No results found for this or any previous visit (from the past 240 hour(s)).  RADIOLOGY STUDIES/RESULTS: Ct Abdomen Pelvis Wo Contrast  03/17/2014   CLINICAL DATA:   53 year old female with abdominal and pelvic pain, vomiting and diarrhea.  EXAM: CT ABDOMEN AND PELVIS WITHOUT CONTRAST  TECHNIQUE: Multidetector CT imaging of the abdomen and pelvis was performed following the standard protocol without intravenous contrast.  COMPARISON:  Study earlier today. It was unknown that the patient had an earlier exam this day.  FINDINGS: Mild circumferential wall thickening and adjacent inflammation of the descending colon is noted compatible with a mild colitis.  There is no evidence of bowel obstruction, pneumoperitoneum or abscess.  The liver, spleen, adrenal glands, pancreas and gallbladder  are unremarkable.  Mild right hydronephrosis is again identified but decreased since 01/25/2014.  A punctate nonobstructing mid left renal calculus is present.  Please note that parenchymal abnormalities may be missed without intravenous contrast.  There is no evidence of free fluid, enlarged lymph nodes, biliary dilation or abdominal aortic aneurysm.  The bladder is within normal limits.  No acute or suspicious bony abnormalities are identified.  IMPRESSION: Mild colitis of the descending colon - likely inflammatory or infectious. No evidence of bowel obstruction, pneumoperitoneum or abscess.  Mild right hydronephrosis again identified, but decreased from 01/25/2014.  Punctate nonobstructing left renal calculus.   Electronically Signed   By: Hassan Rowan M.D.   On: 03/17/2014 21:23   Dg Chest 2 View  03/17/2014   CLINICAL DATA:  Chest pain. History of lung cancer resection on 03/05/2014.  EXAM: CHEST  2 VIEW  COMPARISON:  03/08/2014 and prior chest radiographs. 01/02/2014 PET-CT.  FINDINGS: The cardiomediastinal silhouette is unremarkable.  Postsurgical changes and mild scarring within the left lung noted.  There is no evidence of focal airspace disease, pulmonary edema, suspicious pulmonary nodule/mass, pleural effusion, or pneumothorax. No acute bony abnormalities are identified.  IMPRESSION: No  active cardiopulmonary disease.   Electronically Signed   By: Hassan Rowan M.D.   On: 03/17/2014 21:42   Dg Chest 2 View  03/08/2014   CLINICAL DATA:  Status post pulmonary a waitress section.  EXAM: CHEST  2 VIEW  COMPARISON:  03/07/2014  FINDINGS: Lung base atelectasis has improved from the previous day's study. No new lung opacities. No pneumothorax.  Heart, mediastinum and hila are unremarkable. Right internal jugular central venous line is stable with its tip in the lower superior vena cava.  IMPRESSION: 1. Improved lung base atelectasis. 2. No acute findings.  No edema or pneumothorax.   Electronically Signed   By: Lajean Manes M.D.   On: 03/08/2014 09:32   Chest 2 View  03/03/2014   CLINICAL DATA:  History of lung cancer.  EXAM: CHEST  2 VIEW  COMPARISON:  Chest CT 11/15/2013.  FINDINGS: Questionable nodule in the left upper lobe. Recent chest CT of 11/15/2013 reveals a nodule superior segment left lower lobe.The heart size and mediastinal contours are within normal limits. The visualized skeletal structures are unremarkable.  IMPRESSION: Questionable nodule left upper lung, recent CT of the chest dated 11/15/2013 reveals a spiculated nodule in this region suspicious for malignancy. No acute cardiopulmonary disease noted.   Electronically Signed   By: Marcello Moores  Register   On: 03/03/2014 14:22   Dg Abd 1 View  03/16/2014   CLINICAL DATA:  Right-sided abdominal pain. Nausea and vomiting. Urolithiasis.  EXAM: ABDOMEN - 1 VIEW  COMPARISON:  None.  FINDINGS: The bowel gas pattern is normal. No radio-opaque calculi or other significant radiographic abnormality are seen. Mild iliac vascular calcification and pelvic phleboliths noted.  IMPRESSION: No acute findings.   Electronically Signed   By: Earle Gell M.D.   On: 03/16/2014 16:47   Ct Head Wo Contrast  03/18/2014   CLINICAL DATA:  Persistent nausea, history of brain surgery (cerebellar mass).  EXAM: CT HEAD WITHOUT CONTRAST  TECHNIQUE: Contiguous axial  images were obtained from the base of the skull through the vertex without intravenous contrast.  COMPARISON:  NM PET IMAGE INITIAL (PI) SKULL BASE TO THIGH dated 01/02/2014; CT HEAD W/O CM dated 12/04/2013  FINDINGS: No intraparenchymal hemorrhage, mass effect, midline shift. No acute large vascular territory infarcts. No hydrocephalus.  Status post  right occipital craniectomy with right cerebellar cystic resection cavity, unchanged. Basal cisterns are patent. Mild calcific atherosclerosis of the carotid siphons.  No skull fracture. Visualized paranasal sinuses and mastoid air cells are well aerated. Left of the osteoarthrosis.  IMPRESSION: No acute intracranial process.  Right occipital craniectomy with stable appearance of the subjacent right cerebellar resection cavity on this noncontrast examination.   Electronically Signed   By: Elon Alas   On: 03/18/2014 00:54   Dg Esophagus  02/25/2014   CLINICAL DATA:  Dysphagia  EXAM: ESOPHOGRAM / BARIUM SWALLOW / BARIUM TABLET STUDY  TECHNIQUE: Combined double contrast and single contrast examination performed using effervescent crystals, thick barium liquid, and thin barium liquid. The patient was observed with fluoroscopy swallowing a 77mm barium sulphate tablet.  FLUOROSCOPY TIME:  1 min 42 seconds  COMPARISON:  None.  FINDINGS: Initially a double-contrast study was performed. The mucosa of the esophagus is unremarkable. A single contrast study shows the swallowing mechanism to be normal. There is prominence of the cricopharyngeus muscle noted. Only mild tertiary contractions are noted in the mid and distal esophagus. No hiatal hernia is seen. However, there is moderate gastroesophageal reflux demonstrated. A barium pill was given at the end of the study which passed into the stomach without delay.  IMPRESSION: 1. Moderate gastroesophageal reflux. Barium pill passes into the stomach without delay. 2. Prominent cricopharyngeus muscle. 3. Mild tertiary  contractions.   Electronically Signed   By: Ivar Drape M.D.   On: 02/25/2014 14:51   US Renal  03/16/2014   CLINICAL DATA:  Abdominal pain  EXAM: RENAL/URINARY TRACT ULTRASOUND COMPLETE  COMPARISON:  CT ABD/PELVIS W CM dated 01/25/2014  FINDINGS: Right Kidney:  Length: 11.3 cm in length. Stable mild right hydronephrosis. On the prior CT, right hydronephrosis, delayed excretion of contrast, and a small distal ureteral calculus or noted. No renal mass. Normal cortical echogenicity.  Left Kidney:  Length: 11.5 cm in length. Echogenicity within normal limits. No mass or hydronephrosis visualized.  Bladder:  Ureteral jets were not identified.  Decompressed.  IMPRESSION: Stable right hydronephrosis.  See above discussion.   Electronically Signed   By: Maryclare Bean M.D.   On: 03/16/2014 17:57   Dg Chest 1v Repeat Same Day  03/07/2014   CLINICAL DATA:  Status post chest tube removal  EXAM: CHEST - 1 VIEW SAME DAY  COMPARISON:  DG CHEST 1V PORT dated 03/07/2014; DG CHEST 1V PORT dated 03/06/2014  FINDINGS: The left-sided chest tube has been removed. There is no evidence of a pneumothorax or significant pleural effusion. There is no shift of the mediastinum. There is atelectasis at the right lung base that is slightly better demonstrated today. The cardiopericardial silhouette is not enlarged. The pulmonary vascularity is not engorged. The right internal jugular venous catheter tip lies in the region of the mid to distal SVC.  IMPRESSION: There is no evidence of a pneumothorax or pleural effusion following left-sided chest tube removal. There is atelectasis at the right lung base that is better demonstrated than on the earlier study.   Electronically Signed   By: David  Martinique   On: 03/07/2014 21:01   Dg Chest Port 1 View  03/07/2014   CLINICAL DATA:  Smoker.  EXAM: PORTABLE CHEST - 1 VIEW  COMPARISON:  DG CHEST 1V PORT dated 03/06/2014  FINDINGS: Right IJ line and left chest tube in stable position. Mediastinum and hilar  structures normal. Heart size normal. No pleural effusion or pneumothorax. Bibasilar atelectasis is  unchanged.  IMPRESSION: 1. Right IJ line and left chest tube in stable position. No pneumothorax. 2. Persistent bibasilar atelectasis.   Electronically Signed   By: Marcello Moores  Register   On: 03/07/2014 07:33   Dg Chest Port 1 View  03/06/2014   CLINICAL DATA:  Chest tube, follow-up  EXAM: PORTABLE CHEST - 1 VIEW  COMPARISON:  Portable chest x-ray of 03/05/2014  FINDINGS: A left chest tube is unchanged in position and no pneumothorax is seen. Bibasilar atelectasis or linear scarring is noted. Mild cardiomegaly is stable. The right IJ central venous line is unchanged in position.  IMPRESSION: Left chest tube remains. No pneumothorax. Bibasilar linear atelectasis and/or scarring.   Electronically Signed   By: Ivar Drape M.D.   On: 03/06/2014 08:27   Dg Chest Portable 1 View  03/05/2014   CLINICAL DATA:  53 year old female status post VATS. Right IJ central line. Initial encounter.  EXAM: PORTABLE CHEST - 1 VIEW  COMPARISON:  Preoperative chest 03/03/2014.  FINDINGS: Portable AP semi upright view at 1046 hrs. Left chest tube in place. No pneumothorax. Right IJ central line in place, tip at the level of the lower SVC.  Moderate to severe gaseous distension of the stomach. No endotracheal tube or enteric tube identified.  Lower lung volumes with diffuse increased interstitial opacity, favor vascular congestion. No pleural effusion. Platelike atelectasis at the right lung base. Stable cardiac size and mediastinal contours.  IMPRESSION: 1. Left chest tube in place. No pneumothorax. Right IJ central line tip at the lower SVC level. 2. Moderate to severe gaseous distension of the stomach, consider NG tube decompression. 3. Atelectasis and pulmonary vascular congestion.   Electronically Signed   By: Lars Pinks M.D.   On: 03/05/2014 12:34    Bland Rudzinski Kristeen Mans, MD  [facility] Pager:[phone]  If 7PM-7AM, please  contact night-coverage [email or web address] Password [email or web address] 03/19/2014, 1:26 PM   LOS: 2 days

## 2014-03-19 NOTE — Telephone Encounter (Signed)
Pt called from her hospital room to speak to desk RN.  She wanted to make sure we knew that she was in the hospital and that is why she missed her follow up appt yesterday.  She asked scheduling when she can r/s with [facility] and they stated that his 1st available is 5/12.  She wanted to know if there was anything sooner.  Asked her to call when she gets d/c'd from the hospital and we can see if anything has opened up in Dr Worthy Flank schedule and what Dr Julien Nordmann recommends.  She verbalized understanding.  SLJ

## 2014-03-19 NOTE — Progress Notes (Signed)
Consent cannot be obtained at this time because pt states no one has gone over her upcoming procedure. Will continue to monitor.

## 2014-03-20 DIAGNOSIS — R112 Nausea with vomiting, unspecified: Secondary | ICD-10-CM

## 2014-03-20 DIAGNOSIS — G9389 Other specified disorders of brain: Secondary | ICD-10-CM

## 2014-03-20 DIAGNOSIS — E860 Dehydration: Secondary | ICD-10-CM

## 2014-03-20 LAB — BASIC METABOLIC PANEL
BUN: 4 mg/dL — AB (ref 6–23)
CHLORIDE: 107 meq/L (ref 96–112)
CO2: 22 meq/L (ref 19–32)
Calcium: 8.5 mg/dL (ref 8.4–10.5)
Creatinine, Ser: 0.56 mg/dL (ref 0.50–1.10)
GFR calc Af Amer: >90 mL/min (ref >90)
GFR calc non Af Amer: >90 mL/min (ref >90)
Glucose, Bld: 80 mg/dL (ref 70–99)
Potassium: 3.2 mEq/L — ABNORMAL LOW (ref 3.7–5.3)
Sodium: 140 mEq/L (ref 137–147)

## 2014-03-20 LAB — CBC
HEMATOCRIT: 36.3 % (ref 36.0–46.0)
HEMOGLOBIN: 12.1 g/dL (ref 12.0–15.0)
MCH: 31.7 pg (ref 26.0–34.0)
MCHC: 33.3 g/dL (ref 30.0–36.0)
MCV: 95 fL (ref 78.0–100.0)
Platelets: 321 10*3/uL (ref 150–400)
RBC: 3.82 MIL/uL — AB (ref 3.87–5.11)
RDW: 12.7 % (ref 11.5–15.5)
WBC: 5.8 10*3/uL (ref 4.0–10.5)

## 2014-03-20 MED ORDER — POTASSIUM CHLORIDE CRYS ER 20 MEQ PO TBCR
40.0000 meq | EXTENDED_RELEASE_TABLET | Freq: Once | ORAL | Status: AC
  Administered 2014-03-20: 40 meq via ORAL
  Filled 2014-03-20: qty 2

## 2014-03-20 MED ORDER — METRONIDAZOLE 500 MG PO TABS: 500.0000 mg | ORAL_TABLET | Freq: Three times a day (TID) | ORAL | Status: AC

## 2014-03-20 MED ORDER — BOOST / RESOURCE BREEZE PO LIQD: 1.0000 | Freq: Two times a day (BID) | ORAL | Status: DC

## 2014-03-20 MED ORDER — CIPROFLOXACIN HCL 500 MG PO TABS: 500.0000 mg | ORAL_TABLET | Freq: Two times a day (BID) | ORAL | Status: AC

## 2014-03-20 MED ORDER — ONDANSETRON HCL 4 MG PO TABS: 4.0000 mg | ORAL_TABLET | Freq: Four times a day (QID) | ORAL | Status: DC | PRN

## 2014-03-20 MED ORDER — LEVOTHYROXINE SODIUM 100 MCG PO TABS: 100.0000 ug | ORAL_TABLET | Freq: Every day | ORAL | Status: DC

## 2014-03-20 MED ORDER — ONDANSETRON HCL 4 MG PO TABS: 4.0000 mg | ORAL_TABLET | Freq: Three times a day (TID) | ORAL | Status: DC | PRN

## 2014-03-20 NOTE — Discharge Summary (Signed)
Physician Discharge Summary  [name] [name]:[id number] DOB: October 18, 1961 DOA: 03/17/2014  PCP: Orpah Melter, MD  Admit date: 03/17/2014 Discharge date: 03/20/2014  Time spent: 60 minutes  Recommendations for Outpatient Follow-up:  1. Follow up with GI concerning colitis and nausea vomiting for further workup 2. Continue with Oncology as previously scheduled. 3. BMET in 3-5 days with PCP 4. Please recheck a TSH in 3 months, dose adjusted down to 100 mcg this admission   Discharge Diagnoses:  Active Problems:   Cerebellar mass   Solitary brain metastasis from melanoma   Colitis   Protein-calorie malnutrition, severe   Discharge Condition: stable  Diet recommendation: low sodium heart healthy  Filed Weights   03/18/14 0049  Weight: 52.1 kg (114 lb 13.8 oz)    History of present illness:  [name] [name] is a 53 y.o. female has a past medical history of TMJ (dislocation of temporomandibular joint); Thyroid disease; Lung cancer; Brain cancer; Dementia; Hypothyroidism; GERD (gastroesophageal reflux disease); and Anxiety.  Patient s/p craniectomy complicated by staph infection for solitary brain met and lung resection on 4/08 was discharged on 4/13 and started to develop nausea and vomitng with few episodes of diarrhea no blood in stool. Reports fever today 100.2  But patient have been vomiting for the past 1 week with very little PO intake and minimal abdominal discomfort. CT of the abdomen done on admission showed colitis.    Hospital Course:  Nausea/vomiting and diarrhea  - Likely secondary to colitis  - Resolved with antibiotics, antiemetics, slowly advance diet - by day of discharge able to tolerate a regular diet.  Colitis  - Suspect infectious etiology.  - Diarrhea seems to have resolved, started on clear liquids and diet slowly advanced, was placed on ciprofloxacin and Flagyl on admission, we will continue this on discharge. Since diarrhea resolved, we did not get  any stool samples to send out to see this PCR. today  History of malignant melanoma with brain and lung metastases  - Being followed at the cancer center by Dr. Inda Merlin.  - CT head on 4/21 and negative for any new lesions.  - Is status post craniotomy and status post wedge resection of left lung mass.   History of hypothyroidism  - TSH is suppressed, patient claims that to 2 months back she was on 150 mcg of levothyroxine, this was then decreased to 125 mcg. Will decrease it further to 100 mcg, and have the patient followup with her PCP for a repeat TSH in 3 months.   Protein-calorie malnutrition, severe  - Continue with Supplements as recommended by Nutrition   Discharge Exam: Filed Vitals:   03/20/14 1325  BP: 98/63  Pulse: 72  Temp: 97.9 F (36.6 C)  Resp: 18    Physical Exam  Nursing note and vitals reviewed. Constitutional: She is oriented to person, place, and time. She appears well-developed and well-nourished. No distress.  HENT:  Head: Normocephalic and atraumatic.  Eyes: Pupils are equal, round, and reactive to light.  Neck: No JVD present.  Cardiovascular: Normal rate, regular rhythm and normal heart sounds.   Respiratory: Effort normal and breath sounds normal. No stridor. No respiratory distress. She has no wheezes. She exhibits no tenderness.  GI: Soft. She exhibits no distension. There is no tenderness. There is no rebound.  Musculoskeletal: Normal range of motion. She exhibits no tenderness.  Neurological: She is alert and oriented to person, place, and time.  Skin: Skin is warm and dry. No rash noted.  She is not diaphoretic. No erythema.  Psychiatric: She has a normal mood and affect. Her speech is normal and behavior is normal. Judgment and thought content normal. Cognition and memory are normal.           Discharge Orders   Future Appointments Provider Department Dept Phone   03/25/2014 12:00 PM Melrose Nakayama, MD [facility] [phone]   04/04/2014 3:00 PM [facility] [place] [phone]   Please arrive 15 minutes prior to your appointment time.   04/07/2014 9:00 AM Lora Paula, MD [facility] Oncology [phone]   Future Orders Complete By Expires   Increase activity slowly  As directed        Medication List         ciprofloxacin 500 MG tablet  Commonly known as:  CIPRO  Take 1 tablet (500 mg total) by mouth 2 (two) times daily.     feeding supplement (RESOURCE BREEZE) Liqd  Take 1 Container by mouth 2 (two) times daily between meals.     HYDROmorphone 2 MG tablet  Commonly known as:  DILAUDID  Take 1 tablet (2 mg total) by mouth every 6 (six) hours as needed for moderate pain or severe pain.     levothyroxine 100 MCG tablet  Commonly known as:  SYNTHROID, LEVOTHROID  Take 1 tablet (100 mcg total) by mouth daily before breakfast.     methocarbamol 500 MG tablet  Commonly known as:  ROBAXIN  Take 500 mg by mouth as needed for muscle spasms.     metroNIDAZOLE 500 MG tablet  Commonly known as:  FLAGYL  Take 1 tablet (500 mg total) by mouth 3 (three) times daily.     omeprazole 10 MG capsule  Commonly known as:  PRILOSEC  Take 10 mg by mouth daily.     ondansetron 4 MG tablet  Commonly known as:  ZOFRAN  Take 1 tablet (4 mg total) by mouth every 6 (six) hours as needed for nausea.     piroxicam 20 MG capsule  Commonly known as:  FELDENE  Take 20 mg by mouth daily.     promethazine 25 MG suppository  Commonly known as:  PHENERGAN  Place 1 suppository (25 mg total) rectally every 6 (six) hours as needed for nausea or vomiting.       Allergies  Allergen Reactions  . Morphine And Related Nausea Only   Follow-up Information   Follow up with Orpah Melter, MD. Schedule an appointment as soon as possible for a visit in 1 week. (As needed)    Specialty:  Family Medicine   Contact information:    [place] [address] [phone]       Follow up with [facility]. Call in 1 week. (As needed)    Contact information:   [place] [id number] [phone]       The results of significant diagnostics from this hospitalization (including imaging, microbiology, ancillary and laboratory) are listed below for reference.    Significant Diagnostic Studies: Ct Abdomen Pelvis Wo Contrast  03/17/2014   CLINICAL DATA:  53 year old female with abdominal and pelvic pain, vomiting and diarrhea.  EXAM: CT ABDOMEN AND PELVIS WITHOUT CONTRAST  TECHNIQUE: Multidetector CT imaging of the abdomen and pelvis was performed following the standard protocol without intravenous contrast.  COMPARISON:  Study earlier today. It was unknown that  the patient had an earlier exam this day.  FINDINGS: Mild circumferential wall thickening and adjacent inflammation of the descending colon is noted compatible with a mild colitis.  There is no evidence of bowel obstruction, pneumoperitoneum or abscess.  The liver, spleen, adrenal glands, pancreas and gallbladder are unremarkable.  Mild right hydronephrosis is again identified but decreased since 01/25/2014.  A punctate nonobstructing mid left renal calculus is present.  Please note that parenchymal abnormalities may be missed without intravenous contrast.  There is no evidence of free fluid, enlarged lymph nodes, biliary dilation or abdominal aortic aneurysm.  The bladder is within normal limits.  No acute or suspicious bony abnormalities are identified.  IMPRESSION: Mild colitis of the descending colon - likely inflammatory or infectious. No evidence of bowel obstruction, pneumoperitoneum or abscess.  Mild right hydronephrosis again identified, but decreased from 01/25/2014.  Punctate nonobstructing left renal calculus.   Electronically Signed   By: Hassan Rowan M.D.   On: 03/17/2014 21:23   Dg Chest 2 View  03/17/2014    CLINICAL DATA:  Chest pain. History of lung cancer resection on 03/05/2014.  EXAM: CHEST  2 VIEW  COMPARISON:  03/08/2014 and prior chest radiographs. 01/02/2014 PET-CT.  FINDINGS: The cardiomediastinal silhouette is unremarkable.  Postsurgical changes and mild scarring within the left lung noted.  There is no evidence of focal airspace disease, pulmonary edema, suspicious pulmonary nodule/mass, pleural effusion, or pneumothorax. No acute bony abnormalities are identified.  IMPRESSION: No active cardiopulmonary disease.   Electronically Signed   By: Hassan Rowan M.D.   On: 03/17/2014 21:42   Labs: Basic Metabolic Panel:  Recent Labs Lab 03/16/14 1510 03/17/14 1710 03/18/14 0610 03/20/14 0700  NA 137 137 133* 140  K 3.4* 3.3* 3.7 3.2*  CL 94* 96 100 107  CO2 19 19 17* 22  GLUCOSE 155* 146* 98 80  BUN 17 24* 11 4*  CREATININE 0.56 0.51 0.43* 0.56  CALCIUM 10.4 10.1 8.9 8.5  MG  --   --  1.5  --   PHOS  --   --  2.3  --    Liver Function Tests:  Recent Labs Lab 03/16/14 1510 03/17/14 1710 03/18/14 0610  AST 16 18 20   ALT 14 13 11   ALKPHOS 103 99 80  BILITOT 0.8 0.8 0.8  PROT 7.5 7.4 6.0  ALBUMIN 4.1 4.1 3.3*    Recent Labs Lab 03/16/14 1510 03/17/14 1710  LIPASE 15 23   CBC:  Recent Labs Lab 03/16/14 1510 03/17/14 1710 03/18/14 0610 03/20/14 0700  WBC 11.0* 17.0* 14.4* 5.8  NEUTROABS 9.8* 14.5*  --   --   HGB 15.2* 15.7* 13.9 12.1  HCT 43.6 44.0 40.2 36.3  MCV 93.2 91.5 93.1 95.0  PLT 399 446* 353 321       Signed:  [place], [place] [facility] 03/20/2014, 2:33 PM  Attending Seen and examined, agree with the above assessment and plan. Significantly better, able to tolerate regular diet for lunch. No further nausea vomiting or diarrhea. Abdomen continues to be soft. Suspect stable to be discharged home today.  Nena Alexander MD

## 2014-03-20 NOTE — Progress Notes (Signed)
NURSING PROGRESS NOTE  [name] [name] [id number] Discharge Data: 03/20/2014 2:50 PM Attending Provider: Jonetta Osgood, MD CBU:LAGTXM, Annie Main, MD     [name] to be D/C'd Home with husband per MD order.  Discussed with the patient the After Visit Summary and all questions fully answered. Reviewed Colitis and nausea and vomiting handout with all questions fully answered. All IV's discontinued with no bleeding noted. All belongings returned to patient for patient to take home.   Last Vital Signs:  Blood pressure 98/63, pulse 72, temperature 97.9 F (36.6 C), temperature source Oral, resp. rate 18, height 5\' 7"  (1.702 m), weight 52.1 kg (114 lb 13.8 oz), SpO2 100.00%.  Discharge Medication List   Medication List         ciprofloxacin 500 MG tablet  Commonly known as:  CIPRO  Take 1 tablet (500 mg total) by mouth 2 (two) times daily.     feeding supplement (RESOURCE BREEZE) Liqd  Take 1 Container by mouth 2 (two) times daily between meals.     HYDROmorphone 2 MG tablet  Commonly known as:  DILAUDID  Take 1 tablet (2 mg total) by mouth every 6 (six) hours as needed for moderate pain or severe pain.     levothyroxine 100 MCG tablet  Commonly known as:  SYNTHROID, LEVOTHROID  Take 1 tablet (100 mcg total) by mouth daily before breakfast.     methocarbamol 500 MG tablet  Commonly known as:  ROBAXIN  Take 500 mg by mouth as needed for muscle spasms.     metroNIDAZOLE 500 MG tablet  Commonly known as:  FLAGYL  Take 1 tablet (500 mg total) by mouth 3 (three) times daily.     omeprazole 10 MG capsule  Commonly known as:  PRILOSEC  Take 10 mg by mouth daily.     ondansetron 4 MG tablet  Commonly known as:  ZOFRAN  Take 1 tablet (4 mg total) by mouth every 6 (six) hours as needed for nausea.     piroxicam 20 MG capsule  Commonly known as:  FELDENE  Take 20 mg by mouth daily.     promethazine 25 MG suppository  Commonly known as:  PHENERGAN  Place 1 suppository  (25 mg total) rectally every 6 (six) hours as needed for nausea or vomiting.         Wallie Renshaw, RN

## 2014-03-20 NOTE — Discharge Instructions (Signed)
Colitis °Colitis is inflammation of the colon. Colitis can be a short-term or long-standing (chronic) illness. Crohn's disease and ulcerative colitis are 2 types of colitis which are chronic. They usually require lifelong treatment. °CAUSES  °There are many different causes of colitis, including: °· Viruses. °· Germs (bacteria). °· Medicine reactions. °SYMPTOMS  °· Diarrhea. °· Intestinal bleeding. °· Pain. °· Fever. °· Throwing up (vomiting). °· Tiredness (fatigue). °· Weight loss. °· Bowel blockage. °DIAGNOSIS  °The diagnosis of colitis is based on examination and stool or blood tests. X-rays, CT scan, and colonoscopy may also be needed. °TREATMENT  °Treatment may include: °· Fluids given through the vein (intravenously). °· Bowel rest (nothing to eat or drink for a period of time). °· Medicine for pain and diarrhea. °· Medicines (antibiotics) that kill germs. °· Cortisone medicines. °· Surgery. °HOME CARE INSTRUCTIONS  °· Get plenty of rest. °· Drink enough water and fluids to keep your urine clear or pale yellow. °· Eat a well-balanced diet. °· Call your caregiver for follow-up as recommended. °SEEK IMMEDIATE MEDICAL CARE IF:  °· You develop chills. °· You have an oral temperature above 102° F (38.9° C), not controlled by medicine. °· You have extreme weakness, fainting, or dehydration. °· You have repeated vomiting. °· You develop severe belly (abdominal) pain or are passing bloody or tarry stools. °MAKE SURE YOU:  °· Understand these instructions. °· Will watch your condition. °· Will get help right away if you are not doing well or get worse. °Document Released: 12/22/2004 Document Revised: 02/06/2012 Document Reviewed: 03/19/2010 °ExitCare® Patient Information ©2014 [employer]. ° °

## 2014-03-20 NOTE — Care Management Note (Signed)
    Page 1 of 1   03/20/2014     3:48:07 PM CARE MANAGEMENT NOTE 03/20/2014  Patient:  [name] [name], [name] [name]   Account Number:  [id number]  Date Initiated:  03/20/2014  Documentation initiated by:  Tomi Bamberger  Subjective/Objective Assessment:   dx colitis  admit- lives with spouse.     Action/Plan:   Anticipated DC Date:  03/20/2014   Anticipated DC Plan:  HOME/SELF CARE      DC Planning Services  CM consult      Choice offered to / List presented to:             Status of service:  Completed, signed off Medicare Important Message given?   (If response is "NO", the following Medicare IM given date fields will be blank) Date Medicare IM given:   Date Additional Medicare IM given:    Discharge Disposition:  HOME/SELF CARE  Per UR Regulation:  Reviewed for med. necessity/level of care/duration of stay  If discussed at [place] of Stay Meetings, dates discussed:    Comments:

## 2014-03-21 ENCOUNTER — Telehealth: Payer: Self-pay | Admitting: *Deleted

## 2014-03-21 ENCOUNTER — Telehealth: Payer: Self-pay | Admitting: Internal Medicine

## 2014-03-21 NOTE — Telephone Encounter (Signed)
Pt called stating she is out of the hospital.  Per Dr Vista Mink, okay to schedule f/u appt on 04/04/14 at 9:15am.  Pt is aware of the appt.  SLJ

## 2014-03-21 NOTE — Telephone Encounter (Signed)
added appt per Desk nurse..pt has already been contacted

## 2014-03-24 ENCOUNTER — Encounter: Payer: Self-pay | Admitting: Infectious Diseases

## 2014-03-24 ENCOUNTER — Other Ambulatory Visit: Payer: Self-pay | Admitting: Thoracic Surgery (Cardiothoracic Vascular Surgery)

## 2014-03-24 DIAGNOSIS — R911 Solitary pulmonary nodule: Secondary | ICD-10-CM

## 2014-03-25 ENCOUNTER — Encounter: Payer: Self-pay | Admitting: Thoracic Surgery (Cardiothoracic Vascular Surgery)

## 2014-03-25 ENCOUNTER — Ambulatory Visit
Admission: RE | Admit: 2014-03-25 | Discharge: 2014-03-25 | Disposition: A | Discharge status: Home / Self Care | Payer: 59 | Source: Ambulatory Visit | Attending: Thoracic Surgery (Cardiothoracic Vascular Surgery) | Admitting: Thoracic Surgery (Cardiothoracic Vascular Surgery)

## 2014-03-25 ENCOUNTER — Ambulatory Visit (INDEPENDENT_AMBULATORY_CARE_PROVIDER_SITE_OTHER): Payer: Self-pay | Admitting: Thoracic Surgery (Cardiothoracic Vascular Surgery)

## 2014-03-25 VITALS — BP 90/62 | HR 88 | Resp 20 | Ht 67.0 in | Wt 114.0 lb

## 2014-03-25 DIAGNOSIS — Z8589 Personal history of malignant neoplasm of other organs and systems: Secondary | ICD-10-CM | POA: Insufficient documentation

## 2014-03-25 DIAGNOSIS — R911 Solitary pulmonary nodule: Secondary | ICD-10-CM

## 2014-03-25 DIAGNOSIS — G8918 Other acute postprocedural pain: Secondary | ICD-10-CM

## 2014-03-25 DIAGNOSIS — Z09 Encounter for follow-up examination after completed treatment for conditions other than malignant neoplasm: Secondary | ICD-10-CM

## 2014-03-25 DIAGNOSIS — C439 Malignant melanoma of skin, unspecified: Secondary | ICD-10-CM

## 2014-03-25 DIAGNOSIS — C7800 Secondary malignant neoplasm of unspecified lung: Secondary | ICD-10-CM | POA: Insufficient documentation

## 2014-03-25 MED ORDER — HYDROMORPHONE HCL 2 MG PO TABS: 2.0000 mg | ORAL_TABLET | Freq: Four times a day (QID) | ORAL | Status: DC | PRN

## 2014-03-25 NOTE — Progress Notes (Signed)
HPI:  [name] [name] is a 53 year old woman who had a wedge resection for a metastatic melanoma of her left lung on 03/05/2014. Her initial course was complicated by nausea. She was readmitted on 03/17/2014 for nausea and vomiting abdominal pain. A CT of the abdomen showed colitis. She improved with IV hydration and antibiotics and was discharged home on ciprofloxacin and Flagyl. She will complete those antibiotics on Thursday.  Nausea and a poor appetite continue to be her primary complaints. She does have some incisional pain. She is taking Dilaudid by mouth about every 6 hours for pain. For a while her husband was waking her up at night to give her her pain pills. He's not doing that any longer. She's not having any difficulty breathing.  She had previously had a brain tumor removed. That procedure was complicated by a staph infection. The tumor was never completely identified there was some suspicion that it might be metastatic melanoma as well.  Past Medical History  Diagnosis Date  . TMJ (dislocation of temporomandibular joint)   . Thyroid disease   . Lung cancer     awaiting pathology  . Brain cancer     awaiting pathology; assuming this is a lung primary with brain mets  . Dementia   . Hypothyroidism   . GERD (gastroesophageal reflux disease)   . Anxiety       Current Outpatient Prescriptions  Medication Sig Dispense Refill  . ciprofloxacin (CIPRO) 500 MG tablet Take 1 tablet (500 mg total) by mouth 2 (two) times daily.  14 tablet  0  . feeding supplement, RESOURCE BREEZE, (RESOURCE BREEZE) LIQD Take 1 Container by mouth 2 (two) times daily between meals.  [place] HYDROmorphone (DILAUDID) 2 MG tablet Take 1 tablet (2 mg total) by mouth every 6 (six) hours as needed for moderate pain or severe pain.  50 tablet  0  . levothyroxine (SYNTHROID, LEVOTHROID) 100 MCG tablet Take 1 tablet (100 mcg total) by mouth daily before breakfast.  30 tablet  0  . methocarbamol (ROBAXIN)  500 MG tablet Take 500 mg by mouth as needed for muscle spasms.      . metroNIDAZOLE (FLAGYL) 500 MG tablet Take 1 tablet (500 mg total) by mouth 3 (three) times daily.  21 tablet  0  . omeprazole (PRILOSEC) 10 MG capsule Take 10 mg by mouth daily.      . ondansetron (ZOFRAN) 4 MG tablet Take 1 tablet (4 mg total) by mouth every 6 (six) hours as needed for nausea.  20 tablet  0  . piroxicam (FELDENE) 20 MG capsule Take 20 mg by mouth daily.      . promethazine (PHENERGAN) 25 MG suppository Place 1 suppository (25 mg total) rectally every 6 (six) hours as needed for nausea or vomiting.  20 each  0   No current facility-administered medications for this visit.    Physical Exam BP 90/62  Pulse 88  Resp 20  Ht 5\' 7"  (1.702 m)  Wt 114 lb (51.71 kg)  BMI 17.85 kg/m2  SpO2 98% Well-appearing 53 year old female in no acute distress Alert and oriented Incisions healing well Lungs clear with equal breath sounds bilaterally  Diagnostic Tests: Chest x-ray shows good aeration lungs bilaterally with some minimal linear left lower lobe atelectasis  Impression: 53 year old woman who is now about 3 weeks post wedge resection for metastatic melanoma. The surgical standpoint she is doing well. Her colitis seems to be improved. She will complete her course  of antibiotics on Thursday. Hopefully if she is off the antibiotics her appetite will improve.  She still having significant pain. I gave her a prescription for Dilaudid 2 mg tablets one every 6 hours as needed, 30 tablets, no refills.  She is scheduled to see Dr. Julien Nordmann on May 8.  I will be happy to see her back any time if I can be of any further assistance with her care.

## 2014-04-04 ENCOUNTER — Encounter: Payer: Self-pay | Admitting: Internal Medicine

## 2014-04-04 ENCOUNTER — Telehealth: Payer: Self-pay | Admitting: Internal Medicine

## 2014-04-04 ENCOUNTER — Ambulatory Visit (HOSPITAL_BASED_OUTPATIENT_CLINIC_OR_DEPARTMENT_OTHER): Payer: 59 | Admitting: Internal Medicine

## 2014-04-04 ENCOUNTER — Ambulatory Visit
Admission: RE | Admit: 2014-04-04 | Discharge: 2014-04-04 | Disposition: A | Discharge status: Home / Self Care | Payer: 59 | Source: Ambulatory Visit | Attending: Radiation Oncology | Admitting: Radiation Oncology

## 2014-04-04 VITALS — BP 98/68 | HR 69 | Temp 98.1°F | Resp 18 | Ht 67.0 in | Wt 124.3 lb

## 2014-04-04 DIAGNOSIS — C7800 Secondary malignant neoplasm of unspecified lung: Secondary | ICD-10-CM

## 2014-04-04 DIAGNOSIS — C439 Malignant melanoma of skin, unspecified: Secondary | ICD-10-CM

## 2014-04-04 DIAGNOSIS — C7949 Secondary malignant neoplasm of other parts of nervous system: Principal | ICD-10-CM

## 2014-04-04 DIAGNOSIS — C7931 Secondary malignant neoplasm of brain: Secondary | ICD-10-CM

## 2014-04-04 MED ORDER — GADOBENATE DIMEGLUMINE 529 MG/ML IV SOLN
11.0000 mL | Freq: Once | INTRAVENOUS | Status: AC | PRN
  Administered 2014-04-04: 11 mL via INTRAVENOUS

## 2014-04-04 NOTE — Telephone Encounter (Signed)
gv adn printed appt scehd adn avs for pt for Aug...gv pt barium

## 2014-04-04 NOTE — Progress Notes (Signed)
[place] Telephone:([phone]) [phone]   Fax:(336) [place], [place] [address]  DIAGNOSIS: Metastatic malignant melanoma with brain metastasis diagnosed in December of 2014.  MOLECULAR BIOMARKERS: (Foundation one) Showed POSITIVE BRAFV600E, PTEN loss, BARD1 splice site [id number], CDKN2A loss Exons 1-2, LYN amplification, MYC amplification, KDM6A [id number]*17.  PRIOR THERAPY: 1) status post Right suboccipital craniectomy for removal of right cerebellar mass utilizing frameless stereotactic stealth guidance.  The final pathology (Accession: [id number] showed malignant neoplasm on 11/19/2013 under the care of Dr. DSherley Bounds  2) stereotactic radio surgery to the resection cavity on 12/26/2013 under the care of Dr. MTammi Klippel 3) status post left VATS with wedge resection of the left lower lobe lung lesion with lymph node sampling under the care of Dr. HRoxan Hockeyon 03/05/2014.  CURRENT THERAPY: Observation.  INTERVAL HISTORY: [name] OKONSKI89y.o. female returns to the clinic today for accompanied by her husband for followup visit. The patient recently underwent left VATS with left lower lobe wedge resection and node sampling under the care of Dr. HRoxan Hockeyon 03/05/2014 and the final pathology (Accession: [id number] was consistent with metastatic malignant melanoma. The tumor is well circumscribed and composed of sheets of cells with nuclear pleomorphism, nucleoli and frequent mitotic figures. Immunohistochemistry is performed and the tumor is positive with S100, Vimentin, NK1C3 and shows patchy positive with MITF, Melan-A, HMB45, CD56, CD10, and synaptophysin. The tumor is negative with lymphoid marker CD45 and epithelial markers including cytokeratin 5/6, cytokeratin 7, cytokeratin 8, cytokeratin 20, cytokeratin 903, cytokeratin AE1/AE3 and epithelial membrane antigen. The tumor is also negative  with estrogen receptor, progesterone receptor, gross cystic disease fluid protein, Napsin-A, thyroid transcription factor-1, MOC-31 and alpha fetoprotein and p63. The morphologic and immunophenotypic findings are consistent with malignant melanoma. This is likely a metastatic lesion. Her surgery was complicated with infectious colitis and [facility] She was treated with ciprofloxacin and Flagyl. She is here today for evaluation and discussion of her treatment options after her diagnosis with metastatic malignant melanoma. The patient is feeling much better today. She denied having any significant fever or chills. She has no nausea or vomiting. The patient denied having any significant weight loss or night sweats. She has no chest pain, shortness breath, cough or hemoptysis.  MEDICAL HISTORY: Past Medical History  Diagnosis Date  . TMJ (dislocation of temporomandibular joint)   . Thyroid disease   . Lung cancer     awaiting pathology  . Brain cancer     awaiting pathology; assuming this is a lung primary with brain mets  . Dementia   . Hypothyroidism   . GERD (gastroesophageal reflux disease)   . Anxiety     ALLERGIES:  is allergic to morphine and related.  MEDICATIONS:  Current Outpatient Prescriptions  Medication Sig Dispense Refill  . HYDROmorphone (DILAUDID) 2 MG tablet Take 1 tablet (2 mg total) by mouth every 6 (six) hours as needed for moderate pain or severe pain.  50 tablet  0  . levothyroxine (SYNTHROID, LEVOTHROID) 100 MCG tablet Take 1 tablet (100 mcg total) by mouth daily before breakfast.  30 tablet  0  . omeprazole (PRILOSEC) 10 MG capsule Take 10 mg by mouth daily.      . ondansetron (ZOFRAN) 4 MG tablet Take 1 tablet (4 mg total) by mouth every 6 (six) hours as needed  for nausea.  20 tablet  0  . promethazine (PHENERGAN) 25 MG suppository Place 1 suppository (25 mg total) rectally every 6 (six)  hours as needed for nausea or vomiting.  20 each  0  . methocarbamol (ROBAXIN) 500 MG tablet Take 500 mg by mouth as needed for muscle spasms.      . piroxicam (FELDENE) 20 MG capsule Take 20 mg by mouth daily.       No current facility-administered medications for this visit.    SURGICAL HISTORY:  Past Surgical History  Procedure Laterality Date  . Tonsillectomy    . Appendectomy    . Suboccipital craniectomy cervical laminectomy N/A 11/19/2013    Procedure: SUBOCCIPITAL CRANIECTOMY for tumor;  Surgeon: Eustace Moore, MD;  Location: [facility];  Service: Neurosurgery;  Laterality: N/A;  . Lumbar wound debridement N/A 12/05/2013    Procedure: irrigation and debridement posterior cervical wound;  Surgeon: Eustace Moore, MD;  Location: [facility];  Service: Neurosurgery;  Laterality: N/A;  . Video assisted thoracoscopy (vats)/wedge resection Left 03/05/2014    Procedure: VIDEO ASSISTED THORACOSCOPY (VATS)/WEDGE RESECTION;  Surgeon: Melrose Nakayama, MD;  Location: [facility];  Service: Thoracic;  Laterality: Left;    REVIEW OF SYSTEMS:  Constitutional: negative Eyes: negative Ears, nose, mouth, throat, and face: negative Respiratory: negative Cardiovascular: negative Gastrointestinal: negative Genitourinary:negative Integument/breast: negative Hematologic/lymphatic: negative Musculoskeletal:negative Neurological: positive for headaches Behavioral/Psych: negative Endocrine: negative Allergic/Immunologic: negative   PHYSICAL EXAMINATION: General appearance: alert, cooperative and no distress Head: Normocephalic, without obvious abnormality, atraumatic Neck: no adenopathy, no JVD, supple, symmetrical, trachea midline and thyroid not enlarged, symmetric, no tenderness/mass/nodules Lymph nodes: Cervical, supraclavicular, and axillary nodes normal. Resp: clear to auscultation bilaterally Back: symmetric, no curvature. ROM normal. No CVA tenderness. Cardio: regular rate and rhythm, S1,  S2 normal, no murmur, click, rub or gallop GI: soft, non-tender; bowel sounds normal; no masses,  no organomegaly Extremities: extremities normal, atraumatic, no cyanosis or edema Neurologic: Alert and oriented X 3, normal strength and tone. Normal symmetric reflexes. Normal coordination and gait  ECOG PERFORMANCE STATUS: 1 - Symptomatic but completely ambulatory  Blood pressure 98/68, pulse 69, temperature 98.1 F (36.7 C), temperature source Oral, resp. rate 18, height _0  (1.702 m), weight 124 lb 4.8 oz (56.382 kg), SpO2 100.00%.  LABORATORY DATA: Lab Results  Component Value Date   WBC 5.8 03/20/2014   HGB 12.1 03/20/2014   HCT 36.3 03/20/2014   MCV 95.0 03/20/2014   PLT 321 03/20/2014      Chemistry      Component Value Date/Time   NA 140 03/20/2014 0700   NA 142 02/10/2014 1458   K 3.2* 03/20/2014 0700   K 4.6 02/10/2014 1458   CL 107 03/20/2014 0700   CO2 22 03/20/2014 0700   CO2 24 02/10/2014 1458   BUN 4* 03/20/2014 0700   BUN 18.6 02/10/2014 1458   CREATININE 0.56 03/20/2014 0700   CREATININE 0.8 02/10/2014 1458      Component Value Date/Time   CALCIUM 8.5 03/20/2014 0700   CALCIUM 9.5 02/10/2014 1458   ALKPHOS 80 03/18/2014 0610   ALKPHOS 105 02/10/2014 1458   AST 20 03/18/2014 0610   AST 15 02/10/2014 1458   ALT 11 03/18/2014 0610   ALT 17 02/10/2014 1458   BILITOT 0.8 03/18/2014 0610   BILITOT 0.48 02/10/2014 1458       RADIOGRAPHIC STUDIES: Ct Abdomen Pelvis Wo Contrast  03/17/2014   CLINICAL DATA:  53 year old female with  abdominal and pelvic pain, vomiting and diarrhea.  EXAM: CT ABDOMEN AND PELVIS WITHOUT CONTRAST  TECHNIQUE: Multidetector CT imaging of the abdomen and pelvis was performed following the standard protocol without intravenous contrast.  COMPARISON:  Study earlier today. It was unknown that the patient had an earlier exam this day.  FINDINGS: Mild circumferential wall thickening and adjacent inflammation of the descending colon is noted compatible with a  mild colitis.  There is no evidence of bowel obstruction, pneumoperitoneum or abscess.  The liver, spleen, adrenal glands, pancreas and gallbladder are unremarkable.  Mild right hydronephrosis is again identified but decreased since 01/25/2014.  A punctate nonobstructing mid left renal calculus is present.  Please note that parenchymal abnormalities may be missed without intravenous contrast.  There is no evidence of free fluid, enlarged lymph nodes, biliary dilation or abdominal aortic aneurysm.  The bladder is within normal limits.  No acute or suspicious bony abnormalities are identified.  IMPRESSION: Mild colitis of the descending colon - likely inflammatory or infectious. No evidence of bowel obstruction, pneumoperitoneum or abscess.  Mild right hydronephrosis again identified, but decreased from 01/25/2014.  Punctate nonobstructing left renal calculus.   Electronically Signed   By: Hassan Rowan M.D.   On: 03/17/2014 21:23   Dg Chest 2 View  03/25/2014   CLINICAL DATA:  History of lung carcinoma with surgery in April of 2015, followup  EXAM: CHEST  2 VIEW  COMPARISON:  Chest x-ray of 03/17/2014  FINDINGS: There is minimal linear atelectasis or scarring within the left lower lobe -lingula remaining. No infiltrate or effusion is seen. No pneumothorax is noted. Mediastinal contours are unchanged in the heart is within normal limits in size.  IMPRESSION: Linear atelectasis or scarring medially at the left lung base. No pneumothorax. No effusion.   Electronically Signed   By: Ivar Drape M.D.   On: 03/25/2014 11:40   Dg Chest 2 View  03/17/2014   CLINICAL DATA:  Chest pain. History of lung cancer resection on 03/05/2014.  EXAM: CHEST  2 VIEW  COMPARISON:  03/08/2014 and prior chest radiographs. 01/02/2014 PET-CT.  FINDINGS: The cardiomediastinal silhouette is unremarkable.  Postsurgical changes and mild scarring within the left lung noted.  There is no evidence of focal airspace disease, pulmonary edema,  suspicious pulmonary nodule/mass, pleural effusion, or pneumothorax. No acute bony abnormalities are identified.  IMPRESSION: No active cardiopulmonary disease.   Electronically Signed   By: Hassan Rowan M.D.   On: 03/17/2014 21:42   Dg Chest 2 View  03/08/2014   CLINICAL DATA:  Status post pulmonary a waitress section.  EXAM: CHEST  2 VIEW  COMPARISON:  03/07/2014  FINDINGS: Lung base atelectasis has improved from the previous day's study. No new lung opacities. No pneumothorax.  Heart, mediastinum and hila are unremarkable. Right internal jugular central venous line is stable with its tip in the lower superior vena cava.  IMPRESSION: 1. Improved lung base atelectasis. 2. No acute findings.  No edema or pneumothorax.   Electronically Signed   By: Lajean Manes M.D.   On: 03/08/2014 09:32   Dg Abd 1 View  03/16/2014   CLINICAL DATA:  Right-sided abdominal pain. Nausea and vomiting. Urolithiasis.  EXAM: ABDOMEN - 1 VIEW  COMPARISON:  None.  FINDINGS: The bowel gas pattern is normal. No radio-opaque calculi or other significant radiographic abnormality are seen. Mild iliac vascular calcification and pelvic phleboliths noted.  IMPRESSION: No acute findings.   Electronically Signed   By: Sharrie Rothman.D.  On: 03/16/2014 16:47   Ct Head Wo Contrast  03/18/2014   CLINICAL DATA:  Persistent nausea, history of brain surgery (cerebellar mass).  EXAM: CT HEAD WITHOUT CONTRAST  TECHNIQUE: Contiguous axial images were obtained from the base of the skull through the vertex without intravenous contrast.  COMPARISON:  NM PET IMAGE INITIAL (PI) SKULL BASE TO THIGH dated 01/02/2014; CT HEAD W/O CM dated 12/04/2013  FINDINGS: No intraparenchymal hemorrhage, mass effect, midline shift. No acute large vascular territory infarcts. No hydrocephalus.  Status post right occipital craniectomy with right cerebellar cystic resection cavity, unchanged. Basal cisterns are patent. Mild calcific atherosclerosis of the carotid siphons.  No skull  fracture. Visualized paranasal sinuses and mastoid air cells are well aerated. Left of the osteoarthrosis.  IMPRESSION: No acute intracranial process.  Right occipital craniectomy with stable appearance of the subjacent right cerebellar resection cavity on this noncontrast examination.   Electronically Signed   By: Elon Alas   On: 03/18/2014 00:54   Mr Brain W Wo Contrast  04/04/2014   CLINICAL DATA:  Status post resection of a right cerebellar metastasis followed by [facility]. Suspected melanoma.  EXAM: MRI HEAD WITHOUT AND WITH CONTRAST  TECHNIQUE: Multiplanar, multiecho pulse sequences of the brain and surrounding structures were obtained without and with intravenous contrast.  CONTRAST:  37m MULTIHANCE GADOBENATE DIMEGLUMINE 529 MG/ML IV SOLN  COMPARISON:  CT head 03/18/2014.  MR head 11/14/2013.  FINDINGS: The patient underwent suboccipital craniectomy with removal of a right cerebellar metastasis 11/14/2013. Surgical encephalomalacia at the site of removal. Prominent dural enhancement is an expected postoperative finding.  There is minor inferior and medial enhancement of the cavity with a prominent draining vein. This is the first Brain MR since surgery. The findings may simply be related to postoperative scarring, but continued surveillance is warranted to ensure interval stability. Certainly no infiltrative component of enhancement as was present on the preoperative scan. No new lesions are evident. No osseous findings.  Normal cerebral volume. No significant white matter disease. No evidence for post treatment effect. Unremarkable paranasal sinuses. Mild mastoid fluid.  IMPRESSION: Minor inferior medial enhancement of the surgical cavity status post resection of a right cerebellar metastasis. Prominent dorsal draining vein. No convincing evidence for recurrent tumor, but continued surveillance is warranted.   Electronically Signed   By: JRolla FlattenM.D.   On: 04/04/2014 16:50   [place] Renal  03/16/2014   CLINICAL DATA:  Abdominal pain  EXAM: RENAL/URINARY TRACT ULTRASOUND COMPLETE  COMPARISON:  CT ABD/PELVIS W CM dated 01/25/2014  FINDINGS: Right Kidney:  Length: 11.3 cm in length. Stable mild right hydronephrosis. On the prior CT, right hydronephrosis, delayed excretion of contrast, and a small distal ureteral calculus or noted. No renal mass. Normal cortical echogenicity.  Left Kidney:  Length: 11.5 cm in length. Echogenicity within normal limits. No mass or hydronephrosis visualized.  Bladder:  Ureteral jets were not identified.  Decompressed.  IMPRESSION: Stable right hydronephrosis.  See above discussion.   Electronically Signed   By: AMaryclare BeanM.D.   On: 03/16/2014 17:57   Dg Chest 1v Repeat Same Day  03/07/2014   CLINICAL DATA:  Status post chest tube removal  EXAM: CHEST - 1 VIEW SAME DAY  COMPARISON:  DG CHEST 1V PORT dated 03/07/2014; DG CHEST 1V PORT dated 03/06/2014  FINDINGS: The left-sided chest tube has been removed. There is no evidence of a pneumothorax or significant pleural effusion. There is no shift of the mediastinum. There is atelectasis at  the right lung base that is slightly better demonstrated today. The cardiopericardial silhouette is not enlarged. The pulmonary vascularity is not engorged. The right internal jugular venous catheter tip lies in the region of the mid to distal SVC.  IMPRESSION: There is no evidence of a pneumothorax or pleural effusion following left-sided chest tube removal. There is atelectasis at the right lung base that is better demonstrated than on the earlier study.   Electronically Signed   By: David  Martinique   On: 03/07/2014 21:01   ASSESSMENT AND PLAN: This is a very pleasant 53 years old white female with malignant melanoma presented with solitary brain lesions as well as solitary lung lesion in the superior segment of the left lower lobe status post resection of both lesions. The final pathology from the lung lesion was consistent with  metastatic malignant melanoma and the molecular biomarker testing was positive for  BRAF V600E mutation. I have a lengthy discussion with the patient and her husband today about her current disease status and treatment options. I offered the patient the treatment with zelboraf or other inhibitors versus observation and close monitoring since she has no evidence of residual disease. After discussion of the 2 options indications. The patient would like to continue on observation. I will see her back for followup visit in 3 months with repeat CT scan of the chest, abdomen and pelvis. She is scheduled to have MRI of the brain performed later today ordered by Dr. Tammi Klippel for evaluation of her brain lesion. She was advised to call me immediately if she has any concerning symptoms in the interval. The patient voices understanding of current disease status and treatment options and is in agreement with the current care plan.  All questions were answered. The patient knows to call the clinic with any problems, questions or concerns. We can certainly see the patient much sooner if necessary. I spent 15 minutes in face-to-face counseling with the patient and her husband out of the total visit time 25 minutes.  Disclaimer: This note was dictated with voice recognition software. Similar sounding words can inadvertently be transcribed and may not be corrected upon review.

## 2014-04-07 ENCOUNTER — Telehealth: Payer: Self-pay | Admitting: Radiation Oncology

## 2014-04-07 ENCOUNTER — Ambulatory Visit: Payer: 59 | Admitting: Radiation Oncology

## 2014-04-07 NOTE — Telephone Encounter (Signed)
Patient didn't show for follow up appointment to review MRI this morning. Phoned patient at home. Patient apologized reporting she over slept. Patient denies complaints. States, "I feel really good." Explained this Probation officer will question Dr. Tammi Klippel about MRI result and call her back later today. Again, patient apologized and expressed appreciation for the call.

## 2014-04-07 NOTE — Telephone Encounter (Signed)
Sam, Let her know the MRI looked good.  No evidence of recurrence or new areas.  Repeat surveillance MRI in 3 months, and I can see her after that. MM

## 2014-04-07 NOTE — Telephone Encounter (Signed)
Lora Paula, MD at 04/07/2014 3:54 PM     Status: Signed        Sam,  Let her know the MRI looked good. No evidence of recurrence or new areas. Repeat surveillance MRI in 3 months, and I can see her after that.  MM        Heywood Footman, RN at 04/07/2014 9:25 AM     Status: Signed        Patient didn't show for follow up appointment to review MRI this morning. Phoned patient at home. Patient apologized reporting she over slept. Patient denies complaints. States, "I feel really good." Explained this Probation officer will question Dr. Tammi Klippel about MRI result and call her back later today. Again, patient apologized and expressed appreciation for the call.    Phoned patient with good results. She expressed relief. Explained Marcine Matar will contact her with MRI scan instructions for three months out and follow up appointment. Advised patient to call with future needs and she verbalized understanding.

## 2014-04-11 ENCOUNTER — Other Ambulatory Visit: Payer: 59

## 2014-04-14 ENCOUNTER — Ambulatory Visit: Payer: 59 | Admitting: Radiation Oncology

## 2014-04-17 ENCOUNTER — Other Ambulatory Visit: Payer: Self-pay

## 2014-04-17 DIAGNOSIS — G8918 Other acute postprocedural pain: Secondary | ICD-10-CM

## 2014-04-17 MED ORDER — TRAMADOL HCL 50 MG PO TABS: 50.0000 mg | ORAL_TABLET | Freq: Two times a day (BID) | ORAL | Status: DC | PRN

## 2014-04-17 NOTE — Telephone Encounter (Signed)
RX for Tramadol 50 mg 1 po every 12 hours prn Faxed to pt's pharm.

## 2014-04-29 ENCOUNTER — Inpatient Hospital Stay (HOSPITAL_COMMUNITY)
Admission: EM | Admit: 2014-04-29 | Discharge: 2014-05-03 | DRG: 392 | Disposition: A | Discharge status: Home / Self Care | Payer: 59 | Attending: Internal Medicine | Admitting: Internal Medicine

## 2014-04-29 ENCOUNTER — Encounter (HOSPITAL_COMMUNITY): Payer: Self-pay | Admitting: Emergency Medicine

## 2014-04-29 ENCOUNTER — Emergency Department (HOSPITAL_COMMUNITY): Payer: 59

## 2014-04-29 DIAGNOSIS — R1110 Vomiting, unspecified: Secondary | ICD-10-CM

## 2014-04-29 DIAGNOSIS — B370 Candidal stomatitis: Secondary | ICD-10-CM

## 2014-04-29 DIAGNOSIS — Z79899 Other long term (current) drug therapy: Secondary | ICD-10-CM

## 2014-04-29 DIAGNOSIS — G9389 Other specified disorders of brain: Secondary | ICD-10-CM

## 2014-04-29 DIAGNOSIS — E876 Hypokalemia: Secondary | ICD-10-CM | POA: Diagnosis present

## 2014-04-29 DIAGNOSIS — R197 Diarrhea, unspecified: Secondary | ICD-10-CM

## 2014-04-29 DIAGNOSIS — Z8589 Personal history of malignant neoplasm of other organs and systems: Secondary | ICD-10-CM | POA: Diagnosis present

## 2014-04-29 DIAGNOSIS — Z9889 Other specified postprocedural states: Secondary | ICD-10-CM

## 2014-04-29 DIAGNOSIS — E279 Disorder of adrenal gland, unspecified: Secondary | ICD-10-CM

## 2014-04-29 DIAGNOSIS — E860 Dehydration: Secondary | ICD-10-CM | POA: Diagnosis present

## 2014-04-29 DIAGNOSIS — Z87891 Personal history of nicotine dependence: Secondary | ICD-10-CM

## 2014-04-29 DIAGNOSIS — E039 Hypothyroidism, unspecified: Secondary | ICD-10-CM | POA: Diagnosis present

## 2014-04-29 DIAGNOSIS — R911 Solitary pulmonary nodule: Secondary | ICD-10-CM

## 2014-04-29 DIAGNOSIS — Z720 Tobacco use: Secondary | ICD-10-CM

## 2014-04-29 DIAGNOSIS — R739 Hyperglycemia, unspecified: Secondary | ICD-10-CM | POA: Diagnosis present

## 2014-04-29 DIAGNOSIS — R1115 Cyclical vomiting syndrome unrelated to migraine: Principal | ICD-10-CM | POA: Diagnosis present

## 2014-04-29 DIAGNOSIS — C7949 Secondary malignant neoplasm of other parts of nervous system: Secondary | ICD-10-CM

## 2014-04-29 DIAGNOSIS — K529 Noninfective gastroenteritis and colitis, unspecified: Secondary | ICD-10-CM

## 2014-04-29 DIAGNOSIS — E43 Unspecified severe protein-calorie malnutrition: Secondary | ICD-10-CM

## 2014-04-29 DIAGNOSIS — Z681 Body mass index (BMI) 19 or less, adult: Secondary | ICD-10-CM

## 2014-04-29 DIAGNOSIS — E278 Other specified disorders of adrenal gland: Secondary | ICD-10-CM

## 2014-04-29 DIAGNOSIS — T8149XA Infection following a procedure, other surgical site, initial encounter: Secondary | ICD-10-CM

## 2014-04-29 DIAGNOSIS — K219 Gastro-esophageal reflux disease without esophagitis: Secondary | ICD-10-CM | POA: Diagnosis present

## 2014-04-29 DIAGNOSIS — R112 Nausea with vomiting, unspecified: Secondary | ICD-10-CM | POA: Diagnosis present

## 2014-04-29 DIAGNOSIS — C7970 Secondary malignant neoplasm of unspecified adrenal gland: Secondary | ICD-10-CM | POA: Diagnosis present

## 2014-04-29 DIAGNOSIS — Z87442 Personal history of urinary calculi: Secondary | ICD-10-CM

## 2014-04-29 DIAGNOSIS — C439 Malignant melanoma of skin, unspecified: Secondary | ICD-10-CM | POA: Diagnosis present

## 2014-04-29 DIAGNOSIS — K5289 Other specified noninfective gastroenteritis and colitis: Secondary | ICD-10-CM | POA: Diagnosis present

## 2014-04-29 DIAGNOSIS — F411 Generalized anxiety disorder: Secondary | ICD-10-CM | POA: Diagnosis present

## 2014-04-29 DIAGNOSIS — C7931 Secondary malignant neoplasm of brain: Secondary | ICD-10-CM

## 2014-04-29 DIAGNOSIS — C7800 Secondary malignant neoplasm of unspecified lung: Secondary | ICD-10-CM | POA: Diagnosis present

## 2014-04-29 LAB — CBC WITH DIFFERENTIAL/PLATELET
Basophils Absolute: 0 10*3/uL (ref 0.0–0.1)
Basophils Relative: 0 % (ref 0–1)
Eosinophils Absolute: 0 10*3/uL (ref 0.0–0.7)
Eosinophils Relative: 0 % (ref 0–5)
HCT: 46.9 % — ABNORMAL HIGH (ref 36.0–46.0)
HEMOGLOBIN: 16.1 g/dL — AB (ref 12.0–15.0)
LYMPHS ABS: 0.9 10*3/uL (ref 0.7–4.0)
LYMPHS PCT: 8 % — AB (ref 12–46)
MCH: 31.3 pg (ref 26.0–34.0)
MCHC: 34.3 g/dL (ref 30.0–36.0)
MCV: 91.1 fL (ref 78.0–100.0)
Monocytes Absolute: 0.5 10*3/uL (ref 0.1–1.0)
Monocytes Relative: 4 % (ref 3–12)
NEUTROS ABS: 9.2 10*3/uL — AB (ref 1.7–7.7)
NEUTROS PCT: 88 % — AB (ref 43–77)
Platelets: 367 10*3/uL (ref 150–400)
RBC: 5.15 MIL/uL — AB (ref 3.87–5.11)
RDW: 12.8 % (ref 11.5–15.5)
WBC: 10.5 10*3/uL (ref 4.0–10.5)

## 2014-04-29 LAB — COMPREHENSIVE METABOLIC PANEL
ALK PHOS: 120 U/L — AB (ref 39–117)
ALT: 19 U/L (ref 0–35)
AST: 27 U/L (ref 0–37)
Albumin: 4.6 g/dL (ref 3.5–5.2)
BILIRUBIN TOTAL: 0.8 mg/dL (ref 0.3–1.2)
BUN: 11 mg/dL (ref 6–23)
CHLORIDE: 99 meq/L (ref 96–112)
CO2: 18 meq/L — AB (ref 19–32)
Calcium: 10.9 mg/dL — ABNORMAL HIGH (ref 8.4–10.5)
Creatinine, Ser: 0.52 mg/dL (ref 0.50–1.10)
GFR calc non Af Amer: >90 mL/min (ref >90)
GLUCOSE: 210 mg/dL — AB (ref 70–99)
POTASSIUM: 4.4 meq/L (ref 3.7–5.3)
SODIUM: 141 meq/L (ref 137–147)
TOTAL PROTEIN: 8.2 g/dL (ref 6.0–8.3)

## 2014-04-29 LAB — URINALYSIS, ROUTINE W REFLEX MICROSCOPIC
Glucose, UA: 500 mg/dL — AB
Ketones, ur: 40 mg/dL — AB
Leukocytes, UA: NEGATIVE
Nitrite: NEGATIVE
Protein, ur: 100 mg/dL — AB
SPECIFIC GRAVITY, URINE: 1.026 (ref 1.005–1.030)
UROBILINOGEN UA: 0.2 mg/dL (ref 0.0–1.0)
pH: 5 (ref 5.0–8.0)

## 2014-04-29 LAB — URINE MICROSCOPIC-ADD ON

## 2014-04-29 LAB — LIPASE, BLOOD: Lipase: 7 U/L — ABNORMAL LOW (ref 11–59)

## 2014-04-29 MED ORDER — PROMETHAZINE HCL 25 MG/ML IJ SOLN
12.5000 mg | Freq: Once | INTRAMUSCULAR | Status: AC
  Administered 2014-04-29: 12.5 mg via INTRAVENOUS
  Filled 2014-04-29: qty 1

## 2014-04-29 MED ORDER — ONDANSETRON HCL 4 MG/2ML IJ SOLN
4.0000 mg | Freq: Once | INTRAMUSCULAR | Status: AC
  Administered 2014-04-29: 4 mg via INTRAVENOUS
  Filled 2014-04-29: qty 2

## 2014-04-29 MED ORDER — SODIUM CHLORIDE 0.9 % IV BOLUS (SEPSIS)
1000.0000 mL | Freq: Once | INTRAVENOUS | Status: AC
  Administered 2014-04-29: 1000 mL via INTRAVENOUS

## 2014-04-29 MED ORDER — IOHEXOL 300 MG/ML  SOLN
100.0000 mL | Freq: Once | INTRAMUSCULAR | Status: AC | PRN
  Administered 2014-04-29: 100 mL via INTRAVENOUS

## 2014-04-29 NOTE — ED Notes (Signed)
Patient remains emesis free at this time. NAD noted.

## 2014-04-29 NOTE — ED Notes (Signed)
Patient reports a history of colitis. Patient reports severe nausea that started this am. Patient reports vomiting with anything she intakes.

## 2014-04-29 NOTE — ED Provider Notes (Signed)
CSN: [id number]     Arrival date & time 04/29/14  1928 History   First MD Initiated Contact with Patient 04/29/14 2139     Chief Complaint  Patient presents with  . Nausea    hx of colitis     (Consider location/radiation/quality/duration/timing/severity/associated sxs/prior Treatment) HPI Comments: Patient presents with vomiting and diarrhea. She has history of lung cancer status post nodule removal as well as brain cancer. She was admitted about 2-3 weeks ago for colitis. She at that point had intractable vomiting and diarrhea. She was treated with antibiotics and was feeling better although she never got her appetite back. Yesterday she started having nausea again and today she hasn't been able to keep anything down. She started having watery diarrhea about 3 days ago. She denies any blood in her stool or emesis. She denies a known fevers. She has some crampy soreness across the abdomen. She denies any urinary symptoms although she does have a history of kidney stones.   Past Medical History  Diagnosis Date  . TMJ (dislocation of temporomandibular joint)   . Thyroid disease   . Lung cancer     awaiting pathology  . Brain cancer     awaiting pathology; assuming this is a lung primary with brain mets  . Dementia   . Hypothyroidism   . GERD (gastroesophageal reflux disease)   . Anxiety    Past Surgical History  Procedure Laterality Date  . Tonsillectomy    . Appendectomy    . Suboccipital craniectomy cervical laminectomy N/A 11/19/2013    Procedure: SUBOCCIPITAL CRANIECTOMY for tumor;  Surgeon: Eustace Moore, MD;  Location: [place] [facility];  Service: Neurosurgery;  Laterality: N/A;  . Lumbar wound debridement N/A 12/05/2013    Procedure: irrigation and debridement posterior cervical wound;  Surgeon: Eustace Moore, MD;  Location: [place] [facility];  Service: Neurosurgery;  Laterality: N/A;  . Video assisted thoracoscopy (vats)/wedge resection Left 03/05/2014    Procedure: VIDEO ASSISTED  THORACOSCOPY (VATS)/WEDGE RESECTION;  Surgeon: Melrose Nakayama, MD;  Location: [place];  Service: Thoracic;  Laterality: Left;   Family History  Problem Relation Age of Onset  . Dementia Mother   . Cancer - Other Other    History  Substance Use Topics  . Smoking status: Former Smoker    Quit date: 11/19/2013  . Smokeless tobacco: Never Used  . Alcohol Use: No   OB History   Grav Para Term Preterm Abortions TAB SAB Ect Mult Living                 Review of Systems  Constitutional: Positive for fatigue. Negative for fever, chills and diaphoresis.  HENT: Negative for congestion, rhinorrhea and sneezing.   Eyes: Negative.   Respiratory: Negative for cough, chest tightness and shortness of breath.   Cardiovascular: Negative for chest pain and leg swelling.  Gastrointestinal: Positive for nausea, vomiting, abdominal pain and diarrhea. Negative for blood in stool.  Genitourinary: Negative for frequency, hematuria, flank pain and difficulty urinating.  Musculoskeletal: Negative for arthralgias and back pain.  Skin: Negative for rash.  Neurological: Positive for light-headedness. Negative for dizziness, speech difficulty, weakness, numbness and headaches.      Allergies  Morphine and related  Home Medications   Prior to Admission medications   Medication Sig Start Date End Date Taking? Authorizing Provider  levothyroxine (SYNTHROID, LEVOTHROID) 125 MCG tablet Take 125 mcg by mouth daily before breakfast.   Yes Historical Provider, MD  methocarbamol (ROBAXIN) 500 MG  tablet Take 500 mg by mouth as needed for muscle spasms.   Yes Historical Provider, MD  omeprazole (PRILOSEC) 10 MG capsule Take 10 mg by mouth daily.   Yes Historical Provider, MD  ondansetron (ZOFRAN) 4 MG tablet Take 1 tablet (4 mg total) by mouth every 6 (six) hours as needed for nausea. 03/20/14  Yes Marianne L York, PA-C  piroxicam (FELDENE) 20 MG capsule Take 20 mg by mouth daily.   Yes Historical Provider, MD   promethazine (PHENERGAN) 25 MG suppository Place 1 suppository (25 mg total) rectally every 6 (six) hours as needed for nausea or vomiting. 03/16/14  Yes Doy Hutching, MD  traMADol (ULTRAM) 50 MG tablet Take 1 tablet (50 mg total) by mouth every 12 (twelve) hours as needed for moderate pain. 04/17/14  Yes Melrose Nakayama, MD   BP 156/109  Pulse 67  Temp(Src) 97.7 F (36.5 C) (Oral)  Resp 18  Ht 5\' 7"  (1.702 m)  Wt 120 lb (54.432 kg)  BMI 18.79 kg/m2  SpO2 98% Physical Exam  Constitutional: She is oriented to person, place, and time. She appears well-developed and well-nourished.  HENT:  Head: Normocephalic and atraumatic.  Dry mucous membranes  Eyes: Pupils are equal, round, and reactive to light.  Neck: Normal range of motion. Neck supple.  Cardiovascular: Normal rate, regular rhythm and normal heart sounds.   Pulmonary/Chest: Effort normal and breath sounds normal. No respiratory distress. She has no wheezes. She has no rales. She exhibits no tenderness.  Abdominal: Soft. Bowel sounds are normal. There is no tenderness. There is no rebound and no guarding.  Musculoskeletal: Normal range of motion. She exhibits no edema.  Lymphadenopathy:    She has no cervical adenopathy.  Neurological: She is alert and oriented to person, place, and time.  Skin: Skin is warm and dry. No rash noted.  Psychiatric: She has a normal mood and affect.    ED Course  Procedures (including critical care time) Labs Review Results for orders placed during the hospital encounter of 04/29/14  CBC WITH DIFFERENTIAL      Result Value Ref Range   WBC 10.5  4.0 - 10.5 K/uL   RBC 5.15 (*) 3.87 - 5.11 MIL/uL   Hemoglobin 16.1 (*) 12.0 - 15.0 g/dL   HCT 46.9 (*) 36.0 - 46.0 %   MCV 91.1  78.0 - 100.0 fL   MCH 31.3  26.0 - 34.0 pg   MCHC 34.3  30.0 - 36.0 g/dL   RDW 12.8  11.5 - 15.5 %   Platelets 367  150 - 400 K/uL   Neutrophils Relative % 88 (*) 43 - 77 %   Neutro Abs 9.2 (*) 1.7 - 7.7 K/uL    Lymphocytes Relative 8 (*) 12 - 46 %   Lymphs Abs 0.9  0.7 - 4.0 K/uL   Monocytes Relative 4  3 - 12 %   Monocytes Absolute 0.5  0.1 - 1.0 K/uL   Eosinophils Relative 0  0 - 5 %   Eosinophils Absolute 0.0  0.0 - 0.7 K/uL   Basophils Relative 0  0 - 1 %   Basophils Absolute 0.0  0.0 - 0.1 K/uL  COMPREHENSIVE METABOLIC PANEL      Result Value Ref Range   Sodium 141  137 - 147 mEq/L   Potassium 4.4  3.7 - 5.3 mEq/L   Chloride 99  96 - 112 mEq/L   CO2 18 (*) 19 - 32 mEq/L   Glucose, Bld 210 (*)  70 - 99 mg/dL   BUN 11  6 - 23 mg/dL   Creatinine, Ser 0.52  0.50 - 1.10 mg/dL   Calcium 10.9 (*) 8.4 - 10.5 mg/dL   Total Protein 8.2  6.0 - 8.3 g/dL   Albumin 4.6  3.5 - 5.2 g/dL   AST 27  0 - 37 U/L   ALT 19  0 - 35 U/L   Alkaline Phosphatase 120 (*) 39 - 117 U/L   Total Bilirubin 0.8  0.3 - 1.2 mg/dL   GFR calc non Af Amer >90  >90 mL/min   GFR calc Af Amer >90  >90 mL/min  LIPASE, BLOOD      Result Value Ref Range   Lipase 7 (*) 11 - 59 U/L  URINALYSIS, ROUTINE W REFLEX MICROSCOPIC      Result Value Ref Range   Color, Urine YELLOW  YELLOW   APPearance CLOUDY (*) CLEAR   Specific Gravity, Urine 1.026  1.005 - 1.030   pH 5.0  5.0 - 8.0   Glucose, UA 500 (*) NEGATIVE mg/dL   Hgb urine dipstick SMALL (*) NEGATIVE   Bilirubin Urine SMALL (*) NEGATIVE   Ketones, ur 40 (*) NEGATIVE mg/dL   Protein, ur 100 (*) NEGATIVE mg/dL   Urobilinogen, UA 0.2  0.0 - 1.0 mg/dL   Nitrite NEGATIVE  NEGATIVE   Leukocytes, UA NEGATIVE  NEGATIVE  URINE MICROSCOPIC-ADD ON      Result Value Ref Range   Squamous Epithelial / LPF FEW (*) RARE   WBC, UA 0-2  <3 WBC/hpf   RBC / HPF 0-2  <3 RBC/hpf   Bacteria, UA FEW (*) RARE   Urine-Other MUCOUS PRESENT     Mr Jeri Cos Wo Contrast  04/04/2014   CLINICAL DATA:  Status post resection of a right cerebellar metastasis followed by SRS. Suspected melanoma.  EXAM: MRI HEAD WITHOUT AND WITH CONTRAST  TECHNIQUE: Multiplanar, multiecho pulse sequences of the brain  and surrounding structures were obtained without and with intravenous contrast.  CONTRAST:  69mL MULTIHANCE GADOBENATE DIMEGLUMINE 529 MG/ML IV SOLN  COMPARISON:  CT head 03/18/2014.  MR head 11/14/2013.  FINDINGS: The patient underwent suboccipital craniectomy with removal of a right cerebellar metastasis 11/14/2013. Surgical encephalomalacia at the site of removal. Prominent dural enhancement is an expected postoperative finding.  There is minor inferior and medial enhancement of the cavity with a prominent draining vein. This is the first Brain MR since surgery. The findings may simply be related to postoperative scarring, but continued surveillance is warranted to ensure interval stability. Certainly no infiltrative component of enhancement as was present on the preoperative scan. No new lesions are evident. No osseous findings.  Normal cerebral volume. No significant white matter disease. No evidence for post treatment effect. Unremarkable paranasal sinuses. Mild mastoid fluid.  IMPRESSION: Minor inferior medial enhancement of the surgical cavity status post resection of a right cerebellar metastasis. Prominent dorsal draining vein. No convincing evidence for recurrent tumor, but continued surveillance is warranted.   Electronically Signed   By: Rolla Flatten M.D.   On: 04/04/2014 16:50     Imaging Review Ct Abdomen Pelvis W Contrast  04/30/2014   CLINICAL DATA:  Vomiting.  History of colitis.  History of melanoma.  EXAM: CT ABDOMEN AND PELVIS WITH CONTRAST  TECHNIQUE: Multidetector CT imaging of the abdomen and pelvis was performed using the standard protocol following bolus administration of intravenous contrast.  CONTRAST:  11mL OMNIPAQUE IOHEXOL 300 MG/ML  SOLN  COMPARISON:  03/17/2014  FINDINGS: BODY WALL: Unremarkable.  LOWER CHEST: 5 mm subpleural nodule in the right lower lobe is stable from previous. There is chronic scarring in the medial segment right middle lobe and in the posterior left lower  lobe.  ABDOMEN/PELVIS:  Liver: Subcapsular low-density in the lower right lobe liver is stable from enhanced CT 01/25/2014 and likely a benign incidental cyst.  Biliary: No evidence of biliary obstruction or stone.  Pancreas: Unremarkable.  Spleen: Unremarkable.  Adrenals: New right adrenal nodule, 15 mm in diameter.  Kidneys and ureters: No hydronephrosis or nephrolithiasis. Ill-defined hypoenhancement in the upper and interpolar left kidney are nonspecific due to small size.  Bladder: Unremarkable.  Reproductive: Unremarkable.  Bowel: No obstruction. No evidence of enterocolitis. No pericecal inflammation.  Retroperitoneum: No mass or adenopathy.  Peritoneum: No free fluid or gas.  Vascular: No acute abnormality.  OSSEOUS: No acute abnormalities.  IMPRESSION: 1. No acute intra-abdominal findings. 2. New 15 mm right adrenal nodule, a probable melanoma metastasis.   Electronically Signed   By: Jorje Guild M.D.   On: 04/30/2014 00:15     EKG Interpretation None      MDM   Final diagnoses:  Gastroenteritis  Adrenal nodule    Patient is hydrated with normal saline. She was given Zofran and Phenergan for nausea and vomiting. Even after this she has ongoing vomiting. There is no evidence of colitis on CT. There is a new adrenal nodule will need oncology followup. I spoke [facility] Hal [facility] will [facility] the patient.    Malvin Johns, MD 04/30/14 [id number]

## 2014-04-30 ENCOUNTER — Encounter (HOSPITAL_COMMUNITY): Payer: Self-pay | Admitting: Internal Medicine

## 2014-04-30 DIAGNOSIS — R7309 Other abnormal glucose: Secondary | ICD-10-CM

## 2014-04-30 DIAGNOSIS — R112 Nausea with vomiting, unspecified: Secondary | ICD-10-CM | POA: Diagnosis present

## 2014-04-30 DIAGNOSIS — R1110 Vomiting, unspecified: Secondary | ICD-10-CM | POA: Insufficient documentation

## 2014-04-30 DIAGNOSIS — E279 Disorder of adrenal gland, unspecified: Secondary | ICD-10-CM

## 2014-04-30 DIAGNOSIS — E039 Hypothyroidism, unspecified: Secondary | ICD-10-CM

## 2014-04-30 DIAGNOSIS — C439 Malignant melanoma of skin, unspecified: Secondary | ICD-10-CM

## 2014-04-30 DIAGNOSIS — K5289 Other specified noninfective gastroenteritis and colitis: Secondary | ICD-10-CM

## 2014-04-30 DIAGNOSIS — C7800 Secondary malignant neoplasm of unspecified lung: Secondary | ICD-10-CM

## 2014-04-30 DIAGNOSIS — R197 Diarrhea, unspecified: Secondary | ICD-10-CM

## 2014-04-30 DIAGNOSIS — R739 Hyperglycemia, unspecified: Secondary | ICD-10-CM | POA: Diagnosis present

## 2014-04-30 LAB — BASIC METABOLIC PANEL
BUN: 10 mg/dL (ref 6–23)
BUN: 10 mg/dL (ref 6–23)
CALCIUM: 9.8 mg/dL (ref 8.4–10.5)
CO2: 19 mEq/L (ref 19–32)
CO2: 18 meq/L — AB (ref 19–32)
Calcium: 10.2 mg/dL (ref 8.4–10.5)
Chloride: 100 mEq/L (ref 96–112)
Chloride: 100 mEq/L (ref 96–112)
Creatinine, Ser: 0.46 mg/dL — ABNORMAL LOW (ref 0.50–1.10)
Creatinine, Ser: 0.48 mg/dL — ABNORMAL LOW (ref 0.50–1.10)
GFR calc Af Amer: >90 mL/min (ref >90)
GFR calc non Af Amer: >90 mL/min (ref >90)
GLUCOSE: 185 mg/dL — AB (ref 70–99)
Glucose, Bld: 149 mg/dL — ABNORMAL HIGH (ref 70–99)
POTASSIUM: 2.9 meq/L — AB (ref 3.7–5.3)
Potassium: 3 mEq/L — ABNORMAL LOW (ref 3.7–5.3)
Sodium: 139 mEq/L (ref 137–147)
Sodium: 138 mEq/L (ref 137–147)

## 2014-04-30 LAB — POTASSIUM: Potassium: 3.1 mEq/L — ABNORMAL LOW (ref 3.7–5.3)

## 2014-04-30 LAB — LACTIC ACID, PLASMA: Lactic Acid, Venous: 1.1 mmol/L (ref 0.5–2.2)

## 2014-04-30 LAB — CBC
HCT: 41.3 % (ref 36.0–46.0)
Hemoglobin: 14.2 g/dL (ref 12.0–15.0)
MCH: 30.7 pg (ref 26.0–34.0)
MCHC: 34.4 g/dL (ref 30.0–36.0)
MCV: 89.4 fL (ref 78.0–100.0)
PLATELETS: 304 10*3/uL (ref 150–400)
RBC: 4.62 MIL/uL (ref 3.87–5.11)
RDW: 12.9 % (ref 11.5–15.5)
WBC: 9.7 10*3/uL (ref 4.0–10.5)

## 2014-04-30 LAB — HEMOGLOBIN A1C
HEMOGLOBIN A1C: 5.5 % (ref <5.7)
Mean Plasma Glucose: 111 mg/dL (ref <117)

## 2014-04-30 LAB — TSH: TSH: 0.035 u[IU]/mL — ABNORMAL LOW (ref 0.350–4.500)

## 2014-04-30 LAB — KETONES, QUALITATIVE: ACETONE BLD: NEGATIVE

## 2014-04-30 MED ORDER — POTASSIUM CHLORIDE CRYS ER 20 MEQ PO TBCR
40.0000 meq | EXTENDED_RELEASE_TABLET | Freq: Once | ORAL | Status: DC
  Filled 2014-04-30: qty 2

## 2014-04-30 MED ORDER — TRAMADOL HCL 50 MG PO TABS
50.0000 mg | ORAL_TABLET | Freq: Two times a day (BID) | ORAL | Status: DC | PRN
  Administered 2014-04-30 – 2014-05-03 (×5): 50 mg via ORAL
  Filled 2014-04-30 (×5): qty 1

## 2014-04-30 MED ORDER — PROMETHAZINE HCL 25 MG RE SUPP: 25.0000 mg | RECTAL | Status: DC | PRN

## 2014-04-30 MED ORDER — ENOXAPARIN SODIUM 40 MG/0.4ML ~~LOC~~ SOLN
40.0000 mg | SUBCUTANEOUS | Status: DC
  Administered 2014-05-02: 40 mg via SUBCUTANEOUS
  Filled 2014-04-30 (×4): qty 0.4

## 2014-04-30 MED ORDER — PANTOPRAZOLE SODIUM 40 MG PO TBEC
40.0000 mg | DELAYED_RELEASE_TABLET | Freq: Every day | ORAL | Status: DC
Start: 2014-04-30 — End: 2014-05-03
  Administered 2014-04-30 – 2014-05-02 (×3): 40 mg via ORAL
  Filled 2014-04-30 (×4): qty 1

## 2014-04-30 MED ORDER — PROMETHAZINE HCL 25 MG/ML IJ SOLN
25.0000 mg | INTRAMUSCULAR | Status: DC | PRN
  Administered 2014-04-30 – 2014-05-01 (×7): 25 mg via INTRAVENOUS
  Filled 2014-04-30 (×8): qty 1

## 2014-04-30 MED ORDER — ONDANSETRON HCL 4 MG PO TABS: 4.0000 mg | ORAL_TABLET | Freq: Four times a day (QID) | ORAL | Status: DC | PRN

## 2014-04-30 MED ORDER — LORAZEPAM 2 MG/ML IJ SOLN
1.0000 mg | Freq: Once | INTRAMUSCULAR | Status: AC
  Administered 2014-04-30: 1 mg via INTRAVENOUS
  Filled 2014-04-30: qty 1

## 2014-04-30 MED ORDER — ONDANSETRON HCL 4 MG/2ML IJ SOLN: 4.0000 mg | Freq: Three times a day (TID) | INTRAMUSCULAR | Status: DC | PRN

## 2014-04-30 MED ORDER — ONDANSETRON HCL 4 MG/2ML IJ SOLN
4.0000 mg | Freq: Four times a day (QID) | INTRAMUSCULAR | Status: DC | PRN
  Administered 2014-04-30 – 2014-05-02 (×4): 4 mg via INTRAVENOUS
  Filled 2014-04-30 (×4): qty 2

## 2014-04-30 MED ORDER — METRONIDAZOLE IN NACL 5-0.79 MG/ML-% IV SOLN
500.0000 mg | Freq: Three times a day (TID) | INTRAVENOUS | Status: DC
  Administered 2014-04-30 – 2014-05-03 (×11): 500 mg via INTRAVENOUS
  Filled 2014-04-30 (×12): qty 100

## 2014-04-30 MED ORDER — LEVOTHYROXINE SODIUM 125 MCG PO TABS
125.0000 ug | ORAL_TABLET | Freq: Every day | ORAL | Status: DC
  Administered 2014-04-30 – 2014-05-03 (×4): 125 ug via ORAL
  Filled 2014-04-30 (×5): qty 1

## 2014-04-30 MED ORDER — BOOST / RESOURCE BREEZE PO LIQD
1.0000 | Freq: Three times a day (TID) | ORAL | Status: DC
  Administered 2014-04-30 – 2014-05-02 (×6): 1 via ORAL

## 2014-04-30 MED ORDER — CIPROFLOXACIN IN D5W 400 MG/200ML IV SOLN
400.0000 mg | Freq: Two times a day (BID) | INTRAVENOUS | Status: DC
  Administered 2014-04-30 – 2014-05-02 (×7): 400 mg via INTRAVENOUS
  Filled 2014-04-30 (×9): qty 200

## 2014-04-30 MED ORDER — POTASSIUM CHLORIDE 10 MEQ/100ML IV SOLN: 10.0000 meq | INTRAVENOUS | Status: DC

## 2014-04-30 MED ORDER — POTASSIUM CHLORIDE 10 MEQ/100ML IV SOLN
10.0000 meq | INTRAVENOUS | Status: AC
  Administered 2014-04-30 (×4): 10 meq via INTRAVENOUS
  Filled 2014-04-30 (×4): qty 100

## 2014-04-30 MED ORDER — METHOCARBAMOL 500 MG PO TABS: 500.0000 mg | ORAL_TABLET | ORAL | Status: DC | PRN

## 2014-04-30 MED ORDER — SODIUM CHLORIDE 0.9 % IV SOLN
INTRAVENOUS | Status: AC
  Administered 2014-04-30 – 2014-05-01 (×2): via INTRAVENOUS

## 2014-04-30 MED ORDER — ACETAMINOPHEN 650 MG RE SUPP: 650.0000 mg | Freq: Four times a day (QID) | RECTAL | Status: DC | PRN

## 2014-04-30 MED ORDER — LORAZEPAM 2 MG/ML IJ SOLN
1.0000 mg | Freq: Four times a day (QID) | INTRAMUSCULAR | Status: DC | PRN
  Administered 2014-04-30: 1 mg via INTRAVENOUS
  Filled 2014-04-30: qty 1

## 2014-04-30 MED ORDER — ACETAMINOPHEN 325 MG PO TABS
650.0000 mg | ORAL_TABLET | Freq: Four times a day (QID) | ORAL | Status: DC | PRN
  Filled 2014-04-30: qty 2

## 2014-04-30 MED ORDER — PROMETHAZINE HCL 25 MG/ML IJ SOLN
12.5000 mg | Freq: Once | INTRAMUSCULAR | Status: AC
  Administered 2014-04-30: 12.5 mg via INTRAVENOUS
  Filled 2014-04-30: qty 1

## 2014-04-30 NOTE — Progress Notes (Signed)
ANTIBIOTIC CONSULT NOTE - INITIAL  Pharmacy Consult for Cipro Indication: Intra-abdominal infection  Allergies  Allergen Reactions  . Morphine And Related Nausea Only    Patient Measurements: Height: 5\' 7"  (170.2 cm) Weight: 117 lb 8 oz (53.298 kg) IBW/kg (Calculated) : 61.6 Adjusted Body Weight:   Vital Signs: Temp: 99 F (37.2 C) (06/03 0404) Temp src: Oral (06/03 0404) BP: 131/92 mmHg (06/03 0404) Pulse Rate: 99 (06/03 0404) Intake/Output from previous day: 06/02 0701 - 06/03 0700 In: -  Out: 150 [Emesis/NG output:150] Intake/Output from this shift: Total I/O In: -  Out: 150 [Emesis/NG output:150]  Labs:  Recent Labs  04/29/14 1955 04/30/14 0140 04/30/14 0343  WBC 10.5  --  9.7  HGB 16.1*  --  14.2  PLT 367  --  304  CREATININE 0.52 0.46* 0.48*   Estimated Creatinine Clearance: 68.4 ml/min (by C-G formula based on Cr of 0.48). No results found for this basename: VANCOTROUGH, VANCOPEAK, VANCORANDOM, GENTTROUGH, GENTPEAK, GENTRANDOM, TOBRATROUGH, TOBRAPEAK, TOBRARND, AMIKACINPEAK, AMIKACINTROU, AMIKACIN,  in the last 72 hours   Microbiology: No results found for this or any previous visit (from the past 720 hour(s)).  Medical History: Past Medical History  Diagnosis Date  . TMJ (dislocation of temporomandibular joint)   . Thyroid disease   . Lung cancer     awaiting pathology  . Brain cancer     awaiting pathology; assuming this is a lung primary with brain mets  . Dementia   . Hypothyroidism   . GERD (gastroesophageal reflux disease)   . Anxiety     Medications:  Anti-infectives   Start     Dose/Rate Route Frequency Ordered Stop   04/30/14 0145  metroNIDAZOLE (FLAGYL) IVPB 500 mg     500 mg 100 mL/hr over 60 Minutes Intravenous 3 times per day 04/30/14 0138     04/30/14 0145  ciprofloxacin (CIPRO) IVPB 400 mg     400 mg 200 mL/hr over 60 Minutes Intravenous 2 times daily 04/30/14 0143       Assessment: Patient with intra-abdominal  infection.  First dose of antibiotics already given.   Goal of Therapy:  Cipro dosed based on patient weight and renal function   Plan:  Follow up culture results Cipro 400mg  iv q12hr  [employer]. 04/30/2014,5:41 AM

## 2014-04-30 NOTE — H&P (Signed)
[facility] History and Physical  [name] [name] VZD:[id number] DOB: [birth year] DOA: 04/29/2014  Referring physician: ER physician. PCP: Orpah Melter, MD  Chief Complaint: Nausea vomiting and diarrhea.  HPI: [name] [name] is a 53 y.o. female with history of metastatic malignant melanoma, hypothyroidism started experiencing nausea vomiting and diarrhea since last evening. Patient had subjective feeling of fever and chills in the morning. Patient had multiple episodes of vomiting and diarrhea. Does not have any abdominal pain. Patient had similar episode last month and at that time CAT scan showed colitis. Today since patient had multiple episodes in the ER CT abdomen and pelvis was done which did not show any features for colitis but did show possible metastases to the right adrenal gland. Patient at this time has been admitted for further management of nausea vomiting and diarrhea. Patient states she did eat [employer] at [facility] yesterday afternoon. Had some hot dogs last night cooked at home. Patient otherwise denies any chest pain or shortness of breath.   Review of Systems: As presented in the history of presenting illness, rest negative.  Past Medical History  Diagnosis Date  . TMJ (dislocation of temporomandibular joint)   . Thyroid disease   . Lung cancer     awaiting pathology  . Brain cancer     awaiting pathology; assuming this is a lung primary with brain mets  . Dementia   . Hypothyroidism   . GERD (gastroesophageal reflux disease)   . Anxiety    Past Surgical History  Procedure Laterality Date  . Tonsillectomy    . Appendectomy    . Suboccipital craniectomy cervical laminectomy N/A 11/19/2013    Procedure: SUBOCCIPITAL CRANIECTOMY for tumor;  Surgeon: Eustace Moore, MD;  Location: [place] [facility];  Service: Neurosurgery;  Laterality: N/A;  . Lumbar wound debridement N/A 12/05/2013    Procedure: irrigation and debridement posterior cervical wound;  Surgeon: Eustace Moore, MD;  Location: [place] [facility];  Service: Neurosurgery;  Laterality: N/A;  . Video assisted thoracoscopy (vats)/wedge resection Left 03/05/2014    Procedure: VIDEO ASSISTED THORACOSCOPY (VATS)/WEDGE RESECTION;  Surgeon: Melrose Nakayama, MD;  Location: [place];  Service: Thoracic;  Laterality: Left;   Social History:  reports that she quit smoking about 5 months ago. She has never used smokeless tobacco. She reports that she does not drink alcohol or use illicit drugs. Where does patient live home. Can patient participate in ADLs? Yes.  Allergies  Allergen Reactions  . Morphine And Related Nausea Only    Family History:  Family History  Problem Relation Age of Onset  . Dementia Mother   . Cancer - Other Other       Prior to Admission medications   Medication Sig Start Date End Date Taking? Authorizing Provider  levothyroxine (SYNTHROID, LEVOTHROID) 125 MCG tablet Take 125 mcg by mouth daily before breakfast.   Yes Historical Provider, MD  methocarbamol (ROBAXIN) 500 MG tablet Take 500 mg by mouth as needed for muscle spasms.   Yes Historical Provider, MD  omeprazole (PRILOSEC) 10 MG capsule Take 10 mg by mouth daily.   Yes Historical Provider, MD  ondansetron (ZOFRAN) 4 MG tablet Take 1 tablet (4 mg total) by mouth every 6 (six) hours as needed for nausea. 03/20/14  Yes Marianne L York, PA-C  piroxicam (FELDENE) 20 MG capsule Take 20 mg by mouth daily.   Yes Historical Provider, MD  promethazine (PHENERGAN) 25 MG suppository Place 1 suppository (25 mg total) rectally every  6 (six) hours as needed for nausea or vomiting. 03/16/14  Yes Doy Hutching, MD  traMADol (ULTRAM) 50 MG tablet Take 1 tablet (50 mg total) by mouth every 12 (twelve) hours as needed for moderate pain. 04/17/14  Yes Melrose Nakayama, MD    Physical Exam: Filed Vitals:   04/29/14 1940 04/30/14 0106 04/30/14 0121  BP: 156/109 142/89 144/93  Pulse: 67 89 86  Temp: 97.7 F (36.5 C) 98.8 F (37.1 C) 99.1 F  (37.3 C)  TempSrc: Oral Oral Oral  Resp: 18 20 18   Height: 5\' 7"  (1.702 m)  5\' 7"  (1.702 m)  Weight: 54.432 kg (120 lb)  53.298 kg (117 lb 8 oz)  SpO2: 98% 100% 100%     General:  Well-developed and moderately nourished.  Eyes: Anicteric no pallor.  ENT: No discharge from the ears eyes nose mouth.  Neck: No mass felt.  Cardiovascular: S1-S2 heard.  Respiratory: No rhonchi or crepitations.  Abdomen: Soft nontender bowel sounds present. No guarding rigidity.  Skin: No rash.  Musculoskeletal: No edema.  Psychiatric: Appears normal.  Neurologic: Alert awake oriented to time place and person. Moves all extremities.  Labs on Admission:  Basic Metabolic Panel:  Recent Labs Lab 04/29/14 1955  NA 141  K 4.4  CL 99  CO2 18*  GLUCOSE 210*  BUN 11  CREATININE 0.52  CALCIUM 10.9*   Liver Function Tests:  Recent Labs Lab 04/29/14 1955  AST 27  ALT 19  ALKPHOS 120*  BILITOT 0.8  PROT 8.2  ALBUMIN 4.6    Recent Labs Lab 04/29/14 1955  LIPASE 7*   No results found for this basename: AMMONIA,  in the last 168 hours CBC:  Recent Labs Lab 04/29/14 1955  WBC 10.5  NEUTROABS 9.2*  HGB 16.1*  HCT 46.9*  MCV 91.1  PLT 367   Cardiac Enzymes: No results found for this basename: CKTOTAL, CKMB, CKMBINDEX, TROPONINI,  in the last 168 hours  BNP (last 3 results) No results found for this basename: PROBNP,  in the last 8760 hours CBG: No results found for this basename: GLUCAP,  in the last 168 hours  Radiological Exams on Admission: Ct Abdomen Pelvis W Contrast  04/30/2014   CLINICAL DATA:  Vomiting.  History of colitis.  History of melanoma.  EXAM: CT ABDOMEN AND PELVIS WITH CONTRAST  TECHNIQUE: Multidetector CT imaging of the abdomen and pelvis was performed using the standard protocol following bolus administration of intravenous contrast.  CONTRAST:  112mL OMNIPAQUE IOHEXOL 300 MG/ML  SOLN  COMPARISON:  03/17/2014  FINDINGS: BODY WALL: Unremarkable.  LOWER  CHEST: 5 mm subpleural nodule in the right lower lobe is stable from previous. There is chronic scarring in the medial segment right middle lobe and in the posterior left lower lobe.  ABDOMEN/PELVIS:  Liver: Subcapsular low-density in the lower right lobe liver is stable from enhanced CT 01/25/2014 and likely a benign incidental cyst.  Biliary: No evidence of biliary obstruction or stone.  Pancreas: Unremarkable.  Spleen: Unremarkable.  Adrenals: New right adrenal nodule, 15 mm in diameter.  Kidneys and ureters: No hydronephrosis or nephrolithiasis. Ill-defined hypoenhancement in the upper and interpolar left kidney are nonspecific due to small size.  Bladder: Unremarkable.  Reproductive: Unremarkable.  Bowel: No obstruction. No evidence of enterocolitis. No pericecal inflammation.  Retroperitoneum: No mass or adenopathy.  Peritoneum: No free fluid or gas.  Vascular: No acute abnormality.  OSSEOUS: No acute abnormalities.  IMPRESSION: 1. No acute intra-abdominal findings. 2. New  15 mm right adrenal nodule, a probable melanoma metastasis.   Electronically Signed   By: Jorje Guild M.D.   On: 04/30/2014 00:15     Assessment/Plan Principal Problem:   Nausea vomiting and diarrhea Active Problems:   Hypothyroid   Metastatic melanoma to lung   Hyperglycemia   1. Nausea vomiting and diarrhea - most likely secondary to gastroenteritis. Check stool cultures and C. difficile. Continue with hydration. Since patient was having subjective feeling of fever chills and diaphoresis I have ordered blood cultures and place patient on Cipro and Flagyl. 2. Metabolic acidosis - probably from nausea vomiting and diarrhea. Since patient has hyperglycemia I have ordered acetone levels but I don't think patient is in DKA. Check lactic acid levels. Closely follow metabolic panel with hydration. 3. Hyperglycemia - check hemoglobin A1c. See #2. 4. Mild hypercalcemia - probably from dehydration. Recheck metabolic panel after  hydration. 5. Metastatic malignant melanoma - patient has a new right-sided adrenal lesion. Patient is aware of the result. Patient is following up with oncologist and is about to enroll for study at [facility]. 6. Hypothyroidism - on Synthroid.    Code Status: Full code.  Family Communication: Patient's family at the bedside.  Disposition Plan: Admit to inpatient.    [name] Pager [phone].  If 7PM-7AM, please contact night-coverage [email or web address] Password [email or web address] 04/30/2014, 1:38 AM

## 2014-04-30 NOTE — Progress Notes (Addendum)
Patient ID: [name] [name], female   DOB: [birth year], 53 y.o.   MRN: [id number] [facility] PROGRESS NOTE  [name] [name] EUM:[id number] DOB: 03/06/1961 DOA: 04/29/2014 PCP: Orpah Melter, MD  Brief narrative: 53 y.o. female with past medical history of metastatic malignant melanoma, hypothyroidism, previous history of colitis who presented to [facility] ED 04/29/2014 with intractable nausea, vomiting, abdominal pain and diarrhea. CT abdomen on admission showed no acute intra-abdominal findings but there was a new 15 mm right adrenal nodule concerning for probable metastatic melanoma. Patient was started on Cipro and Flagyl for treatment of possible colitis.  Assessment/Plan:  Principal Problem:   Nausea, vomiting and diarrhea  Diarrhea resolved but patient continues to have nausea and vomiting  We will continue clear liquid diet; we will add Phenergan in addition to Zofran because patient reports Phenergan to provide better relief  Continue IV fluids for next 24 hours until by mouth intake improves  We will continue treatment with Flagyl and Cipro for possible colitis. Patient reports that the symptoms she is experiencing are pretty much like what she experienced when she had colitis. CT abdomen pelvis did not reveal findings of colitis. Active Problems:   Hypothyroid  Continue Synthroid   Metastatic melanoma to lung  Patient reports she will go to [facility] for research study. She has been followed by Dr. Earlie Server in [place]. Hypokalemia  Likely due to GI losses  repleted    DVT prophylaxis: Lovenox subQ  Code Status: full code  Family Communication: plan of care discussed with the patient and her husband at the bedside  Disposition Plan: home when stable   Robbie Lis, MD  [facility] Pager [phone]  If 7PM-7AM, please contact night-coverage [email or web address] Password [email or web address] 04/30/2014, 1:12 PM   LOS: 1 day   Consultants:  None   Procedures:  None    Antibiotics:  Cipro 04/30/2014 -->  Flagyl 04/30/2014 -->  HPI/Subjective: No acute overnight events.  Objective: Filed Vitals:   04/29/14 1940 04/30/14 0106 04/30/14 0121 04/30/14 0404  BP: 156/109 142/89 144/93 131/92  Pulse: 67 89 86 99  Temp: 97.7 F (36.5 C) 98.8 F (37.1 C) 99.1 F (37.3 C) 99 F (37.2 C)  TempSrc: Oral Oral Oral Oral  Resp: 18 20 18 19   Height: 5\' 7"  (1.702 m)  5\' 7"  (1.702 m)   Weight: 54.432 kg (120 lb)  53.298 kg (117 lb 8 oz)   SpO2: 98% 100% 100% 100%    Intake/Output Summary (Last 24 hours) at 04/30/14 1312 Last data filed at 04/30/14 0254  Gross per 24 hour  Intake      0 ml  Output    150 ml  Net   -150 ml    Exam:   General:  Pt is alert, follows commands appropriately, not in acute distress  Cardiovascular: Regular rate and rhythm, S1/S2, no murmurs  Respiratory: Clear to auscultation bilaterally, no wheezing  Abdomen: Soft, tender in epigastric area, non distended, bowel sounds present  Extremities: No edema, pulses DP and PT palpable bilaterally  Neuro: Grossly nonfocal  Data Reviewed: Basic Metabolic Panel:  Recent Labs Lab 04/29/14 1955 04/30/14 0140 04/30/14 0343 04/30/14 1000  NA 141 139 138  --   K 4.4 2.9* 3.0* 3.1*  CL 99 100 100  --   CO2 18* 19 18*  --   GLUCOSE 210* 149* 185*  --   BUN 11 10 10   --   CREATININE 0.52 0.46* 0.48*  --  CALCIUM 10.9* 10.2 9.8  --    Liver Function Tests:  Recent Labs Lab 04/29/14 1955  AST 27  ALT 19  ALKPHOS 120*  BILITOT 0.8  PROT 8.2  ALBUMIN 4.6    Recent Labs Lab 04/29/14 1955  LIPASE 7*   No results found for this basename: AMMONIA,  in the last 168 hours CBC:  Recent Labs Lab 04/29/14 1955 04/30/14 0343  WBC 10.5 9.7  NEUTROABS 9.2*  --   HGB 16.1* 14.2  HCT 46.9* 41.3  MCV 91.1 89.4  PLT 367 304   Cardiac Enzymes: No results found for this basename: CKTOTAL, CKMB, CKMBINDEX, TROPONINI,  in the last 168 hours BNP: No components found  with this basename: POCBNP,  CBG: No results found for this basename: GLUCAP,  in the last 168 hours  No results found for this or any previous visit (from the past 240 hour(s)).   Studies: Ct Abdomen Pelvis W Contrast 04/30/2014    IMPRESSION: 1. No acute intra-abdominal findings. 2. New 15 mm right adrenal nodule, a probable melanoma metastasis.      Scheduled Meds: . ciprofloxacin  400 mg Intravenous BID  . enoxaparin (LOVENOX) injection  40 mg Subcutaneous Q24H  . feeding supplement (RESOURCE BREEZE)  1 Container Oral TID BM  . levothyroxine  125 mcg Oral QAC breakfast  . metronidazole  500 mg Intravenous 3 times per day  . pantoprazole  40 mg Oral Daily   Continuous Infusions: . sodium chloride 100 mL/hr at 04/30/14 [phone]

## 2014-04-30 NOTE — Progress Notes (Signed)
INITIAL NUTRITION ASSESSMENT  DOCUMENTATION CODES Per approved criteria  -Underweight   INTERVENTION: -Recommend Resource Breeze po TID, each supplement provides 250 kcal and 9 grams of protein -Add snacks as diet advancement tolerated -Consider addition of appetite stimulant d/t prolonged period of sub-optimal nutrition -Will continue to monitor  NUTRITION DIAGNOSIS: Inadequate oral intake related to nausea/vomiting/loss of taste as evidenced by PO intake <75% for > one month.   Goal: Pt to meet >/= 90% of their estimated nutrition needs    Monitor:  Total protein/energy intake, diet order, labs, weights, diet education needs  Reason for Assessment: MST/Underweight BMI  53 y.o. female  Admitting Dx: Nausea vomiting and diarrhea  ASSESSMENT: [name] [name] is a 53 y.o. female with history of metastatic malignant melanoma, hypothyroidism started experiencing nausea vomiting and diarrhea since last evening. Patient had subjective feeling of fever and chills in the morning. Patient had multiple episodes of vomiting and diarrhea. Does not have any abdominal pain. Patient had similar episode last month and at that time CAT scan showed colitis. Today since patient had multiple episodes in the ER CT abdomen and pelvis was done which did not show any features for colitis but did show possible metastases to the right adrenal gland. Patient at this time has been admitted for further management of nausea vomiting and diarrhea  -Pt reported decreased appetite since 02/2014. Diet recall indicates pt consuming small snack-like meals every few hours. Family has been encouraging pt's appetite -Has not been implementing supplement regimen as she reported they have never been recommended for her. Has tried Ensure before occasionally but often cannot tolerate as they induce nausea -Reported to have lost weight previously, but has recently been able to maintain weight around 120 lbs -Noted minimal  intake since Sunday 5/31 d/t frequent episodes of nausea/vomiting -Is on clear liquid diet currently, tolerating sips and jello. Has tried [employer] during previous admit in 02/2014 and enjoyed taste over Ensure/Boost. Will order with meals for nutrient replenishment -Pt interested in further nutrition education for weight gain, will monitor diet order and add high protein/kcal nourishments as tolerated -MD noted pt with possible gastroenteritis -K low-being replaced with potassium chloride.   Height: Ht Readings from Last 1 Encounters:  04/30/14 5\' 7"  (1.702 m)    Weight: Wt Readings from Last 1 Encounters:  04/30/14 117 lb 8 oz (53.298 kg)    Ideal Body Weight: 135 lbs  % Ideal Body Weight: 87%  Wt Readings from Last 10 Encounters:  04/30/14 117 lb 8 oz (53.298 kg)  04/04/14 124 lb 4.8 oz (56.382 kg)  03/25/14 114 lb (51.71 kg)  03/18/14 114 lb 13.8 oz (52.1 kg)  03/16/14 120 lb (54.432 kg)  03/05/14 146 lb 6.2 oz (66.4 kg)  03/05/14 146 lb 6.2 oz (66.4 kg)  03/03/14 126 lb 8.7 oz (57.4 kg)  02/20/14 133 lb 9.6 oz (60.601 kg)  02/20/14 133 lb 4.8 oz (60.464 kg)    Usual Body Weight: 120 lbs  % Usual Body Weight: 98%  BMI:  Body mass index is 18.4 kg/(m^2). Underweight  Estimated Nutritional Needs: Kcal: 1800-2000 Protein: 75-90 gram Fluid: >/=1800 ml/daily  Skin: WDL  Diet Order: Clear Liquid  EDUCATION NEEDS: -Education not appropriate at this time   Intake/Output Summary (Last 24 hours) at 04/30/14 1114 Last data filed at 04/30/14 0254  Gross per 24 hour  Intake      0 ml  Output    150 ml  Net   -150 ml  Last BM: 5/31   Labs:   Recent Labs Lab 04/29/14 1955 04/30/14 0140 04/30/14 0343 04/30/14 1000  NA 141 139 138  --   K 4.4 2.9* 3.0* 3.1*  CL 99 100 100  --   CO2 18* 19 18*  --   BUN 11 10 10   --   CREATININE 0.52 0.46* 0.48*  --   CALCIUM 10.9* 10.2 9.8  --   GLUCOSE 210* 149* 185*  --     CBG (last 3)  No results found  for this basename: GLUCAP,  in the last 72 hours  Scheduled Meds: . ciprofloxacin  400 mg Intravenous BID  . enoxaparin (LOVENOX) injection  40 mg Subcutaneous Q24H  . feeding supplement (RESOURCE BREEZE)  1 Container Oral TID BM  . levothyroxine  125 mcg Oral QAC breakfast  . metronidazole  500 mg Intravenous 3 times per day  . pantoprazole  40 mg Oral Daily    Continuous Infusions: . sodium chloride 100 mL/hr at 04/30/14 8616    Past Medical History  Diagnosis Date  . TMJ (dislocation of temporomandibular joint)   . Thyroid disease   . Lung cancer     awaiting pathology  . Brain cancer     awaiting pathology; assuming this is a lung primary with brain mets  . Dementia   . Hypothyroidism   . GERD (gastroesophageal reflux disease)   . Anxiety     Past Surgical History  Procedure Laterality Date  . Tonsillectomy    . Appendectomy    . Suboccipital craniectomy cervical laminectomy N/A 11/19/2013    Procedure: SUBOCCIPITAL CRANIECTOMY for tumor;  Surgeon: Eustace Moore, MD;  Location: [place] [facility];  Service: Neurosurgery;  Laterality: N/A;  . Lumbar wound debridement N/A 12/05/2013    Procedure: irrigation and debridement posterior cervical wound;  Surgeon: Eustace Moore, MD;  Location: [place] [facility];  Service: Neurosurgery;  Laterality: N/A;  . Video assisted thoracoscopy (vats)/wedge resection Left 03/05/2014    Procedure: VIDEO ASSISTED THORACOSCOPY (VATS)/WEDGE RESECTION;  Surgeon: Melrose Nakayama, MD;  Location: [place];  Service: Thoracic;  Laterality: Left;    Atlee Abide MS RD LDN Clinical Dietitian [phone]

## 2014-04-30 NOTE — Progress Notes (Addendum)
CRITICAL VALUE ALERT  Critical value received:  Potassium 2.8  Date of notification:  04/30/14  Time of notification:  0245  Critical value read back:yes  Nurse who received alert:  Adline Peals RN  MD notified (1st page):  Chaney Malling NP  Time of first page:  0252  Responding MD:  Chaney Malling NP  Time MD responded:  [phone] Orders received for potassium runs. Noreene Larsson RN   NP changed order from IV KCL runs from 5 to 4 based on potassium level of 2.9. Noreene Larsson RN

## 2014-05-01 MED ORDER — LORAZEPAM 2 MG/ML IJ SOLN
1.0000 mg | Freq: Two times a day (BID) | INTRAMUSCULAR | Status: DC | PRN
  Administered 2014-05-01 – 2014-05-02 (×3): 1 mg via INTRAVENOUS
  Filled 2014-05-01 (×3): qty 1

## 2014-05-01 NOTE — Progress Notes (Signed)
Patient ID: [name], female   DOB: December 23, 1960, 53 y.o.   MRN: [id number] [facility] PROGRESS NOTE  KESTREL MIS [id number] DOB: 27-Jul-1961 DOA: 04/29/2014 PCP: Orpah Melter, MD  Brief narrative: 53 y.o. female with past medical history of metastatic malignant melanoma, hypothyroidism, previous history of colitis who presented to [facility] ED 04/29/2014 with intractable nausea, vomiting, abdominal pain and diarrhea. CT abdomen on admission showed no acute intra-abdominal findings but there was a new 15 mm right adrenal nodule concerning for probable metastatic melanoma.  Patient was started on Cipro and Flagyl for treatment of possible colitis. I am not convinced that she has colitis but pt is adamant that this is how her symptoms were when she had colitis. She is very anxious. She is asking for having ativan on discharge. Her sister at the bedside is inquiring for pt to have ativan on discharge. She is also requesting endocrinology evaluation for adrenal met but have explained to her endo does not come to hospital rather we can provide outpt referral. She was also inquiring how pt can come to hospital without going through ED.  Assessment/Plan:   Principal Problem:  Nausea, vomiting and diarrhea  Diarrhea resolved but patient continues to have nausea and vomiting.. No witness vomiting however.Pt reported she forgot to let the RN know. I have reinforced that she needs to let the RN know of vomiting.  We will continue clear liquid diet; Zofran and phenergan as needed Continue IV fluids until PO intake improves. We will continue treatment with Flagyl and Cipro for possible colitis. Active Problems:  Hypothyroid  Continue Synthroid Metastatic melanoma to lung  Patient reports she will go to [facility] for research study. She has been followed by Dr. Earlie Server in [place]. Hypokalemia  Likely due to GI losses  repleted    DVT prophylaxis: Lovenox subQ    Code Status: full code   Family Communication: plan of care discussed with the patient and her husband at the bedside; updated and met with the pt sister as well. Disposition Plan: home when stable   Consultants:  None  Procedures:  None  Antibiotics:  Cipro 04/30/2014 -->  Flagyl 04/30/2014 -->   Robbie Lis, MD  [facility] Pager [phone]  If 7PM-7AM, please contact night-coverage [email or web address] Password [email or web address] 05/01/2014, 1:56 PM   LOS: 2 days    HPI/Subjective: No acute overnight events.  Objective: Filed Vitals:   04/30/14 0404 04/30/14 1329 04/30/14 2118 05/01/14 0543  BP: 131/92 131/77 120/85 118/61  Pulse: 99 115 114 108  Temp: 99 F (37.2 C) 98.2 F (36.8 C) 99.1 F (37.3 C) 98.6 F (37 C)  TempSrc: Oral Oral Oral Oral  Resp: _0 Height:      Weight:      SpO2: 100% 100% 97% 98%    Intake/Output Summary (Last 24 hours) at 05/01/14 1356 Last data filed at 05/01/14 0600  Gross per 24 hour  Intake 2631.66 ml  Output      0 ml  Net 2631.66 ml    Exam:   General:  Pt is alert, follows commands appropriately  Cardiovascular: Regular rate and rhythm, S1/S2 appreciated   Respiratory: Clear to auscultation bilaterally  Abdomen: Soft, non tender, non distended, bowel sounds present  Extremities: No edema, pulses DP and PT palpable bilaterally  Neuro: Grossly nonfocal  Data Reviewed: Basic Metabolic Panel:  Recent Labs Lab 04/29/14 1955 04/30/14 0140 04/30/14 0343 04/30/14 1000  NA 141 139 138  --  K 4.4 2.9* 3.0* 3.1*  CL 99 100 100  --   CO2 18* 19 18*  --   GLUCOSE 210* 149* 185*  --   BUN _0 --   CREATININE 0.52 0.46* 0.48*  --   CALCIUM 10.9* 10.2 9.8  --    Liver Function Tests:  Recent Labs Lab 04/29/14 1955  AST 27  ALT 19  ALKPHOS 120*  BILITOT 0.8  PROT 8.2  ALBUMIN 4.6    Recent Labs Lab 04/29/14 1955  LIPASE 7*   No results found for this basename: AMMONIA,  in the last 168 hours CBC:  Recent Labs Lab  04/29/14 1955 04/30/14 0343  WBC 10.5 9.7  NEUTROABS 9.2*  --   HGB 16.1* 14.2  HCT 46.9* 41.3  MCV 91.1 89.4  PLT 367 304   Cardiac Enzymes: No results found for this basename: CKTOTAL, CKMB, CKMBINDEX, TROPONINI,  in the last 168 hours BNP: No components found with this basename: POCBNP,  CBG: No results found for this basename: GLUCAP,  in the last 168 hours  Recent Results (from the past 240 hour(s))  CULTURE, BLOOD (ROUTINE X 2)     Status: None   Collection Time    04/30/14  1:40 AM      Result Value Ref Range Status   Specimen Description BLOOD RIGHT HAND   Final   Special Requests BOTTLES DRAWN AEROBIC ONLY 6CC   Final   Culture  Setup Time     Final   Value: 04/30/2014 03:58     Performed at [facility]   Culture     Final   Value:        BLOOD CULTURE RECEIVED NO GROWTH TO DATE CULTURE WILL BE HELD FOR 5 DAYS BEFORE ISSUING A FINAL NEGATIVE REPORT     Performed at [facility]   Report Status PENDING   Incomplete  CULTURE, BLOOD (ROUTINE X 2)     Status: None   Collection Time    04/30/14  1:47 AM      Result Value Ref Range Status   Specimen Description BLOOD LEFT HAND   Final   Special Requests BOTTLES DRAWN AEROBIC ONLY 5CC   Final   Culture  Setup Time     Final   Value: 04/30/2014 03:58     Performed at [facility]   Culture     Final   Value:        BLOOD CULTURE RECEIVED NO GROWTH TO DATE CULTURE WILL BE HELD FOR 5 DAYS BEFORE ISSUING A FINAL NEGATIVE REPORT     Performed at [facility]   Report Status PENDING   Incomplete     Studies: Ct Abdomen Pelvis W Contrast 04/30/2014    IMPRESSION: 1. No acute intra-abdominal findings. 2. New 15 mm right adrenal nodule, a probable melanoma metastasis.     Scheduled Meds: . ciprofloxacin  400 mg Intravenous BID  . enoxaparin (LOVENOX)   40 mg Subcutaneous Q24H  . feeding supplement (RESOURCE BREEZE)  1 Container Oral TID BM  . levothyroxine  125 mcg Oral QAC breakfast  .  metronidazole  500 mg Intravenous 3 times per day  . pantoprazole  40 mg Oral Daily

## 2014-05-01 NOTE — Progress Notes (Signed)
One episode of brown emesis - 150cc.

## 2014-05-01 NOTE — Progress Notes (Signed)
75 ml brownish emesis

## 2014-05-02 DIAGNOSIS — R911 Solitary pulmonary nodule: Secondary | ICD-10-CM

## 2014-05-02 LAB — BASIC METABOLIC PANEL
BUN: 7 mg/dL (ref 6–23)
CALCIUM: 9.4 mg/dL (ref 8.4–10.5)
CHLORIDE: 98 meq/L (ref 96–112)
CO2: 24 meq/L (ref 19–32)
CREATININE: 0.5 mg/dL (ref 0.50–1.10)
GFR calc non Af Amer: >90 mL/min (ref >90)
Glucose, Bld: 128 mg/dL — ABNORMAL HIGH (ref 70–99)
Potassium: 2.7 mEq/L — CL (ref 3.7–5.3)
Sodium: 138 mEq/L (ref 137–147)

## 2014-05-02 LAB — POTASSIUM: Potassium: 2.8 mEq/L — CL (ref 3.7–5.3)

## 2014-05-02 MED ORDER — SODIUM CHLORIDE 0.9 % IV SOLN
INTRAVENOUS | Status: DC
  Administered 2014-05-02 – 2014-05-03 (×2): via INTRAVENOUS
  Filled 2014-05-02 (×4): qty 1000

## 2014-05-02 MED ORDER — POTASSIUM CHLORIDE 10 MEQ/100ML IV SOLN
10.0000 meq | INTRAVENOUS | Status: DC
  Filled 2014-05-02 (×4): qty 100

## 2014-05-02 MED ORDER — POTASSIUM CHLORIDE 10 MEQ/100ML IV SOLN
10.0000 meq | INTRAVENOUS | Status: DC
  Administered 2014-05-02: 10 meq via INTRAVENOUS
  Filled 2014-05-02 (×5): qty 100

## 2014-05-02 NOTE — Progress Notes (Signed)
CRITICAL VALUE ALERT  Critical value received: POTASSIUM 2.7  Date of notification:  05/02/14  Time of notification:  0430  Critical value read back:YES  Nurse who received alert: Lottie Dawson  MD notified (1st page):K .[facility] ,NP  Time of first page:  0440  MD notified (2nd page):  Time of second page:  Responding MD:K . [facility] ,NP  Time MD responded:[id number]

## 2014-05-02 NOTE — Progress Notes (Signed)
Patient ID: [name] [name], female   DOB: 12-11-1960, 53 y.o.   MRN: [id number] [facility] PROGRESS NOTE  AMALA PETION [id number]:[id number] DOB: Dec 02, 1960 DOA: 04/29/2014 PCP: Orpah Melter, MD  Brief narrative: 53 y.o. female with past medical history of metastatic malignant melanoma, hypothyroidism, previous history of colitis who presented to [facility] ED 04/29/2014 with intractable nausea, vomiting, abdominal pain and diarrhea. CT abdomen on admission showed no acute intra-abdominal findings but there was a new 15 mm right adrenal nodule concerning for probable metastatic melanoma.  Patient was started on Cipro and Flagyl for treatment of possible colitis. I am not convinced that she has colitis but pt is adamant that this is how her symptoms were when she had colitis. She is very anxious. She is asking for having ativan on discharge. Her sister at the bedside is inquiring for pt to have ativan on discharge. She is also requesting endocrinology evaluation for adrenal met but have explained to her endo does not come to hospital rather we can provide outpt referral. She was also inquiring how pt can come to hospital without going through ED.   Assessment/Plan:   Principal Problem:  Nausea, vomiting and diarrhea  Patient feels better this morning. She tolerated some solid food this morning. She still doesn't have a very good by mouth intake so we'll continue IV fluids with potassium supplementation. She is getting Zofran as needed for nausea or vomiting. For refractory nausea or vomiting she is getting Phenergan and for breakthrough nausea or vomiting she is getting Ativan when necessary. Continue Cipro and Flagyl for treatment of possible colitis.  Active Problems:  Hypothyroid  Continue Synthroid Metastatic melanoma to lung  Patient reports she will go to [facility] for research study. She has been followed by Dr. Earlie Server in [place]. Hypokalemia  Likely due to GI losses  Being repleted in  IV fluids.   DVT prophylaxis: Lovenox subQ   Code Status: full code  Family Communication: plan of care discussed with the patient and her husband at the bedside; updated and met with the pt sister as well.  Disposition Plan: home when stable   Consultants:  None  Procedures:  None  Antibiotics:  Cipro 04/30/2014 -->  Flagyl 04/30/2014 -->    Robbie Lis, MD  [facility] Pager [phone]  If 7PM-7AM, please contact night-coverage [email or web address] Password [email or web address] 05/02/2014, 12:26 PM   LOS: 3 days    HPI/Subjective: No acute overnight events.  Objective: Filed Vitals:   05/01/14 0543 05/01/14 1500 05/01/14 2155 05/02/14 0558  BP: 118/61 113/61 123/75 95/66  Pulse: 108 105 113 111  Temp: 98.6 F (37 C) 98.2 F (36.8 C) 99 F (37.2 C) 98.7 F (37.1 C)  TempSrc: Oral Oral Oral Oral  Resp: 18 16 16 14   Height:      Weight:      SpO2: 98% 100% 100% 98%    Intake/Output Summary (Last 24 hours) at 05/02/14 1226 Last data filed at 05/02/14 0900  Gross per 24 hour  Intake   1317 ml  Output    225 ml  Net   1092 ml    Exam:   General:  Pt is alert, no distress  Cardiovascular: Regular rate and rhythm, S1/S2, no murmurs  Respiratory: Clear to auscultation bilaterally, no wheezing, no crackles  Abdomen: Soft, non tender, non distended, bowel sounds present  Extremities: No edema, pulses DP and PT palpable bilaterally  Neuro: Grossly nonfocal  Data Reviewed: Basic Metabolic Panel:  Recent  Labs Lab 04/29/14 1955 04/30/14 0140 04/30/14 0343 04/30/14 1000 05/02/14 0341  NA 141 139 138  --  138  K 4.4 2.9* 3.0* 3.1* 2.7*  CL 99 100 100  --  98  CO2 18* 19 18*  --  24  GLUCOSE 210* 149* 185*  --  128*  BUN 11 10 10   --  7  CREATININE 0.52 0.46* 0.48*  --  0.50  CALCIUM 10.9* 10.2 9.8  --  9.4   Liver Function Tests:  Recent Labs Lab 04/29/14 1955  AST 27  ALT 19  ALKPHOS 120*  BILITOT 0.8  PROT 8.2  ALBUMIN 4.6    Recent Labs Lab  04/29/14 1955  LIPASE 7*   No results found for this basename: AMMONIA,  in the last 168 hours CBC:  Recent Labs Lab 04/29/14 1955 04/30/14 0343  WBC 10.5 9.7  NEUTROABS 9.2*  --   HGB 16.1* 14.2  HCT 46.9* 41.3  MCV 91.1 89.4  PLT 367 304   Cardiac Enzymes: No results found for this basename: CKTOTAL, CKMB, CKMBINDEX, TROPONINI,  in the last 168 hours BNP: No components found with this basename: POCBNP,  CBG: No results found for this basename: GLUCAP,  in the last 168 hours  Recent Results (from the past 240 hour(s))  CULTURE, BLOOD (ROUTINE X 2)     Status: None   Collection Time    04/30/14  1:40 AM      Result Value Ref Range Status   Specimen Description BLOOD RIGHT HAND   Final   Special Requests BOTTLES DRAWN AEROBIC ONLY 6CC   Final   Culture  Setup Time     Final   Value: 04/30/2014 03:58     Performed at [facility]   Culture     Final   Value:        BLOOD CULTURE RECEIVED NO GROWTH TO DATE CULTURE WILL BE HELD FOR 5 DAYS BEFORE ISSUING A FINAL NEGATIVE REPORT     Performed at [facility]   Report Status PENDING   Incomplete  CULTURE, BLOOD (ROUTINE X 2)     Status: None   Collection Time    04/30/14  1:47 AM      Result Value Ref Range Status   Specimen Description BLOOD LEFT HAND   Final   Special Requests BOTTLES DRAWN AEROBIC ONLY 5CC   Final   Culture  Setup Time     Final   Value: 04/30/2014 03:58     Performed at [facility]   Culture     Final   Value:        BLOOD CULTURE RECEIVED NO GROWTH TO DATE CULTURE WILL BE HELD FOR 5 DAYS BEFORE ISSUING A FINAL NEGATIVE REPORT     Performed at [facility]   Report Status PENDING   Incomplete     Studies: No results found.  Scheduled Meds: . ciprofloxacin  400 mg Intravenous BID  . enoxaparin (LOVENOX) injection  40 mg Subcutaneous Q24H  . feeding supplement (  1 Container Oral TID BM  . levothyroxine  125 mcg Oral QAC breakfast  . metronidazole  500 mg  Intravenous 3 times per day  . pantoprazole  40 mg Oral Daily   Continuous Infusions: . sodium chloride 0.9 % 1,000 mL with potassium chloride 40 mEq infusion 75 mL/hr at 05/02/14 [phone]

## 2014-05-02 NOTE — Progress Notes (Signed)
NUTRITION FOLLOW UP  Intervention:   -Recommend 8PM nourishment -Continue with Resource Breeze -Discussed ways to increase pallatability of supplements  Nutrition Dx:   Inadequate oral intake related to nausea/vomiting/loss of taste as evidenced by PO intake <75% for > one month-ongoing    Goal:   Pt to meet >/= 90% of their estimated nutrition needs    Monitor:   Total protein/energy intake, labs, weights, GI profile  Assessment:   -Pt reported decreased appetite since 02/2014. Diet recall indicates pt consuming small snack-like meals every few hours. Family has been encouraging pt's appetite  -Has not been implementing supplement regimen as she reported they have never been recommended for her. Has tried Ensure before occasionally but often cannot tolerate as they induce nausea  -Reported to have lost weight previously, but has recently been able to maintain weight around 120 lbs  -Noted minimal intake since Sunday 5/31 d/t frequent episodes of nausea/vomiting  -Is on clear liquid diet currently, tolerating sips and jello. Has tried [employer] during previous admit in 02/2014 and enjoyed taste over Ensure/Boost. Will order with meals for nutrient replenishment  -Pt interested in further nutrition education for weight gain, will monitor diet order and add high protein/kcal nourishments as tolerated  -MD noted pt with possible gastroenteritis  -K low-being replaced with potassium chloride.    6/05: -Diet advancement on 6/04 to regular diet -Pt resting during time of assessment, obtained information from family -Unable to tolerate solid foods d/t nausea/vomiting. Tolerating sips of [employer] and [employer]. Encouraged ways to improve taste of supplement.  -Discussed addition of protein/kcal snacks. Provided pt with graham crackers and peanut butter. Was also willing to implement 8PM nourishment of chicken salad sandwich for additional nutrient replenishment -Receiving Zofran for  nausea -MD noted pt for possible colitis -Low K being replaced with IVF  Height: Ht Readings from Last 1 Encounters:  04/30/14 5\' 7"  (1.702 m)    Weight Status:   Wt Readings from Last 1 Encounters:  04/30/14 117 lb 8 oz (53.298 kg)    Re-estimated needs:  Kcal: 1800-2000  Protein: 75-90 gram  Fluid: >/=1800 ml/daily  Skin: WDL  Diet Order: General   Intake/Output Summary (Last 24 hours) at 05/02/14 1507 Last data filed at 05/02/14 0900  Gross per 24 hour  Intake   1317 ml  Output    225 ml  Net   1092 ml    Last BM: 5/31   Labs:   Recent Labs Lab 04/30/14 0140 04/30/14 0343 04/30/14 1000 05/02/14 0341 05/02/14 1326  NA 139 138  --  138  --   K 2.9* 3.0* 3.1* 2.7* 2.8*  CL 100 100  --  98  --   CO2 19 18*  --  24  --   BUN 10 10  --  7  --   CREATININE 0.46* 0.48*  --  0.50  --   CALCIUM 10.2 9.8  --  9.4  --   GLUCOSE 149* 185*  --  128*  --     CBG (last 3)  No results found for this basename: GLUCAP,  in the last 72 hours  Scheduled Meds: . ciprofloxacin  400 mg Intravenous BID  . enoxaparin (LOVENOX) injection  40 mg Subcutaneous Q24H  . feeding supplement (RESOURCE BREEZE)  1 Container Oral TID BM  . levothyroxine  125 mcg Oral QAC breakfast  . metronidazole  500 mg Intravenous 3 times per day  . pantoprazole  40 mg Oral  Daily    Continuous Infusions: . sodium chloride 0.9 % 1,000 mL with potassium chloride 40 mEq infusion 75 mL/hr at 05/02/14 Lee LDN Clinical Dietitian [phone]

## 2014-05-03 LAB — BASIC METABOLIC PANEL
BUN: 13 mg/dL (ref 6–23)
CALCIUM: 8.4 mg/dL (ref 8.4–10.5)
CO2: 22 mEq/L (ref 19–32)
CREATININE: 0.56 mg/dL (ref 0.50–1.10)
Chloride: 104 mEq/L (ref 96–112)
GFR calc non Af Amer: >90 mL/min (ref >90)
Glucose, Bld: 85 mg/dL (ref 70–99)
Potassium: 3.3 mEq/L — ABNORMAL LOW (ref 3.7–5.3)
SODIUM: 138 meq/L (ref 137–147)

## 2014-05-03 MED ORDER — POTASSIUM CHLORIDE ER 20 MEQ PO TBCR: 10.0000 meq | EXTENDED_RELEASE_TABLET | Freq: Every day | ORAL | Status: DC

## 2014-05-03 MED ORDER — LORAZEPAM 1 MG PO TABS: 1.0000 mg | ORAL_TABLET | Freq: Two times a day (BID) | ORAL | Status: DC | PRN

## 2014-05-03 MED ORDER — PROMETHAZINE HCL 25 MG RE SUPP: 25.0000 mg | Freq: Four times a day (QID) | RECTAL | Status: DC | PRN

## 2014-05-03 MED ORDER — POTASSIUM CHLORIDE CRYS ER 20 MEQ PO TBCR
40.0000 meq | EXTENDED_RELEASE_TABLET | Freq: Once | ORAL | Status: AC
  Administered 2014-05-03: 40 meq via ORAL
  Filled 2014-05-03: qty 2

## 2014-05-03 MED ORDER — ONDANSETRON HCL 4 MG PO TABS: 4.0000 mg | ORAL_TABLET | Freq: Four times a day (QID) | ORAL | Status: DC | PRN

## 2014-05-03 NOTE — Discharge Summary (Signed)
Physician Discharge Summary  [name] [name] NTI:[id number] DOB: 1960/12/10 DOA: 04/29/2014  PCP: Orpah Melter, MD  Admit date: 04/29/2014 Discharge date: 05/03/2014  Recommendations for Outpatient Follow-up:  1. Check CBC and BMP during next visit with PCP 2. Pt instructed to make an appt with PCP in next 1 week to make sure her symptoms are controlled.  Discharge Diagnoses:  Principal Problem:   Nausea vomiting and diarrhea Active Problems:   Hypothyroid   Metastatic melanoma to lung   Hyperglycemia    Discharge Condition: stable   Diet recommendation: as tolerated   History of present illness:  53 y.o. female with past medical history of metastatic malignant melanoma, hypothyroidism, previous history of colitis who presented to [facility] ED 04/29/2014 with intractable nausea, vomiting, abdominal pain and diarrhea. CT abdomen on admission showed no acute intra-abdominal findings but there was a new 15 mm right adrenal nodule concerning for probable metastatic melanoma.  Patient was started on Cipro and Flagyl for treatment of possible colitis. I am not convinced that she has colitis but pt is adamant that this is how her symptoms were when she had colitis. She is very anxious. She is asking for having ativan on discharge. Her sister at the bedside is inquiring for pt to have ativan on discharge. She is also requesting endocrinology evaluation for adrenal met but have explained to her endo does not come to hospital rather we can provide outpt referral. She was also inquiring how pt can come to hospital without going through ED.   Assessment/Plan:   Principal Problem:  Nausea, vomiting and diarrhea  Patient feels better this morning.She tolerated regular diet this am without nausea or vomiting.  On discharge she is getting Zofran and phenergan alternating for nausea or vomiting. Cipro and Flagyl for treatment of possible colitis - she received 5 days of abx. No evidence of colitis so 5 days  of tx with those abx is good enough. Active Problems:  Hypothyroid  Continue Synthroid Metastatic melanoma to lung  Patient reports she will go to [facility] for research study. She has been followed by Dr. Earlie Server in [place]. Hypokalemia  Likely due to GI losses  Will get 5 days of potassium 20 meq on discharge. Her PCP will recheck potassium when she sees him in next week.   DVT prophylaxis: Lovenox subQ   Code Status: full code  Family Communication: plan of care discussed with the patient and her husband at the bedside; updated and met with the pt sister as well.   Consultants:  None  Procedures:  None  Antibiotics:  Cipro 04/30/2014 --> 05/03/2014 Flagyl 04/30/2014 --> 05/03/2014   Signed:  Robbie Lis, MD  [facility] 05/03/2014, 10:02 AM  Pager #: [phone]    Discharge Exam: Filed Vitals:   05/03/14 0518  BP: 90/55  Pulse: 83  Temp: 98.1 F (36.7 C)  Resp: 16   Filed Vitals:   05/02/14 1500 05/02/14 1530 05/02/14 2216 05/03/14 0518  BP: 77/53 96/60 89/44  90/55  Pulse: 105  90 83  Temp: 97.7 F (36.5 C)  98 F (36.7 C) 98.1 F (36.7 C)  TempSrc: Oral  Oral Oral  Resp: 18  14 16   Height:      Weight:      SpO2: 100%  100% 99%    General: Pt is alert, follows commands appropriately, not in acute distress Cardiovascular: Regular rate and rhythm, S1/S2 +, no murmurs Respiratory: Clear to auscultation bilaterally, no wheezing, no crackles, no  rhonchi Abdominal: Soft, non tender, non distended, bowel sounds +, no guarding Extremities: no edema, no cyanosis, pulses palpable bilaterally DP and PT Neuro: Grossly nonfocal  Discharge Instructions  Discharge Instructions   Call MD for:  difficulty breathing, headache or visual disturbances    Complete by:  As directed      Call MD for:  persistant dizziness or light-headedness    Complete by:  As directed      Call MD for:  persistant nausea and vomiting    Complete by:  As directed      Call  MD for:  severe uncontrolled pain    Complete by:  As directed      Diet - low sodium heart healthy    Complete by:  As directed      Discharge instructions    Complete by:  As directed   1. Please note to take potassium supplementation 20 mEq daily for 5 days. Followup with primary care physician and have your potassium level rechecked.     Increase activity slowly    Complete by:  As directed             Medication List         levothyroxine 125 MCG tablet  Commonly known as:  SYNTHROID, LEVOTHROID  Take 125 mcg by mouth daily before breakfast.     LORazepam 1 MG tablet  Commonly known as:  ATIVAN  Take 1 tablet (1 mg total) by mouth every 12 (twelve) hours as needed for anxiety or sleep.     methocarbamol 500 MG tablet  Commonly known as:  ROBAXIN  Take 500 mg by mouth as needed for muscle spasms.     omeprazole 10 MG capsule  Commonly known as:  PRILOSEC  Take 10 mg by mouth daily.     ondansetron 4 MG tablet  Commonly known as:  ZOFRAN  Take 1 tablet (4 mg total) by mouth every 6 (six) hours as needed for nausea.     piroxicam 20 MG capsule  Commonly known as:  FELDENE  Take 20 mg by mouth daily.     Potassium Chloride ER 20 MEQ Tbcr  Take 10 mEq by mouth daily.     promethazine 25 MG suppository  Commonly known as:  PHENERGAN  Place 1 suppository (25 mg total) rectally every 6 (six) hours as needed for nausea, vomiting or refractory nausea / vomiting.     traMADol 50 MG tablet  Commonly known as:  ULTRAM  Take 1 tablet (50 mg total) by mouth every 12 (twelve) hours as needed for moderate pain.           Follow-up Information   Follow up with Orpah Melter, MD. Schedule an appointment as soon as possible for a visit in 1 week. (Recheck potassium level)    Specialty:  Family Medicine   Contact information:   [address] [place] [address] [phone]        The results of significant diagnostics from this hospitalization  (including imaging, microbiology, ancillary and laboratory) are listed below for reference.    Significant Diagnostic Studies: Mr Kizzie Fantasia Contrast  2014-05-03   CLINICAL DATA:  Status post resection of a right cerebellar metastasis followed by [facility]. Suspected melanoma.  EXAM: MRI HEAD WITHOUT AND WITH CONTRAST  TECHNIQUE: Multiplanar, multiecho pulse sequences of the brain and surrounding structures were obtained without and with intravenous contrast.  CONTRAST:  7m MULTIHANCE GADOBENATE DIMEGLUMINE 529  MG/ML IV SOLN  COMPARISON:  CT head 03/18/2014.  MR head 11/14/2013.  FINDINGS: The patient underwent suboccipital craniectomy with removal of a right cerebellar metastasis 11/14/2013. Surgical encephalomalacia at the site of removal. Prominent dural enhancement is an expected postoperative finding.  There is minor inferior and medial enhancement of the cavity with a prominent draining vein. This is the first Brain MR since surgery. The findings may simply be related to postoperative scarring, but continued surveillance is warranted to ensure interval stability. Certainly no infiltrative component of enhancement as was present on the preoperative scan. No new lesions are evident. No osseous findings.  Normal cerebral volume. No significant white matter disease. No evidence for post treatment effect. Unremarkable paranasal sinuses. Mild mastoid fluid.  IMPRESSION: Minor inferior medial enhancement of the surgical cavity status post resection of a right cerebellar metastasis. Prominent dorsal draining vein. No convincing evidence for recurrent tumor, but continued surveillance is warranted.   Electronically Signed   By: Rolla Flatten M.D.   On: 04/04/2014 16:50   Ct Abdomen Pelvis W Contrast  04/30/2014   CLINICAL DATA:  Vomiting.  History of colitis.  History of melanoma.  EXAM: CT ABDOMEN AND PELVIS WITH CONTRAST  TECHNIQUE: Multidetector CT imaging of the abdomen and pelvis was performed using the standard  protocol following bolus administration of intravenous contrast.  CONTRAST:  151m OMNIPAQUE IOHEXOL 300 MG/ML  SOLN  COMPARISON:  03/17/2014  FINDINGS: BODY WALL: Unremarkable.  LOWER CHEST: 5 mm subpleural nodule in the right lower lobe is stable from previous. There is chronic scarring in the medial segment right middle lobe and in the posterior left lower lobe.  ABDOMEN/PELVIS:  Liver: Subcapsular low-density in the lower right lobe liver is stable from enhanced CT 01/25/2014 and likely a benign incidental cyst.  Biliary: No evidence of biliary obstruction or stone.  Pancreas: Unremarkable.  Spleen: Unremarkable.  Adrenals: New right adrenal nodule, 15 mm in diameter.  Kidneys and ureters: No hydronephrosis or nephrolithiasis. Ill-defined hypoenhancement in the upper and interpolar left kidney are nonspecific due to small size.  Bladder: Unremarkable.  Reproductive: Unremarkable.  Bowel: No obstruction. No evidence of enterocolitis. No pericecal inflammation.  Retroperitoneum: No mass or adenopathy.  Peritoneum: No free fluid or gas.  Vascular: No acute abnormality.  OSSEOUS: No acute abnormalities.  IMPRESSION: 1. No acute intra-abdominal findings. 2. New 15 mm right adrenal nodule, a probable melanoma metastasis.   Electronically Signed   By: JJorje GuildM.D.   On: 04/30/2014 00:15    Microbiology: Recent Results (from the past 240 hour(s))  CULTURE, BLOOD (ROUTINE X 2)     Status: None   Collection Time    04/30/14  1:40 AM      Result Value Ref Range Status   Specimen Description BLOOD RIGHT HAND   Final   Special Requests BOTTLES DRAWN AEROBIC ONLY 6CC   Final   Culture  Setup Time     Final   Value: 04/30/2014 03:58     Performed at [facility]  Culture     Final   Value:        BLOOD CULTURE RECEIVED NO GROWTH TO DATE CULTURE WILL BE HELD FOR 5 DAYS BEFORE ISSUING A FINAL NEGATIVE REPORT     Performed at [facility]  Report Status PENDING   Incomplete  CULTURE,  BLOOD (ROUTINE X 2)     Status: None   Collection Time    04/30/14  1:47 AM  Result Value Ref Range Status   Specimen Description BLOOD LEFT HAND   Final   Special Requests BOTTLES DRAWN AEROBIC ONLY 5CC   Final   Culture  Setup Time     Final   Value: 04/30/2014 03:58     Performed at [facility]   Culture     Final   Value:        BLOOD CULTURE RECEIVED NO GROWTH TO DATE CULTURE WILL BE HELD FOR 5 DAYS BEFORE ISSUING A FINAL NEGATIVE REPORT     Performed at [facility]   Report Status PENDING   Incomplete     Labs: Basic Metabolic Panel:  Recent Labs Lab 04/29/14 1955 04/30/14 0140 04/30/14 0343 04/30/14 1000 05/02/14 0341 05/02/14 1326 05/03/14 0447  NA 141 139 138  --  138  --  138  K 4.4 2.9* 3.0* 3.1* 2.7* 2.8* 3.3*  CL 99 100 100  --  98  --  104  CO2 18* 19 18*  --  24  --  22  GLUCOSE 210* 149* 185*  --  128*  --  85  BUN 11 10 10   --  7  --  13  CREATININE 0.52 0.46* 0.48*  --  0.50  --  0.56  CALCIUM 10.9* 10.2 9.8  --  9.4  --  8.4   Liver Function Tests:  Recent Labs Lab 04/29/14 1955  AST 27  ALT 19  ALKPHOS 120*  BILITOT 0.8  PROT 8.2  ALBUMIN 4.6    Recent Labs Lab 04/29/14 1955  LIPASE 7*   No results found for this basename: AMMONIA,  in the last 168 hours CBC:  Recent Labs Lab 04/29/14 1955 04/30/14 0343  WBC 10.5 9.7  NEUTROABS 9.2*  --   HGB 16.1* 14.2  HCT 46.9* 41.3  MCV 91.1 89.4  PLT 367 304   Cardiac Enzymes: No results found for this basename: CKTOTAL, CKMB, CKMBINDEX, TROPONINI,  in the last 168 hours BNP: BNP (last 3 results) No results found for this basename: PROBNP,  in the last 8760 hours CBG: No results found for this basename: GLUCAP,  in the last 168 hours  Time coordinating discharge: Over 30 minutes

## 2014-05-03 NOTE — Discharge Instructions (Signed)
Viral Gastroenteritis Viral gastroenteritis is also known as stomach flu. This condition affects the stomach and intestinal tract. It can cause sudden diarrhea and vomiting. The illness typically lasts 3 to 8 days. Most people develop an immune response that eventually gets rid of the virus. While this natural response develops, the virus can make you quite ill. CAUSES  Many different viruses can cause gastroenteritis, such as rotavirus or noroviruses. You can catch one of these viruses by consuming contaminated food or water. You may also catch a virus by sharing utensils or other personal items with an infected person or by touching a contaminated surface. SYMPTOMS  The most common symptoms are diarrhea and vomiting. These problems can cause a severe loss of body fluids (dehydration) and a body salt (electrolyte) imbalance. Other symptoms may include:  Fever.  Headache.  Fatigue.  Abdominal pain. DIAGNOSIS  Your caregiver can usually diagnose viral gastroenteritis based on your symptoms and a physical exam. A stool sample may also be taken to test for the presence of viruses or other infections. TREATMENT  This illness typically goes away on its own. Treatments are aimed at rehydration. The most serious cases of viral gastroenteritis involve vomiting so severely that you are not able to keep fluids down. In these cases, fluids must be given through an intravenous line (IV). HOME CARE INSTRUCTIONS   Drink enough fluids to keep your urine clear or pale yellow. Drink small amounts of fluids frequently and increase the amounts as tolerated.  Ask your caregiver for specific rehydration instructions.  Avoid:  Foods high in sugar.  Alcohol.  Carbonated drinks.  Tobacco.  Juice.  Caffeine drinks.  Extremely hot or cold fluids.  Fatty, greasy foods.  Too much intake of anything at one time.  Dairy products until 24 to 48 hours after diarrhea stops.  You may consume probiotics.  Probiotics are active cultures of beneficial bacteria. They may lessen the amount and number of diarrheal stools in adults. Probiotics can be found in yogurt with active cultures and in supplements.  Wash your hands well to avoid spreading the virus.  Only take over-the-counter or prescription medicines for pain, discomfort, or fever as directed by your caregiver. Do not give aspirin to children. Antidiarrheal medicines are not recommended.  Ask your caregiver if you should continue to take your regular prescribed and over-the-counter medicines.  Keep all follow-up appointments as directed by your caregiver. SEEK IMMEDIATE MEDICAL CARE IF:   You are unable to keep fluids down.  You do not urinate at least once every 6 to 8 hours.  You develop shortness of breath.  You notice blood in your stool or vomit. This may look like coffee grounds.  You have abdominal pain that increases or is concentrated in one small area (localized).  You have persistent vomiting or diarrhea.  You have a fever.  The patient is a child younger than 3 months, and he or she has a fever.  The patient is a child older than 3 months, and he or she has a fever and persistent symptoms.  The patient is a child older than 3 months, and he or she has a fever and symptoms suddenly get worse.  The patient is a baby, and he or she has no tears when crying. MAKE SURE YOU:   Understand these instructions.  Will watch your condition.  Will get help right away if you are not doing well or get worse. Document Released: 11/14/2005 Document Revised: 02/06/2012 Document Reviewed: 08/31/2011  ExitCare Patient Information 2014 [place]. Colitis Colitis is inflammation of the colon. Colitis can be a short-term or long-standing (chronic) illness. Crohn's disease and ulcerative colitis are 2 types of colitis which are chronic. They usually require lifelong treatment. CAUSES  There are many different causes of  colitis, including:  Viruses.  Germs (bacteria).  Medicine reactions. SYMPTOMS   Diarrhea.  Intestinal bleeding.  Pain.  Fever.  Throwing up (vomiting).  Tiredness (fatigue).  Weight loss.  Bowel blockage. DIAGNOSIS  The diagnosis of colitis is based on examination and stool or blood tests. X-rays, CT scan, and colonoscopy may also be needed. TREATMENT  Treatment may include:  Fluids given through the vein (intravenously).  Bowel rest (nothing to eat or drink for a period of time).  Medicine for pain and diarrhea.  Medicines (antibiotics) that kill germs.  Cortisone medicines.  Surgery. HOME CARE INSTRUCTIONS   Get plenty of rest.  Drink enough water and fluids to keep your urine clear or pale yellow.  Eat a well-balanced diet.  Call your caregiver for follow-up as recommended. SEEK IMMEDIATE MEDICAL CARE IF:   You develop chills.  You have an oral temperature above 102 F (38.9 C), not controlled by medicine.  You have extreme weakness, fainting, or dehydration.  You have repeated vomiting.  You develop severe belly (abdominal) pain or are passing bloody or tarry stools. MAKE SURE YOU:   Understand these instructions.  Will watch your condition.  Will get help right away if you are not doing well or get worse. Document Released: 12/22/2004 Document Revised: 02/06/2012 Document Reviewed: 03/19/2010 [facility] Patient Information 2014 [place], [place].

## 2014-05-03 NOTE — Progress Notes (Signed)
ANTIBIOTIC CONSULT NOTE - FOLLOW UP  Pharmacy Consult for Cipro Indication: Possible colitis  Allergies  Allergen Reactions  . Morphine And Related Nausea Only    Patient Measurements: Height: 5\' 7"  (170.2 cm) Weight: 117 lb 8 oz (53.298 kg) IBW/kg (Calculated) : 61.6 Adjusted Body Weight:   Vital Signs: Temp: 98.1 F (36.7 C) (06/06 0518) Temp src: Oral (06/06 0518) BP: 90/55 mmHg (06/06 0518) Pulse Rate: 83 (06/06 0518) Intake/Output from previous day: 06/05 0701 - 06/06 0700 In: 1547.5 [P.O.:720; I.V.:527.5; IV Piggyback:300] Out: -  Intake/Output from this shift:    Labs:  Recent Labs  05/02/14 0341 05/03/14 0447  CREATININE 0.50 0.56   Estimated Creatinine Clearance: 68.4 ml/min (by C-G formula based on Cr of 0.56). No results found for this basename: VANCOTROUGH, Corlis Leak, VANCORANDOM, GENTTROUGH, GENTPEAK, GENTRANDOM, TOBRATROUGH, TOBRAPEAK, TOBRARND, AMIKACINPEAK, AMIKACINTROU, AMIKACIN,  in the last 72 hours   Microbiology: Recent Results (from the past 720 hour(s))  CULTURE, BLOOD (ROUTINE X 2)     Status: None   Collection Time    04/30/14  1:40 AM      Result Value Ref Range Status   Specimen Description BLOOD RIGHT HAND   Final   Special Requests BOTTLES DRAWN AEROBIC ONLY 6CC   Final   Culture  Setup Time     Final   Value: 04/30/2014 03:58     Performed at [facility]   Culture     Final   Value:        BLOOD CULTURE RECEIVED NO GROWTH TO DATE CULTURE WILL BE HELD FOR 5 DAYS BEFORE ISSUING A FINAL NEGATIVE REPORT     Performed at [facility]   Report Status PENDING   Incomplete  CULTURE, BLOOD (ROUTINE X 2)     Status: None   Collection Time    04/30/14  1:47 AM      Result Value Ref Range Status   Specimen Description BLOOD LEFT HAND   Final   Special Requests BOTTLES DRAWN AEROBIC ONLY 5CC   Final   Culture  Setup Time     Final   Value: 04/30/2014 03:58     Performed at [facility]   Culture     Final   Value:        BLOOD CULTURE RECEIVED NO GROWTH TO DATE CULTURE WILL BE HELD FOR 5 DAYS BEFORE ISSUING A FINAL NEGATIVE REPORT     Performed at [facility]   Report Status PENDING   Incomplete    Anti-infectives   Start     Dose/Rate Route Frequency Ordered Stop   04/30/14 0145  metroNIDAZOLE (FLAGYL) IVPB 500 mg     500 mg 100 mL/hr over 60 Minutes Intravenous 3 times per day 04/30/14 0138     04/30/14 0145  ciprofloxacin (CIPRO) IVPB 400 mg     400 mg 200 mL/hr over 60 Minutes Intravenous 2 times daily 04/30/14 0143        Assessment: 74 yoF with PMHx metastatic malignant melanoma, hypothyroidism, and previous history of colitis presents to [facility] with intractable N/V/D, abd pain.  Pt started on cipro and flagyl for possible colitis.  CT abdomen shows new right adrenal nodule concerning for metastatic melanoma.   04/30/2014 >> Cipro >> 04/30/2014 >> Flagyl >> (MD)  Tmax: AF WBCs: WNL (last checked 6/3) Renal: WNL, CrCl 68 ml/min, lower weight 53 kg  6/3 Blood x 2: NGTD  Goal of Therapy:  Eradication of infection  Plan:  Continue Cipro at current dose.    Ralene Bathe, PharmD, BCPS 05/03/2014, 11:02 AM  Pager: [phone]

## 2014-05-05 ENCOUNTER — Telehealth: Payer: Self-pay | Admitting: *Deleted

## 2014-05-05 NOTE — Telephone Encounter (Signed)
Pt called wanting to know who was responsible for her care.  She is going to be a part of a trial at [facility] called the "1-2-3 study".  Per dr Vista Mink, while she is on the trial they will be responsible for her care but she can follow up with Dr Vista Mink after that.  She states "no one wants me".  She was recently in the ED for n/v and was dx with gastroenteritis.  Her GI doctor feels it is r/t her cancer.  Per Dr Vista Mink, this is not related to her cancer.  Pt verbalized understanding.  Asked her to call back if she has any other questions of concerns.  SLJ

## 2014-05-05 NOTE — Progress Notes (Signed)
Discharge summary sent to payer through [facility]  

## 2014-05-06 LAB — CULTURE, BLOOD (ROUTINE X 2)
Culture: NO GROWTH
Culture: NO GROWTH

## 2014-05-07 ENCOUNTER — Telehealth: Payer: Self-pay | Admitting: *Deleted

## 2014-05-07 NOTE — Telephone Encounter (Signed)
Pt wants to know if she can get long term disability.  Sent message to Carmelina Noun to contact patient.  SLJ

## 2014-06-04 ENCOUNTER — Other Ambulatory Visit: Payer: Self-pay | Admitting: Radiation Therapy

## 2014-06-04 DIAGNOSIS — C7931 Secondary malignant neoplasm of brain: Secondary | ICD-10-CM

## 2014-06-04 DIAGNOSIS — C7949 Secondary malignant neoplasm of other parts of nervous system: Principal | ICD-10-CM

## 2014-06-07 ENCOUNTER — Other Ambulatory Visit: Payer: Self-pay | Admitting: Internal Medicine

## 2014-06-16 ENCOUNTER — Encounter (HOSPITAL_COMMUNITY): Payer: Self-pay

## 2014-06-18 ENCOUNTER — Other Ambulatory Visit: Payer: Self-pay | Admitting: Internal Medicine

## 2014-07-04 ENCOUNTER — Ambulatory Visit (HOSPITAL_COMMUNITY): Payer: 59

## 2014-07-04 ENCOUNTER — Other Ambulatory Visit: Payer: 59

## 2014-07-04 ENCOUNTER — Telehealth: Payer: Self-pay | Admitting: Medical Oncology

## 2014-07-04 NOTE — Telephone Encounter (Signed)
CT cancelled by pt.

## 2014-07-10 ENCOUNTER — Ambulatory Visit: Payer: 59 | Admitting: Internal Medicine

## 2014-07-11 ENCOUNTER — Other Ambulatory Visit: Payer: 59

## 2014-07-14 ENCOUNTER — Ambulatory Visit: Payer: 59 | Admitting: Radiation Oncology

## 2014-07-30 DIAGNOSIS — R9431 Abnormal electrocardiogram [ECG] [EKG]: Secondary | ICD-10-CM | POA: Insufficient documentation

## 2014-07-30 HISTORY — DX: Abnormal electrocardiogram (ECG) (EKG): R94.31

## 2014-08-27 ENCOUNTER — Ambulatory Visit: Payer: 59 | Admitting: Radiation Oncology

## 2014-09-07 ENCOUNTER — Encounter: Payer: Self-pay | Admitting: Radiation Oncology

## 2014-09-07 NOTE — Progress Notes (Signed)
Radiation Oncology         [phone]    Name: [name] [name]   Date: 09/08/2014   MRN: [id number]  DOB: Oct 01, 1961    Multidisciplinary Brain and Spine Oncology Clinic Follow-Up Visit Note  CC: Orpah Melter, MD  Orpah Melter, MD  Diagnosis:   53 year old woman status post resection of a 17 mm right cerebellar BRAF positive brain tumor followed by post-op conformal irradiation to 40 Gy in 10 fractions through 01/16/14  Interval Since Last Radiation:  8  months  Narrative:  The patient returns today for routine follow-up.  She saw med-onc at [facility] on 9/30 and had brain MRI on 08/29/14. The recent films were presented in our multidisciplinary conference with neuroradiology just prior to the clinic.  Discharged from [place] 10/5. States, "I feel great today." Reports intermittent migraine headaches behind her right eye. Reports nausea is managed with phenergan. Reports she had part of her left lung resected in April. Understands her primary diagnosis is melanoma. Taking immunotherapy at [facility]. Scheduled for follow up scan at [place] on 09/11/14                              ALLERGIES:  is allergic to morphine and related and morphine.  Meds: Current Outpatient Prescriptions  Medication Sig Dispense Refill  . citalopram (CELEXA) 20 MG tablet Take by mouth.      . levothyroxine (SYNTHROID, LEVOTHROID) 50 MCG tablet Take by mouth.      Marland Kitchen LORazepam (ATIVAN) 1 MG tablet Take 1 tablet (1 mg total) by mouth every 12 (twelve) hours as needed for anxiety or sleep.  60 tablet  0  . metroNIDAZOLE (FLAGYL) 500 MG tablet Take by mouth.      . oxyCODONE (OXY IR/ROXICODONE) 5 MG immediate release tablet Take by mouth.      . piroxicam (FELDENE) 20 MG capsule Take 20 mg by mouth daily.      . promethazine (PHENERGAN) 25 MG suppository Place 1 suppository (25 mg total) rectally every 6 (six) hours as needed for nausea, vomiting or refractory nausea / vomiting.  45 each  0  . traMADol (ULTRAM) 50 MG  tablet Take 1 tablet (50 mg total) by mouth every 12 (twelve) hours as needed for moderate pain.  50 tablet  0  . methocarbamol (ROBAXIN) 500 MG tablet Take 500 mg by mouth as needed for muscle spasms.      Marland Kitchen omeprazole (PRILOSEC) 10 MG capsule Take 10 mg by mouth daily.      . ondansetron (ZOFRAN) 4 MG tablet Take 1 tablet (4 mg total) by mouth every 6 (six) hours as needed for nausea.  20 tablet  0  . potassium chloride 20 MEQ TBCR Take 10 mEq by mouth daily.  5 tablet  0   No current facility-administered medications for this encounter.    Physical Findings: The patient is in no acute distress. Patient is alert and oriented.  weight is 116 lb 6.4 oz (52.799 kg). Her blood pressure is 99/61 and her pulse is 72. Her respiration is 18. .  No significant changes.  Lab Findings: Lab Results  Component Value Date   WBC 9.7 04/30/2014   HGB 14.2 04/30/2014   HCT 41.3 04/30/2014   MCV 89.4 04/30/2014   PLT 304 04/30/2014    _0 @  Radiographic Findings:  AT [facility]  ** MRI BRAIN WITHOUT AND WITH CONTRAST **  INDICATION: C43.9 Malignant  melanoma of skin, unspecified, newly elevated ACTH and cortisol, has metastatic melanoma provided. History of metastatic melanoma s/p craniotomy in Dec 2014 and SRS.  COMPARISON: Brain MRI from 05/19/2014  TECHNIQUE/PROTOCOL: Standard adult brain protocol.  CONTRAST: 10.41m MultiHance IV. This MRI was performed before and after IV administration of contrast material. IV contrast was administered to improve disease detection and further define anatomy. *    GFR:  >60 mL/min/1.73 square meters. *    Complications:  No immediate patient complications or events noted.  FINDINGS:  Her postsurgical changes status post prior suboccipital craniotomy with resection cavity in the right cerebellar hemisphere. Stable linear enhancing structure within the resection cavity possibly representing a vessel. The previously described 3 mm nodular enhancing focus  superior aspect appears stable (series 13 image 74). T2 hyperintensity surrounding the resection cavity is slightly increased, possibly reflecting evolving posttreatment change. There is a new small 319mpunctate enhancing focus inferior to the resection cavity (series 13 image 72). There are new patchy areas of T2 hyperintensity within the subcortical white matter of the bilateral occipital lobes and posterior parietal lobes, possibly reflecting postradiation change.  There is no acute cortical infarct or intracranial hemorrhage. The extra-axial spaces, basal cisterns, ventricles and sulci are normal. Intracranial flow-voids appear normal. The orbits, mastoid sinuses, and visualized paranasal sinuses are unremarkable.   IMPRESSION: 1. New 68m40module just inferior to the resection cavity in the right cerebellar hemisphere, which is nonspecific and could reflect posttreatment change. Attention on follow up.  2. Patchy new areas of T2 hyperintensity within the subcortical white matter of the bilateral occipital lobes and posterior parietal lobes, possibly reflecting postradiation change.  Electronically Reviewed by:  KatMikael SprayD Electronically Reviewed on:  08/29/2014 1:12 PM  I have reviewed the images and concur with the above findings.  Electronically Signed by:  JenDedra SkeensD Electronically Signed on:  08/29/2014 5:01 PM   Impression:  The patient has no recurrence in brain or new metastases  Plan:  Follow-up after MRI.  _____________________________________  MatSheral ApleynTammi Klippel.D.

## 2014-09-08 ENCOUNTER — Encounter: Payer: Self-pay | Admitting: Radiation Oncology

## 2014-09-08 ENCOUNTER — Ambulatory Visit
Admission: RE | Admit: 2014-09-08 | Discharge: 2014-09-08 | Disposition: A | Discharge status: Home / Self Care | Payer: 59 | Source: Ambulatory Visit | Attending: Radiation Oncology | Admitting: Radiation Oncology

## 2014-09-08 VITALS — BP 99/61 | HR 72 | Resp 18 | Wt 116.4 lb

## 2014-09-08 DIAGNOSIS — G9389 Other specified disorders of brain: Secondary | ICD-10-CM

## 2014-09-08 NOTE — Progress Notes (Signed)
Discharged from [place] 10/5. States, "I feel great today." Reports intermittent migraine headaches behind her right eye. Reports nausea is managed with phenergan. Reports she had part of her left lung resected in April. Understands her primary diagnosis is melanoma. Taking immunotherapy at [facility]. Scheduled for follow up scan at [facility] on 09/11/14.

## 2014-11-24 ENCOUNTER — Ambulatory Visit
Admission: RE | Admit: 2014-11-24 | Discharge: 2014-11-24 | Disposition: A | Discharge status: Home / Self Care | Payer: 59 | Source: Ambulatory Visit | Attending: Radiation Oncology | Admitting: Radiation Oncology

## 2014-11-24 ENCOUNTER — Other Ambulatory Visit: Payer: Self-pay | Admitting: Radiation Oncology

## 2014-11-24 DIAGNOSIS — C719 Malignant neoplasm of brain, unspecified: Secondary | ICD-10-CM

## 2014-12-01 ENCOUNTER — Encounter: Payer: Self-pay | Admitting: Radiation Oncology

## 2014-12-01 ENCOUNTER — Ambulatory Visit
Admission: RE | Admit: 2014-12-01 | Discharge: 2014-12-01 | Disposition: A | Discharge status: Home / Self Care | Payer: 59 | Source: Ambulatory Visit | Attending: Radiation Oncology | Admitting: Radiation Oncology

## 2014-12-01 VITALS — BP 104/58 | HR 68 | Resp 16 | Wt 110.2 lb

## 2014-12-01 DIAGNOSIS — C7949 Secondary malignant neoplasm of other parts of nervous system: Secondary | ICD-10-CM

## 2014-12-01 DIAGNOSIS — C7931 Secondary malignant neoplasm of brain: Secondary | ICD-10-CM

## 2014-12-01 DIAGNOSIS — G8918 Other acute postprocedural pain: Secondary | ICD-10-CM

## 2014-12-01 MED ORDER — TRAMADOL HCL 50 MG PO TABS: 50.0000 mg | ORAL_TABLET | Freq: Two times a day (BID) | ORAL | Status: DC | PRN

## 2014-12-01 NOTE — Addendum Note (Signed)
Encounter addended by: Lora Paula, MD on: 12/01/2014  6:18 PM<BR>     Documentation filed: Follow-up Section, LOS Section

## 2014-12-01 NOTE — Progress Notes (Signed)
Patient has been to the emergency room and admitted several times over the last years with nausea and vomiting. All EGDs and colonscopy have returned unremarkable. Since 03/03/2014 patient has lost 16 pounds. Reports decreased appetite and that "food just doesn't taste good." Reports tenderness and numbness continue at surgical site/occipital area. Reports occipital headaches and behind both eyes. Reports ringing in the ears is no worse. Denies diplopia. Reports occasional floaters. Denies taking decadron. Denies seizure activity. Weight and vitals stable. Follow up at [facility] in March with next scan.

## 2014-12-01 NOTE — Progress Notes (Signed)
Radiation Oncology         [phone]    Name: [name] [name]   Date: 12/01/2014   MRN: [id number]  DOB: [birth year]    Multidisciplinary Brain and Spine Oncology Clinic Follow-Up Visit Note  CC: Orpah Melter, MD  Orpah Melter, MD    ICD-9-CM ICD-10-CM   1. Post-op pain 338.18 G89.18 traMADol (ULTRAM) 50 MG tablet  2. Solitary brain metastasis from melanoma 198.3 C79.31     Diagnosis:   54 year old woman status post resection of a 17 mm right cerebellar BRAF positive brain tumor followed by post-op conformal irradiation to 40 Gy in 10 fractions through 01/16/14  Interval Since Last Radiation:  11  months  Narrative:  The patient returns today for routine follow-up.  The recent films were presented in our multidisciplinary conference with neuroradiology just prior to the clinic.  Patient has been to the emergency room and admitted several times over the last years with nausea and vomiting. All EGDs and colonscopy have returned unremarkable. Since 03/03/2014 patient has lost 16 pounds. Reports decreased appetite and that "food just doesn't taste good." Reports tenderness and numbness continue at surgical site/occipital area. Reports occipital headaches and behind both eyes. Reports ringing in the ears is no worse. Denies diplopia. Reports occasional floaters. Denies taking decadron. Denies seizure activity. Weight and vitals stable. Follow up at [facility] in March with next scan.                              ALLERGIES:  is allergic to morphine and related; morphine; and scopolamine.  Meds: Current Outpatient Prescriptions  Medication Sig Dispense Refill  . bisacodyl (STIMULANT LAXATIVE) 5 MG EC tablet Take by mouth.    . citalopram (CELEXA) 20 MG tablet Take by mouth.    . levothyroxine (SYNTHROID, LEVOTHROID) 50 MCG tablet Take by mouth.    Marland Kitchen LORazepam (ATIVAN) 1 MG tablet Take 1 tablet (1 mg total) by mouth every 12 (twelve) hours as needed for anxiety or sleep. 60 tablet 0  .  omeprazole (PRILOSEC) 10 MG capsule Take 10 mg by mouth daily.    . promethazine (PHENERGAN) 25 MG tablet Take by mouth.    . traMADol (ULTRAM) 50 MG tablet Take 1 tablet (50 mg total) by mouth every 12 (twelve) hours as needed for moderate pain. 50 tablet 0  . methocarbamol (ROBAXIN) 500 MG tablet Take 500 mg by mouth as needed for muscle spasms.    . ondansetron (ZOFRAN) 4 MG tablet Take 1 tablet (4 mg total) by mouth every 6 (six) hours as needed for nausea. (Patient not taking: Reported on 12/01/2014) 20 tablet 0  . OxyCODONE (OXYCONTIN) 10 mg T12A 12 hr tablet Take by mouth.    . piroxicam (FELDENE) 20 MG capsule Take 20 mg by mouth daily.    . potassium chloride 20 MEQ TBCR Take 10 mEq by mouth daily. (Patient not taking: Reported on 12/01/2014) 5 tablet 0  . promethazine (PHENERGAN) 25 MG suppository Place 1 suppository (25 mg total) rectally every 6 (six) hours as needed for nausea, vomiting or refractory nausea / vomiting. (Patient not taking: Reported on 12/01/2014) 45 each 0   No current facility-administered medications for this encounter.    Physical Findings: The patient is in no acute distress. Patient is alert and oriented.  weight is 110 lb 3.2 oz (49.986 kg). Her blood pressure is 104/58 and her pulse is 68. Her respiration is  16. .  No significant changes.  Lab Findings: Lab Results  Component Value Date   WBC 9.7 04/30/2014   HGB 14.2 04/30/2014   HCT 41.3 04/30/2014   MCV 89.4 04/30/2014   PLT 304 04/30/2014    @LASTCHEM @  Radiographic Findings: AT [facility]   ** MRI BRAIN WITHOUT AND WITH CONTRAST **  INDICATION: C43.9 Malignant melanoma of skin, unspecified, melanoma, evaluate brain metastasis provided.    .  COMPARISON: 08/29/2014  TECHNIQUE/PROTOCOL: Standard adult brain protocol.  CONTRAST: 35m MultiHance IV. This MRI was performed before and after IV administration of contrast material. IV contrast was administered to improve disease detection and further  define anatomy. *    GFR: GFR lab draw before contrast administration was not performed due to the patient's age and normal renal history. *    Complications:  No immediate patient complications or events noted.  FINDINGS: Postsurgical changes from prior suboccipital craniotomy with a surgical cavity involving the right cerebellar hemisphere. There is linear enhancement seen within the resection cavity which is stable. Hyperintense T2/FLAIR signal also surrounds a resection cavity and also appears unchanged. The previously identified area of nodular enhancement below the resection cavity is no longer seen.  No acute cortical infarct. No acute intracranial hemorrhage. The ventricles and basal cisterns appear unchanged.  There is fluid within the maxillary sinuses, right greater than left which is new. The right anterior ethmoid sinus has mucosal thickening. The mastoid air cells appear unremarkable. The orbits appear normal. Spondylitic changes of the cervical spine.  IMPRESSION:  1. Previously identified area of nodular enhancement in the inferior right cerebellum is no longer identified.  2. Stable postoperative changes of the right cerebellum with no findings to suggest disease progression or local recurrence..  3. Fluid in the maxillary sinus, right greater than left, which is new. This can be seen in acute sinusitis in the appropriate clinical setting.    Electronically Reviewed by:  MAlexander Mt MD Electronically Reviewed on:  11/12/2014 3:25 PM  I have reviewed the images and concur with the above findings.  Electronically Signed by:  JWaunita Schooner MD Electronically Signed on:  11/12/2014 7:45 PM   Impression:  The patient has no active disease.  Plan:  Follow-up after next MRI in late March or early April.  _____________________________________  MSheral Apley MTammi Klippel M.D.

## 2014-12-19 IMAGING — CT CT ABD-PELV W/O CM
2 of 4 series · 16 of 46 positions shown, 18 images · non-contrast
Comparison: Study earlier today. It was unknown that the patient
had an earlier exam this day.

CLINICAL DATA: 53-year-old female with abdominal and pelvic pain,
vomiting and diarrhea.

EXAM:
CT ABDOMEN AND PELVIS WITHOUT CONTRAST
TECHNIQUE: Multidetector CT imaging of the abdomen and pelvis was performed
following the standard protocol without intravenous contrast.

[Series 2: abd/ pelvis 5.0 i30f 1 · axial · 0.62mm/px · z∈[+236,+626]mm · 13 of 86 slices shown, 15 images]
[im 4/86  soft-tissue]
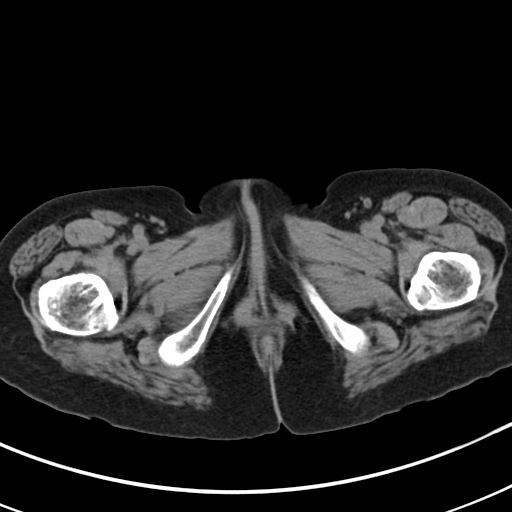
[im 4/86  bone]
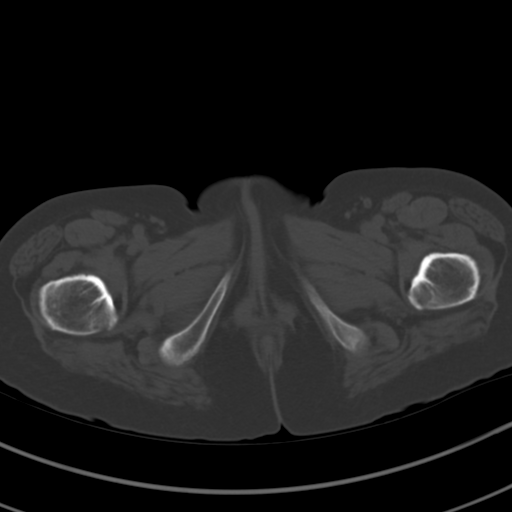
[im 10/86  soft-tissue]
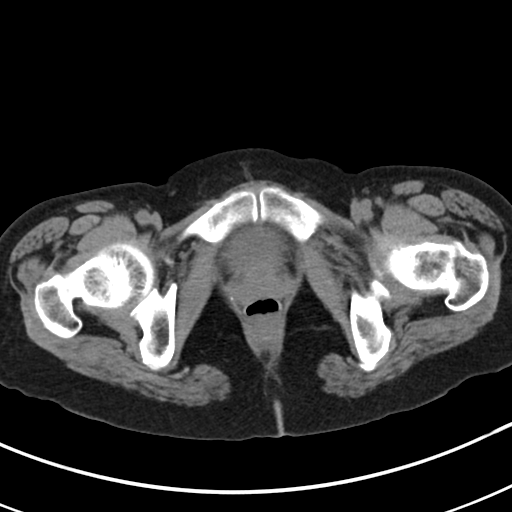
[im 17/86  soft-tissue]
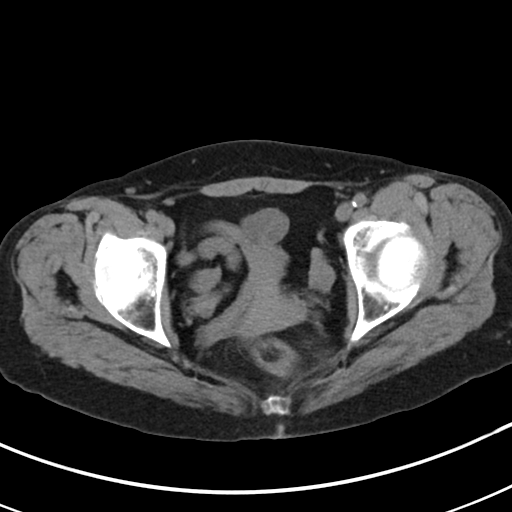
[im 23/86  soft-tissue]
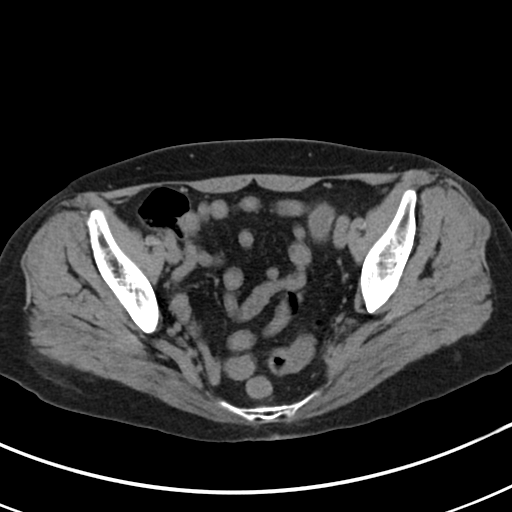
[im 30/86  soft-tissue]
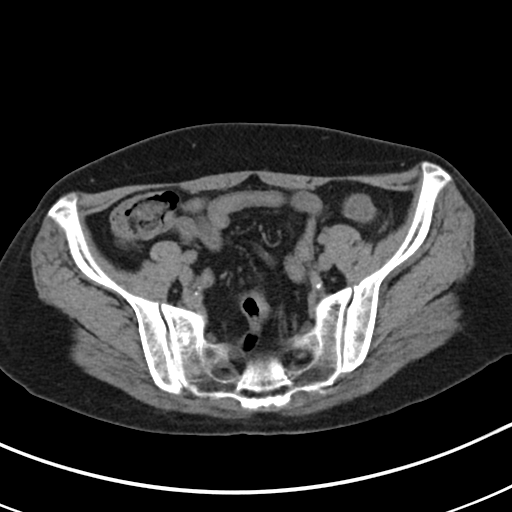
[im 36/86  soft-tissue]
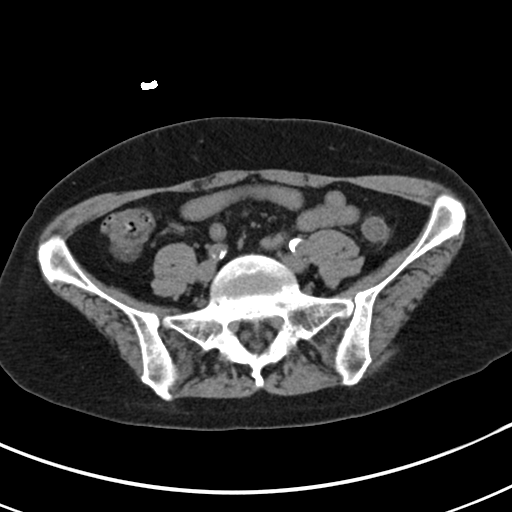
[im 43/86  soft-tissue]
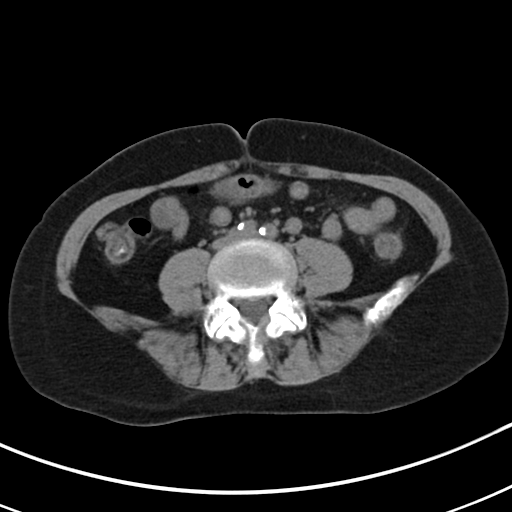
[im 50/86  soft-tissue]
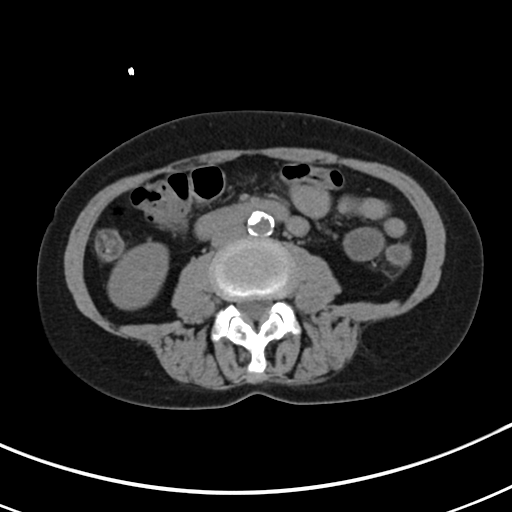
[im 56/86  soft-tissue]
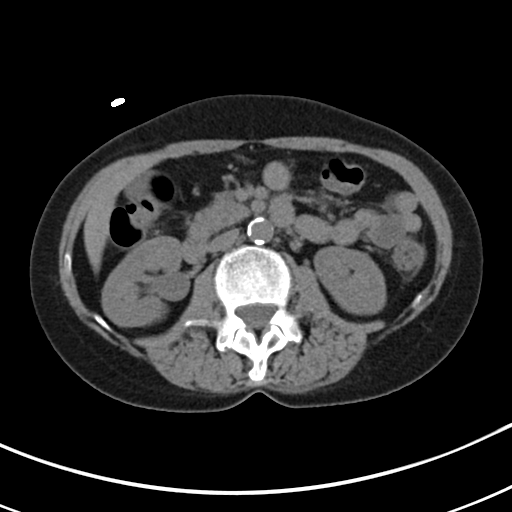
[im 56/86  bone]
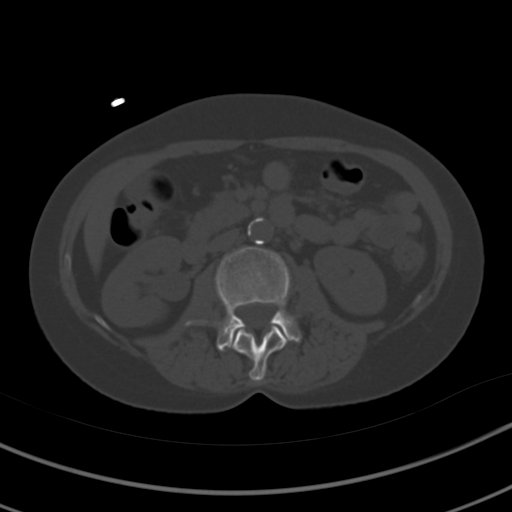
[im 63/86  soft-tissue]
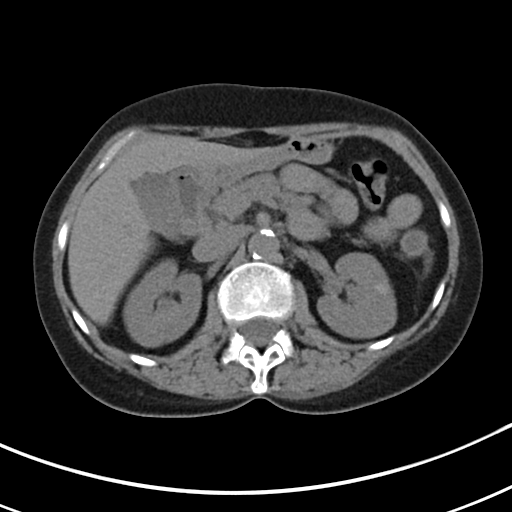
[im 69/86  soft-tissue]
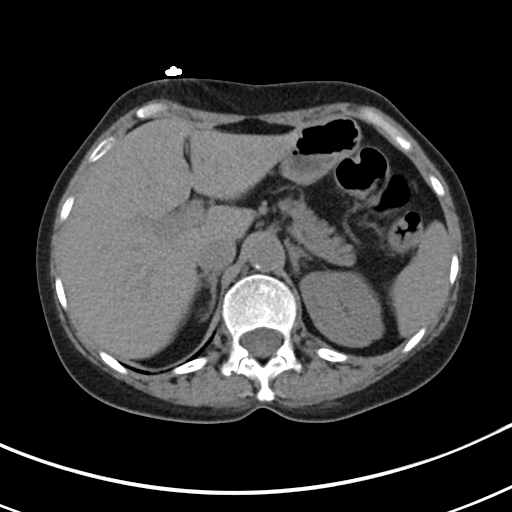
[im 76/86  soft-tissue]
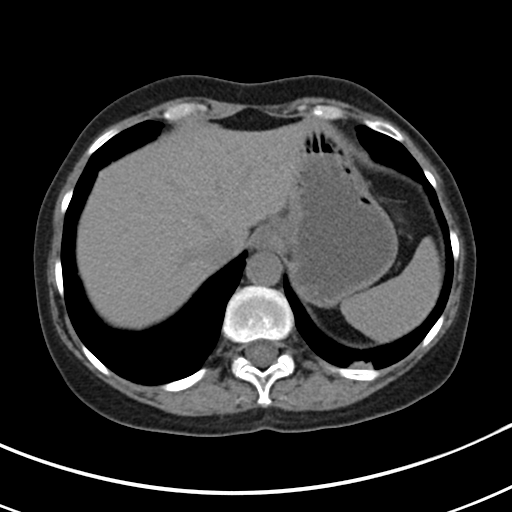
[im 82/86  soft-tissue]
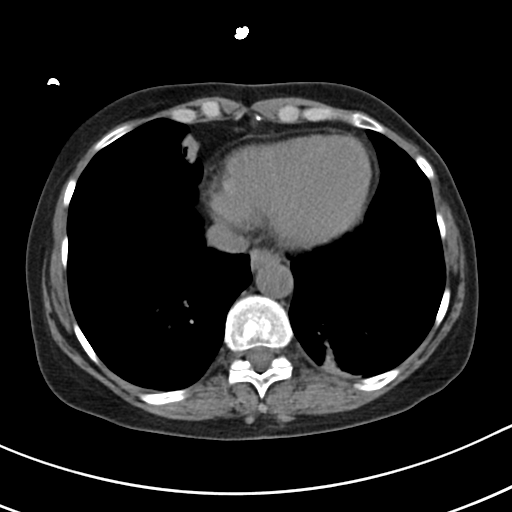

[Series 7: cor st · coronal · 0.67mm/px · 3 of 94 slices shown]
[im 32/94  soft-tissue]
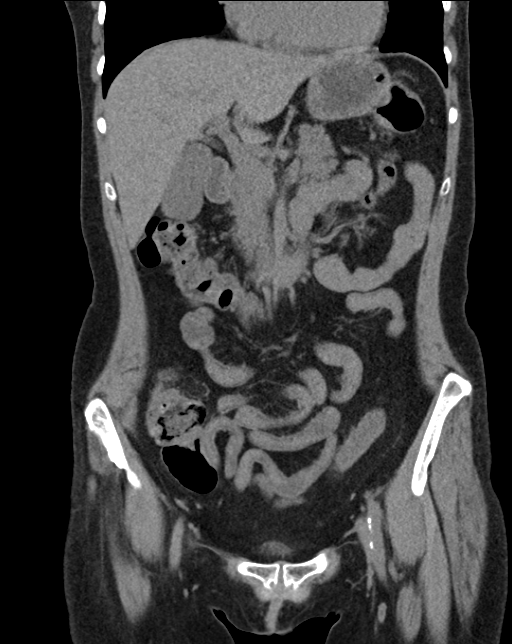
[im 42/94  soft-tissue]
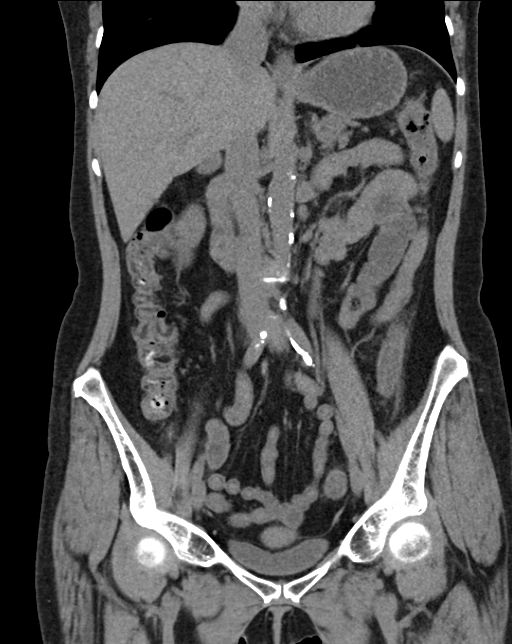
[im 52/94  soft-tissue]
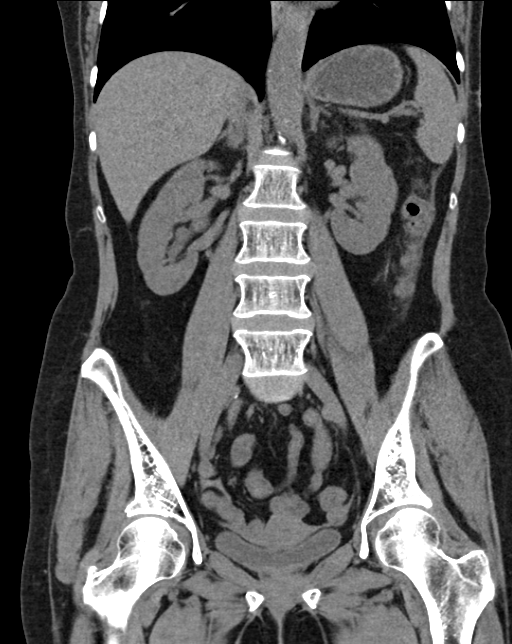

[16 of 46 positions shown; findings below may reference images not displayed]

FINDINGS: Mild circumferential wall thickening and adjacent inflammation of
the descending colon is noted compatible with a mild colitis.

There is no evidence of bowel obstruction, pneumoperitoneum or
abscess.

The liver, spleen, adrenal glands, pancreas and gallbladder are
unremarkable.

Mild right hydronephrosis is again identified but decreased since
01/25/2014.

A punctate nonobstructing mid left renal calculus is present.

Please note that parenchymal abnormalities may be missed without
intravenous contrast.

There is no evidence of free fluid, enlarged lymph nodes, biliary
dilation or abdominal aortic aneurysm.

The bladder is within normal limits.

No acute or suspicious bony abnormalities are identified.
IMPRESSION: Mild colitis of the descending colon - likely inflammatory or
infectious. No evidence of bowel obstruction, pneumoperitoneum or
abscess.

Mild right hydronephrosis again identified, but decreased from
01/25/2014.

Punctate nonobstructing left renal calculus.

## 2014-12-20 IMAGING — CT CT HEAD W/O CM
2 series · 16 of 30 positions shown, 18 images · non-contrast
Comparison: NM PET IMAGE INITIAL (PI) SKULL BASE TO THIGH dated
01/02/2014; CT HEAD W/O CM dated 12/04/2013

CLINICAL DATA: Persistent nausea, history of brain surgery
(cerebellar mass).

EXAM:
CT HEAD WITHOUT CONTRAST
TECHNIQUE: Contiguous axial images were obtained from the base of the skull
through the vertex without intravenous contrast.

[Series 201: head w/o, idose (1) · axial · non-contrast · 0.49mm/px · z∈[+889,+999]mm · 8 of 30 slices shown, 10 images]
[im 4/30  brain]
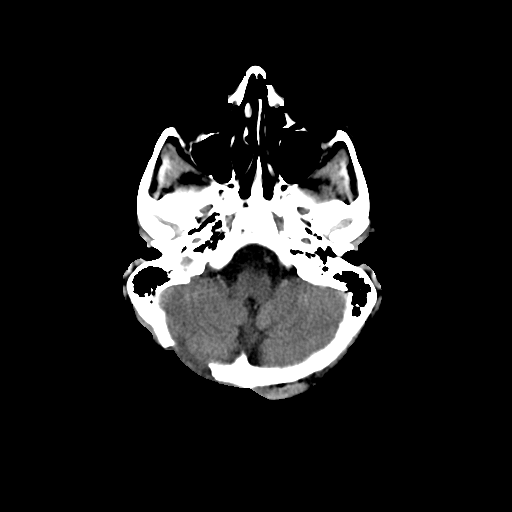
[im 4/30  bone]
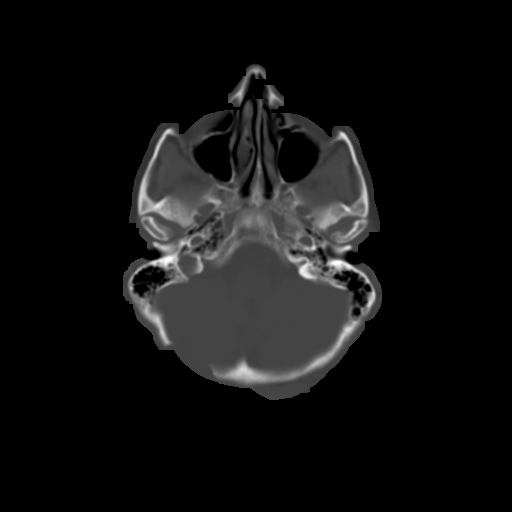
[im 7/30  brain]
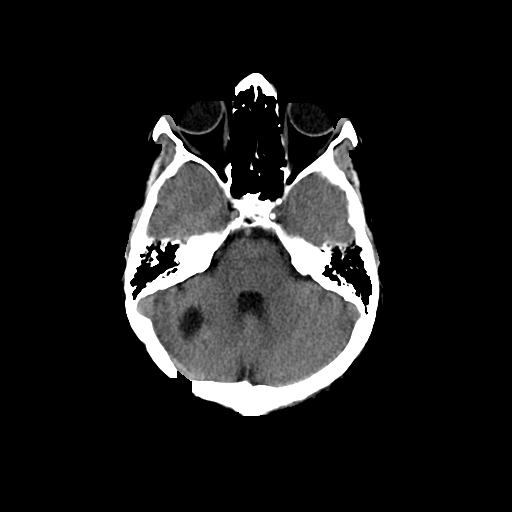
[im 10/30  brain]
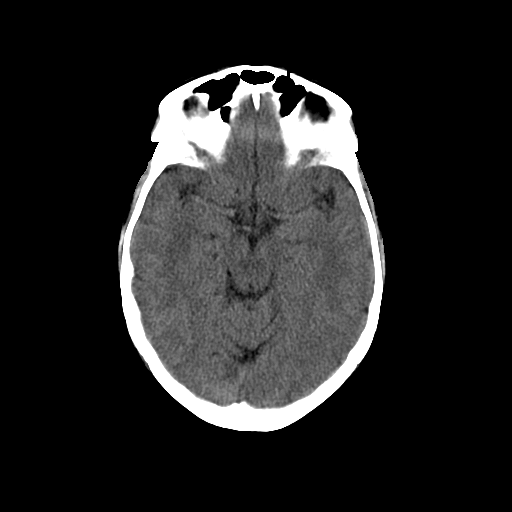
[im 13/30  brain]
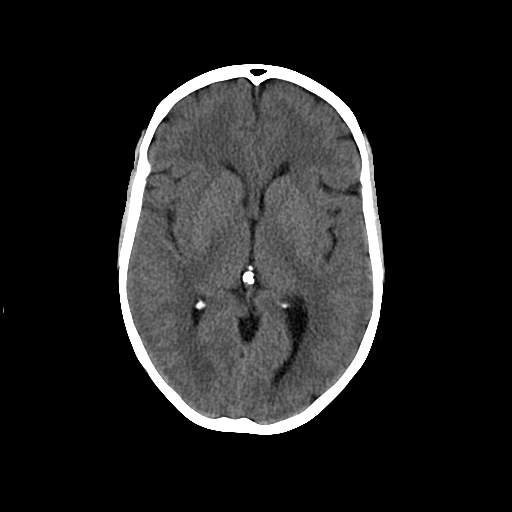
[im 17/30  brain]
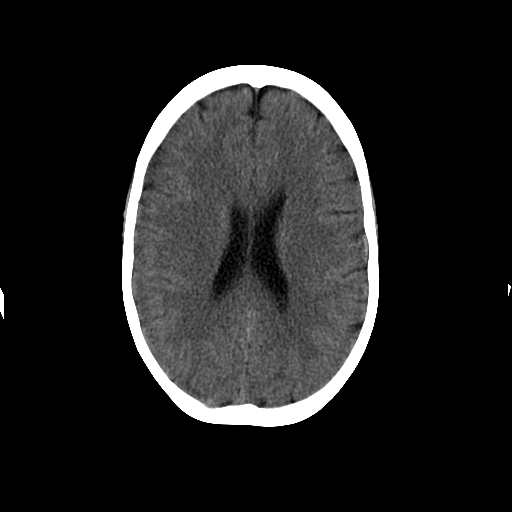
[im 17/30  bone]
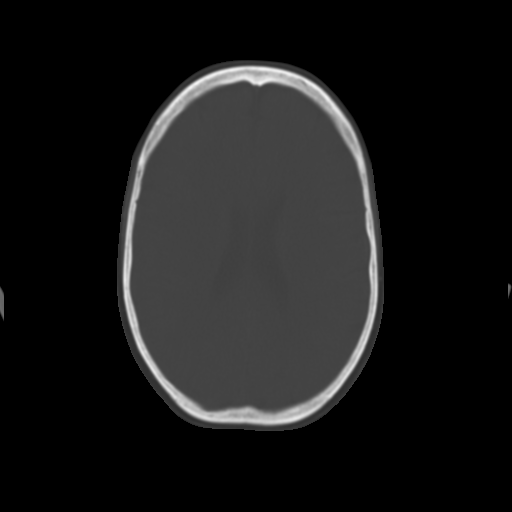
[im 20/30  brain]
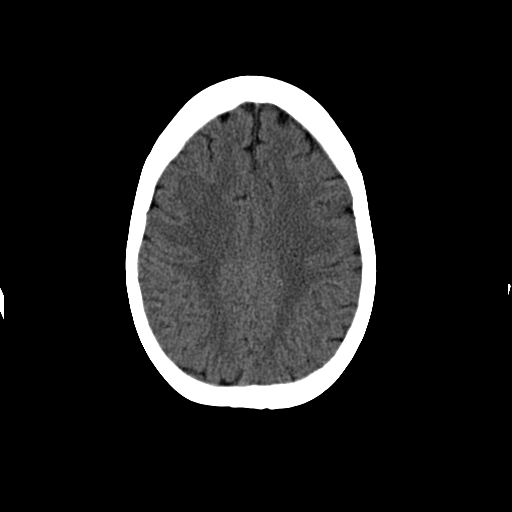
[im 23/30  brain]
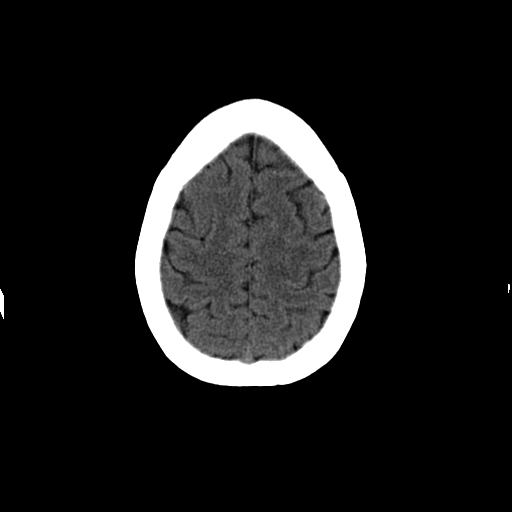
[im 26/30  brain]
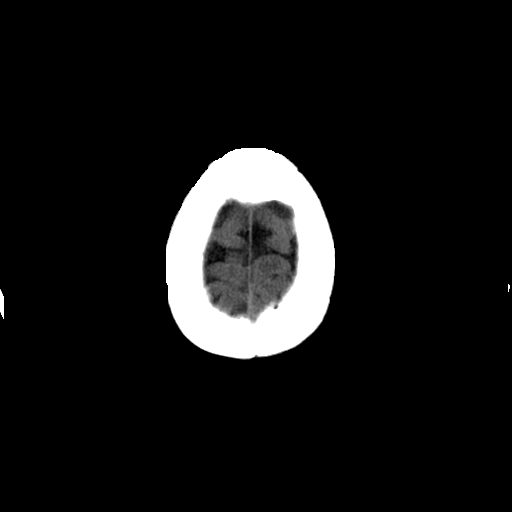

[Series 202: head w/o bone, idose (1) · axial · non-contrast · 0.49mm/px · z∈[+888,+1003]mm · 8 of 60 slices shown]
[im 7/60  bone]
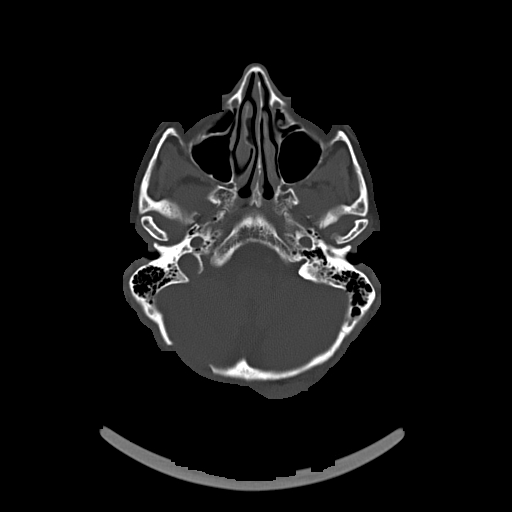
[im 13/60  bone]
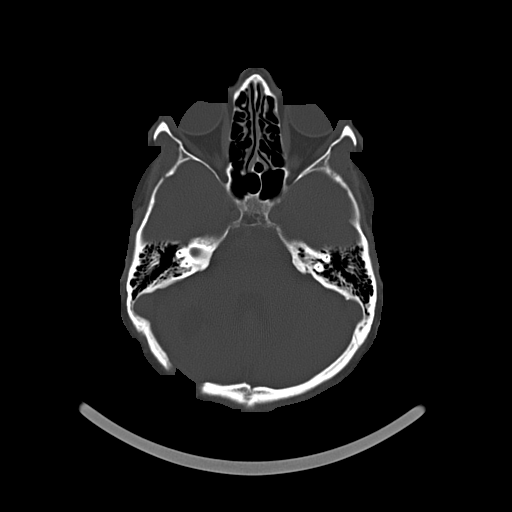
[im 19/60  bone]
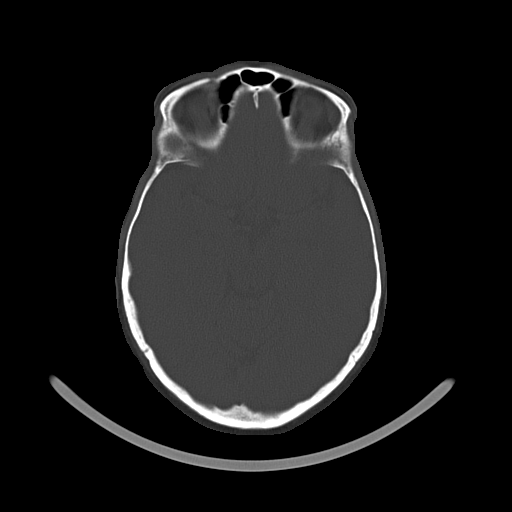
[im 25/60  bone]
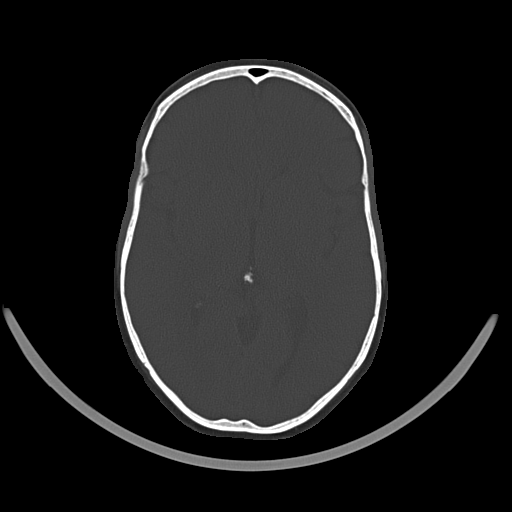
[im 35/60  bone]
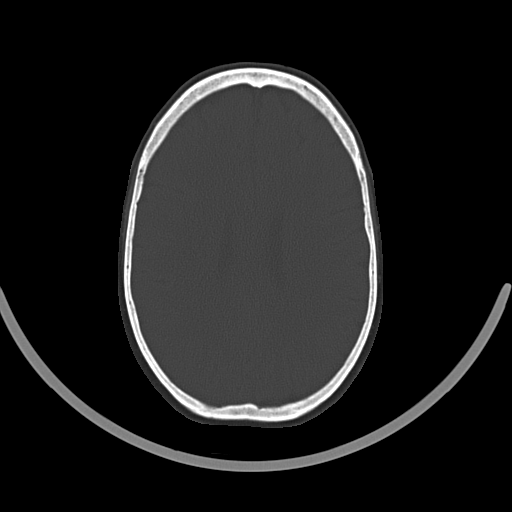
[im 41/60  bone]
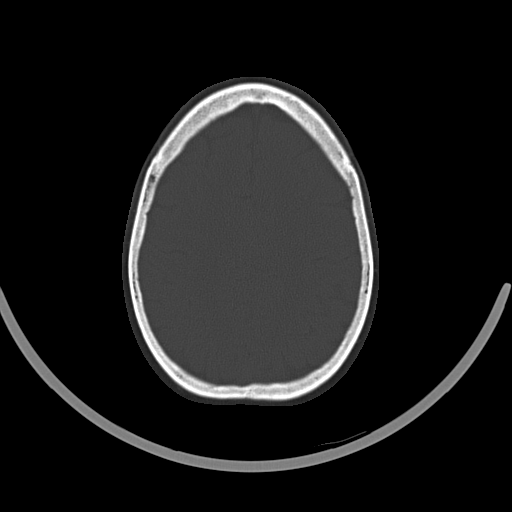
[im 47/60  bone]
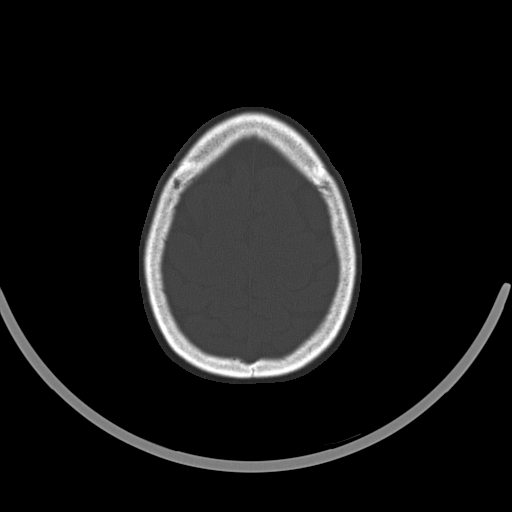
[im 53/60  bone]
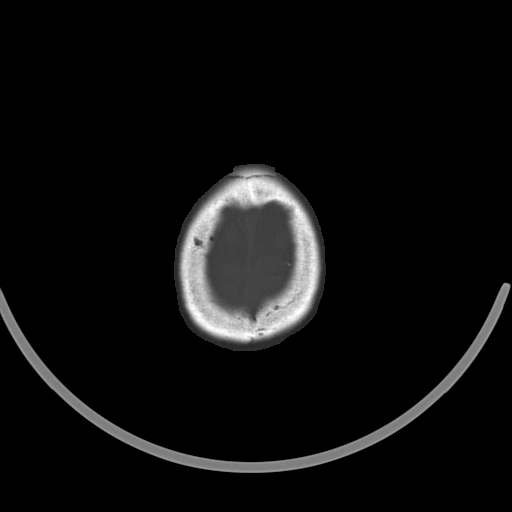

[16 of 30 positions shown; findings below may reference images not displayed]

FINDINGS: No intraparenchymal hemorrhage, mass effect, midline shift. No acute
large vascular territory infarcts. No hydrocephalus.

Status post right occipital craniectomy with right cerebellar cystic
resection cavity, unchanged. Basal cisterns are patent. Mild
calcific atherosclerosis of the carotid siphons.

No skull fracture. Visualized paranasal sinuses and mastoid air
cells are well aerated. Left of the osteoarthrosis.
IMPRESSION: No acute intracranial process.

Right occipital craniectomy with stable appearance of the subjacent
right cerebellar resection cavity on this noncontrast examination.

  By: Blondinacka Samsonaite

## 2015-03-16 ENCOUNTER — Inpatient Hospital Stay
Admission: RE | Admit: 2015-03-16 | Discharge: 2015-03-16 | Disposition: A | Discharge status: Home / Self Care | Payer: Self-pay | Source: Ambulatory Visit | Attending: Radiation Oncology | Admitting: Radiation Oncology

## 2015-03-16 ENCOUNTER — Other Ambulatory Visit: Payer: Self-pay | Admitting: Radiation Oncology

## 2015-03-16 DIAGNOSIS — C801 Malignant (primary) neoplasm, unspecified: Secondary | ICD-10-CM

## 2015-03-21 ENCOUNTER — Encounter (HOSPITAL_COMMUNITY): Payer: Self-pay | Admitting: Radiology

## 2015-03-21 ENCOUNTER — Inpatient Hospital Stay (HOSPITAL_COMMUNITY): Payer: 59

## 2015-03-21 ENCOUNTER — Emergency Department (HOSPITAL_COMMUNITY): Payer: 59

## 2015-03-21 ENCOUNTER — Inpatient Hospital Stay (HOSPITAL_COMMUNITY)
Admission: EM | Admit: 2015-03-21 | Discharge: 2015-03-25 | DRG: 070 | Disposition: A | Discharge status: Home / Self Care | Payer: 59 | Attending: Internal Medicine | Admitting: Internal Medicine

## 2015-03-21 DIAGNOSIS — R4182 Altered mental status, unspecified: Secondary | ICD-10-CM | POA: Diagnosis not present

## 2015-03-21 DIAGNOSIS — Z87891 Personal history of nicotine dependence: Secondary | ICD-10-CM | POA: Diagnosis not present

## 2015-03-21 DIAGNOSIS — R69 Illness, unspecified: Secondary | ICD-10-CM | POA: Diagnosis not present

## 2015-03-21 DIAGNOSIS — R51 Headache: Secondary | ICD-10-CM | POA: Diagnosis present

## 2015-03-21 DIAGNOSIS — E876 Hypokalemia: Secondary | ICD-10-CM | POA: Diagnosis present

## 2015-03-21 DIAGNOSIS — R911 Solitary pulmonary nodule: Secondary | ICD-10-CM | POA: Diagnosis present

## 2015-03-21 DIAGNOSIS — Z885 Allergy status to narcotic agent status: Secondary | ICD-10-CM

## 2015-03-21 DIAGNOSIS — G9340 Encephalopathy, unspecified: Principal | ICD-10-CM | POA: Diagnosis present

## 2015-03-21 DIAGNOSIS — E039 Hypothyroidism, unspecified: Secondary | ICD-10-CM | POA: Diagnosis present

## 2015-03-21 DIAGNOSIS — Z79899 Other long term (current) drug therapy: Secondary | ICD-10-CM

## 2015-03-21 DIAGNOSIS — C7931 Secondary malignant neoplasm of brain: Secondary | ICD-10-CM | POA: Diagnosis present

## 2015-03-21 DIAGNOSIS — Z8589 Personal history of malignant neoplasm of other organs and systems: Secondary | ICD-10-CM | POA: Diagnosis present

## 2015-03-21 DIAGNOSIS — K219 Gastro-esophageal reflux disease without esophagitis: Secondary | ICD-10-CM | POA: Diagnosis present

## 2015-03-21 DIAGNOSIS — F0390 Unspecified dementia without behavioral disturbance: Secondary | ICD-10-CM | POA: Diagnosis present

## 2015-03-21 DIAGNOSIS — C7800 Secondary malignant neoplasm of unspecified lung: Secondary | ICD-10-CM | POA: Diagnosis not present

## 2015-03-21 DIAGNOSIS — Z923 Personal history of irradiation: Secondary | ICD-10-CM

## 2015-03-21 DIAGNOSIS — D649 Anemia, unspecified: Secondary | ICD-10-CM | POA: Diagnosis present

## 2015-03-21 DIAGNOSIS — E43 Unspecified severe protein-calorie malnutrition: Secondary | ICD-10-CM | POA: Diagnosis present

## 2015-03-21 DIAGNOSIS — Z9221 Personal history of antineoplastic chemotherapy: Secondary | ICD-10-CM | POA: Diagnosis not present

## 2015-03-21 DIAGNOSIS — Z9889 Other specified postprocedural states: Secondary | ICD-10-CM | POA: Diagnosis not present

## 2015-03-21 DIAGNOSIS — F419 Anxiety disorder, unspecified: Secondary | ICD-10-CM | POA: Diagnosis present

## 2015-03-21 DIAGNOSIS — Z888 Allergy status to other drugs, medicaments and biological substances status: Secondary | ICD-10-CM

## 2015-03-21 DIAGNOSIS — K210 Gastro-esophageal reflux disease with esophagitis: Secondary | ICD-10-CM

## 2015-03-21 DIAGNOSIS — R112 Nausea with vomiting, unspecified: Secondary | ICD-10-CM | POA: Diagnosis present

## 2015-03-21 DIAGNOSIS — Z85820 Personal history of malignant melanoma of skin: Secondary | ICD-10-CM | POA: Diagnosis not present

## 2015-03-21 DIAGNOSIS — G4489 Other headache syndrome: Secondary | ICD-10-CM | POA: Diagnosis not present

## 2015-03-21 DIAGNOSIS — C439 Malignant melanoma of skin, unspecified: Secondary | ICD-10-CM

## 2015-03-21 DIAGNOSIS — C7949 Secondary malignant neoplasm of other parts of nervous system: Secondary | ICD-10-CM

## 2015-03-21 HISTORY — DX: Encephalopathy, unspecified: G93.40

## 2015-03-21 LAB — URINALYSIS, ROUTINE W REFLEX MICROSCOPIC
Bilirubin Urine: NEGATIVE
Glucose, UA: 250 mg/dL — AB
KETONES UR: NEGATIVE mg/dL
NITRITE: NEGATIVE
PH: 6 (ref 5.0–8.0)
PROTEIN: NEGATIVE mg/dL
Specific Gravity, Urine: >1.046 — ABNORMAL HIGH (ref 1.005–1.030)
Urobilinogen, UA: 0.2 mg/dL (ref 0.0–1.0)

## 2015-03-21 LAB — CSF CELL COUNT WITH DIFFERENTIAL
RBC COUNT CSF: 156 /mm3 — AB
RBC Count, CSF: 2 /mm3 — ABNORMAL HIGH
Tube #: 4
Tube #: 1
WBC CSF: 1 /mm3 (ref 0–5)
WBC, CSF: 0 /mm3 (ref 0–5)

## 2015-03-21 LAB — RAPID URINE DRUG SCREEN, HOSP PERFORMED
AMPHETAMINES: NOT DETECTED
Barbiturates: NOT DETECTED
Benzodiazepines: POSITIVE — AB
Cocaine: NOT DETECTED
Opiates: NOT DETECTED
Tetrahydrocannabinol: POSITIVE — AB

## 2015-03-21 LAB — COMPREHENSIVE METABOLIC PANEL
ALBUMIN: 4.2 g/dL (ref 3.5–5.2)
ALT: 19 U/L (ref 0–35)
AST: 31 U/L (ref 0–37)
Alkaline Phosphatase: 75 U/L (ref 39–117)
Anion gap: 12 (ref 5–15)
BUN: 13 mg/dL (ref 6–23)
CALCIUM: 10.9 mg/dL — AB (ref 8.4–10.5)
CHLORIDE: 98 mmol/L (ref 96–112)
CO2: 25 mmol/L (ref 19–32)
CREATININE: 1.18 mg/dL — AB (ref 0.50–1.10)
GFR calc Af Amer: 59 mL/min — ABNORMAL LOW (ref >90)
GFR calc non Af Amer: 51 mL/min — ABNORMAL LOW (ref >90)
Glucose, Bld: 204 mg/dL — ABNORMAL HIGH (ref 70–99)
Potassium: 3.5 mmol/L (ref 3.5–5.1)
Sodium: 135 mmol/L (ref 135–145)
Total Bilirubin: 1 mg/dL (ref 0.3–1.2)
Total Protein: 7.1 g/dL (ref 6.0–8.3)

## 2015-03-21 LAB — CBC
HCT: 41.1 % (ref 36.0–46.0)
Hemoglobin: 13.8 g/dL (ref 12.0–15.0)
MCH: 30.3 pg (ref 26.0–34.0)
MCHC: 33.6 g/dL (ref 30.0–36.0)
MCV: 90.1 fL (ref 78.0–100.0)
Platelets: 280 10*3/uL (ref 150–400)
RBC: 4.56 MIL/uL (ref 3.87–5.11)
RDW: 13.4 % (ref 11.5–15.5)
WBC: 12.6 10*3/uL — AB (ref 4.0–10.5)

## 2015-03-21 LAB — PROTIME-INR
INR: 1.2 (ref 0.00–1.49)
PROTHROMBIN TIME: 15.4 s — AB (ref 11.6–15.2)

## 2015-03-21 LAB — I-STAT CHEM 8, ED
BUN: 15 mg/dL (ref 6–23)
CALCIUM ION: 1.4 mmol/L — AB (ref 1.12–1.23)
Chloride: 98 mmol/L (ref 96–112)
Creatinine, Ser: 1.1 mg/dL (ref 0.50–1.10)
GLUCOSE: 204 mg/dL — AB (ref 70–99)
HCT: 46 % (ref 36.0–46.0)
HEMOGLOBIN: 15.6 g/dL — AB (ref 12.0–15.0)
Potassium: 3.6 mmol/L (ref 3.5–5.1)
Sodium: 136 mmol/L (ref 135–145)
TCO2: 24 mmol/L (ref 0–100)

## 2015-03-21 LAB — DIFFERENTIAL
BASOS ABS: 0 10*3/uL (ref 0.0–0.1)
Basophils Relative: 0 % (ref 0–1)
EOS ABS: 0.2 10*3/uL (ref 0.0–0.7)
Eosinophils Relative: 1 % (ref 0–5)
Lymphocytes Relative: 13 % (ref 12–46)
Lymphs Abs: 1.6 10*3/uL (ref 0.7–4.0)
MONOS PCT: 9 % (ref 3–12)
Monocytes Absolute: 1.1 10*3/uL — ABNORMAL HIGH (ref 0.1–1.0)
NEUTROS ABS: 9.7 10*3/uL — AB (ref 1.7–7.7)
NEUTROS PCT: 77 % (ref 43–77)

## 2015-03-21 LAB — GRAM STAIN: Gram Stain: NONE SEEN

## 2015-03-21 LAB — ETHANOL: Alcohol, Ethyl (B): <5 mg/dL (ref 0–9)

## 2015-03-21 LAB — I-STAT TROPONIN, ED: Troponin i, poc: 0.01 ng/mL (ref 0.00–0.08)

## 2015-03-21 LAB — APTT: aPTT: 33 seconds (ref 24–37)

## 2015-03-21 LAB — TSH: TSH: 7.001 u[IU]/mL — ABNORMAL HIGH (ref 0.350–4.500)

## 2015-03-21 LAB — URINE MICROSCOPIC-ADD ON

## 2015-03-21 LAB — PROTEIN, CSF: TOTAL PROTEIN, CSF: 68 mg/dL — AB (ref 15–45)

## 2015-03-21 LAB — AMMONIA: AMMONIA: 77 umol/L — AB (ref 11–32)

## 2015-03-21 LAB — GLUCOSE, CSF: Glucose, CSF: 98 mg/dL — ABNORMAL HIGH (ref 43–76)

## 2015-03-21 MED ORDER — ACYCLOVIR SODIUM 50 MG/ML IV SOLN
450.0000 mg | Freq: Two times a day (BID) | INTRAVENOUS | Status: DC
  Administered 2015-03-22 – 2015-03-24 (×6): 450 mg via INTRAVENOUS
  Filled 2015-03-21 (×9): qty 9

## 2015-03-21 MED ORDER — IOHEXOL 350 MG/ML SOLN
50.0000 mL | Freq: Once | INTRAVENOUS | Status: AC | PRN
  Administered 2015-03-21: 50 mL via INTRAVENOUS

## 2015-03-21 MED ORDER — GADOBENATE DIMEGLUMINE 529 MG/ML IV SOLN
10.0000 mL | Freq: Once | INTRAVENOUS | Status: AC | PRN
  Administered 2015-03-21: 10 mL via INTRAVENOUS

## 2015-03-21 MED ORDER — HYDROCORTISONE 2.5 % EX CREA: 1.0000 "application " | TOPICAL_CREAM | CUTANEOUS | Status: DC | PRN

## 2015-03-21 MED ORDER — SODIUM CHLORIDE 0.9 % IV SOLN
INTRAVENOUS | Status: DC
  Administered 2015-03-21 – 2015-03-23 (×3): via INTRAVENOUS

## 2015-03-21 MED ORDER — ALUM & MAG HYDROXIDE-SIMETH 200-200-20 MG/5ML PO SUSP: 30.0000 mL | Freq: Four times a day (QID) | ORAL | Status: DC | PRN

## 2015-03-21 MED ORDER — PANTOPRAZOLE SODIUM 40 MG PO TBEC
40.0000 mg | DELAYED_RELEASE_TABLET | Freq: Every day | ORAL | Status: DC
  Administered 2015-03-22 – 2015-03-25 (×4): 40 mg via ORAL
  Filled 2015-03-21 (×4): qty 1

## 2015-03-21 MED ORDER — CITALOPRAM HYDROBROMIDE 10 MG PO TABS
20.0000 mg | ORAL_TABLET | Freq: Every morning | ORAL | Status: DC
  Administered 2015-03-22 – 2015-03-25 (×4): 20 mg via ORAL
  Filled 2015-03-21 (×4): qty 2

## 2015-03-21 MED ORDER — SODIUM CHLORIDE 0.9 % IJ SOLN
3.0000 mL | Freq: Two times a day (BID) | INTRAMUSCULAR | Status: DC
  Administered 2015-03-22 – 2015-03-25 (×4): 3 mL via INTRAVENOUS

## 2015-03-21 MED ORDER — HYDROCORTISONE 1 % EX CREA
TOPICAL_CREAM | CUTANEOUS | Status: DC | PRN
  Filled 2015-03-21: qty 28

## 2015-03-21 MED ORDER — LIDOCAINE HCL 2 % IJ SOLN
10.0000 mL | Freq: Once | INTRAMUSCULAR | Status: AC
  Administered 2015-03-21: 200 mg via INTRADERMAL
  Filled 2015-03-21: qty 20

## 2015-03-21 MED ORDER — LORAZEPAM 0.5 MG PO TABS
0.5000 mg | ORAL_TABLET | Freq: Two times a day (BID) | ORAL | Status: DC | PRN
  Administered 2015-03-21 – 2015-03-25 (×7): 0.5 mg via ORAL
  Filled 2015-03-21 (×7): qty 1

## 2015-03-21 MED ORDER — LIDOCAINE HCL (PF) 1 % IJ SOLN
INTRAMUSCULAR | Status: AC
  Filled 2015-03-21: qty 5

## 2015-03-21 MED ORDER — LEVOTHYROXINE SODIUM 100 MCG PO TABS
100.0000 ug | ORAL_TABLET | Freq: Every day | ORAL | Status: DC
  Administered 2015-03-22 – 2015-03-25 (×4): 100 ug via ORAL
  Filled 2015-03-21 (×4): qty 1

## 2015-03-21 MED ORDER — HEPARIN SODIUM (PORCINE) 5000 UNIT/ML IJ SOLN
5000.0000 [IU] | Freq: Three times a day (TID) | INTRAMUSCULAR | Status: DC
  Administered 2015-03-21 – 2015-03-25 (×11): 5000 [IU] via SUBCUTANEOUS
  Filled 2015-03-21 (×11): qty 1

## 2015-03-21 MED ORDER — PIROXICAM 20 MG PO CAPS
20.0000 mg | ORAL_CAPSULE | Freq: Every day | ORAL | Status: DC
  Administered 2015-03-22 – 2015-03-25 (×4): 20 mg via ORAL
  Filled 2015-03-21 (×4): qty 1

## 2015-03-21 NOTE — Progress Notes (Signed)
ANTI-INFECTIVE CONSULT NOTE - INITIAL  Pharmacy Consult for acyclovir Indication: CNS  Allergies  Allergen Reactions  . Morphine And Related Nausea Only  . Scopolamine Rash    Patient Measurements: Height: '5\' 7"'$  (170.2 cm) Weight: 92 lb 9.5 oz (42 kg) IBW/kg (Calculated) : 61.6  Vital Signs: Temp: 98 F (36.7 C) (04/23 2246) Temp Source: Oral (04/23 2246) BP: 140/93 mmHg (04/23 2246) Pulse Rate: 118 (04/23 2246)  Labs:  Recent Labs  03/21/15 1456 03/21/15 1512  WBC 12.6*  --   HGB 13.8 15.6*  PLT 280  --   CREATININE 1.18* 1.10   Estimated Creatinine Clearance: 38.8 mL/min (by C-G formula based on Cr of 1.1).    Microbiology: Recent Results (from the past 720 hour(s))  Gram stain     Status: None   Collection Time: 03/21/15  4:33 PM  Result Value Ref Range Status   Specimen Description CSF  Final   Special Requests TUBE 2  Final   Gram Stain NO WBC SEEN NO ORGANISMS SEEN   Final   Report Status 03/21/2015 FINAL  Final    Medical History: Past Medical History  Diagnosis Date  . TMJ (dislocation of temporomandibular joint)   . Thyroid disease   . Lung cancer     awaiting pathology  . Brain cancer     awaiting pathology; assuming this is a lung primary with brain mets  . Dementia   . Hypothyroidism   . GERD (gastroesophageal reflux disease)   . Anxiety     Medications:  Prescriptions prior to admission  Medication Sig Dispense Refill Last Dose  . citalopram (CELEXA) 20 MG tablet Take 20 mg by mouth every morning.    03/21/2015 at Unknown time  . esomeprazole (NEXIUM) 20 MG capsule Take 20 mg by mouth daily at 12 noon.   03/21/2015 at Unknown time  . hydrocortisone 2.5 % cream Apply 1 application topically as needed (for rash., itching).   03/20/2015 at Unknown time  . levothyroxine (SYNTHROID, LEVOTHROID) 100 MCG tablet Take 100 mcg by mouth daily before breakfast.   03/21/2015 at Unknown time  . LORazepam (ATIVAN) 1 MG tablet Take 1 tablet (1 mg  total) by mouth every 12 (twelve) hours as needed for anxiety or sleep. (Patient taking differently: Take 1 mg by mouth at bedtime. ) 60 tablet 0 03/20/2015 at Unknown time  . metoCLOPramide (REGLAN) 10 MG tablet Take 10 mg by mouth 3 (three) times daily as needed for nausea or vomiting.   03/21/2015 at Unknown time  . ondansetron (ZOFRAN) 4 MG tablet Take 1 tablet (4 mg total) by mouth every 6 (six) hours as needed for nausea. 20 tablet 0 03/21/2015 at Unknown time  . piroxicam (FELDENE) 20 MG capsule Take 20 mg by mouth daily.   03/21/2015 at Unknown time  . prochlorperazine (COMPAZINE) 10 MG tablet Take 10 mg by mouth every 6 (six) hours as needed for nausea or vomiting.   Past Month at Unknown time  . traMADol (ULTRAM) 50 MG tablet Take 1 tablet (50 mg total) by mouth every 12 (twelve) hours as needed for moderate pain. 50 tablet 0 03/21/2015 at Unknown time  . potassium chloride 20 MEQ TBCR Take 10 mEq by mouth daily. (Patient not taking: Reported on 12/01/2014) 5 tablet 0 Not Taking  . promethazine (PHENERGAN) 25 MG suppository Place 1 suppository (25 mg total) rectally every 6 (six) hours as needed for nausea, vomiting or refractory nausea / vomiting. (Patient not taking: Reported  on 12/01/2014) 45 each 0 Not Taking   Scheduled:  . [START ON 03/22/2015] citalopram  20 mg Oral q morning - 10a  . heparin  5,000 Units Subcutaneous 3 times per day  . [START ON 03/22/2015] levothyroxine  100 mcg Oral QAC breakfast  . [START ON 03/22/2015] pantoprazole  40 mg Oral Daily  . [START ON 03/22/2015] piroxicam  20 mg Oral Daily  . sodium chloride  3 mL Intravenous Q12H   Infusions:  . sodium chloride      Assessment: 54yo female presents w/ AMS, intermittently followed commands in ED then became mute w/ stare, has h/o metastatic melanoma w/ brain mets and subsequent negative MRIs at [facility], no organisms on CSF though concern for viral infection, to begin acyclovir.  Plan:  Will begin acyclovir '450mg'$  IV Q12H and  monitor CBC, Cx.  Wynona Neat, PharmD, BCPS  03/21/2015,10:53 PM

## 2015-03-21 NOTE — Progress Notes (Signed)
Report taken from [place], [place] in ED. RN stated that patient will be going to MRI prior to coming to the floor.

## 2015-03-21 NOTE — Progress Notes (Signed)
Patient arrived to 4N13. Alert and oriented x 4. Telemetry applied. Husband at bedside. Will continue to monitor patient closely. Burnell Blanks, RN

## 2015-03-21 NOTE — ED Provider Notes (Addendum)
CSN: [id number]     Arrival date & time 03/21/15  1428 History   First MD Initiated Contact with Patient 03/21/15 1437     Chief Complaint  Patient presents with  . Altered Mental Status  . Code Stroke    '@EDPCLEARED'$ @ (Consider location/radiation/quality/duration/timing/severity/associated sxs/prior Treatment) HPI Comments: Patient presents to the ER for evaluation of acute alteration of mental status. Patient was camping with her husband yesterday. They were going home from camping today when she acutely had change in her behavior. Husband reports that 2-1/2 hours before she arrived in the ER, she fell asleep in the car. She had been normal at that time. When she woke up she was extremely confused and could not talk.  Patient reports a history of melanoma status post resection and treatment at [facility]. Husband reports that she has had 3 scans in a row at [facility] that have showed no recurrence of the tumor.  Husband reports that she did have acute diarrhea yesterday, today had been feeling better.  Patient is a 54 y.o. female presenting with altered mental status.  Altered Mental Status Presenting symptoms: confusion     Past Medical History  Diagnosis Date  . TMJ (dislocation of temporomandibular joint)   . Thyroid disease   . Lung cancer     awaiting pathology  . Brain cancer     awaiting pathology; assuming this is a lung primary with brain mets  . Dementia   . Hypothyroidism   . GERD (gastroesophageal reflux disease)   . Anxiety    Past Surgical History  Procedure Laterality Date  . Tonsillectomy    . Appendectomy    . Suboccipital craniectomy cervical laminectomy N/A 11/19/2013    Procedure: SUBOCCIPITAL CRANIECTOMY for tumor;  Surgeon: Eustace Moore, MD;  Location: [place] [facility];  Service: Neurosurgery;  Laterality: N/A;  . Lumbar wound debridement N/A 12/05/2013    Procedure: irrigation and debridement posterior cervical wound;  Surgeon: Eustace Moore, MD;  Location: [place] [facility]  [facility];  Service: Neurosurgery;  Laterality: N/A;  . Video assisted thoracoscopy (vats)/wedge resection Left 03/05/2014    Procedure: VIDEO ASSISTED THORACOSCOPY (VATS)/WEDGE RESECTION;  Surgeon: Melrose Nakayama, MD;  Location: [place];  Service: Thoracic;  Laterality: Left;   Family History  Problem Relation Age of Onset  . Dementia Mother   . Cancer - Other Other    History  Substance Use Topics  . Smoking status: Former Smoker    Quit date: 11/19/2013  . Smokeless tobacco: Never Used  . Alcohol Use: No   OB History    No data available     Review of Systems  Neurological: Positive for speech difficulty.  Psychiatric/Behavioral: Positive for confusion.  All other systems reviewed and are negative.     Allergies  Morphine and related and Scopolamine  Home Medications   Prior to Admission medications   Medication Sig Start Date End Date Taking? Authorizing Provider  citalopram (CELEXA) 20 MG tablet Take 20 mg by mouth every morning.  07/14/14 07/14/15 Yes Historical Provider, MD  esomeprazole (NEXIUM) 20 MG capsule Take 20 mg by mouth daily at 12 noon.   Yes Historical Provider, MD  hydrocortisone 2.5 % cream Apply 1 application topically as needed (for rash., itching).   Yes Historical Provider, MD  levothyroxine (SYNTHROID, LEVOTHROID) 100 MCG tablet Take 100 mcg by mouth daily before breakfast.   Yes Historical Provider, MD  LORazepam (ATIVAN) 1 MG tablet Take 1 tablet (1 mg total) by mouth  every 12 (twelve) hours as needed for anxiety or sleep. Patient taking differently: Take 1 mg by mouth at bedtime.  05/03/14  Yes Robbie Lis, MD  metoCLOPramide (REGLAN) 10 MG tablet Take 10 mg by mouth 3 (three) times daily as needed for nausea or vomiting.   Yes Historical Provider, MD  ondansetron (ZOFRAN) 4 MG tablet Take 1 tablet (4 mg total) by mouth every 6 (six) hours as needed for nausea. 05/03/14  Yes Robbie Lis, MD  piroxicam (FELDENE) 20 MG capsule Take 20 mg by mouth daily.    Yes Historical Provider, MD  prochlorperazine (COMPAZINE) 10 MG tablet Take 10 mg by mouth every 6 (six) hours as needed for nausea or vomiting.   Yes Historical Provider, MD  traMADol (ULTRAM) 50 MG tablet Take 1 tablet (50 mg total) by mouth every 12 (twelve) hours as needed for moderate pain. 12/01/14  Yes Tyler Pita, MD  potassium chloride 20 MEQ TBCR Take 10 mEq by mouth daily. Patient not taking: Reported on 12/01/2014 05/03/14   Robbie Lis, MD  promethazine (PHENERGAN) 25 MG suppository Place 1 suppository (25 mg total) rectally every 6 (six) hours as needed for nausea, vomiting or refractory nausea / vomiting. Patient not taking: Reported on 12/01/2014 05/03/14   Robbie Lis, MD   BP 146/86 mmHg  Pulse 112  Temp(Src) 97.4 F (36.3 C) (Oral)  Resp 19  SpO2 94% Physical Exam  Constitutional: She appears well-developed and well-nourished. No distress.  HENT:  Head: Normocephalic and atraumatic.  Right Ear: Hearing normal.  Left Ear: Hearing normal.  Nose: Nose normal.  Mouth/Throat: Oropharynx is clear and moist and mucous membranes are normal.  Eyes: Conjunctivae and EOM are normal. Pupils are equal, round, and reactive to light.  Neck: Normal range of motion. Neck supple.  Cardiovascular: Regular rhythm, S1 normal and S2 normal.  Exam reveals no gallop and no friction rub.   No murmur heard. Pulmonary/Chest: Effort normal and breath sounds normal. No respiratory distress. She exhibits no tenderness.  Abdominal: Soft. Normal appearance and bowel sounds are normal. There is no hepatosplenomegaly. There is no tenderness. There is no rebound, no guarding, no tenderness at McBurney's point and negative Murphy's sign. No hernia.  Musculoskeletal: Normal range of motion.  Neurological: She is alert. She has normal strength. She is disoriented Barrister's clerk and time). No cranial nerve deficit or sensory deficit. Coordination normal. GCS eye subscore is 4. GCS verbal subscore is 5. GCS motor  subscore is 6.  Patient with very odd behavior. She will not focus on objects, track objects or make eye contact when speaking, is very delayed but does eventually answer questions. She also is very delayed but does follow commands. No obvious unilateral weakness noted.  Skin: Skin is warm, dry and intact. No rash noted. No cyanosis.  Psychiatric: She has a normal mood and affect. Her speech is normal and behavior is normal. Thought content normal.  Nursing note and vitals reviewed.   ED Course  LUMBAR PUNCTURE Date/Time: 03/21/2015 4:39 PM Performed by: Orpah Greek Authorized by: Orpah Greek Consent: Verbal consent obtained. Written consent obtained. Risks and benefits: risks, benefits and alternatives were discussed Consent given by: patient and spouse Patient understanding: patient states understanding of the procedure being performed Patient consent: the patient's understanding of the procedure matches consent given Procedure consent: procedure consent matches procedure scheduled Relevant documents: relevant documents present and verified Test results: test results available and properly labeled Site marked: the  operative site was marked Patient identity confirmed: verbally with patient and hospital-assigned identification number Time out: Immediately prior to procedure a "time out" was called to verify the correct patient, procedure, equipment, support staff and site/side marked as required. Indications: evaluation for infection and evaluation for altered mental status Anesthesia: local infiltration Local anesthetic: lidocaine 2% without epinephrine Anesthetic total: 6 ml Patient sedated: no Lumbar space: L3-L4 interspace Patient's position: sitting Needle gauge: 20 Needle type: spinal needle - Quincke tip Needle length: 3.5 in Number of attempts: 3 Opening pressure: 26 cm H2O Fluid appearance: clear and blood-tinged then clearing Tubes of fluid: 4 Total  volume: 4 ml Post-procedure: site cleaned, pressure dressing applied and adhesive bandage applied Patient tolerance: Patient tolerated the procedure well with no immediate complications   (including critical care time) Labs Review Labs Reviewed  PROTIME-INR - Abnormal; Notable for the following:    Prothrombin Time 15.4 (*)    All other components within normal limits  CBC - Abnormal; Notable for the following:    WBC 12.6 (*)    All other components within normal limits  DIFFERENTIAL - Abnormal; Notable for the following:    Neutro Abs 9.7 (*)    Monocytes Absolute 1.1 (*)    All other components within normal limits  COMPREHENSIVE METABOLIC PANEL - Abnormal; Notable for the following:    Glucose, Bld 204 (*)    Creatinine, Ser 1.18 (*)    Calcium 10.9 (*)    GFR calc non Af Amer 51 (*)    GFR calc Af Amer 59 (*)    All other components within normal limits  I-STAT CHEM 8, ED - Abnormal; Notable for the following:    Glucose, Bld 204 (*)    Calcium, Ion 1.40 (*)    Hemoglobin 15.6 (*)    All other components within normal limits  CSF CULTURE  GRAM STAIN  ETHANOL  APTT  URINE RAPID DRUG SCREEN (HOSP PERFORMED)  URINALYSIS, ROUTINE W REFLEX MICROSCOPIC  CSF CELL COUNT WITH DIFFERENTIAL  CSF CELL COUNT WITH DIFFERENTIAL  GLUCOSE, CSF  PROTEIN, CSF  HERPES SIMPLEX VIRUS(HSV) DNA BY PCR  B. BURGDORFI ANTIBODIES, CSF  WEST NILE AB, IGG AND IGM, CSF  ENTEROVIRUS PCR  AMMONIA  TSH  VITAMIN B12  ROCKY MTN SPOTTED FVR ABS PNL(IGG+IGM)  B. BURGDORFI ANTIBODIES  I-STAT TROPOININ, ED  I-STAT TROPOININ, ED    Imaging Review Ct Angio Head W/cm &/or Wo Cm  03/21/2015   CLINICAL DATA:  Altered mental status. Woke up from nap CT at chronic. Code stroke.  EXAM: CT ANGIOGRAPHY HEAD  TECHNIQUE: Multidetector CT imaging of the head was performed using the standard protocol during bolus administration of intravenous contrast. Multiplanar CT image reconstructions and MIPs were obtained  to evaluate the vascular anatomy.  CONTRAST:  80m OMNIPAQUE IOHEXOL 350 MG/ML SOLN  COMPARISON:  CT head without contrast from the same day. MRI brain 02/25/2015.  FINDINGS: CT HEAD  Brain: Postsurgical changes of the right cerebellum are again noted. No acute cortical infarct, hemorrhage, or mass lesion is present. There is no pathologic enhancement. The basal ganglia are intact. Insular cortex is intact.  Calvarium and skull base: Right occipital craniotomy is noted. The calvarium is otherwise intact.  Paranasal sinuses: Mild mucosal thickening is present in the anterior ethmoid air cells bilaterally and a left agger nasi cell  Orbits: Within normal limits  CTA HEAD  Anterior circulation: Minimal atherosclerotic calcifications are present within the cavernous internal carotid arteries. There are  no significant stenoses. The ICA termini are normal bilaterally.  Anterior communicating artery is patent. The MCA bifurcations are intact. ACA and MCA branch vessels are unremarkable.  Posterior circulation: The right vertebral artery is slightly dominant to the left. PICA origins are visualized and normal. The vertebrobasilar junction is within normal limits. The basilar artery is unremarkable. The right posterior cerebral artery originates from the basilar tip. The left posterior cerebral artery is of fetal type. The PCA branch vessels are within normal limits.  Venous sinuses: The dural sinuses are patent. The right transverse sinus is dominant. The straight sinus is patent. The deep cerebral veins are patent.  Anatomic variants: Fetal type left posterior cerebral artery  IMPRESSION: 1. No acute abnormality. 2. Normal CTA circle of Willis without evidence for significant proximal stenosis, aneurysm, or branch vessel occlusion. 3. Postoperative changes of the right cerebellum. These results were called by telephone at the time of interpretation on 03/21/2015 at 3:39pm to Dr. Jim Like , who verbally acknowledged  these results.   Electronically Signed   By: San Morelle M.D.   On: 03/21/2015 16:03   Ct Head Wo Contrast  03/21/2015   CLINICAL DATA:  Altered mental status.  Code stroke.  EXAM: CT HEAD WITHOUT CONTRAST  TECHNIQUE: Contiguous axial images were obtained from the base of the skull through the vertex without intravenous contrast.  COMPARISON:  MRI 02/25/2015.  FINDINGS: RIGHT suboccipital craniectomy with encephalomalacia in the RIGHT cerebellar hemisphere.  No mass lesion, mass effect, midline shift, hydrocephalus, hemorrhage. No territorial ischemia or acute infarction. Small areas of thickening are present within the posterior maxillary sinuses which may represent fluid levels which can be seen in acute sinusitis. This could also represent ordinary mucosal thickening which is partially visible. Opacification of the frontal sinuses and partial opacification of the ethmoid air cells.  IMPRESSION: 1. No acute intracranial abnormality. 2. RIGHT suboccipital craniectomy with encephalomalacia from previous RIGHT cerebellar hemisphere resection. 3. Critical Value/emergent results were called by telephone at the time of interpretation on 03/21/2015 at 2:53 pm to Dr. Jerene Canny , who verbally acknowledged these results.   Electronically Signed   By: Dereck Ligas M.D.   On: 03/21/2015 14:54     EKG Interpretation   Date/Time:  Saturday March 21 2015 14:32:35 EDT Ventricular Rate:  112 PR Interval:  132 QRS Duration: 88 QT Interval:  365 QTC Calculation: 498 R Axis:   84 Text Interpretation:  Sinus tachycardia Biatrial enlargement Borderline  repolarization abnormality Borderline prolonged QT interval Confirmed by  Betsey Holiday  MD, CHRISTOPHER [phone]) on 03/21/2015 3:40:01 PM      MDM   Final diagnoses:  None   acute encephalopathy  Patient presents to the ER for evaluation of acute alteration of her mental status. Symptoms began within the last 2-1/2 hours. Husband reports  that she was normal when she went to sleep and then woke up with this strange behavior. No true mediolateral or focal deficits identified, but patient acutely is very altered. With no other obvious explanation, code stroke was initiated. She has not, however, a candidate for TPA because of her previous brain tumor and craniectomy.  Workup has been unremarkable thus far. CT head, CT angiography are negative. Appreciate neurology consultation. Will obtain MRI. Lumbar puncture performed to rule out infectious etiology. Patient will be admitted to the hospital for further monitoring and treatment.     Orpah Greek, MD 03/21/15 [place], MD 03/21/15 [phone]

## 2015-03-21 NOTE — Consult Note (Addendum)
Consult Reason for Consult:altered mental status Referring Physician: Dr Betsey Holiday [facility] ED  CC: altered mental status  HPI: [name] [name] is an 54 y.o. female brought in for acute change in mental status. LSW 1200 when patient took a nap. Woke up and noted to be very confused, difficulty talking, not always making sense. Would have intermittent periods where she would become lucid and then would become nonverbal with eyes staring off (both to the right and to the left). No recent sick contacts, no fevers, rashes. Patient was returning home from a camping trip today. She did have diarrhea and nausea/emesis yesterday, had felt better today.   Patient has history of metastatic melanoma with brain mets for which she has had multiple negative MRI brain scans at [place] (most recent 02/25/2015). CT head imaging reviewed, no acute process, shows right suboccipital craniectomy with encephalomalacia. Labs pertinent for elevated WBC. Patient afebrile.   Past Medical History  Diagnosis Date  . TMJ (dislocation of temporomandibular joint)   . Thyroid disease   . Lung cancer     awaiting pathology  . Brain cancer     awaiting pathology; assuming this is a lung primary with brain mets  . Dementia   . Hypothyroidism   . GERD (gastroesophageal reflux disease)   . Anxiety     Past Surgical History  Procedure Laterality Date  . Tonsillectomy    . Appendectomy    . Suboccipital craniectomy cervical laminectomy N/A 11/19/2013    Procedure: SUBOCCIPITAL CRANIECTOMY for tumor;  Surgeon: Eustace Moore, MD;  Location: [place] [facility];  Service: Neurosurgery;  Laterality: N/A;  . Lumbar wound debridement N/A 12/05/2013    Procedure: irrigation and debridement posterior cervical wound;  Surgeon: Eustace Moore, MD;  Location: [place] [facility] OR[facility];  Service: Neurosurgery;  Laterality: N/A;  . Video assisted thoracoscopy (vats)/wedge resection Left 03/05/2014    Procedure: VIDEO ASSISTED THORACOSCOPY (VATS)/WEDGE RESECTION;   Surgeon: Melrose Nakayama, MD;  Location: [place];  Service: Thoracic;  Laterality: Left;    Family History  Problem Relation Age of Onset  . Dementia Mother   . Cancer - Other Other     Social History:  reports that she quit smoking about 16 months ago. She has never used smokeless tobacco. She reports that she does not drink alcohol or use illicit drugs.  Allergies  Allergen Reactions  . Morphine And Related Nausea Only  . Scopolamine Rash    Medications: I have reviewed the patient's current medications.   ROS: Out of a complete 14 system review, the patient complains of only the following symptoms, and all other reviewed systems are negative. + confusion  Physical Examination: Filed Vitals:   03/21/15 1500  BP: 146/86  Pulse: 112  Temp:   Resp: 19   Physical Exam  Constitutional: He appears well-developed and well-nourished.  Psych: Affect appropriate to situation Eyes: No scleral injection HENT: No OP obstrucion Head: Normocephalic.  Cardiovascular: Normal rate and regular rhythm.  Respiratory: Effort normal and breath sounds normal.  GI: Soft. Bowel sounds are normal. No distension. There is no tenderness.  Skin: WDI  Neurologic Examination Mental Status: Alert, oriented to name only. Able to identify her husband, intermittently able to provide a history but then becomes less responsive. Difficulty naming objects on stroke card. No dysarthria. Intermittently will follow simple commands.  Cranial Nerves: II: optic discs not visualized, decreased blink to threat on the right but will neglect left side of stroke card, pupils equal, round,  reactive to light III,IV, VI: ptosis not present, extra-ocular motions intact bilaterally, no gaze deviation or preference V,VII: smile symmetric, facial light touch sensation normal bilaterally VIII: hearing normal bilaterally IX,X: gag reflex present XI: trapezius strength/neck flexion strength normal bilaterally XII:  tongue strength normal  Motor: Not following commands but appears to move all extremities symmetrically and against light resistance Tone and bulk:normal tone throughout; no atrophy noted Sensory: withdrawals to noxious stimuli symmetrically in all extremities Deep Tendon Reflexes: 2+ and symmetric throughout Plantars: Right: downgoing   Left: downgoing Cerebellar: Unable to test Gait: deferred  Laboratory Studies:   Basic Metabolic Panel:  Recent Labs Lab 03/21/15 1512  NA 136  K 3.6  CL 98  GLUCOSE 204*  BUN 15  CREATININE 1.10    Liver Function Tests: No results for input(s): AST, ALT, ALKPHOS, BILITOT, PROT, ALBUMIN in the last 168 hours. No results for input(s): LIPASE, AMYLASE in the last 168 hours. No results for input(s): AMMONIA in the last 168 hours.  CBC:  Recent Labs Lab 03/21/15 1456 03/21/15 1512  WBC 12.6*  --   NEUTROABS 9.7*  --   HGB 13.8 15.6*  HCT 41.1 46.0  MCV 90.1  --   PLT 280  --     Cardiac Enzymes: No results for input(s): CKTOTAL, CKMB, CKMBINDEX, TROPONINI in the last 168 hours.  BNP: Invalid input(s): POCBNP  CBG: No results for input(s): GLUCAP in the last 168 hours.  Microbiology: Results for orders placed or performed during the hospital encounter of 04/29/14  Culture, blood (routine x 2)     Status: None   Collection Time: 04/30/14  1:40 AM  Result Value Ref Range Status   Specimen Description BLOOD RIGHT HAND  Final   Special Requests BOTTLES DRAWN AEROBIC ONLY [facility]  Final   Culture  Setup Time   Final    04/30/2014 03:58 Performed at [place]   Final    NO GROWTH 5 DAYS Performed at [facility]   Report Status 05/06/2014 FINAL  Final  Culture, blood (routine x 2)     Status: None   Collection Time: 04/30/14  1:47 AM  Result Value Ref Range Status   Specimen Description BLOOD LEFT HAND  Final   Special Requests BOTTLES DRAWN AEROBIC ONLY [facility]  Final   Culture  Setup Time   Final     04/30/2014 03:58 Performed at [place]   Final    NO GROWTH 5 DAYS Performed at [facility]   Report Status 05/06/2014 FINAL  Final    Coagulation Studies:  Recent Labs  03/21/15 1456  LABPROT 15.4*  INR 1.20    Urinalysis: No results for input(s): COLORURINE, LABSPEC, PHURINE, GLUCOSEU, HGBUR, BILIRUBINUR, KETONESUR, PROTEINUR, UROBILINOGEN, NITRITE, LEUKOCYTESUR in the last 168 hours.  Invalid input(s): APPERANCEUR  Lipid Panel:  No results found for: CHOL, TRIG, HDL, CHOLHDL, VLDL, LDLCALC  HgbA1C:  Lab Results  Component Value Date   HGBA1C 5.5 04/30/2014    Urine Drug Screen:  No results found for: LABOPIA, COCAINSCRNUR, LABBENZ, AMPHETMU, THCU, LABBARB  Alcohol Level: No results for input(s): ETH in the last 168 hours.  Other results:  Imaging: Ct Head Wo Contrast  03/21/2015   CLINICAL DATA:  Altered mental status.  Code stroke.  EXAM: CT HEAD WITHOUT CONTRAST  TECHNIQUE: Contiguous axial images were obtained from the base of the skull through the vertex without intravenous contrast.  COMPARISON:  MRI 02/25/2015.  FINDINGS: RIGHT suboccipital craniectomy with encephalomalacia in the RIGHT cerebellar hemisphere.  No mass lesion, mass effect, midline shift, hydrocephalus, hemorrhage. No territorial ischemia or acute infarction. Small areas of thickening are present within the posterior maxillary sinuses which may represent fluid levels which can be seen in acute sinusitis. This could also represent ordinary mucosal thickening which is partially visible. Opacification of the frontal sinuses and partial opacification of the ethmoid air cells.  IMPRESSION: 1. No acute intracranial abnormality. 2. RIGHT suboccipital craniectomy with encephalomalacia from previous RIGHT cerebellar hemisphere resection. 3. Critical Value/emergent results were called by telephone at the time of interpretation on 03/21/2015 at 2:53 pm to Dr. Jerene Canny  , who verbally acknowledged these results.   Electronically Signed   By: Dereck Ligas M.D.   On: 03/21/2015 14:54     Assessment/Plan:  54y/o woman with history of metastatic melanoma with brain mets s/p resection presenting for acute onset of altered mental status after a recent camping trip. CT head and angiogram unremarkable. Unclear etiology. Would consider seizure vs infectious vs metabolic vs less likely CVA. Recent negative MRI brain makes recurrence of metastatic disease less likely.   -LP in ED. To check for cell count, Gram stain, culture, protein, glucose, HSV, enterovirus, WNV, Lyme -check MRI brain -EEG -CMP, B12, TSH, Ammonia, UDS, RMSF, Lyme -will follow   Kymoni Lesperance, DO [facility] [phone]  If 7pm- 7am, please page neurology on call as listed in AMION. 03/21/2015, 3:48 PM

## 2015-03-21 NOTE — Progress Notes (Signed)
Attempted report x1. 

## 2015-03-21 NOTE — ED Notes (Signed)
Pt alert, intermittently follows commands and responds with appropriate comments.  Pt is then mute and eyes stare in varying directions.  Pt postures head, neck, forearms/hands in different positions to both sides.

## 2015-03-21 NOTE — H&P (Addendum)
[facility] History and Physical  [name] [name] ZYS:[id number] DOB: Sep 13, 1961 DOA: 03/21/2015  Referring physician: ED physician PCP: Orpah Melter, MD  Specialists:   Chief Complaint: AMS  HPI: [name] [name] is a 54 y.o. female with past medical history of GERD, hypothyroidism, anxiety, melanoma, brain metastases of melanoma (post status of craniotomy), TMJ, who presents with altered mental status.  Patient reports that after she had a nap she woke up with confusion. Per his husband, pt had difficulty talking, not always making sense. She had intermittent periods where she would become lucid and then would become nonverbal with eyes staring off, both to the right and to the left. She states that she had camping for two days and was returning home from her trip today. She has chronic nausea and vomiting. She had diarrhea yesterday, but no symptoms today. Of note, patient has multiple sedative medications on her list, including compazine, Phenergan, Reglan, Ativan, Zofran and tramadol. She said she has not taken Phenergan recently, but taking all other medications most likely.  ROS: currently patient denies fever, chills, running nose, ear pain, headaches, cough, chest pain, SOB, abdominal pain, diarrhea, constipation, dysuria, urgency, frequency, hematuria, skin rashes, joint pain or leg swelling. No unilateral weakness, numbness or tingling sensations. No vision change or hearing loss.  In ED, patient was found to have negative CT-head and CTA head for acute abnormalities. Lipase is 12.6, temperature 97.6, heart rate 127, electrolytes okay. UDS is positive for TSH and benzo. INR 1.20, negative troponin, negative alcohol level,. Neurology was consulted by ED, lumbar puncture were performed per Dr. Marin Comment. The initial CSF analysis is negative.  Review of Systems: As presented in the history of presenting illness, rest negative.  Where does patient live?  At home Can patient  participate in ADLs? Yes  Allergy:  Allergies  Allergen Reactions  . Morphine And Related Nausea Only  . Scopolamine Rash    Past Medical History  Diagnosis Date  . TMJ (dislocation of temporomandibular joint)   . Thyroid disease   . Lung cancer     awaiting pathology  . Brain cancer     awaiting pathology; assuming this is a lung primary with brain mets  . Dementia   . Hypothyroidism   . GERD (gastroesophageal reflux disease)   . Anxiety     Past Surgical History  Procedure Laterality Date  . Tonsillectomy    . Appendectomy    . Suboccipital craniectomy cervical laminectomy N/A 11/19/2013    Procedure: SUBOCCIPITAL CRANIECTOMY for tumor;  Surgeon: Eustace Moore, MD;  Location: [facility];  Service: Neurosurgery;  Laterality: N/A;  . Lumbar wound debridement N/A 12/05/2013    Procedure: irrigation and debridement posterior cervical wound;  Surgeon: Eustace Moore, MD;  Location: [facility];  Service: Neurosurgery;  Laterality: N/A;  . Video assisted thoracoscopy (vats)/wedge resection Left 03/05/2014    Procedure: VIDEO ASSISTED THORACOSCOPY (VATS)/WEDGE RESECTION;  Surgeon: Melrose Nakayama, MD;  Location: [facility];  Service: Thoracic;  Laterality: Left;    Social History:  reports that she quit smoking about 16 months ago. She has never used smokeless tobacco. She reports that she does not drink alcohol or use illicit drugs.  Family History:  Family History  Problem Relation Age of Onset  . Dementia Mother   . Cancer - Other Other      Prior to Admission medications   Medication Sig Start Date End Date Taking? Authorizing Provider  citalopram (CELEXA) 20 MG  tablet Take 20 mg by mouth every morning.  07/14/14 07/14/15 Yes Historical Provider, MD  esomeprazole (NEXIUM) 20 MG capsule Take 20 mg by mouth daily at 12 noon.   Yes Historical Provider, MD  hydrocortisone 2.5 % cream Apply 1 application topically as needed (for rash., itching).   Yes Historical Provider, MD   levothyroxine (SYNTHROID, LEVOTHROID) 100 MCG tablet Take 100 mcg by mouth daily before breakfast.   Yes Historical Provider, MD  LORazepam (ATIVAN) 1 MG tablet Take 1 tablet (1 mg total) by mouth every 12 (twelve) hours as needed for anxiety or sleep. Patient taking differently: Take 1 mg by mouth at bedtime.  05/03/14  Yes Robbie Lis, MD  metoCLOPramide (REGLAN) 10 MG tablet Take 10 mg by mouth 3 (three) times daily as needed for nausea or vomiting.   Yes Historical Provider, MD  ondansetron (ZOFRAN) 4 MG tablet Take 1 tablet (4 mg total) by mouth every 6 (six) hours as needed for nausea. 05/03/14  Yes Robbie Lis, MD  piroxicam (FELDENE) 20 MG capsule Take 20 mg by mouth daily.   Yes Historical Provider, MD  prochlorperazine (COMPAZINE) 10 MG tablet Take 10 mg by mouth every 6 (six) hours as needed for nausea or vomiting.   Yes Historical Provider, MD  traMADol (ULTRAM) 50 MG tablet Take 1 tablet (50 mg total) by mouth every 12 (twelve) hours as needed for moderate pain. 12/01/14  Yes Tyler Pita, MD  potassium chloride 20 MEQ TBCR Take 10 mEq by mouth daily. Patient not taking: Reported on 12/01/2014 05/03/14   Robbie Lis, MD  promethazine (PHENERGAN) 25 MG suppository Place 1 suppository (25 mg total) rectally every 6 (six) hours as needed for nausea, vomiting or refractory nausea / vomiting. Patient not taking: Reported on 12/01/2014 05/03/14   Robbie Lis, MD    Physical Exam: Filed Vitals:   03/21/15 2000 03/21/15 2015 03/21/15 2246 03/22/15 0207  BP: 137/93 139/84 140/93 121/75  Pulse: 108 110 118 119  Temp:   98 F (36.7 C) 98.3 F (36.8 C)  TempSrc:   Oral Oral  Resp: '17 19 18 18  '$ Height:   '5\' 7"'$  (1.702 m)   Weight:   42 kg (92 lb 9.5 oz)   SpO2: 100% 97% 98% 97%   General: Not in acute distress HEENT:       Eyes: PERRL, EOMI, no scleral icterus       ENT: No discharge from the ears and nose, no pharynx injection, no tonsillar enlargement.        Neck: No JVD, no bruit,  no mass felt. Cardiac: S1/S2, RRR, No murmurs, No gallops or rubs Pulm: Good air movement bilaterally. Clear to auscultation bilaterally. No rales, wheezing, rhonchi or rubs. Abd: Soft, nondistended, nontender, no rebound pain, no organomegaly, BS present Ext: No edema bilaterally. 2+DP/PT pulse bilaterally Musculoskeletal: No joint deformities, erythema, or stiffness, ROM full Skin: No rashes.  Neuro: drowsy, but oriented X3, cranial nerves II-XII grossly intact, muscle strength 5/5 in all extremeties, sensation to light touch intact. Brachial reflex 2+ bilaterally. Knee reflex 1+ bilaterally. Negative Babinski's sign. Normal finger to nose test. Psych: Patient is not psychotic, no suicidal or hemocidal ideation.  Labs on Admission:  Basic Metabolic Panel:  Recent Labs Lab 03/21/15 1456 03/21/15 1512  NA 135 136  K 3.5 3.6  CL 98 98  CO2 25  --   GLUCOSE 204* 204*  BUN 13 15  CREATININE 1.18* 1.10  CALCIUM  10.9*  --    Liver Function Tests:  Recent Labs Lab 03/21/15 1456  AST 31  ALT 19  ALKPHOS 75  BILITOT 1.0  PROT 7.1  ALBUMIN 4.2   No results for input(s): LIPASE, AMYLASE in the last 168 hours.  Recent Labs Lab 03/21/15 1706  AMMONIA 77*   CBC:  Recent Labs Lab 03/21/15 1456 03/21/15 1512  WBC 12.6*  --   NEUTROABS 9.7*  --   HGB 13.8 15.6*  HCT 41.1 46.0  MCV 90.1  --   PLT 280  --    Cardiac Enzymes: No results for input(s): CKTOTAL, CKMB, CKMBINDEX, TROPONINI in the last 168 hours.  BNP (last 3 results) No results for input(s): BNP in the last 8760 hours.  ProBNP (last 3 results) No results for input(s): PROBNP in the last 8760 hours.  CBG: No results for input(s): GLUCAP in the last 168 hours.  Radiological Exams on Admission: Ct Angio Head W/cm &/or Wo Cm  03/21/2015   CLINICAL DATA:  Altered mental status. Woke up from nap CT at chronic. Code stroke.  EXAM: CT ANGIOGRAPHY HEAD  TECHNIQUE: Multidetector CT imaging of the head was  performed using the standard protocol during bolus administration of intravenous contrast. Multiplanar CT image reconstructions and MIPs were obtained to evaluate the vascular anatomy.  CONTRAST:  39m OMNIPAQUE IOHEXOL 350 MG/ML SOLN  COMPARISON:  CT head without contrast from the same day. MRI brain 02/25/2015.  FINDINGS: CT HEAD  Brain: Postsurgical changes of the right cerebellum are again noted. No acute cortical infarct, hemorrhage, or mass lesion is present. There is no pathologic enhancement. The basal ganglia are intact. Insular cortex is intact.  Calvarium and skull base: Right occipital craniotomy is noted. The calvarium is otherwise intact.  Paranasal sinuses: Mild mucosal thickening is present in the anterior ethmoid air cells bilaterally and a left agger nasi cell  Orbits: Within normal limits  CTA HEAD  Anterior circulation: Minimal atherosclerotic calcifications are present within the cavernous internal carotid arteries. There are no significant stenoses. The ICA termini are normal bilaterally.  Anterior communicating artery is patent. The MCA bifurcations are intact. ACA and MCA branch vessels are unremarkable.  Posterior circulation: The right vertebral artery is slightly dominant to the left. PICA origins are visualized and normal. The vertebrobasilar junction is within normal limits. The basilar artery is unremarkable. The right posterior cerebral artery originates from the basilar tip. The left posterior cerebral artery is of fetal type. The PCA branch vessels are within normal limits.  Venous sinuses: The dural sinuses are patent. The right transverse sinus is dominant. The straight sinus is patent. The deep cerebral veins are patent.  Anatomic variants: Fetal type left posterior cerebral artery  IMPRESSION: 1. No acute abnormality. 2. Normal CTA circle of Willis without evidence for significant proximal stenosis, aneurysm, or branch vessel occlusion. 3. Postoperative changes of the right  cerebellum. These results were called by telephone at the time of interpretation on 03/21/2015 at 3:39pm to Dr. PJim Like, who verbally acknowledged these results.   Electronically Signed   By: CSan MorelleM.D.   On: 03/21/2015 16:03   Ct Head Wo Contrast  03/21/2015   CLINICAL DATA:  Altered mental status.  Code stroke.  EXAM: CT HEAD WITHOUT CONTRAST  TECHNIQUE: Contiguous axial images were obtained from the base of the skull through the vertex without intravenous contrast.  COMPARISON:  MRI 02/25/2015.  FINDINGS: RIGHT suboccipital craniectomy with encephalomalacia in the RIGHT cerebellar  hemisphere.  No mass lesion, mass effect, midline shift, hydrocephalus, hemorrhage. No territorial ischemia or acute infarction. Small areas of thickening are present within the posterior maxillary sinuses which may represent fluid levels which can be seen in acute sinusitis. This could also represent ordinary mucosal thickening which is partially visible. Opacification of the frontal sinuses and partial opacification of the ethmoid air cells.  IMPRESSION: 1. No acute intracranial abnormality. 2. RIGHT suboccipital craniectomy with encephalomalacia from previous RIGHT cerebellar hemisphere resection. 3. Critical Value/emergent results were called by telephone at the time of interpretation on 03/21/2015 at 2:53 pm to Dr. Jerene Canny , who verbally acknowledged these results.   Electronically Signed   By: Dereck Ligas M.D.   On: 03/21/2015 14:54   Mr Jeri Cos ER Contrast  03/21/2015   CLINICAL DATA:  Initial evaluation for acute altered mental status, confusion. History of metastatic melanoma. Most recent brain MRIs have been negative for metastatic disease.  EXAM: MRI HEAD WITHOUT AND WITH CONTRAST  TECHNIQUE: Multiplanar, multiecho pulse sequences of the brain and surrounding structures were obtained without and with intravenous contrast.  CONTRAST:  27m MULTIHANCE GADOBENATE DIMEGLUMINE 529  MG/ML IV SOLN  COMPARISON:  Prior CT from earlier the same day as well as previous brain MRI from 02/25/2015.  FINDINGS: Postoperative changes from prior right suboccipital craniotomy again seen. There is encephalomalacia within the right cerebellar hemisphere, similar to previous. No mass lesion or abnormal enhancement identified to suggest intracranial metastasis.  There is abnormal T2/FLAIR signal intensity with edema involving predominantly the cortical gray matter of the bilateral temporal occipital regions, right greater than left. There is involvement of the mesial right temporal lobe, although there is suggestion of probable increased signal intensity within the mesial left temporal lobe as well. The right hippocampus appears swollen with increased T2/FLAIR signal intensity. Right fornix is involved. No associated abnormal enhancement on post-contrast sequences.  No mass lesion or midline shift. No hydrocephalus. No extra-axial fluid collection.  Craniocervical junction within normal limits. Pituitary gland normal. No acute abnormality about the orbits.  Small amount layering opacity present within the left maxillary sinus. There is scattered mucosal thickening within the ethmoidal air cells. Frontal sinus partially opacified. Scattered fluid signal intensity present within the mastoid air cells bilaterally. Inner ear structures not well evaluated on this exam.  No acute large vessel territory infarct. No intracranial hemorrhage. Gray-white matter differentiation maintained. Normal intravascular flow voids preserved.  Mild degenerative changes noted within the upper cervical spine. Bone marrow signal intensity normal. Scalp soft tissues within normal limits.  IMPRESSION: 1. Abnormal T2/FLAIR signal intensity with edema involving predominantly the cortical gray matter of the bilateral temporal occipital regions, right greater than left, with prominent involvement of the mesial right temporal lobe and  hippocampus, although there is more mild abnormal signal intensity within the mesial left temporal lobe as well. No associated enhancement. Findings are of uncertain etiology. Differential considerations include possible postictal changes, PRES, or possibly limbic encephalitis. Possible infectious etiology such as herpes encephalitis could also be considered. Correlation with CSF may be helpful for further evaluation. 2. Postoperative changes from prior right suboccipital craniotomy with right cerebellar encephalomalacia. No intracranial mass lesion or abnormal enhancement to suggest metastatic disease identified. 3. No other acute intracranial process identified.   Electronically Signed   By: BJeannine BogaM.D.   On: 03/21/2015 22:24    EKG: Independently reviewed.  Abnormal findings: Bilateral atrial enlargement, tachycardia, QTC interval 498.  Assessment/Plan Principal Problem:  Acute encephalopathy Active Problems:   Hypothyroid   Lung nodule   S/P craniotomy   Solitary brain metastasis from melanoma   Protein-calorie malnutrition, severe   Metastatic melanoma to lung   GERD (gastroesophageal reflux disease)   Anxiety   Dementia   Polypharmacy  Acute encephalopathy: Etiology is not clear. CT head and angiogram unremarkable. Neurology was consulted, Dr. Janann Colonel evaluated the patient, who considered seizure vs infectious vs metabolic vs less likely CVA. Recent negative MRI brain makes recurrence of metastatic disease less likely per dr. Janann Colonel. Since pt has multiple sedative medications on her list, I think that polypharmacy is a possible etiology. Currently patient's mental status improved significantly per her husband.  -will admit to tele bed -Appreciate Dr. Hazle Quant consultation, will follow-up recommendations as follows:  -LP in ED. To check for cell count, Gram stain, culture, protein, glucose, HSV, enterovirus, WNV, Lyme -check MRI brain -EEG -CMP, B12, TSH, Ammonia, UDS,  RMSF, Lyme -I will hold her compazine, Phenergan, Reglan, Zofran. Will decrease her ativan from '1mg'$  to 0.5 mg QHS. -Get blood culture given WBC=12.6  Anxiety: Stable. No suicidal or homicidal ideations. -Continue Celexa  Hypothyroidism: TSH is 7.001 on 03/21/59. She reports that her Synthroid dosage was recently increased from 75-100 g just a few days ago. -Continue Synthroid.  GERD: -Protonix  History of melanoma, post status of craniotomy secondary to melanoma metastasized to brain: No acute issues. -Follow-up with PCP   DVT ppx: SQ Heparin     Code Status: Full code Family Communication:  Yes, patient's   husband   at bed side Disposition Plan: Admit to inpatient   Date of Service 03/22/2015    [name] [name] [facility] Pager [phone]  If 7PM-7AM, please contact night-coverage [email or web address] Password [facility] 03/22/2015, 6:15 AM

## 2015-03-21 NOTE — ED Notes (Signed)
Pt from home via [facility] with c/o AMS, LSW at approx noon prior to taking a nap.  Pt appears in a daze, contracted, speech is clear, no deficits noted by EMS.  PT reports stomach pain.  Hx of chronic abdominal pain, UTI, brain cancer in remission.  Pt in NAD.

## 2015-03-21 NOTE — ED Notes (Signed)
Activated Code Stroke @ 14:36p

## 2015-03-22 DIAGNOSIS — K219 Gastro-esophageal reflux disease without esophagitis: Secondary | ICD-10-CM

## 2015-03-22 DIAGNOSIS — G9340 Encephalopathy, unspecified: Principal | ICD-10-CM

## 2015-03-22 LAB — BASIC METABOLIC PANEL
Anion gap: 11 (ref 5–15)
BUN: 11 mg/dL (ref 6–23)
CALCIUM: 9.6 mg/dL (ref 8.4–10.5)
CHLORIDE: 101 mmol/L (ref 96–112)
CO2: 22 mmol/L (ref 19–32)
CREATININE: 0.93 mg/dL (ref 0.50–1.10)
GFR, EST AFRICAN AMERICAN: 79 mL/min — AB (ref >90)
GFR, EST NON AFRICAN AMERICAN: 68 mL/min — AB (ref >90)
GLUCOSE: 131 mg/dL — AB (ref 70–99)
Potassium: 3.8 mmol/L (ref 3.5–5.1)
Sodium: 134 mmol/L — ABNORMAL LOW (ref 135–145)

## 2015-03-22 LAB — HEPATIC FUNCTION PANEL
ALBUMIN: 3.2 g/dL — AB (ref 3.5–5.2)
ALT: 12 U/L (ref 0–35)
AST: 20 U/L (ref 0–37)
Alkaline Phosphatase: 56 U/L (ref 39–117)
Bilirubin, Direct: 0.1 mg/dL (ref 0.0–0.5)
Indirect Bilirubin: 1.2 mg/dL — ABNORMAL HIGH (ref 0.3–0.9)
Total Bilirubin: 1.3 mg/dL — ABNORMAL HIGH (ref 0.3–1.2)
Total Protein: 5.5 g/dL — ABNORMAL LOW (ref 6.0–8.3)

## 2015-03-22 LAB — HEPATITIS PANEL, ACUTE
HCV AB: NEGATIVE
HEP A IGM: NONREACTIVE
HEP B S AG: NEGATIVE
Hep B C IgM: NONREACTIVE

## 2015-03-22 LAB — CBC
HEMATOCRIT: 33.8 % — AB (ref 36.0–46.0)
HEMOGLOBIN: 11.3 g/dL — AB (ref 12.0–15.0)
MCH: 29.9 pg (ref 26.0–34.0)
MCHC: 33.4 g/dL (ref 30.0–36.0)
MCV: 89.4 fL (ref 78.0–100.0)
Platelets: 258 10*3/uL (ref 150–400)
RBC: 3.78 MIL/uL — ABNORMAL LOW (ref 3.87–5.11)
RDW: 13.5 % (ref 11.5–15.5)
WBC: 10 10*3/uL (ref 4.0–10.5)

## 2015-03-22 LAB — AMMONIA: Ammonia: 32 umol/L (ref 11–32)

## 2015-03-22 LAB — VITAMIN B12: Vitamin B-12: 1407 pg/mL — ABNORMAL HIGH (ref 211–911)

## 2015-03-22 MED ORDER — METOCLOPRAMIDE HCL 5 MG/ML IJ SOLN
10.0000 mg | Freq: Once | INTRAMUSCULAR | Status: AC
  Administered 2015-03-22: 10 mg via INTRAVENOUS
  Filled 2015-03-22: qty 2

## 2015-03-22 MED ORDER — TRAMADOL HCL 50 MG PO TABS
50.0000 mg | ORAL_TABLET | Freq: Four times a day (QID) | ORAL | Status: DC | PRN
  Administered 2015-03-22 – 2015-03-25 (×9): 50 mg via ORAL
  Filled 2015-03-22 (×10): qty 1

## 2015-03-22 MED ORDER — DIPHENHYDRAMINE HCL 50 MG/ML IJ SOLN
25.0000 mg | Freq: Once | INTRAMUSCULAR | Status: AC
  Administered 2015-03-22: 25 mg via INTRAVENOUS
  Filled 2015-03-22: qty 1

## 2015-03-22 MED ORDER — KETOROLAC TROMETHAMINE 30 MG/ML IJ SOLN
30.0000 mg | Freq: Once | INTRAMUSCULAR | Status: AC
Start: 2015-03-22 — End: 2015-03-22
  Administered 2015-03-22: 30 mg via INTRAVENOUS
  Filled 2015-03-22: qty 1

## 2015-03-22 NOTE — Progress Notes (Signed)
PATIENT DETAILS Name: [name] [name] Age: 54 y.o. Sex: female Date of Birth: March 13, 1961 Admit Date: 03/21/2015 Admitting Physician Ivor Costa, MD [id number], Annie Main, MD  Subjective: Complains of headache-mostly behind her right eye. Seems to be much more awake and alert than on admission.  Assessment/Plan: Principal Problem:   Acute encephalopathy: Etiology unknown, MRI of the brain shows abnormal T2/FLAIR signal intensity with edema involving predominantly the cortical gray matter of the bilateral temporal occipital regions. Underwent lumbar puncture-CSF fairly benign-but with elevated protein levels. Etiology remains unknown, neurology recommending to continue with IV acyclovir till HSV PCR comes back. Personally spoke with Dr. Fuller Mandril doubts PRES. Awaiting EEG, given history of metastatic melanoma-paraneoplastic syndrome always a consideration-neurology has ordered numerous serological tests. Follow clinical course.  Active Problems: Anxiety: Stable. No suicidal or homicidal ideations.Continue Celexa  Hypothyroidism: TSH mildly elevated, Synthroid was recently increased to 100 g. Continue. We will need to repeat TSH in the next 3 months.  GERD: Continue PPI  History of melanoma, post status of craniotomy secondary to melanoma metastasized to brain: Follows with oncology at [facility], gets radiation oncology treatments at the cancer center at [facility]. MRI brain without any suggestion of recurrence of metastatic disease.  Disposition: Remain inpatient  Antimicrobial agents  See below  Anti-infectives    Start     Dose/Rate Route Frequency Ordered Stop   03/21/15 2300  acyclovir (ZOVIRAX) 450 mg in dextrose 5 % 100 mL IVPB     450 mg 109 mL/hr over 60 Minutes Intravenous Every 12 hours 03/21/15 2258        DVT Prophylaxis: Prophylactic Heparin   Code Status: Full code  Family Communication Spouse at bedside b  Procedures: Lumbar puncture  4/23  CONSULTS:  neurology   MEDICATIONS: Scheduled Meds: . acyclovir  450 mg Intravenous Q12H  . citalopram  20 mg Oral q morning - 10a  . heparin  5,000 Units Subcutaneous 3 times per day  . levothyroxine  100 mcg Oral QAC breakfast  . pantoprazole  40 mg Oral Daily  . piroxicam  20 mg Oral Daily  . sodium chloride  3 mL Intravenous Q12H   Continuous Infusions: . sodium chloride 100 mL/hr at 03/21/15 2316   PRN Meds:.alum & mag hydroxide-simeth, hydrocortisone cream, LORazepam, traMADol    PHYSICAL EXAM: Vital signs in last 24 hours: Filed Vitals:   03/22/15 0207 03/22/15 0625 03/22/15 1012 03/22/15 1421  BP: 121/75 144/80 117/68 129/71  Pulse: 119 107 115 116  Temp: 98.3 F (36.8 C) 98.5 F (36.9 C) 99.4 F (37.4 C) 99.7 F (37.6 C)  TempSrc: Oral Oral Oral Oral  Resp: '18 18 18 18  '$ Height:      Weight:      SpO2: 97% 100% 99% 98%    Weight change:  Filed Weights   03/21/15 2246  Weight: 42 kg (92 lb 9.5 oz)   Body mass index is 14.5 kg/(m^2).   Gen Exam: Awake and alert with clear speech.   Neck: Supple, No JVD.   Chest: B/L Clear.   CVS: S1 S2 Regular, no murmurs.  Abdomen: soft, BS +, non tender, non distended.  Extremities: no edema, lower extremities warm to touch. Neurologic: Non Focal.   Skin: No Rash.   Wounds: N/A.    Intake/Output from previous day:  Intake/Output Summary (Last 24 hours) at 03/22/15 1440 Last data filed at 03/22/15 0829  Gross per  24 hour  Intake    260 ml  Output      0 ml  Net    260 ml     LAB RESULTS: CBC  Recent Labs Lab 03/21/15 1456 03/21/15 1512 03/22/15 0815  WBC 12.6*  --  10.0  HGB 13.8 15.6* 11.3*  HCT 41.1 46.0 33.8*  PLT 280  --  258  MCV 90.1  --  89.4  MCH 30.3  --  29.9  MCHC 33.6  --  33.4  RDW 13.4  --  13.5  LYMPHSABS 1.6  --   --   MONOABS 1.1*  --   --   EOSABS 0.2  --   --   BASOSABS 0.0  --   --     Chemistries   Recent Labs Lab 03/21/15 1456 03/21/15 1512  03/22/15 0815  NA 135 136 134*  K 3.5 3.6 3.8  CL 98 98 101  CO2 25  --  22  GLUCOSE 204* 204* 131*  BUN '13 15 11  '$ CREATININE 1.18* 1.10 0.93  CALCIUM 10.9*  --  9.6    CBG: No results for input(s): GLUCAP in the last 168 hours.  GFR Estimated Creatinine Clearance: 45.9 mL/min (by C-G formula based on Cr of 0.93).  Coagulation profile  Recent Labs Lab 03/21/15 1456  INR 1.20    Cardiac Enzymes No results for input(s): CKMB, TROPONINI, MYOGLOBIN in the last 168 hours.  Invalid input(s): CK  Invalid input(s): POCBNP No results for input(s): DDIMER in the last 72 hours. No results for input(s): HGBA1C in the last 72 hours. No results for input(s): CHOL, HDL, LDLCALC, TRIG, CHOLHDL, LDLDIRECT in the last 72 hours.  Recent Labs  03/21/15 1706  TSH 7.001*    Recent Labs  03/21/15 1706  VITAMINB12 1407*   No results for input(s): LIPASE, AMYLASE in the last 72 hours.  Urine Studies No results for input(s): UHGB, CRYS in the last 72 hours.  Invalid input(s): UACOL, UAPR, USPG, UPH, UTP, UGL, UKET, UBIL, UNIT, UROB, ULEU, UEPI, UWBC, URBC, UBAC, CAST, UCOM, BILUA  MICROBIOLOGY: Recent Results (from the past 240 hour(s))  Gram stain     Status: None   Collection Time: 03/21/15  4:33 PM  Result Value Ref Range Status   Specimen Description CSF  Final   Special Requests TUBE 2  Final   Gram Stain NO WBC SEEN NO ORGANISMS SEEN   Final   Report Status 03/21/2015 FINAL  Final    RADIOLOGY STUDIES/RESULTS: Ct Angio Head W/cm &/or Wo Cm  03/21/2015   CLINICAL DATA:  Altered mental status. Woke up from nap CT at chronic. Code stroke.  EXAM: CT ANGIOGRAPHY HEAD  TECHNIQUE: Multidetector CT imaging of the head was performed using the standard protocol during bolus administration of intravenous contrast. Multiplanar CT image reconstructions and MIPs were obtained to evaluate the vascular anatomy.  CONTRAST:  80m OMNIPAQUE IOHEXOL 350 MG/ML SOLN  COMPARISON:  CT head  without contrast from the same day. MRI brain 02/25/2015.  FINDINGS: CT HEAD  Brain: Postsurgical changes of the right cerebellum are again noted. No acute cortical infarct, hemorrhage, or mass lesion is present. There is no pathologic enhancement. The basal ganglia are intact. Insular cortex is intact.  Calvarium and skull base: Right occipital craniotomy is noted. The calvarium is otherwise intact.  Paranasal sinuses: Mild mucosal thickening is present in the anterior ethmoid air cells bilaterally and a left agger nasi cell  Orbits: Within normal limits  CTA HEAD  Anterior circulation: Minimal atherosclerotic calcifications are present within the cavernous internal carotid arteries. There are no significant stenoses. The ICA termini are normal bilaterally.  Anterior communicating artery is patent. The MCA bifurcations are intact. ACA and MCA branch vessels are unremarkable.  Posterior circulation: The right vertebral artery is slightly dominant to the left. PICA origins are visualized and normal. The vertebrobasilar junction is within normal limits. The basilar artery is unremarkable. The right posterior cerebral artery originates from the basilar tip. The left posterior cerebral artery is of fetal type. The PCA branch vessels are within normal limits.  Venous sinuses: The dural sinuses are patent. The right transverse sinus is dominant. The straight sinus is patent. The deep cerebral veins are patent.  Anatomic variants: Fetal type left posterior cerebral artery  IMPRESSION: 1. No acute abnormality. 2. Normal CTA circle of Willis without evidence for significant proximal stenosis, aneurysm, or branch vessel occlusion. 3. Postoperative changes of the right cerebellum. These results were called by telephone at the time of interpretation on 03/21/2015 at 3:39pm to Dr. Jim Like , who verbally acknowledged these results.   Electronically Signed   By: San Morelle M.D.   On: 03/21/2015 16:03   Ct Head Wo  Contrast  03/21/2015   CLINICAL DATA:  Altered mental status.  Code stroke.  EXAM: CT HEAD WITHOUT CONTRAST  TECHNIQUE: Contiguous axial images were obtained from the base of the skull through the vertex without intravenous contrast.  COMPARISON:  MRI 02/25/2015.  FINDINGS: RIGHT suboccipital craniectomy with encephalomalacia in the RIGHT cerebellar hemisphere.  No mass lesion, mass effect, midline shift, hydrocephalus, hemorrhage. No territorial ischemia or acute infarction. Small areas of thickening are present within the posterior maxillary sinuses which may represent fluid levels which can be seen in acute sinusitis. This could also represent ordinary mucosal thickening which is partially visible. Opacification of the frontal sinuses and partial opacification of the ethmoid air cells.  IMPRESSION: 1. No acute intracranial abnormality. 2. RIGHT suboccipital craniectomy with encephalomalacia from previous RIGHT cerebellar hemisphere resection. 3. Critical Value/emergent results were called by telephone at the time of interpretation on 03/21/2015 at 2:53 pm to Dr. Jerene Canny , who verbally acknowledged these results.   Electronically Signed   By: Dereck Ligas M.D.   On: 03/21/2015 14:54   Mr Jeri Cos DD Contrast  03/21/2015   CLINICAL DATA:  Initial evaluation for acute altered mental status, confusion. History of metastatic melanoma. Most recent brain MRIs have been negative for metastatic disease.  EXAM: MRI HEAD WITHOUT AND WITH CONTRAST  TECHNIQUE: Multiplanar, multiecho pulse sequences of the brain and surrounding structures were obtained without and with intravenous contrast.  CONTRAST:  76m MULTIHANCE GADOBENATE DIMEGLUMINE 529 MG/ML IV SOLN  COMPARISON:  Prior CT from earlier the same day as well as previous brain MRI from 02/25/2015.  FINDINGS: Postoperative changes from prior right suboccipital craniotomy again seen. There is encephalomalacia within the right cerebellar hemisphere,  similar to previous. No mass lesion or abnormal enhancement identified to suggest intracranial metastasis.  There is abnormal T2/FLAIR signal intensity with edema involving predominantly the cortical gray matter of the bilateral temporal occipital regions, right greater than left. There is involvement of the mesial right temporal lobe, although there is suggestion of probable increased signal intensity within the mesial left temporal lobe as well. The right hippocampus appears swollen with increased T2/FLAIR signal intensity. Right fornix is involved. No associated abnormal enhancement on post-contrast sequences.  No mass lesion or  midline shift. No hydrocephalus. No extra-axial fluid collection.  Craniocervical junction within normal limits. Pituitary gland normal. No acute abnormality about the orbits.  Small amount layering opacity present within the left maxillary sinus. There is scattered mucosal thickening within the ethmoidal air cells. Frontal sinus partially opacified. Scattered fluid signal intensity present within the mastoid air cells bilaterally. Inner ear structures not well evaluated on this exam.  No acute large vessel territory infarct. No intracranial hemorrhage. Gray-white matter differentiation maintained. Normal intravascular flow voids preserved.  Mild degenerative changes noted within the upper cervical spine. Bone marrow signal intensity normal. Scalp soft tissues within normal limits.  IMPRESSION: 1. Abnormal T2/FLAIR signal intensity with edema involving predominantly the cortical gray matter of the bilateral temporal occipital regions, right greater than left, with prominent involvement of the mesial right temporal lobe and hippocampus, although there is more mild abnormal signal intensity within the mesial left temporal lobe as well. No associated enhancement. Findings are of uncertain etiology. Differential considerations include possible postictal changes, PRES, or possibly limbic  encephalitis. Possible infectious etiology such as herpes encephalitis could also be considered. Correlation with CSF may be helpful for further evaluation. 2. Postoperative changes from prior right suboccipital craniotomy with right cerebellar encephalomalacia. No intracranial mass lesion or abnormal enhancement to suggest metastatic disease identified. 3. No other acute intracranial process identified.   Electronically Signed   By: Jeannine Boga M.D.   On: 03/21/2015 22:24   Mr [employer] Head/face  03/16/2015   CLINICAL DATA:    This exam is stored here for comparison purposes only and was performed at  an outside facility.   Please contact the originating institution for any  associated interpretation or report.     Oren Binet, MD  [facility] Pager:[phone]  If 7PM-7AM, please contact night-coverage [email or web address] Password [email or web address] 03/22/2015, 2:40 PM   LOS: 1 day

## 2015-03-22 NOTE — Progress Notes (Signed)
Occupational Therapy Evaluation Patient Details Name: [name] [name] MRN: [id number] DOB: [birth year] Today's Date: 03/22/2015    History of Present Illness 54 y.o. female with past medical history of GERD, hypothyroidism, anxiety, melanoma, brain metastases of melanoma (post status of craniotomy), TMJ, who presents with altered mental status.Per chart MRI  Shows abnormal FLAIR signal intensity in bilateral temporal occipital regions R>L with prominent involvement of the mesial right temporal lobe and hippocampus.    Clinical Impression   PTA, pt independent with ADL and mobility. Eval limited by c/o headache today. Pt's husband states cognition has not returned to baseline. Pt with flat presentation and no/little eye contact made. At this time, pt will most likely be able to D/C home with 24/7 S of husband when medically stable. Will plan to further assess tomorrow and follow acutely to facilitate safe D/C plan.     Follow Up Recommendations  Home health OT;Supervision/Assistance - 24 hour (pending progress)    Equipment Recommendations  None recommended by OT    Recommendations for Other Services       Precautions / Restrictions Precautions Precautions: fall     Mobility Bed Mobility Overal bed mobility: Needs Assistance Bed Mobility: Supine to Sit;Sit to Supine     Supine to sit: Supervision Sit to supine: Supervision      Transfers                 General transfer comment: Pt declined to get OOB    Balance                    will further assess                        ADL Overall ADL's : Needs assistance/impaired Eating/Feeding: Modified independent   Grooming: Set up                               Functional mobility during ADLs: Min guard (Ambulating to bathroom with nursing) General ADL Comments: Limited eval due to c/o headache. Pt with BUE AROM and strength WFL for ADL, although increased c/o headache pain with BUE  movement. At this time requires assistance due to headache. Will further assess. Husband present and states that he felt that pt has not " been herself" over the weekend and that he "questions a fall in the camper". Husband states pt has had a decrease in her appetite.     Vision Additional Comments: attempted to assess vision. Pt unable to participate with assessment due to c/o heachache pain. saccades were not smooth   Perception  Note some undershooting and squinting during finger/nose test. ? Affected by pain . Will further assess   Praxis      Pertinent Vitals/Pain  10/10 head Nursing notified     Hand Dominance Right   Extremity/Trunk Assessment Upper Extremity Assessment Upper Extremity Assessment: Generalized weakness           Communication Communication Communication: No difficulties   Cognition Arousal/Alertness: Lethargic Behavior During Therapy: Flat affect. Poor eye contact Overall Cognitive Status: Impaired/Different from baseline Area of Impairment: Orientation;Awareness;Problem solving;Attention Orientation Level: Disoriented to;Time Current Attention Level: Sustained       Awareness: Intellectual Problem Solving: Slow processing General Comments: limited by pain. Husband reports pt is not at her baseline. May assess with [place] Comments       Exercises  Shoulder Instructions      Home Living Family/patient expects to be discharged to:: Private residence Living Arrangements: Spouse/significant other Available Help at Discharge: Family;Available 24 hours/day Type of Home: House Home Access: Stairs to enter [employer] of Steps: 1   Home Layout: One level     Bathroom Shower/Tub: Tub/shower unit Shower/tub characteristics: Curtain Biochemist, clinical: Handicapped height Bathroom Accessibility: Yes   Home Equipment: Environmental consultant - 2 wheels;Shower seat          Prior Functioning/Environment Level of Independence:  Independent             OT Diagnosis: Generalized weakness;Cognitive deficits;Acute pain   OT Problem List: Decreased strength;Decreased activity tolerance;Decreased cognition;Decreased safety awareness;Pain   OT Treatment/Interventions: Self-care/ADL training;Therapeutic exercise;DME and/or AE instruction;Cognitive remediation/compensation;Therapeutic activities;Patient/family education;Balance training    OT Goals(Current goals can be found in the care plan section) Acute Rehab OT Goals Patient Stated Goal: to not be in pain OT Goal Formulation: With patient/family Time For Goal Achievement: 04/05/15 Potential to Achieve Goals: Good ADL Goals Pt Will Transfer to Toilet: with modified independence;ambulating Pt Will Perform Toileting - Clothing Manipulation and hygiene: with modified independence;sit to/from stand Additional ADL Goal #1: Demonstrate emergent awareness during ADL task in minimally distracting environment  OT Frequency: Min 2X/week   Barriers to D/C:            Co-evaluation              End of Session Nurse Communication: Mobility status;Patient requests pain meds  Activity Tolerance: No increased pain Patient left: in bed;with family/visitor present   Time: 7902-4097 OT Time Calculation (min): 14 min Charges:  OT General Charges $OT Visit: 1 Procedure OT Evaluation $Initial OT Evaluation Tier I: 1 Procedure G-Codes:    [name] [name],HILLARY 23-Mar-2015, 1:54 PM   [facility], OTR/L  [phone] 03/23/15

## 2015-03-22 NOTE — Evaluation (Signed)
Physical Therapy Evaluation Patient Details Name: SALINA STANFIELD MRN: [id number] DOB: 1961/03/17 Today's Date: 03/22/2015   History of Present Illness  54 y.o. female with past medical history of GERD, hypothyroidism, anxiety, melanoma, brain metastases of melanoma (post status of craniotomy), TMJ, who presents with altered mental status.Per chart MRI  Shows abnormal FLAIR signal intensity in bilateral temporal occipital regions R>L with prominent involvement of the mesial right temporal lobe and hippocampus.     Clinical Impression  Patient presents with problems listed below.  Will benefit from acute PT to maximize functional independence prior to discharge home with husband.  Pain/headache impacting mobility today.  Patient reports light sensitivity.  Noted decreased vision to left side (unable to locate objects on Lt side).  General weakness impacting gait/balance.  Recommend HHPT at discharge for continued therapy.    Follow Up Recommendations Home health PT;Supervision/Assistance - 24 hour    Equipment Recommendations  None recommended by PT    Recommendations for Other Services       Precautions / Restrictions Precautions Precautions: Fall Restrictions Weight Bearing Restrictions: No      Mobility  Bed Mobility Overal bed mobility: Needs Assistance Bed Mobility: Supine to Sit;Sit to Supine     Supine to sit: Min guard Sit to supine: Min guard   General bed mobility comments: Patient using bed rail.  Assist for safety/balance.  Transfers Overall transfer level: Needs assistance Equipment used: 1 person hand held assist Transfers: Sit to/from Stand Sit to Stand: Min assist         General transfer comment: Verbal cues for hand placement and to move slowly.  Assist to steady during transfer from bed and toilet, and for balance.  Ambulation/Gait Ambulation/Gait assistance: Min assist Ambulation Distance (Feet): 40 Feet Assistive device: 1 person hand held  assist Gait Pattern/deviations: Step-through pattern;Decreased stride length;Shuffle;Trunk flexed Gait velocity: Decreased Gait velocity interpretation: Below normal speed for age/gender General Gait Details: Patient with unsteady gait requiring UE support for balance.  Trunk flexed and head down due to headache per patient.  Stairs            Wheelchair Mobility    Modified Rankin (Stroke Patients Only)       Balance Overall balance assessment: Needs assistance Sitting-balance support: No upper extremity supported;Feet supported Sitting balance-Leahy Scale: Fair     Standing balance support: Single extremity supported Standing balance-Leahy Scale: Fair                               Pertinent Vitals/Pain Pain Assessment: 0-10 Pain Score: 10-Worst pain ever Pain Location: Headache behind eyes, posterior head down neck Pain Descriptors / Indicators: Headache Pain Intervention(s): Monitored during session;Repositioned;Patient requesting pain meds-RN notified    Home Living Family/patient expects to be discharged to:: Private residence Living Arrangements: Spouse/significant other Available Help at Discharge: Family;Available 24 hours/day Type of Home: House Home Access: Stairs to enter Entrance Stairs-Rails: None Entrance Stairs-Number of Steps: 1 Home Layout: One level Home Equipment: Walker - 2 wheels;Shower seat      Prior Function Level of Independence: Independent               Hand Dominance   Dominant Hand: Right    Extremity/Trunk Assessment   Upper Extremity Assessment: Defer to OT evaluation           Lower Extremity Assessment: Generalized weakness         Communication   Communication:  No difficulties  Cognition Arousal/Alertness: Lethargic Behavior During Therapy: Flat affect Overall Cognitive Status: Impaired/Different from baseline Area of Impairment: Orientation;Awareness;Problem solving;Attention Orientation  Level: Disoriented to;Time Current Attention Level: Sustained         Problem Solving: Slow processing General Comments: Patient reports patient not at baseline.  Limited by pain    General Comments General comments (skin integrity, edema, etc.): Patient at sink washing hands.  Unable to locate soap dispenser or trash can.  Both were on left side.  In sitting, patient unable to see objects (my fingers) in her Lt peripheral vision.    Exercises        Assessment/Plan    PT Assessment Patient needs continued PT services  PT Diagnosis Abnormality of gait;Generalized weakness;Acute pain;Altered mental status   PT Problem List Decreased strength;Decreased activity tolerance;Decreased balance;Decreased mobility;Decreased cognition;Decreased knowledge of use of DME;Pain  PT Treatment Interventions DME instruction;Gait training;Functional mobility training;Therapeutic activities;Therapeutic exercise;Balance training;Cognitive remediation;Patient/family education   PT Goals (Current goals can be found in the Care Plan section) Acute Rehab PT Goals Patient Stated Goal: to not be in pain PT Goal Formulation: With patient/family Time For Goal Achievement: 03/29/15 Potential to Achieve Goals: Good    Frequency Min 3X/week   Barriers to discharge        Co-evaluation               End of Session Equipment Utilized During Treatment: Gait belt Activity Tolerance: Patient limited by pain Patient left: in bed;with call bell/phone within reach;with family/visitor present Nurse Communication: Mobility status;Patient requests pain meds         Time: 7482-7078 PT Time Calculation (min) (ACUTE ONLY): 11 min   Charges:   PT Evaluation $Initial PT Evaluation Tier I: 1 Procedure     PT G CodesDespina Pole 03/30/2015, 6:35 PM Carita Pian. Sanjuana Kava, [place] Pager [phone]

## 2015-03-22 NOTE — Progress Notes (Addendum)
Subjective: No overnight events. Mental status improved today but not fully back to baseline. MRI brain completed and reviewed. Shows abnormal FLAIR signal intensity in bilateral temporal occipital regions R>L with prominent involvement of the mesial right temporal lobe and hippocampus.   Based on MRI results patient started on acyclovir overnight.   Initial CSF studies show 1 WBC, elevated protein. TSH elevated at 7.001, ammonia elevated at 77. Objective: Current vital signs: BP 144/80 mmHg  Pulse 107  Temp(Src) 98.5 F (36.9 C) (Oral)  Resp 18  Ht '5\' 7"'$  (1.702 m)  Wt 42 kg (92 lb 9.5 oz)  BMI 14.50 kg/m2  SpO2 100% Vital signs in last 24 hours: Temp:  [97.4 F (36.3 C)-98.5 F (36.9 C)] 98.5 F (36.9 C) (04/24 0625) Pulse Rate:  [101-130] 107 (04/24 0625) Resp:  [10-24] 18 (04/24 0625) BP: (121-161)/(55-109) 144/80 mmHg (04/24 0625) SpO2:  [91 %-100 %] 100 % (04/24 0625) Weight:  [42 kg (92 lb 9.5 oz)] 42 kg (92 lb 9.5 oz) (04/23 2246)  Intake/Output from previous day:   Intake/Output this shift: Total I/O In: 31 [P.O.:260] Out: -  Nutritional status: Diet regular Room service appropriate?: Yes; Fluid consistency:: Thin  Neurologic Exam: Mental Status: Alert, oriented to name and date, states "[place]" for location. Accurately able to describe what she was doing the past few days. No noted aphasia. No dysarthria. Will follow simple commands Cranial Nerves: II: PERRL III,IV, VI: ptosis not present, extra-ocular motions intact bilaterally, no gaze deviation or preference V,VII: smile symmetric, facial light touch sensation normal bilaterally VIII: hearing normal bilaterally Motor: Moves all extremities symmetrically and against light resistance. No tremor or abnormal movements noted. TSensory: LT intact in all extremities Deep Tendon Reflexes: 2+ and symmetric throughout Lab Results: Basic Metabolic Panel:  Recent Labs Lab 03/21/15 1456 03/21/15 1512  NA  135 136  K 3.5 3.6  CL 98 98  CO2 25  --   GLUCOSE 204* 204*  BUN 13 15  CREATININE 1.18* 1.10  CALCIUM 10.9*  --     Liver Function Tests:  Recent Labs Lab 03/21/15 1456  AST 31  ALT 19  ALKPHOS 75  BILITOT 1.0  PROT 7.1  ALBUMIN 4.2   No results for input(s): LIPASE, AMYLASE in the last 168 hours.  Recent Labs Lab 03/21/15 1706  AMMONIA 77*    CBC:  Recent Labs Lab 03/21/15 1456 03/21/15 1512  WBC 12.6*  --   NEUTROABS 9.7*  --   HGB 13.8 15.6*  HCT 41.1 46.0  MCV 90.1  --   PLT 280  --     Cardiac Enzymes: No results for input(s): CKTOTAL, CKMB, CKMBINDEX, TROPONINI in the last 168 hours.  Lipid Panel: No results for input(s): CHOL, TRIG, HDL, CHOLHDL, VLDL, LDLCALC in the last 168 hours.  CBG: No results for input(s): GLUCAP in the last 168 hours.  Microbiology: Results for orders placed or performed during the hospital encounter of 03/21/15  Gram stain     Status: None   Collection Time: 03/21/15  4:33 PM  Result Value Ref Range Status   Specimen Description CSF  Final   Special Requests TUBE 2  Final   Gram Stain NO WBC SEEN NO ORGANISMS SEEN   Final   Report Status 03/21/2015 FINAL  Final    Coagulation Studies:  Recent Labs  03/21/15 1456  LABPROT 15.4*  INR 1.20    Imaging: Ct Angio Head W/cm &/or Wo Cm  03/21/2015  CLINICAL DATA:  Altered mental status. Woke up from nap CT at chronic. Code stroke.  EXAM: CT ANGIOGRAPHY HEAD  TECHNIQUE: Multidetector CT imaging of the head was performed using the standard protocol during bolus administration of intravenous contrast. Multiplanar CT image reconstructions and MIPs were obtained to evaluate the vascular anatomy.  CONTRAST:  67m OMNIPAQUE IOHEXOL 350 MG/ML SOLN  COMPARISON:  CT head without contrast from the same day. MRI brain 02/25/2015.  FINDINGS: CT HEAD  Brain: Postsurgical changes of the right cerebellum are again noted. No acute cortical infarct, hemorrhage, or mass lesion is  present. There is no pathologic enhancement. The basal ganglia are intact. Insular cortex is intact.  Calvarium and skull base: Right occipital craniotomy is noted. The calvarium is otherwise intact.  Paranasal sinuses: Mild mucosal thickening is present in the anterior ethmoid air cells bilaterally and a left agger nasi cell  Orbits: Within normal limits  CTA HEAD  Anterior circulation: Minimal atherosclerotic calcifications are present within the cavernous internal carotid arteries. There are no significant stenoses. The ICA termini are normal bilaterally.  Anterior communicating artery is patent. The MCA bifurcations are intact. ACA and MCA branch vessels are unremarkable.  Posterior circulation: The right vertebral artery is slightly dominant to the left. PICA origins are visualized and normal. The vertebrobasilar junction is within normal limits. The basilar artery is unremarkable. The right posterior cerebral artery originates from the basilar tip. The left posterior cerebral artery is of fetal type. The PCA branch vessels are within normal limits.  Venous sinuses: The dural sinuses are patent. The right transverse sinus is dominant. The straight sinus is patent. The deep cerebral veins are patent.  Anatomic variants: Fetal type left posterior cerebral artery  IMPRESSION: 1. No acute abnormality. 2. Normal CTA circle of Willis without evidence for significant proximal stenosis, aneurysm, or branch vessel occlusion. 3. Postoperative changes of the right cerebellum. These results were called by telephone at the time of interpretation on 03/21/2015 at 3:39pm to Dr. PJim Like, who verbally acknowledged these results.   Electronically Signed   By: CSan MorelleM.D.   On: 03/21/2015 16:03   Ct Head Wo Contrast  03/21/2015   CLINICAL DATA:  Altered mental status.  Code stroke.  EXAM: CT HEAD WITHOUT CONTRAST  TECHNIQUE: Contiguous axial images were obtained from the base of the skull through the vertex  without intravenous contrast.  COMPARISON:  MRI 02/25/2015.  FINDINGS: RIGHT suboccipital craniectomy with encephalomalacia in the RIGHT cerebellar hemisphere.  No mass lesion, mass effect, midline shift, hydrocephalus, hemorrhage. No territorial ischemia or acute infarction. Small areas of thickening are present within the posterior maxillary sinuses which may represent fluid levels which can be seen in acute sinusitis. This could also represent ordinary mucosal thickening which is partially visible. Opacification of the frontal sinuses and partial opacification of the ethmoid air cells.  IMPRESSION: 1. No acute intracranial abnormality. 2. RIGHT suboccipital craniectomy with encephalomalacia from previous RIGHT cerebellar hemisphere resection. 3. Critical Value/emergent results were called by telephone at the time of interpretation on 03/21/2015 at 2:53 pm to Dr. CJerene Canny, who verbally acknowledged these results.   Electronically Signed   By: GDereck LigasM.D.   On: 03/21/2015 14:54   Mr BJeri CosWJAContrast  03/21/2015   CLINICAL DATA:  Initial evaluation for acute altered mental status, confusion. History of metastatic melanoma. Most recent brain MRIs have been negative for metastatic disease.  EXAM: MRI HEAD WITHOUT AND WITH CONTRAST  TECHNIQUE: Multiplanar, multiecho pulse sequences of the brain and surrounding structures were obtained without and with intravenous contrast.  CONTRAST:  75m MULTIHANCE GADOBENATE DIMEGLUMINE 529 MG/ML IV SOLN  COMPARISON:  Prior CT from earlier the same day as well as previous brain MRI from 02/25/2015.  FINDINGS: Postoperative changes from prior right suboccipital craniotomy again seen. There is encephalomalacia within the right cerebellar hemisphere, similar to previous. No mass lesion or abnormal enhancement identified to suggest intracranial metastasis.  There is abnormal T2/FLAIR signal intensity with edema involving predominantly the cortical gray  matter of the bilateral temporal occipital regions, right greater than left. There is involvement of the mesial right temporal lobe, although there is suggestion of probable increased signal intensity within the mesial left temporal lobe as well. The right hippocampus appears swollen with increased T2/FLAIR signal intensity. Right fornix is involved. No associated abnormal enhancement on post-contrast sequences.  No mass lesion or midline shift. No hydrocephalus. No extra-axial fluid collection.  Craniocervical junction within normal limits. Pituitary gland normal. No acute abnormality about the orbits.  Small amount layering opacity present within the left maxillary sinus. There is scattered mucosal thickening within the ethmoidal air cells. Frontal sinus partially opacified. Scattered fluid signal intensity present within the mastoid air cells bilaterally. Inner ear structures not well evaluated on this exam.  No acute large vessel territory infarct. No intracranial hemorrhage. Gray-white matter differentiation maintained. Normal intravascular flow voids preserved.  Mild degenerative changes noted within the upper cervical spine. Bone marrow signal intensity normal. Scalp soft tissues within normal limits.  IMPRESSION: 1. Abnormal T2/FLAIR signal intensity with edema involving predominantly the cortical gray matter of the bilateral temporal occipital regions, right greater than left, with prominent involvement of the mesial right temporal lobe and hippocampus, although there is more mild abnormal signal intensity within the mesial left temporal lobe as well. No associated enhancement. Findings are of uncertain etiology. Differential considerations include possible postictal changes, PRES, or possibly limbic encephalitis. Possible infectious etiology such as herpes encephalitis could also be considered. Correlation with CSF may be helpful for further evaluation. 2. Postoperative changes from prior right  suboccipital craniotomy with right cerebellar encephalomalacia. No intracranial mass lesion or abnormal enhancement to suggest metastatic disease identified. 3. No other acute intracranial process identified.   Electronically Signed   By: BJeannine BogaM.D.   On: 03/21/2015 22:24    Medications:  Scheduled: . acyclovir  450 mg Intravenous Q12H  . citalopram  20 mg Oral q morning - 10a  . heparin  5,000 Units Subcutaneous 3 times per day  . levothyroxine  100 mcg Oral QAC breakfast  . pantoprazole  40 mg Oral Daily  . piroxicam  20 mg Oral Daily  . sodium chloride  3 mL Intravenous Q12H    Assessment/Plan:  54y/o woman with history of metastatic melanoma with brain mets s/p resection presenting for acute onset of altered mental status after a recent camping trip. MRI brain shows bilateral R>L FLAIR signal abnormality most prominent in mesial temporal and hippocampus. Lab work pertinent for elevated TSH and ammonia. CSF pertinent for elevated protein.   Unclear etiology of symptoms. No clear triggering factor for PRES. CSF unremarkable but MRI pattern raises concern for HSV. History of cancer raises possibility of paraneoplastic syndrome. With elevated TSH would consider Hashimoto's encephalopathy or alternative autoimmune encephalopathy. Elevated ammonia also likely playing a role. Seizure and post ictal state also in the differential. Patient is currently alert and interacting appropriately making continued seizure activity less  likely.    -will continue acyclovir until HSV returned -repeat ammonia -check ANA, Thyroid peroxidase antibody, anti-AMPA, anti-NMDA, anti-CRMP5, paraneoplastic panel, hepatitis panel, hepatic function, HIV -check EEG -if HSV hepatic workup and EEG unremarkable would consider trial of high dose steroids for possible autoimmune encephalopathy -will continue to follow    LOS: 1 day   Leana Springston, DO [facility] [phone]  If 7pm- 7am,  please page neurology on call as listed in [place]. 03/22/2015  8:32 AM

## 2015-03-22 NOTE — Evaluation (Signed)
Clinical/Bedside Swallow Evaluation Patient Details  Name: [name] [name] MRN: [id number] Date of Birth: 12/26/1960  Today's Date: 03/22/2015 Time: SLP Start Time (ACUTE ONLY): 1051 SLP Stop Time (ACUTE ONLY): 1101 SLP Time Calculation (min) (ACUTE ONLY): 10 min  Past Medical History:  Past Medical History  Diagnosis Date  . TMJ (dislocation of temporomandibular joint)   . Thyroid disease   . Lung cancer     awaiting pathology  . Brain cancer     awaiting pathology; assuming this is a lung primary with brain mets  . Dementia   . Hypothyroidism   . GERD (gastroesophageal reflux disease)   . Anxiety    Past Surgical History:  Past Surgical History  Procedure Laterality Date  . Tonsillectomy    . Appendectomy    . Suboccipital craniectomy cervical laminectomy N/A 11/19/2013    Procedure: SUBOCCIPITAL CRANIECTOMY for tumor;  Surgeon: Eustace Moore, MD;  Location: [place] [facility];  Service: Neurosurgery;  Laterality: N/A;  . Lumbar wound debridement N/A 12/05/2013    Procedure: irrigation and debridement posterior cervical wound;  Surgeon: Eustace Moore, MD;  Location: [place] [facility];  Service: Neurosurgery;  Laterality: N/A;  . Video assisted thoracoscopy (vats)/wedge resection Left 03/05/2014    Procedure: VIDEO ASSISTED THORACOSCOPY (VATS)/WEDGE RESECTION;  Surgeon: Melrose Nakayama, MD;  Location: [facility];  Service: Thoracic;  Laterality: Left;   HPI:  54 y/o woman with history of metastatic melanoma with brain mets s/p resection presenting for acute onset of altered mental status after a recent camping trip. MRI brain shows bilateral R>L FLAIR signal abnormality most prominent in mesial temporal and hippocampus.  Per neuro notes, etiology unclear, w/u continues.  MS improved today but not yet at baseline.  Pt failed RN stroke swallow screen - swallow eval ordered.    Assessment / Plan / Recommendation Clinical Impression  Pt presents with normal oropharyngeal swallow function with  active mastication, swift swallow response, and no s/s of aspiration.  Pt is confused, and presents with some visual deficits, overreaching for items presented to her, limited awareness.  Per orders, OT eval is pending.  No swallowing f/u is warranted.  If pt would benefit from cogntive eval, please order.     Aspiration Risk  None    Diet Recommendation Regular;Thin liquid   Liquid Administration via: Cup;Straw Medication Administration: Whole meds with liquid Supervision: Patient able to self feed    Other  Recommendations Oral Care Recommendations: Oral care BID   Follow Up Recommendations  None (for swallowing)    SLP Swallow Goals     Swallow Study Prior Functional Status       General Date of Onset: 03/21/15 HPI: 54 y/o woman with history of metastatic melanoma with brain mets s/p resection presenting for acute onset of altered mental status after a recent camping trip. MRI brain shows bilateral R>L FLAIR signal abnormality most prominent in mesial temporal and hippocampus.  Per neuro notes, etiology unclear, w/u continues.  MS improved today but not yet at baseline.  Pt failed RN stroke swallow screen - swallow eval ordered.  Type of Study: Bedside swallow evaluation Previous Swallow Assessment: none per records Diet Prior to this Study: Regular;Thin liquids Temperature Spikes Noted: No Respiratory Status: Room air Behavior/Cognition: Alert;Confused Oral Cavity - Dentition: Missing dentition;Poor condition Self-Feeding Abilities: Able to feed self Patient Positioning: Upright in bed Baseline Vocal Quality: Clear Volitional Cough: Strong Volitional Swallow: Able to elicit    Oral/Motor/Sensory Function Overall Oral Motor/Sensory Function:  Appears within functional limits for tasks assessed   Ice Chips Ice chips: Within functional limits Presentation: Spoon   Thin Liquid Thin Liquid: Within functional limits Presentation: Cup;Self Fed;Straw    Nectar Thick Nectar  Thick Liquid: Not tested   Honey Thick Honey Thick Liquid: Not tested   Puree Puree: Within functional limits   Solid   [name] [name], [place] CCC/SLP Pager [phone]     Solid: Within functional limits       Juan Quam Laurice 03/22/2015,11:06 AM

## 2015-03-23 ENCOUNTER — Inpatient Hospital Stay (HOSPITAL_COMMUNITY): Payer: 59

## 2015-03-23 DIAGNOSIS — R51 Headache: Secondary | ICD-10-CM

## 2015-03-23 LAB — HEMOGLOBIN A1C
HEMOGLOBIN A1C: 5.7 % — AB (ref 4.8–5.6)
Mean Plasma Glucose: 117 mg/dL

## 2015-03-23 LAB — BASIC METABOLIC PANEL
ANION GAP: 8 (ref 5–15)
BUN: 7 mg/dL (ref 6–23)
CO2: 23 mmol/L (ref 19–32)
Calcium: 8.9 mg/dL (ref 8.4–10.5)
Chloride: 101 mmol/L (ref 96–112)
Creatinine, Ser: 0.91 mg/dL (ref 0.50–1.10)
GFR calc non Af Amer: 70 mL/min — ABNORMAL LOW (ref >90)
GFR, EST AFRICAN AMERICAN: 81 mL/min — AB (ref >90)
GLUCOSE: 68 mg/dL — AB (ref 70–99)
Potassium: 3.3 mmol/L — ABNORMAL LOW (ref 3.5–5.1)
Sodium: 132 mmol/L — ABNORMAL LOW (ref 135–145)

## 2015-03-23 LAB — CBC
HEMATOCRIT: 26.8 % — AB (ref 36.0–46.0)
Hemoglobin: 8.9 g/dL — ABNORMAL LOW (ref 12.0–15.0)
MCH: 30.1 pg (ref 26.0–34.0)
MCHC: 33.2 g/dL (ref 30.0–36.0)
MCV: 90.5 fL (ref 78.0–100.0)
Platelets: 202 10*3/uL (ref 150–400)
RBC: 2.96 MIL/uL — AB (ref 3.87–5.11)
RDW: 13.8 % (ref 11.5–15.5)
WBC: 6 10*3/uL (ref 4.0–10.5)

## 2015-03-23 LAB — B. BURGDORFI ANTIBODIES: B burgdorferi Ab IgG+IgM: <0.91 {ISR} (ref 0.00–0.90)

## 2015-03-23 LAB — ROCKY MTN SPOTTED FVR ABS PNL(IGG+IGM)
RMSF IGG: NEGATIVE
RMSF IgM: 0.33 index (ref 0.00–0.89)

## 2015-03-23 LAB — HIV ANTIBODY (ROUTINE TESTING W REFLEX): HIV Screen 4th Generation wRfx: NONREACTIVE

## 2015-03-23 LAB — THYROID PEROXIDASE ANTIBODY: THYROID PEROXIDASE ANTIBODY: 7 [IU]/mL (ref 0–34)

## 2015-03-23 MED ORDER — DEXTROSE 5 % IV SOLN
500.0000 mg | Freq: Once | INTRAVENOUS | Status: AC
  Administered 2015-03-23: 500 mg via INTRAVENOUS
  Filled 2015-03-23 (×2): qty 5

## 2015-03-23 MED ORDER — GABAPENTIN 300 MG PO CAPS
300.0000 mg | ORAL_CAPSULE | Freq: Every day | ORAL | Status: DC
  Administered 2015-03-23 – 2015-03-24 (×2): 300 mg via ORAL
  Filled 2015-03-23 (×2): qty 1

## 2015-03-23 MED ORDER — METHOCARBAMOL 1000 MG/10ML IJ SOLN
500.0000 mg | Freq: Four times a day (QID) | INTRAVENOUS | Status: DC
  Administered 2015-03-23 – 2015-03-25 (×7): 500 mg via INTRAVENOUS
  Filled 2015-03-23 (×12): qty 5

## 2015-03-23 MED ORDER — POTASSIUM CHLORIDE CRYS ER 20 MEQ PO TBCR
40.0000 meq | EXTENDED_RELEASE_TABLET | Freq: Once | ORAL | Status: AC
  Administered 2015-03-23: 40 meq via ORAL
  Filled 2015-03-23: qty 2

## 2015-03-23 NOTE — Progress Notes (Signed)
EEG completed; results pending.    

## 2015-03-23 NOTE — Progress Notes (Signed)
Subjective: Patient is complaining of headache involving right frontal region and posteriorly at the base of the skull and down the back of her neck as well. Confusion has resolved except for slight word finding difficulty. She was given 500 mg of Depacon earlier today IV which did not leave her pain. She's been getting partial relief with Toradol and tramadol.  Objective: Current vital signs: BP 107/67 mmHg  Pulse 84  Temp(Src) 98.8 F (37.1 C) (Oral)  Resp 16  Ht '5\' 7"'$  (1.702 m)  Wt 42 kg (92 lb 9.5 oz)  BMI 14.50 kg/m2  SpO2 99%  Neurologic Exam: Alert and in no acute distress. Patient was well-oriented to time as well as place. Extraocular movements were full and conjugate. No facial weakness noted. Speech was normal with no dysarthria. She moved extremities equally with no focal weakness.  EEG today showed mild generalized nonspecific slowing but was otherwise unremarkable.  CSF HSV results are pending. Anti-AMPA, anti-NMDA, anti-CRMP5, paraneoplastic panel results also still pending.  Medications: I have reviewed the patient's current medications.  Assessment/Plan: Etiology for mental status changes and changes seen on MRI study on 03/21/2015 remains unclear. Mental status has markedly improved. Headache has persisted.  I will give her a trial of Robaxin parenterally, 500 mg every 6 hours. He may continue Ultram as well as Toradol as needed. Also recommend continuing acyclovir for now, since HSV test results still pending.  C.R. Nicole Kindred, MD [facility] [phone]  03/23/2015  6:09 PM

## 2015-03-23 NOTE — Progress Notes (Signed)
PT Cancellation Note  Patient Details Name: [name] [name] MRN: [id number] DOB: 1961/09/05   Cancelled Treatment:    Reason Eval/Treat Not Completed: Patient at procedure or test/unavailable. PT off floor at EEG. PT to return as able.   Kingsley Callander 03/23/2015, 8:53 AM   Kittie Plater, PT, DPT Pager #: [phone] Office #: [phone]

## 2015-03-23 NOTE — Progress Notes (Addendum)
PATIENT DETAILS Name: [name] [name] Age: 54 y.o. Sex: female Date of Birth: [birth year] Admit Date: 03/21/2015 Admitting Physician Ivor Costa, MD [id number], Annie Main, MD  Subjective: Continues to complain of headache-mostly behind her right eye. Awake and alert.   Assessment/Plan: Principal Problem:   Acute encephalopathy: Etiology unknown, MRI of the brain shows abnormal T2/FLAIR signal intensity with edema involving predominantly the cortical gray matter of the bilateral temporal occipital regions. Underwent lumbar puncture-CSF fairly benign-but with elevated protein levels. Etiology remains unknown, neurology recommending to continue with IV acyclovir till HSV PCR comes back. Personally spoke with Dr. Janann Colonel on 4/24-he doubts PRES.  EEG negative for seizures. Given history of metastatic melanoma-paraneoplastic syndrome always a consideration-neurology has ordered numerous serological tests. HIV, RMSF, Lyme's titer pending. Blood culture remains negative so far Follow clinical course.  Active Problems: Headache: Some response to Toradol and Reglan, spoke with Dr. Nicole Kindred on 4/5-oh recommended IV Depakote. Await further recommendations from neurology team.  Hypokalemia: Replete and recheck  Anemia: Drop in hemoglobin likely secondary to acute illness, IV fluid dilution. No evidence of bleeding. Repeat hemoglobin in a.m. and monitor for now.  Anxiety: Stable. No suicidal or homicidal ideations.Continue Celexa  Hypothyroidism: TSH mildly elevated, Synthroid was recently increased to 100 g. Continue. We will need to repeat TSH in the next 3 months.  GERD: Continue PPI  History of melanoma, post status of craniotomy secondary to melanoma metastasized to brain: Follows with oncology at [facility], gets radiation oncology treatments at the cancer center at [facility]. MRI brain without any suggestion of recurrence of metastatic disease.  Disposition: Remain  inpatient  Antimicrobial agents  See below  Anti-infectives    Start     Dose/Rate Route Frequency Ordered Stop   03/21/15 2300  acyclovir (ZOVIRAX) 450 mg in dextrose 5 % 100 mL IVPB     450 mg 109 mL/hr over 60 Minutes Intravenous Every 12 hours 03/21/15 2258        DVT Prophylaxis: Prophylactic Heparin   Code Status: Full code  Family Communication None at bedside   Procedures: Lumbar puncture 4/23  CONSULTS:  neurology   MEDICATIONS: Scheduled Meds: . acyclovir  450 mg Intravenous Q12H  . citalopram  20 mg Oral q morning - 10a  . heparin  5,000 Units Subcutaneous 3 times per day  . levothyroxine  100 mcg Oral QAC breakfast  . pantoprazole  40 mg Oral Daily  . piroxicam  20 mg Oral Daily  . sodium chloride  3 mL Intravenous Q12H  . valproate sodium  500 mg Intravenous Once   Continuous Infusions:   PRN Meds:.alum & mag hydroxide-simeth, hydrocortisone cream, LORazepam, traMADol    PHYSICAL EXAM: Vital signs in last 24 hours: Filed Vitals:   03/22/15 2148 03/23/15 0131 03/23/15 0657 03/23/15 1012  BP: 1'15/70 94/55 99/80 '$ 98/53  Pulse: 98 89 90 84  Temp: 98.8 F (37.1 C) 99 F (37.2 C) 98.6 F (37 C) 99.3 F (37.4 C)  TempSrc: Oral Oral Oral Oral  Resp: '18 16 16 16  '$ Height:      Weight:      SpO2: 100% 100% 99% 99%    Weight change:  Filed Weights   03/21/15 2246  Weight: 42 kg (92 lb 9.5 oz)   Body mass index is 14.5 kg/(m^2).   Gen Exam: Awake and alert with clear speech.   Neck: Supple, No JVD.   Chest:  B/L Clear.  No rales or rhonchi CVS: S1 S2 Regular, no murmurs.  Abdomen: soft, BS +, non tender, non distended.  Extremities: no edema, lower extremities warm to touch. Neurologic: Non Focal.   Skin: No Rash.   Wounds: N/A.    Intake/Output from previous day:  Intake/Output Summary (Last 24 hours) at 03/23/15 1308 Last data filed at 03/23/15 1109  Gross per 24 hour  Intake    469 ml  Output      0 ml  Net    469 ml      LAB RESULTS: CBC  Recent Labs Lab 03/21/15 1456 03/21/15 1512 03/22/15 0815 03/23/15 0634  WBC 12.6*  --  10.0 6.0  HGB 13.8 15.6* 11.3* 8.9*  HCT 41.1 46.0 33.8* 26.8*  PLT 280  --  258 202  MCV 90.1  --  89.4 90.5  MCH 30.3  --  29.9 30.1  MCHC 33.6  --  33.4 33.2  RDW 13.4  --  13.5 13.8  LYMPHSABS 1.6  --   --   --   MONOABS 1.1*  --   --   --   EOSABS 0.2  --   --   --   BASOSABS 0.0  --   --   --     Chemistries   Recent Labs Lab 03/21/15 1456 03/21/15 1512 03/22/15 0815 03/23/15 0634  NA 135 136 134* 132*  K 3.5 3.6 3.8 3.3*  CL 98 98 101 101  CO2 25  --  22 23  GLUCOSE 204* 204* 131* 68*  BUN '13 15 11 7  '$ CREATININE 1.18* 1.10 0.93 0.91  CALCIUM 10.9*  --  9.6 8.9    CBG: No results for input(s): GLUCAP in the last 168 hours.  GFR Estimated Creatinine Clearance: 46.9 mL/min (by C-G formula based on Cr of 0.91).  Coagulation profile  Recent Labs Lab 03/21/15 1456  INR 1.20    Cardiac Enzymes No results for input(s): CKMB, TROPONINI, MYOGLOBIN in the last 168 hours.  Invalid input(s): CK  Invalid input(s): POCBNP No results for input(s): DDIMER in the last 72 hours.  Recent Labs  03/22/15 0815  HGBA1C 5.7*   No results for input(s): CHOL, HDL, LDLCALC, TRIG, CHOLHDL, LDLDIRECT in the last 72 hours.  Recent Labs  03/21/15 1706  TSH 7.001*    Recent Labs  03/21/15 1706  VITAMINB12 1407*   No results for input(s): LIPASE, AMYLASE in the last 72 hours.  Urine Studies No results for input(s): UHGB, CRYS in the last 72 hours.  Invalid input(s): UACOL, UAPR, USPG, UPH, UTP, UGL, UKET, UBIL, UNIT, UROB, ULEU, UEPI, UWBC, URBC, UBAC, CAST, UCOM, BILUA  MICROBIOLOGY: Recent Results (from the past 240 hour(s))  CSF culture     Status: None (Preliminary result)   Collection Time: 03/21/15  4:33 PM  Result Value Ref Range Status   Specimen Description CSF  Final   Special Requests NONE  Final   Gram Stain   Final     CYTOSPIN WBC PRESENT, PREDOMINANTLY MONONUCLEAR NO ORGANISMS SEEN Performed at [facility]    Culture   Final    NO GROWTH 1 DAY Performed at [facility]    Report Status PENDING  Incomplete  Gram stain     Status: None   Collection Time: 03/21/15  4:33 PM  Result Value Ref Range Status   Specimen Description CSF  Final   Special Requests TUBE 2  Final   Gram  Stain NO WBC SEEN NO ORGANISMS SEEN   Final   Report Status 03/21/2015 FINAL  Final  Culture, blood (routine x 2)     Status: None (Preliminary result)   Collection Time: 03/21/15 10:31 PM  Result Value Ref Range Status   Specimen Description BLOOD LEFT ARM  Final   Special Requests BOTTLES DRAWN AEROBIC AND ANAEROBIC 10 CC EACH  Final   Culture   Final           BLOOD CULTURE RECEIVED NO GROWTH TO DATE CULTURE WILL BE HELD FOR 5 DAYS BEFORE ISSUING A FINAL NEGATIVE REPORT Performed at [facility]    Report Status PENDING  Incomplete  Culture, blood (routine x 2)     Status: None (Preliminary result)   Collection Time: 03/21/15 10:40 PM  Result Value Ref Range Status   Specimen Description BLOOD RIGHT ARM  Final   Special Requests BOTTLES DRAWN AEROBIC AND ANAEROBIC 10CC EACH  Final   Culture   Final           BLOOD CULTURE RECEIVED NO GROWTH TO DATE CULTURE WILL BE HELD FOR 5 DAYS BEFORE ISSUING A FINAL NEGATIVE REPORT Performed at [facility]    Report Status PENDING  Incomplete    RADIOLOGY STUDIES/RESULTS: Ct Angio Head W/cm &/or Wo Cm  03/21/2015   CLINICAL DATA:  Altered mental status. Woke up from nap CT at chronic. Code stroke.  EXAM: CT ANGIOGRAPHY HEAD  TECHNIQUE: Multidetector CT imaging of the head was performed using the standard protocol during bolus administration of intravenous contrast. Multiplanar CT image reconstructions and MIPs were obtained to evaluate the vascular anatomy.  CONTRAST:  31m OMNIPAQUE IOHEXOL 350 MG/ML SOLN  COMPARISON:  CT head without contrast  from the same day. MRI brain 02/25/2015.  FINDINGS: CT HEAD  Brain: Postsurgical changes of the right cerebellum are again noted. No acute cortical infarct, hemorrhage, or mass lesion is present. There is no pathologic enhancement. The basal ganglia are intact. Insular cortex is intact.  Calvarium and skull base: Right occipital craniotomy is noted. The calvarium is otherwise intact.  Paranasal sinuses: Mild mucosal thickening is present in the anterior ethmoid air cells bilaterally and a left agger nasi cell  Orbits: Within normal limits  CTA HEAD  Anterior circulation: Minimal atherosclerotic calcifications are present within the cavernous internal carotid arteries. There are no significant stenoses. The ICA termini are normal bilaterally.  Anterior communicating artery is patent. The MCA bifurcations are intact. ACA and MCA branch vessels are unremarkable.  Posterior circulation: The right vertebral artery is slightly dominant to the left. PICA origins are visualized and normal. The vertebrobasilar junction is within normal limits. The basilar artery is unremarkable. The right posterior cerebral artery originates from the basilar tip. The left posterior cerebral artery is of fetal type. The PCA branch vessels are within normal limits.  Venous sinuses: The dural sinuses are patent. The right transverse sinus is dominant. The straight sinus is patent. The deep cerebral veins are patent.  Anatomic variants: Fetal type left posterior cerebral artery  IMPRESSION: 1. No acute abnormality. 2. Normal CTA circle of Willis without evidence for significant proximal stenosis, aneurysm, or branch vessel occlusion. 3. Postoperative changes of the right cerebellum. These results were called by telephone at the time of interpretation on 03/21/2015 at 3:39pm to Dr. PJim Like, who verbally acknowledged these results.   Electronically Signed   By: CSan MorelleM.D.   On: 03/21/2015 16:03   Ct  Head Wo  Contrast  03/21/2015   CLINICAL DATA:  Altered mental status.  Code stroke.  EXAM: CT HEAD WITHOUT CONTRAST  TECHNIQUE: Contiguous axial images were obtained from the base of the skull through the vertex without intravenous contrast.  COMPARISON:  MRI 02/25/2015.  FINDINGS: RIGHT suboccipital craniectomy with encephalomalacia in the RIGHT cerebellar hemisphere.  No mass lesion, mass effect, midline shift, hydrocephalus, hemorrhage. No territorial ischemia or acute infarction. Small areas of thickening are present within the posterior maxillary sinuses which may represent fluid levels which can be seen in acute sinusitis. This could also represent ordinary mucosal thickening which is partially visible. Opacification of the frontal sinuses and partial opacification of the ethmoid air cells.  IMPRESSION: 1. No acute intracranial abnormality. 2. RIGHT suboccipital craniectomy with encephalomalacia from previous RIGHT cerebellar hemisphere resection. 3. Critical Value/emergent results were called by telephone at the time of interpretation on 03/21/2015 at 2:53 pm to Dr. Jerene Canny , who verbally acknowledged these results.   Electronically Signed   By: Dereck Ligas M.D.   On: 03/21/2015 14:54   Mr Jeri Cos HW Contrast  03/21/2015   CLINICAL DATA:  Initial evaluation for acute altered mental status, confusion. History of metastatic melanoma. Most recent brain MRIs have been negative for metastatic disease.  EXAM: MRI HEAD WITHOUT AND WITH CONTRAST  TECHNIQUE: Multiplanar, multiecho pulse sequences of the brain and surrounding structures were obtained without and with intravenous contrast.  CONTRAST:  53m MULTIHANCE GADOBENATE DIMEGLUMINE 529 MG/ML IV SOLN  COMPARISON:  Prior CT from earlier the same day as well as previous brain MRI from 02/25/2015.  FINDINGS: Postoperative changes from prior right suboccipital craniotomy again seen. There is encephalomalacia within the right cerebellar hemisphere,  similar to previous. No mass lesion or abnormal enhancement identified to suggest intracranial metastasis.  There is abnormal T2/FLAIR signal intensity with edema involving predominantly the cortical gray matter of the bilateral temporal occipital regions, right greater than left. There is involvement of the mesial right temporal lobe, although there is suggestion of probable increased signal intensity within the mesial left temporal lobe as well. The right hippocampus appears swollen with increased T2/FLAIR signal intensity. Right fornix is involved. No associated abnormal enhancement on post-contrast sequences.  No mass lesion or midline shift. No hydrocephalus. No extra-axial fluid collection.  Craniocervical junction within normal limits. Pituitary gland normal. No acute abnormality about the orbits.  Small amount layering opacity present within the left maxillary sinus. There is scattered mucosal thickening within the ethmoidal air cells. Frontal sinus partially opacified. Scattered fluid signal intensity present within the mastoid air cells bilaterally. Inner ear structures not well evaluated on this exam.  No acute large vessel territory infarct. No intracranial hemorrhage. Gray-white matter differentiation maintained. Normal intravascular flow voids preserved.  Mild degenerative changes noted within the upper cervical spine. Bone marrow signal intensity normal. Scalp soft tissues within normal limits.  IMPRESSION: 1. Abnormal T2/FLAIR signal intensity with edema involving predominantly the cortical gray matter of the bilateral temporal occipital regions, right greater than left, with prominent involvement of the mesial right temporal lobe and hippocampus, although there is more mild abnormal signal intensity within the mesial left temporal lobe as well. No associated enhancement. Findings are of uncertain etiology. Differential considerations include possible postictal changes, PRES, or possibly limbic  encephalitis. Possible infectious etiology such as herpes encephalitis could also be considered. Correlation with CSF may be helpful for further evaluation. 2. Postoperative changes from prior right suboccipital craniotomy with right cerebellar  encephalomalacia. No intracranial mass lesion or abnormal enhancement to suggest metastatic disease identified. 3. No other acute intracranial process identified.   Electronically Signed   By: Jeannine Boga M.D.   On: 03/21/2015 22:24   Mr Outside Films Head/face  03/16/2015   CLINICAL DATA:    This exam is stored here for comparison purposes only and was performed at  an outside facility.   Please contact the originating institution for any  associated interpretation or report.     Oren Binet, MD  [facility] Pager:[phone]  If 7PM-7AM, please contact night-coverage [email or web address] Password [email or web address] 03/23/2015, 1:08 PM   LOS: 2 days

## 2015-03-23 NOTE — Progress Notes (Signed)
Physical Therapy Treatment Patient Details Name: [name] [name] MRN: [id number] DOB: 1961/02/09 Today's Date: 03/23/2015    History of Present Illness 54 y.o. female with past medical history of GERD, hypothyroidism, anxiety, melanoma, brain metastases of melanoma (post status of craniotomy), TMJ, who presents with altered mental status.Per chart MRI  Shows abnormal FLAIR signal intensity in bilateral temporal occipital regions R>L with prominent involvement of the mesial right temporal lobe and hippocampus.     PT Comments    Pt demonstrated decreased awareness of deficits during today's session, impacting pt's safety with ambulation and transfers.  Pt has decreased peripheral vision on L side, and during ambulation felt like the person on her left was "too close".  Her gait pattern had characteristics of ataxia, possibly due to decreased visual perception.  Pt and husband were unable to demonstrate safe navigation of stairs due to pt's visual deficits and difficulty following commands.  Needs 24/7 supervision and safe demonstration of stairs with significant other in order for safe d/c home. Acute PT to follow to address remaining deficits.     Follow Up Recommendations  Supervision/Assistance - 24 hour;Home health PT     Equipment Recommendations  Rolling walker with 5" wheels    Recommendations for Other Services       Precautions / Restrictions Precautions Precautions: Fall Restrictions Weight Bearing Restrictions: No    Mobility  Bed Mobility Overal bed mobility: Needs Assistance Bed Mobility: Supine to Sit     Supine to sit: Supervision Sit to supine: Min guard   General bed mobility comments: sitting eob on arrival  Transfers Overall transfer level: Needs assistance Equipment used: 1 person hand held assist Transfers: Sit to/from Stand Sit to Stand: Min assist         General transfer comment: declined  Ambulation/Gait Ambulation/Gait assistance: Min  assist Ambulation Distance (Feet): 75 Feet Assistive device: 1 person hand held assist Gait Pattern/deviations: Ataxic;Decreased stride length;Leaning posteriorly;Staggering right;Drifts right/left Gait velocity: Decreased Gait velocity interpretation: Below normal speed for age/gender General Gait Details: Pt demonstrated ataxic gait pattern; unclear if actual ataxia or due to visual deficits.  Pt HHA support on R, reached with L for rail and other items to support while hall walking.  Gait was generally unsteady, and pt felt as though person on her L was "too close" and was having a difficult time perceiving what was on her left.   Stairs Stairs: Yes Stairs assistance: Mod assist Stair Management: Forwards (With HHA from significant other) Number of Stairs: 3 General stair comments: Pt has decreased depth perception and had a difficult time navigating stairs.  Pt and significant other did not demonstrated safe navigation of stairs due to pt's impulsivity and visual deficits. Pt demonstrated decreased ability to follow commands and demonstrated poor motor sequencing, possibly secondary to decreased depth perception and vision on L.   Wheelchair Mobility    Modified Rankin (Stroke Patients Only)       Balance Overall balance assessment: Needs assistance         Standing balance support: Single extremity supported;During functional activity Standing balance-Leahy Scale: Fair Standing balance comment: Decreased balance during ambulation, possibly due to visual deficits on the L side.  Hand held assist with ambulation and reached for external support with opposite hand while walking.                    Cognition Arousal/Alertness: Awake/alert Behavior During Therapy:  (liable) Overall Cognitive Status: Impaired/Different from baseline Area of Impairment:  Attention;Memory;Following commands;Safety/judgement;Awareness;Problem solving Orientation Level: Disoriented  to;Time Current Attention Level: Sustained Memory: Decreased short-term memory Following Commands: Follows one step commands inconsistently;Follows one step commands with increased time Safety/Judgement: Decreased awareness of safety;Decreased awareness of deficits Awareness: Intellectual Problem Solving: Slow processing;Difficulty sequencing General Comments: Pt liable stating that no MD has seen her and she needs to talk to them. RN Havy arriving to provide medication and tells patient you spoke to the doctor before your EEG. Pt crying stating "WHY can't I remember" Pt reporting to OT that she does not need to "work here with therapy because she is disabled and doesnt work any more" Pt fixated on pain medication and severe headache. SPouse [name] present during session    Exercises      General Comments General comments (skin integrity, edema, etc.): Pt has decreased L peripheral vision.       Pertinent Vitals/Pain Pain Assessment: 0-10 Pain Score: 10-Worst pain ever Pain Location: headache Pain Descriptors / Indicators: Headache Pain Intervention(s): Monitored during session;RN gave pain meds during session    Home Living                      Prior Function            PT Goals (current goals can now be found in the care plan section) Acute Rehab PT Goals Patient Stated Goal: to not be in pain Progress towards PT goals: Progressing toward goals    Frequency  Min 3X/week    PT Plan Current plan remains appropriate    Co-evaluation             End of Session Equipment Utilized During Treatment: Gait belt Activity Tolerance: Patient limited by pain Patient left: in bed;with call bell/phone within reach;with bed alarm set;with family/visitor present     Time: 8110-3159 PT Time Calculation (min) (ACUTE ONLY): 35 min  Charges:  $Gait Training: 8-22 mins $Therapeutic Activity: 8-22 mins                    G Codes:      Panzy Bubeck 04-07-15, 3:47  PM  Lucas Mallow, SPT (student physical therapist) Office phone: [phone]

## 2015-03-23 NOTE — Progress Notes (Signed)
Occupational Therapy Treatment Patient Details Name: [name] [name] MRN: [id number] DOB: [birth year] Today's Date: 03/23/2015    History of present illness 54 y.o. female with past medical history of GERD, hypothyroidism, anxiety, melanoma, brain metastases of melanoma (post status of craniotomy), TMJ, who presents with altered mental status.Per chart MRI  Shows abnormal FLAIR signal intensity in bilateral temporal occipital regions R>L with prominent involvement of the mesial right temporal lobe and hippocampus.    OT comments  Pt demonstrates vision deficits ( homonymous hemianopsia) pt reports "really cool magic trick wow" pt tearful throughout session due to memory deficits and headache. Pt reports sensitivity to the over head lights. OT to attempt pt on 03/24/15 for mobility. Pt declining due to HA. Spouse present and education with mobility /adls with visual changes started this session.   Follow Up Recommendations  Home health OT;Supervision/Assistance - 24 hour    Equipment Recommendations  None recommended by OT    Recommendations for Other Services      Precautions / Restrictions Precautions Precautions: Fall Restrictions Weight Bearing Restrictions: No       Mobility Bed Mobility Overal bed mobility: Needs Assistance Bed Mobility: Supine to Sit     Supine to sit: Supervision Sit to supine: Min guard   General bed mobility comments: sitting eob on arrival  Transfers Overall transfer level: Needs assistance Equipment used: 1 person hand held assist Transfers: Sit to/from Stand Sit to Stand: Min assist         General transfer comment: declined    Balance Overall balance assessment: Needs assistance         Standing balance support: Single extremity supported;During functional activity Standing balance-Leahy Scale: Fair Standing balance comment: Decreased balance during ambulation, possibly due to visual deficits on the L side.  Hand held assist with  ambulation and reached for external support with opposite hand while walking.                   ADL                                         General ADL Comments: Pt provided medication in L hand and unaware. Pt with poor sensation and poor visual attention. Pt dropping medication with lack of awareness. Visual assessment reveals L upper and lower quadrant homonymous hemianopsia. pt ask to occlude each eye during assessment. Pt states "wow that is like a really cool magic trick. NOPE its gone again but its not gone. WOW" Pt and spouse educated on turning head and incr risk for error with adls and falls due to visual changes.      Vision Eye Alignment: Within Functional Limits   Ocular Range of Motion: Within Functional Limits Tracking/Visual Pursuits: Decreased smoothness of eye movement to LEFT superior field;Decreased smoothness of eye movement to LEFT inferior field;Impaired - to be further tested in functional context;Unable to hold eye position out of midline;Requires cues, head turns, or add eye shifts to track       Depth Perception: Overshoots Additional Comments: pt reports objects appear alot closer than they really are. Pt states its like htey are jumping out at her. Pt with L eye pupil enlargement during testing compared to the R eye   Perception     Praxis      Cognition   Behavior During Therapy:  (liable) Overall Cognitive Status: Impaired/Different from baseline  Area of Impairment: Attention;Memory;Following commands;Safety/judgement;Awareness;Problem solving Orientation Level: Disoriented to;Time Current Attention Level: Sustained Memory: Decreased short-term memory  Following Commands: Follows one step commands inconsistently;Follows one step commands with increased time Safety/Judgement: Decreased awareness of safety;Decreased awareness of deficits Awareness: Intellectual Problem Solving: Slow processing;Difficulty sequencing General  Comments: Pt liable stating that no MD has seen her and she needs to talk to them. RN [name] [name] arriving to provide medication and tells patient you spoke to the doctor before your EEG. Pt crying stating "WHY can't I remember" Pt reporting to OT that she does not need to "work here with therapy because she is disabled and doesnt work any more" Pt fixated on pain medication and severe headache. SPouse [name] [name] present during session    Extremity/Trunk Assessment               Exercises     Shoulder Instructions       General Comments      Pertinent Vitals/ Pain       Pain Assessment: 0-10 Pain Score: 10-Worst pain ever Pain Location: headache Pain Descriptors / Indicators: Headache Pain Intervention(s): Monitored during session;RN gave pain meds during session  Home Living                                          Prior Functioning/Environment              Frequency Min 2X/week     Progress Toward Goals  OT Goals(current goals can now be found in the care plan section)  Progress towards OT goals: Not progressing toward goals - comment (due to pain)  Acute Rehab OT Goals Patient Stated Goal: to not be in pain OT Goal Formulation: With patient/family Time For Goal Achievement: 04/05/15 Potential to Achieve Goals: Good ADL Goals Pt Will Transfer to Toilet: with modified independence;ambulating Pt Will Perform Toileting - Clothing Manipulation and hygiene: with modified independence;sit to/from stand Additional ADL Goal #1: Demonstrate emergent awareness during ADL task in minimally distracting environment  Plan Discharge plan remains appropriate    Co-evaluation                 End of Session     Activity Tolerance Patient limited by pain   Patient Left in bed;with bed alarm set;with nursing/sitter in room;with family/visitor present   Nurse Communication Mobility status;Precautions        Time: 9163-8466 OT Time Calculation (min): 14  min  Charges: OT General Charges $OT Visit: 1 Procedure OT Treatments $Cognitive Skills Development: 8-22 mins  Peri Maris 03/23/2015, 2:07 PM  Pager: [phone]

## 2015-03-23 NOTE — Procedures (Signed)
EEG report.  Brief clinical history: 53y/o woman with history of metastatic melanoma with brain mets s/p resection presenting for acute onset of altered mental status after a recent camping trip. MRI brain shows bilateral R>L FLAIR signal abnormality most prominent in mesial temporal and hippocampus. Lab work pertinent for elevated TSH and ammonia. CSF pertinent for elevated protein.   Technique: this is a 17 channel routine scalp EEG performed at the bedside with bipolar and monopolar montages arranged in accordance to the international 10/20 system of electrode placement. One channel was dedicated to EKG recording.  The study was performed during wakefulness, drowsiness, and stage 2 sleep. No activating procedures performed.  Description:In the wakeful state, the best background consisted of a medium amplitude, posterior dominant, poorly sustained, symmetric and reactive 7-7/5 Hz rhythm. Drowsiness demonstrated dropout of the alpha rhythm. Stage 2 sleep showed symmetric and synchronous sleep spindles without intermixed epileptiform discharges. No focal or generalized epileptiform discharges noted.  EKG showed sinus rhythm.  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 No electrographic seizures noted. Please, be aware that the absence of epileptiform discharges do not exclude the possibility of epilepsy.  Clinical correlation is advised.   Dorian Pod, MD [facility]

## 2015-03-24 DIAGNOSIS — G4489 Other headache syndrome: Secondary | ICD-10-CM | POA: Insufficient documentation

## 2015-03-24 LAB — CBC
HCT: 28.1 % — ABNORMAL LOW (ref 36.0–46.0)
Hemoglobin: 9.4 g/dL — ABNORMAL LOW (ref 12.0–15.0)
MCH: 30.5 pg (ref 26.0–34.0)
MCHC: 33.5 g/dL (ref 30.0–36.0)
MCV: 91.2 fL (ref 78.0–100.0)
Platelets: 210 10*3/uL (ref 150–400)
RBC: 3.08 MIL/uL — ABNORMAL LOW (ref 3.87–5.11)
RDW: 13.7 % (ref 11.5–15.5)
WBC: 4.8 10*3/uL (ref 4.0–10.5)

## 2015-03-24 LAB — BASIC METABOLIC PANEL
Anion gap: 10 (ref 5–15)
BUN: <5 mg/dL — ABNORMAL LOW (ref 6–23)
CO2: 22 mmol/L (ref 19–32)
Calcium: 9.7 mg/dL (ref 8.4–10.5)
Chloride: 108 mmol/L (ref 96–112)
Creatinine, Ser: 0.87 mg/dL (ref 0.50–1.10)
GFR, EST AFRICAN AMERICAN: 86 mL/min — AB (ref >90)
GFR, EST NON AFRICAN AMERICAN: 74 mL/min — AB (ref >90)
Glucose, Bld: 63 mg/dL — ABNORMAL LOW (ref 70–99)
Potassium: 4 mmol/L (ref 3.5–5.1)
Sodium: 140 mmol/L (ref 135–145)

## 2015-03-24 LAB — HERPES SIMPLEX VIRUS(HSV) DNA BY PCR
HSV 1 DNA: NEGATIVE
HSV 2 DNA: NEGATIVE

## 2015-03-24 NOTE — Progress Notes (Signed)
°   03/24/15 1205  Clinical Encounter Type  Visited With Patient;Patient and family together;Health care provider  Visit Type Initial;Psychological support;Spiritual support;Social support  Referral From Patient  Consult/Referral To Chaplain  Spiritual Encounters  Spiritual Needs Prayer;Emotional  Stress Factors  Patient Stress Factors Health changes;Lack of knowledge;Loss  Family Stress Factors Lack of knowledge   Responded to request to visit patient. Patient and Ronalee Belts (her spouse) were in the room. Patient was concerned about recent changes in her health, and didn't understand why she was having problems. Patient was aware that I was present in the room and had no current episodes of blurred vision. Patient spoke about everything that had been going and the stress that she is currently under. Patient is under a great deal of stress about her mother. Patient stated that she feels as if she has had more on her than other people. Patient lost her father when he and her sister had a boating accident. Patients spouse was taken away from her for five months and that was hard for her. Patient is a survivor of cancer, and wants to get better to travel the world with Ronalee Belts in their [place]. Provided an empathetic ear, hand to hold, and assurance that she was not alone. Patient was glad to see that she was not alone and that others were walking with her through her trail. Patient is looking forward to following her pastor when she gets out of the hospital. Patient and spouse have a relationship that is centered on [place]. Chaplain Intern 03/24/2015, 2:17 PM

## 2015-03-24 NOTE — Progress Notes (Signed)
ANTIBIOTIC CONSULT NOTE - FOLLOW UP  Pharmacy Consult for acyclovir Indication: r/o HSV  Allergies  Allergen Reactions  . Morphine And Related Nausea Only  . Scopolamine Rash    Patient Measurements: Height: '5\' 7"'$  (170.2 cm) Weight: 92 lb 9.5 oz (42 kg) IBW/kg (Calculated) : 61.6   Vital Signs: Temp: 98.3 F (36.8 C) (04/26 0505) Temp Source: Oral (04/26 0505) BP: 105/47 mmHg (04/26 0505) Pulse Rate: 74 (04/26 0505) Intake/Output from previous day: 04/25 0701 - 04/26 0700 In: 589 [P.O.:480; IV Piggyback:109] Out: -  Intake/Output from this shift:    Labs:  Recent Labs  03/21/15 1456 03/21/15 1512 03/22/15 0815 03/23/15 0634  WBC 12.6*  --  10.0 6.0  HGB 13.8 15.6* 11.3* 8.9*  PLT 280  --  258 202  CREATININE 1.18* 1.10 0.93 0.91   Estimated Creatinine Clearance: 46.9 mL/min (by C-G formula based on Cr of 0.91). No results for input(s): VANCOTROUGH, VANCOPEAK, VANCORANDOM, GENTTROUGH, GENTPEAK, GENTRANDOM, TOBRATROUGH, TOBRAPEAK, TOBRARND, AMIKACINPEAK, AMIKACINTROU, AMIKACIN in the last 72 hours.   Microbiology: Recent Results (from the past 720 hour(s))  CSF culture     Status: None (Preliminary result)   Collection Time: 03/21/15  4:33 PM  Result Value Ref Range Status   Specimen Description CSF  Final   Special Requests NONE  Final   Gram Stain   Final    CYTOSPIN WBC PRESENT, PREDOMINANTLY MONONUCLEAR NO ORGANISMS SEEN Performed at [facility]    Culture   Final    NO GROWTH 1 DAY Performed at [facility]    Report Status PENDING  Incomplete  Gram stain     Status: None   Collection Time: 03/21/15  4:33 PM  Result Value Ref Range Status   Specimen Description CSF  Final   Special Requests TUBE 2  Final   Gram Stain NO WBC SEEN NO ORGANISMS SEEN   Final   Report Status 03/21/2015 FINAL  Final  Culture, blood (routine x 2)     Status: None (Preliminary result)   Collection Time: 03/21/15 10:31 PM  Result Value Ref Range  Status   Specimen Description BLOOD LEFT ARM  Final   Special Requests BOTTLES DRAWN AEROBIC AND ANAEROBIC 10 CC EACH  Final   Culture   Final           BLOOD CULTURE RECEIVED NO GROWTH TO DATE CULTURE WILL BE HELD FOR 5 DAYS BEFORE ISSUING A FINAL NEGATIVE REPORT Performed at [facility]    Report Status PENDING  Incomplete  Culture, blood (routine x 2)     Status: None (Preliminary result)   Collection Time: 03/21/15 10:40 PM  Result Value Ref Range Status   Specimen Description BLOOD RIGHT ARM  Final   Special Requests BOTTLES DRAWN AEROBIC AND ANAEROBIC 10CC EACH  Final   Culture   Final           BLOOD CULTURE RECEIVED NO GROWTH TO DATE CULTURE WILL BE HELD FOR 5 DAYS BEFORE ISSUING A FINAL NEGATIVE REPORT Performed at [facility]    Report Status PENDING  Incomplete    Anti-infectives    Start     Dose/Rate Route Frequency Ordered Stop   03/21/15 2300  acyclovir (ZOVIRAX) 450 mg in dextrose 5 % 100 mL IVPB     450 mg 109 mL/hr over 60 Minutes Intravenous Every 12 hours 03/21/15 2258        Assessment: 54yo female presents w/ AMS,  has h/o  metastatic melanoma w/ brain mets and subsequent negative MRIs at [facility]. MRI on 4/23 with possible encephalitis and acyclovir continues for possible infection (HSV results pending).  WBC= 6, afebrile, SCr= 0.91 and CrCl ~ 45.   4/23 acyclovir>>  4/23 CSF- ngtd 4/23 blood x2 - ngtd  Plan:  -No acyclovir dose changes needed -Will follow renal function, cultures and results of HSV  Hildred Laser, Pharm D 03/24/2015 7:07 AM

## 2015-03-24 NOTE — Progress Notes (Signed)
PATIENT DETAILS Name: [name] [name] Age: 54 y.o. Sex: female Date of Birth: 10-31-1961 Admit Date: 03/21/2015 Admitting Physician Ivor Costa, MD [id number], [name] Main, MD  Brief narrative: Patient is a 54 year old female with history of metastatic melanoma to the brain status post craniotomy-status post chemoradiation-last MRI brain on 3/30 done at [facility] showed no evidence of recurrent metastatic disease. Patient was admitted for altered mental status and headache. Lumbar puncture showed a unremarkable CSF, MRI brain showed edema involving the bilateral temporal and occipital regions. Patient was started on acyclovir, and admitted to the hospitalist service. Neurology continues to follow. Hospital course has been complicated by persistent headache, however altered mental status has completely resolved and patient back to baseline. Currently neurology recommends to continue acyclovir until HSV PCR from CSF comes back negative.  Subjective: Pt states her HA is still unchanged after the depakote. Continues to be present from when she awakens to when she goes to sleep. No complaints of confusion. Pt is tearful in room and states she just wants to know why she is having these HAs.   Assessment/Plan: Principle Problem:    Acute Encephalopathy: Etiology still unknown. MRI of brain shows abnormal T2/FLAIR signal intensity with edema involving predominantly the cortical gray matter of the bilateral temporal occipital regions. Underwent lumbar puncture-CSF fairly benign-but with elevated protein levels. Etiology remains unknown, neurology recommending to continue with IV acyclovir till HSV PCR comes back. Personally spoke with Dr. Janann Colonel on 4/24-he doubts PRES. EEG negative for seizures. Given history of metastatic melanoma-paraneoplastic syndrome always a consideration-neurology has ordered numerous serological tests (Anti-AMPA, anti-NMDA, anti-CRMP5, paraneoplastic  panel) which are currently pending. RMSF, Lyme and HIV titers all negative.   Active Problems: HA: Depakote provided no relief for patient. Awaiting Neurology consult today to discuss next steps in care.   Hypokalemia: Potassium 4.0 today, will continue IV hydration and monitoring.   Anemia: Likely secondary to acute illness, and IV fluid dilution. With no signs of bleeding will continue to monitor and provide fluids.   Anxiety: Stable at the time with no SI or HI. Continue Celexa.  Hypothyroidism: TSH is mildly elevated and Synthroid was recently increased. Repeat in outpatient setting in next 3 months.   History of melanoma, post status of craniotomy secondary to melanoma metastasized to brain: Follows with [facility] and gets radiation at [facility]. MRI of brain without signs of recurrence of metastatic disease.   Disposition: Remain inpatient  Antimicrobial agents  See below  Anti-infectives    Start     Dose/Rate Route Frequency Ordered Stop   03/21/15 2300  acyclovir (ZOVIRAX) 450 mg in dextrose 5 % 100 mL IVPB     450 mg 109 mL/hr over 60 Minutes Intravenous Every 12 hours 03/21/15 2258        DVT Prophylaxis: ProphylacticHeparin  Code Status: Full code  Family Communication None at bedside  Procedures: Lumbar puncture 4/23  CONSULTS:  neurology  MEDICATIONS: Scheduled Meds: . acyclovir  450 mg Intravenous Q12H  . citalopram  20 mg Oral q morning - 10a  . gabapentin  300 mg Oral QHS  . heparin  5,000 Units Subcutaneous 3 times per day  . levothyroxine  100 mcg Oral QAC breakfast  . methocarbamol (ROBAXIN)  IV  500 mg Intravenous 4 times per day  . pantoprazole  40 mg Oral Daily  . piroxicam  20 mg Oral Daily  .  sodium chloride  3 mL Intravenous Q12H   Continuous Infusions:  PRN Meds:.alum & mag hydroxide-simeth, hydrocortisone cream, LORazepam, traMADol    PHYSICAL EXAM: Vital signs in last 24 hours: Filed Vitals:   03/23/15  2050 03/24/15 0247 03/24/15 0505 03/24/15 0950  BP: 94/41 98/54 105/47 98/43  Pulse: 79 71 74 59  Temp: 98.6 F (37 C) 97.6 F (36.4 C) 98.3 F (36.8 C) 97.7 F (36.5 C)  TempSrc: Oral Oral Oral Oral  Resp: '16 18 16 18  '$ Height:      Weight:      SpO2: 100% 100% 100% 98%    Weight change:  Filed Weights   03/21/15 2246  Weight: 42 kg (92 lb 9.5 oz)   Body mass index is 14.5 kg/(m^2).   Gen Exam: Awake and alert with clear speech.   Neck: Supple, No JVD.   Chest: B/L Clear.   CVS: S1 S2 Regular, no murmurs.  Abdomen: soft, BS +, non tender, non distended.  Extremities: no edema, lower extremities warm to touch. Neurologic: Non Focal.   Skin: No Rash.   Wounds: N/A.    Intake/Output from previous day:  Intake/Output Summary (Last 24 hours) at 03/24/15 1047 Last data filed at 03/23/15 1700  Gross per 24 hour  Intake    360 ml  Output      0 ml  Net    360 ml   LAB RESULTS: CBC  Recent Labs Lab 03/21/15 1456 03/21/15 1512 03/22/15 0815 03/23/15 0634 03/24/15 0755  WBC 12.6*  --  10.0 6.0 4.8  HGB 13.8 15.6* 11.3* 8.9* 9.4*  HCT 41.1 46.0 33.8* 26.8* 28.1*  PLT 280  --  258 202 210  MCV 90.1  --  89.4 90.5 91.2  MCH 30.3  --  29.9 30.1 30.5  MCHC 33.6  --  33.4 33.2 33.5  RDW 13.4  --  13.5 13.8 13.7  LYMPHSABS 1.6  --   --   --   --   MONOABS 1.1*  --   --   --   --   EOSABS 0.2  --   --   --   --   BASOSABS 0.0  --   --   --   --     Chemistries   Recent Labs Lab 03/21/15 1456 03/21/15 1512 03/22/15 0815 03/23/15 0634 03/24/15 0755  NA 135 136 134* 132* 140  K 3.5 3.6 3.8 3.3* 4.0  CL 98 98 101 101 108  CO2 25  --  '22 23 22  '$ GLUCOSE 204* 204* 131* 68* 63*  BUN '13 15 11 7 '$ <5*  CREATININE 1.18* 1.10 0.93 0.91 0.87  CALCIUM 10.9*  --  9.6 8.9 9.7    CBG: No results for input(s): GLUCAP in the last 168 hours.  GFR Estimated Creatinine Clearance: 49 mL/min (by C-G formula based on Cr of 0.87).  Coagulation profile  Recent Labs Lab  03/21/15 1456  INR 1.20    Cardiac Enzymes No results for input(s): CKMB, TROPONINI, MYOGLOBIN in the last 168 hours.  Invalid input(s): CK  Invalid input(s): POCBNP No results for input(s): DDIMER in the last 72 hours.  Recent Labs  03/22/15 0815  HGBA1C 5.7*   No results for input(s): CHOL, HDL, LDLCALC, TRIG, CHOLHDL, LDLDIRECT in the last 72 hours.  Recent Labs  03/21/15 1706  TSH 7.001*    Recent Labs  03/21/15 1706  VITAMINB12 1407*   No results for input(s): LIPASE, AMYLASE in  the last 72 hours.  Urine Studies No results for input(s): UHGB, CRYS in the last 72 hours.  Invalid input(s): UACOL, UAPR, USPG, UPH, UTP, UGL, UKET, UBIL, UNIT, UROB, ULEU, UEPI, UWBC, URBC, UBAC, CAST, UCOM, BILUA  MICROBIOLOGY: Recent Results (from the past 240 hour(s))  CSF culture     Status: None (Preliminary result)   Collection Time: 03/21/15  4:33 PM  Result Value Ref Range Status   Specimen Description CSF  Final   Special Requests NONE  Final   Gram Stain   Final    CYTOSPIN WBC PRESENT, PREDOMINANTLY MONONUCLEAR NO ORGANISMS SEEN Performed at [facility]    Culture   Final    NO GROWTH 1 DAY Performed at [facility]    Report Status PENDING  Incomplete  Gram stain     Status: None   Collection Time: 03/21/15  4:33 PM  Result Value Ref Range Status   Specimen Description CSF  Final   Special Requests TUBE 2  Final   Gram Stain NO WBC SEEN NO ORGANISMS SEEN   Final   Report Status 03/21/2015 FINAL  Final  Culture, blood (routine x 2)     Status: None (Preliminary result)   Collection Time: 03/21/15 10:31 PM  Result Value Ref Range Status   Specimen Description BLOOD LEFT ARM  Final   Special Requests BOTTLES DRAWN AEROBIC AND ANAEROBIC 10 CC EACH  Final   Culture   Final           BLOOD CULTURE RECEIVED NO GROWTH TO DATE CULTURE WILL BE HELD FOR 5 DAYS BEFORE ISSUING A FINAL NEGATIVE REPORT Performed at [facility]    Report  Status PENDING  Incomplete  Culture, blood (routine x 2)     Status: None (Preliminary result)   Collection Time: 03/21/15 10:40 PM  Result Value Ref Range Status   Specimen Description BLOOD RIGHT ARM  Final   Special Requests BOTTLES DRAWN AEROBIC AND ANAEROBIC 10CC EACH  Final   Culture   Final           BLOOD CULTURE RECEIVED NO GROWTH TO DATE CULTURE WILL BE HELD FOR 5 DAYS BEFORE ISSUING A FINAL NEGATIVE REPORT Performed at [facility]    Report Status PENDING  Incomplete    RADIOLOGY STUDIES/RESULTS: Ct Angio Head W/cm &/or Wo Cm  03/21/2015   CLINICAL DATA:  Altered mental status. Woke up from nap CT at chronic. Code stroke.  EXAM: CT ANGIOGRAPHY HEAD  TECHNIQUE: Multidetector CT imaging of the head was performed using the standard protocol during bolus administration of intravenous contrast. Multiplanar CT image reconstructions and MIPs were obtained to evaluate the vascular anatomy.  CONTRAST:  10m OMNIPAQUE IOHEXOL 350 MG/ML SOLN  COMPARISON:  CT head without contrast from the same day. MRI brain 02/25/2015.  FINDINGS: CT HEAD  Brain: Postsurgical changes of the right cerebellum are again noted. No acute cortical infarct, hemorrhage, or mass lesion is present. There is no pathologic enhancement. The basal ganglia are intact. Insular cortex is intact.  Calvarium and skull base: Right occipital craniotomy is noted. The calvarium is otherwise intact.  Paranasal sinuses: Mild mucosal thickening is present in the anterior ethmoid air cells bilaterally and a left agger nasi cell  Orbits: Within normal limits  CTA HEAD  Anterior circulation: Minimal atherosclerotic calcifications are present within the cavernous internal carotid arteries. There are no significant stenoses. The ICA termini are normal bilaterally.  Anterior communicating artery is patent.  The MCA bifurcations are intact. ACA and MCA branch vessels are unremarkable.  Posterior circulation: The right vertebral artery is  slightly dominant to the left. PICA origins are visualized and normal. The vertebrobasilar junction is within normal limits. The basilar artery is unremarkable. The right posterior cerebral artery originates from the basilar tip. The left posterior cerebral artery is of fetal type. The PCA branch vessels are within normal limits.  Venous sinuses: The dural sinuses are patent. The right transverse sinus is dominant. The straight sinus is patent. The deep cerebral veins are patent.  Anatomic variants: Fetal type left posterior cerebral artery  IMPRESSION: 1. No acute abnormality. 2. Normal CTA circle of Willis without evidence for significant proximal stenosis, aneurysm, or branch vessel occlusion. 3. Postoperative changes of the right cerebellum. These results were called by telephone at the time of interpretation on 03/21/2015 at 3:39pm to Dr. Jim Like , who verbally acknowledged these results.   Electronically Signed   By: San Morelle M.D.   On: 03/21/2015 16:03   Ct Head Wo Contrast  03/21/2015   CLINICAL DATA:  Altered mental status.  Code stroke.  EXAM: CT HEAD WITHOUT CONTRAST  TECHNIQUE: Contiguous axial images were obtained from the base of the skull through the vertex without intravenous contrast.  COMPARISON:  MRI 02/25/2015.  FINDINGS: RIGHT suboccipital craniectomy with encephalomalacia in the RIGHT cerebellar hemisphere.  No mass lesion, mass effect, midline shift, hydrocephalus, hemorrhage. No territorial ischemia or acute infarction. Small areas of thickening are present within the posterior maxillary sinuses which may represent fluid levels which can be seen in acute sinusitis. This could also represent ordinary mucosal thickening which is partially visible. Opacification of the frontal sinuses and partial opacification of the ethmoid air cells.  IMPRESSION: 1. No acute intracranial abnormality. 2. RIGHT suboccipital craniectomy with encephalomalacia from previous RIGHT cerebellar  hemisphere resection. 3. Critical Value/emergent results were called by telephone at the time of interpretation on 03/21/2015 at 2:53 pm to Dr. Jerene Canny , who verbally acknowledged these results.   Electronically Signed   By: Dereck Ligas M.D.   On: 03/21/2015 14:54   Mr Jeri Cos VP Contrast  03/21/2015   CLINICAL DATA:  Initial evaluation for acute altered mental status, confusion. History of metastatic melanoma. Most recent brain MRIs have been negative for metastatic disease.  EXAM: MRI HEAD WITHOUT AND WITH CONTRAST  TECHNIQUE: Multiplanar, multiecho pulse sequences of the brain and surrounding structures were obtained without and with intravenous contrast.  CONTRAST:  93m MULTIHANCE GADOBENATE DIMEGLUMINE 529 MG/ML IV SOLN  COMPARISON:  Prior CT from earlier the same day as well as previous brain MRI from 02/25/2015.  FINDINGS: Postoperative changes from prior right suboccipital craniotomy again seen. There is encephalomalacia within the right cerebellar hemisphere, similar to previous. No mass lesion or abnormal enhancement identified to suggest intracranial metastasis.  There is abnormal T2/FLAIR signal intensity with edema involving predominantly the cortical gray matter of the bilateral temporal occipital regions, right greater than left. There is involvement of the mesial right temporal lobe, although there is suggestion of probable increased signal intensity within the mesial left temporal lobe as well. The right hippocampus appears swollen with increased T2/FLAIR signal intensity. Right fornix is involved. No associated abnormal enhancement on post-contrast sequences.  No mass lesion or midline shift. No hydrocephalus. No extra-axial fluid collection.  Craniocervical junction within normal limits. Pituitary gland normal. No acute abnormality about the orbits.  Small amount layering opacity present within the left maxillary  sinus. There is scattered mucosal thickening within the  ethmoidal air cells. Frontal sinus partially opacified. Scattered fluid signal intensity present within the mastoid air cells bilaterally. Inner ear structures not well evaluated on this exam.  No acute large vessel territory infarct. No intracranial hemorrhage. Gray-white matter differentiation maintained. Normal intravascular flow voids preserved.  Mild degenerative changes noted within the upper cervical spine. Bone marrow signal intensity normal. Scalp soft tissues within normal limits.  IMPRESSION: 1. Abnormal T2/FLAIR signal intensity with edema involving predominantly the cortical gray matter of the bilateral temporal occipital regions, right greater than left, with prominent involvement of the mesial right temporal lobe and hippocampus, although there is more mild abnormal signal intensity within the mesial left temporal lobe as well. No associated enhancement. Findings are of uncertain etiology. Differential considerations include possible postictal changes, PRES, or possibly limbic encephalitis. Possible infectious etiology such as herpes encephalitis could also be considered. Correlation with CSF may be helpful for further evaluation. 2. Postoperative changes from prior right suboccipital craniotomy with right cerebellar encephalomalacia. No intracranial mass lesion or abnormal enhancement to suggest metastatic disease identified. 3. No other acute intracranial process identified.   Electronically Signed   By: Jeannine Boga M.D.   On: 03/21/2015 22:24   Mr [employer] Films Head/face  03/16/2015   CLINICAL DATA:    This exam is stored here for comparison purposes only and was performed at  an outside facility.   Please contact the originating institution for any  associated interpretation or report.    Damaris Hippo, PA-S Oren Binet, MD [facility] Pager:[phone]  If 7PM-7AM, please contact night-coverage [email or web address] Password [facility] 03/24/2015, 10:47 AM   LOS: 3 days    Attending Patient was seen, examined,treatment plan was discussed with the  Advance Practice Provider.  I have directly reviewed the clinical findings, lab, imaging studies and management of this patient in detail. I have made the necessary changes to the above noted documentation, and agree with the documentation  Mental status is improving back to baseline, continues to headache. Awaiting HSV PCR-on IV acyclovir. Awaiting further serologic studies as ordered by neurology. Rest as above  Nena Alexander MD [facility].

## 2015-03-24 NOTE — Progress Notes (Signed)
Occupational Therapy Treatment Patient Details Name: [name] [name] MRN: [id number] DOB: 1961/04/14 Today's Date: 03/24/2015    History of present illness 54 y.o. female with past medical history of GERD, hypothyroidism, anxiety, melanoma, brain metastases of melanoma (post status of craniotomy), TMJ, who presents with altered mental status.Per chart MRI  Shows abnormal FLAIR signal intensity in bilateral temporal occipital regions R>L with prominent involvement of the mesial right temporal lobe and hippocampus.    OT comments  Pt demonstrates high fall risk and decr cognition. Pt will require 24/7 (A) upon d/c home to d/c safely. Pt can not be home alone. If pt does not have 24/7 (A) will need higher level of care. Pt with poor short term memory and liable when errors made aware to patient.    Follow Up Recommendations  Home health OT;Supervision/Assistance - 24 hour    Equipment Recommendations  None recommended by OT    Recommendations for Other Services      Precautions / Restrictions Precautions Precautions: Fall       Mobility Bed Mobility Overal bed mobility: Needs Assistance Bed Mobility: Sit to Supine       Sit to supine: Supervision   General bed mobility comments: cues for safety with bed alarm set  Transfers Overall transfer level: Needs assistance Equipment used: 1 person hand held assist Transfers: Sit to/from Stand Sit to Stand: Min assist         General transfer comment: (a) for safety and IV pole    Balance Overall balance assessment: Needs assistance         Standing balance support: No upper extremity supported;During functional activity Standing balance-Leahy Scale: Poor Standing balance comment: side stepping with head turn during ambulation                   ADL Overall ADL's : Needs assistance/impaired Eating/Feeding: Modified independent                       Toilet Transfer: Minimal assistance Toilet Transfer  Details (indicate cue type and reason): cues for safety and OT (A) for the IV pole         Functional mobility during ADLs: Min guard General ADL Comments: Pt impulsive and needs mod v/c for safety with mobility. Pt with no awareness to IV pole or iV line. Pt needed cues to prevent d/c of IV during session. pt allowing bed to sound and does not stop for bed alarm sound.       Vision                     Perception     Praxis      Cognition   Behavior During Therapy: Flat affect;Impulsive Overall Cognitive Status: Impaired/Different from baseline Area of Impairment: Awareness;Safety/judgement;Problem solving          Safety/Judgement: Decreased awareness of safety;Decreased awareness of deficits Awareness: Intellectual Problem Solving: Slow processing;Decreased initiation;Difficulty sequencing General Comments: Pt standing on opposite side of the bed with bed alarm sounding on arrival. Pt reports "i feel like i am going to throw up" Pt needing cues to progress to bathroom toilet. Pt without nausea demonstrated.     Extremity/Trunk Assessment               Exercises     Shoulder Instructions       General Comments      Pertinent Vitals/ Pain       Pain Assessment:  Faces Pain Score: 10-Worst pain ever Pain Location: HA Pain Descriptors / Indicators: Headache Pain Intervention(s): Monitored during session;Repositioned  Home Living                                          Prior Functioning/Environment              Frequency Min 2X/week     Progress Toward Goals  OT Goals(current goals can now be found in the care plan section)  Progress towards OT goals: Progressing toward goals  Acute Rehab OT Goals Patient Stated Goal: to not be in pain OT Goal Formulation: With patient/family Time For Goal Achievement: 04/05/15 Potential to Achieve Goals: Good ADL Goals Pt Will Transfer to Toilet: with modified  independence;ambulating Pt Will Perform Toileting - Clothing Manipulation and hygiene: with modified independence;sit to/from stand Additional ADL Goal #1: Demonstrate emergent awareness during ADL task in minimally distracting environment  Plan Discharge plan remains appropriate    Co-evaluation                 End of Session     Activity Tolerance Patient limited by pain   Patient Left in bed;with call bell/phone within reach;with bed alarm set   Nurse Communication Mobility status;Precautions        Time: 7408-1448 OT Time Calculation (min): 8 min  Charges: OT General Charges $OT Visit: 1 Procedure OT Treatments $Self Care/Home Management : 8-22 mins  Parke Poisson B 03/24/2015, 10:28 AM Pager: [phone]

## 2015-03-24 NOTE — Progress Notes (Signed)
Subjective: Continues to have a HA rated 4/10.  Willing to wait one hour for her scheduled pain medications. Perseverating on her THC histroy and on her Dx of hyperemesis.   Objective: Current vital signs: BP 98/43 mmHg  Pulse 59  Temp(Src) 97.7 F (36.5 C) (Oral)  Resp 18  Ht '5\' 7"'$  (1.702 m)  Wt 42 kg (92 lb 9.5 oz)  BMI 14.50 kg/m2  SpO2 98% Vital signs in last 24 hours: Temp:  [97.6 F (36.4 C)-98.8 F (37.1 C)] 97.7 F (36.5 C) (04/26 0950) Pulse Rate:  [59-84] 59 (04/26 0950) Resp:  [16-18] 18 (04/26 0950) BP: (94-107)/(41-67) 98/43 mmHg (04/26 0950) SpO2:  [98 %-100 %] 98 % (04/26 0950)  Intake/Output from previous day: 04/25 0701 - 04/26 0700 In: 589 [P.O.:480; IV Piggyback:109] Out: -  Intake/Output this shift:   Nutritional status: Diet regular Room service appropriate?: Yes; Fluid consistency:: Thin  CSF HSV results are pending. Anti-AMPA, anti-NMDA, anti-CRMP5, paraneoplastic panel results also still pending.   Neurologic Exam:  Mental Status: Alert, oriented, thought content appropriate.  Speech fluent without evidence of aphasia.  Able to follow 3 step commands without difficulty. Cranial Nerves: II: Discs flat bilaterally; Visual fields grossly normal, pupils equal, round, reactive to light and accommodation III,IV, VI: ptosis not present, extra-ocular motions intact bilaterally V,VII: smile symmetric, facial light touch sensation normal bilaterally VIII: hearing normal bilaterally IX,X: uvula rises symmetrically XI: bilateral shoulder shrug XII: midline tongue extension without atrophy or fasciculations  Motor: Right : Upper extremity   5/5    Left:     Upper extremity   5/5  Lower extremity   5/5     Lower extremity   5/5 Tone and bulk:normal tone throughout; no atrophy noted Sensory: Pinprick and light touch intact throughout, bilaterally Deep Tendon Reflexes:  Right: Upper Extremity   Left: Upper extremity   biceps (C-5 to C-6) 2/4   biceps  (C-5 to C-6) 2/4 tricep (C7) 2/4    triceps (C7) 2/4 Brachioradialis (C6) 2/4  Brachioradialis (C6) 2/4  Lower Extremity Lower Extremity  quadriceps (L-2 to L-4) 2/4   quadriceps (L-2 to L-4) 2/4 Achilles (S1) 2/4   Achilles (S1) 2/4  Plantars: Right: downgoing   Left: downgoing    Lab Results: Basic Metabolic Panel:  Recent Labs Lab 03/21/15 1456 03/21/15 1512 03/22/15 0815 03/23/15 0634 03/24/15 0755  NA 135 136 134* 132* 140  K 3.5 3.6 3.8 3.3* 4.0  CL 98 98 101 101 108  CO2 25  --  '22 23 22  '$ GLUCOSE 204* 204* 131* 68* 63*  BUN '13 15 11 7 '$ <5*  CREATININE 1.18* 1.10 0.93 0.91 0.87  CALCIUM 10.9*  --  9.6 8.9 9.7    Liver Function Tests:  Recent Labs Lab 03/21/15 1456 03/22/15 1543  AST 31 20  ALT 19 12  ALKPHOS 75 56  BILITOT 1.0 1.3*  PROT 7.1 5.5*  ALBUMIN 4.2 3.2*   No results for input(s): LIPASE, AMYLASE in the last 168 hours.  Recent Labs Lab 03/21/15 1706 03/22/15 1543  AMMONIA 77* 32    CBC:  Recent Labs Lab 03/21/15 1456 03/21/15 1512 03/22/15 0815 03/23/15 0634 03/24/15 0755  WBC 12.6*  --  10.0 6.0 4.8  NEUTROABS 9.7*  --   --   --   --   HGB 13.8 15.6* 11.3* 8.9* 9.4*  HCT 41.1 46.0 33.8* 26.8* 28.1*  MCV 90.1  --  89.4 90.5 91.2  PLT 280  --  258 202 210    Cardiac Enzymes: No results for input(s): CKTOTAL, CKMB, CKMBINDEX, TROPONINI in the last 168 hours.  Lipid Panel: No results for input(s): CHOL, TRIG, HDL, CHOLHDL, VLDL, LDLCALC in the last 168 hours.  CBG: No results for input(s): GLUCAP in the last 168 hours.  Microbiology: Results for orders placed or performed during the hospital encounter of 03/21/15  CSF culture     Status: None (Preliminary result)   Collection Time: 03/21/15  4:33 PM  Result Value Ref Range Status   Specimen Description CSF  Final   Special Requests NONE  Final   Gram Stain   Final    CYTOSPIN WBC PRESENT, PREDOMINANTLY MONONUCLEAR NO ORGANISMS SEEN Performed at [facility]    Culture   Final    NO GROWTH 1 DAY Performed at [facility]    Report Status PENDING  Incomplete  Gram stain     Status: None   Collection Time: 03/21/15  4:33 PM  Result Value Ref Range Status   Specimen Description CSF  Final   Special Requests TUBE 2  Final   Gram Stain NO WBC SEEN NO ORGANISMS SEEN   Final   Report Status 03/21/2015 FINAL  Final  Culture, blood (routine x 2)     Status: None (Preliminary result)   Collection Time: 03/21/15 10:31 PM  Result Value Ref Range Status   Specimen Description BLOOD LEFT ARM  Final   Special Requests BOTTLES DRAWN AEROBIC AND ANAEROBIC 10 CC EACH  Final   Culture   Final           BLOOD CULTURE RECEIVED NO GROWTH TO DATE CULTURE WILL BE HELD FOR 5 DAYS BEFORE ISSUING A FINAL NEGATIVE REPORT Performed at [facility]    Report Status PENDING  Incomplete  Culture, blood (routine x 2)     Status: None (Preliminary result)   Collection Time: 03/21/15 10:40 PM  Result Value Ref Range Status   Specimen Description BLOOD RIGHT ARM  Final   Special Requests BOTTLES DRAWN AEROBIC AND ANAEROBIC 10CC EACH  Final   Culture   Final           BLOOD CULTURE RECEIVED NO GROWTH TO DATE CULTURE WILL BE HELD FOR 5 DAYS BEFORE ISSUING A FINAL NEGATIVE REPORT Performed at [facility]    Report Status PENDING  Incomplete    Coagulation Studies:  Recent Labs  03/21/15 1456  LABPROT 15.4*  INR 1.20    Imaging: No results found.  Medications:  Scheduled: . acyclovir  450 mg Intravenous Q12H  . citalopram  20 mg Oral q morning - 10a  . gabapentin  300 mg Oral QHS  . heparin  5,000 Units Subcutaneous 3 times per day  . levothyroxine  100 mcg Oral QAC breakfast  . methocarbamol (ROBAXIN)  IV  500 mg Intravenous 4 times per day  . pantoprazole  40 mg Oral Daily  . piroxicam  20 mg Oral Daily  . sodium chloride  3 mL Intravenous Q12H    Assessment/Plan: Etiology of AMS and MRI remain unclear. Patient  is back to her baseline. HA remains a 4/10 and constant but relieved with medication. Robaxin/Ultram and Toradol are helping. Recommend Continue Acyclovir until HSV result are back.   CSF HSV results are pending. Anti-AMPA, anti-NMDA, anti-CRMP5, paraneoplastic panel results also still pending.   Etta Quill PA-C [facility] [phone]   I personally participated in this patient's evaluation and management,  including formulating the above clinical impression and management recommendations.  Rush Farmer M.D. [facility] [phone]  03/24/2015, 10:25 AM

## 2015-03-25 ENCOUNTER — Ambulatory Visit: Payer: 59 | Admitting: Radiation Oncology

## 2015-03-25 DIAGNOSIS — R69 Illness, unspecified: Secondary | ICD-10-CM

## 2015-03-25 LAB — B. BURGDORFI ANTIBODIES, CSF

## 2015-03-25 LAB — CSF CULTURE

## 2015-03-25 LAB — CSF CULTURE W GRAM STAIN: Culture: NO GROWTH

## 2015-03-25 NOTE — Progress Notes (Signed)
Physical Therapy Treatment Patient Details Name: [name] [name] MRN: [id number] DOB: Apr 24, 1961 Today's Date: 03/25/2015    History of Present Illness 54 y.o. female with past medical history of GERD, hypothyroidism, anxiety, melanoma, brain metastases of melanoma (post status of craniotomy), TMJ, who presents with altered mental status.Per chart MRI  Shows abnormal FLAIR signal intensity in bilateral temporal occipital regions R>L with prominent involvement of the mesial right temporal lobe and hippocampus.     PT Comments    Pt demonstrated improved cognition and functional abilities today.  Pt demonstrated improved ability to follow simple commands, improved sequencing and overall required less v/c's during treatment.  Pt demonstrated supervision level function with bed mobility and transfers and min guard level function with ambulation and stairs, with improved awareness of deficits and safety.  Current plan for d/c home with 24 hour supervision and home health PT remains appropriate once pt is medically cleared.  Acute PT to follow acutely to address remaining deficits.    Follow Up Recommendations  Supervision/Assistance - 24 hour;Home health PT     Equipment Recommendations  Rolling walker with 5" wheels    Recommendations for Other Services       Precautions / Restrictions Precautions Precautions: Fall Restrictions Weight Bearing Restrictions: No    Mobility  Bed Mobility Overal bed mobility: Needs Assistance Bed Mobility: Supine to Sit       Sit to supine: Supervision   General bed mobility comments:  (Demonstrated safe bed mobility today)  Transfers Overall transfer level: Needs assistance Equipment used: None Transfers: Sit to/from Stand Sit to Stand: Supervision         General transfer comment: Pt able to demonstrate sit to stand with supervision for safety.  PT assisted with IV pole management.   Ambulation/Gait Ambulation/Gait assistance: Min  guard Ambulation Distance (Feet): 550 Feet Assistive device: None Gait Pattern/deviations: Step-through pattern Gait velocity: Decreased Gait velocity interpretation: Below normal speed for age/gender General Gait Details: Pt demonstrated improved gait mechanics, was able to tolerate ambulation with no HHA and was min guard for safety only.  Pt able to ambulate 75 feet with only supervision, but PT close for support if necessary, and PT managing IV pole.  Attempted gait with cane, steadier mechanics noted with no AD due to complexity of sequencing with AD.  Pt was able to acknowledge left during ambulation (turn left, look to your left to see me) without LOB.    Stairs Stairs: Yes Stairs assistance: Min guard Stair Management: No rails Number of Stairs: 5 General stair comments: Pt demonstrated improved safety awareness with stair navigation.  Pt was able to demonstrate safe navigation of 5 stairs with no hand rails and PT only min guard for safety and management of IV pole.   Wheelchair Mobility    Modified Rankin (Stroke Patients Only)       Balance Overall balance assessment: Needs assistance         Standing balance support: No upper extremity supported;During functional activity Standing balance-Leahy Scale: Fair Standing balance comment: Pt demonstrated improved standing balance today, and improved steadiness with gait.                     Cognition Arousal/Alertness: Awake/alert Behavior During Therapy: Flat affect;WFL for tasks assessed/performed Overall Cognitive Status: Impaired/Different from baseline Area of Impairment: Awareness;Safety/judgement;Problem solving   Current Attention Level: Selective   Following Commands: Follows multi-step commands inconsistently;Follows one step commands consistently Safety/Judgement: Decreased awareness of safety Awareness: Emergent Problem  Solving: Requires verbal cues;Difficulty sequencing General Comments: Pt  demonstrated improved cognition today.  Pt was able to follow one step commands consistently, and demonstrated improved time with sequencing and multistep commands.     Exercises      General Comments General comments (skin integrity, edema, etc.): Improved ability to acknowledge left side during ambulation, decreased symptoms with head turning while walking today.       Pertinent Vitals/Pain Pain Assessment: 0-10 Pain Score: 8  Pain Location: HA Pain Descriptors / Indicators: Headache Pain Intervention(s): Monitored during session    Home Living                      Prior Function            PT Goals (current goals can now be found in the care plan section) Progress towards PT goals: Progressing toward goals    Frequency  Min 3X/week    PT Plan Current plan remains appropriate    Co-evaluation             End of Session Equipment Utilized During Treatment: Gait belt Activity Tolerance: Patient tolerated treatment well Patient left: in bed;with bed alarm set;with call bell/phone within reach     Time: 0842-0903 PT Time Calculation (min) (ACUTE ONLY): 21 min  Charges:  $Gait Training: 8-22 mins                    G Codes:      [name] [name] 04-19-2015, 12:10 PM  Lucas Mallow, SPT (student physical therapist) Office phone: [phone]

## 2015-03-25 NOTE — Progress Notes (Signed)
Subjective: patient resting comfortably in room with TV and lights on.  In no distress but stating her HA is 8/10. She is requesting Tylenol for pain. Patient also stated she is ready to go home when MD feels it is ok.   Objective: Current vital signs: BP 118/73 mmHg  Pulse 77  Temp(Src) 98.4 F (36.9 C) (Oral)  Resp 18  Ht '5\' 7"'$  (1.702 m)  Wt 42 kg (92 lb 9.5 oz)  BMI 14.50 kg/m2  SpO2 100% Vital signs in last 24 hours: Temp:  [97.9 F (36.6 C)-98.7 F (37.1 C)] 98.4 F (36.9 C) (04/27 0532) Pulse Rate:  [70-85] 77 (04/27 0532) Resp:  [18] 18 (04/27 0532) BP: (106-124)/(46-74) 118/73 mmHg (04/27 0532) SpO2:  [100 %] 100 % (04/27 0532)  Intake/Output from previous day: 04/26 0701 - 04/27 0700 In: 923 [P.O.:720; I.V.:3; IV Piggyback:200] Out: -  Intake/Output this shift:   Nutritional status: Diet regular Room service appropriate?: Yes; Fluid consistency:: Thin  Neurologic Exam: Mental Status: Alert, oriented, thought content appropriate. Speech fluent without evidence of aphasia. Able to follow 3 step commands without difficulty. Cranial Nerves: II: Discs flat bilaterally; Visual fields grossly normal, pupils equal, round, reactive to light and accommodation III,IV, VI: ptosis not present, extra-ocular motions intact bilaterally V,VII: smile symmetric, facial light touch sensation normal bilaterally VIII: hearing normal bilaterally IX,X: uvula rises symmetrically XI: bilateral shoulder shrug XII: midline tongue extension without atrophy or fasciculations  Motor: Right :Upper extremity 5/5Left: Upper extremity 5/5 Lower extremity 5/5Lower extremity 5/5 Tone and bulk:normal tone throughout; no atrophy noted Sensory: Pinprick and light touch intact throughout, bilaterally Deep Tendon Reflexes:  Right: Upper Extremity Left: Upper  extremity   biceps (C-5 to C-6) 2/4 biceps (C-5 to C-6) 2/4 tricep (C7) 2/4triceps (C7) 2/4 Brachioradialis (C6) 2/4Brachioradialis (C6) 2/4  Lower Extremity Lower Extremity  quadriceps (L-2 to L-4) 2/4 quadriceps (L-2 to L-4) 2/4 Achilles (S1) 2/4Achilles (S1) 2/4  Plantars: Right: downgoingLeft: downgoing   Lab Results: Basic Metabolic Panel:  Recent Labs Lab 03/21/15 1456 03/21/15 1512 03/22/15 0815 03/23/15 0634 03/24/15 0755  NA 135 136 134* 132* 140  K 3.5 3.6 3.8 3.3* 4.0  CL 98 98 101 101 108  CO2 25  --  '22 23 22  '$ GLUCOSE 204* 204* 131* 68* 63*  BUN '13 15 11 7 '$ <5*  CREATININE 1.18* 1.10 0.93 0.91 0.87  CALCIUM 10.9*  --  9.6 8.9 9.7    Liver Function Tests:  Recent Labs Lab 03/21/15 1456 03/22/15 1543  AST 31 20  ALT 19 12  ALKPHOS 75 56  BILITOT 1.0 1.3*  PROT 7.1 5.5*  ALBUMIN 4.2 3.2*   No results for input(s): LIPASE, AMYLASE in the last 168 hours.  Recent Labs Lab 03/21/15 1706 03/22/15 1543  AMMONIA 77* 32    CBC:  Recent Labs Lab 03/21/15 1456 03/21/15 1512 03/22/15 0815 03/23/15 0634 03/24/15 0755  WBC 12.6*  --  10.0 6.0 4.8  NEUTROABS 9.7*  --   --   --   --   HGB 13.8 15.6* 11.3* 8.9* 9.4*  HCT 41.1 46.0 33.8* 26.8* 28.1*  MCV 90.1  --  89.4 90.5 91.2  PLT 280  --  258 202 210    Cardiac Enzymes: No results for input(s): CKTOTAL, CKMB, CKMBINDEX, TROPONINI in the last 168 hours.  Lipid Panel: No results for input(s): CHOL, TRIG, HDL, CHOLHDL, VLDL, LDLCALC in the last 168 hours.  CBG: No results for input(s): GLUCAP in the  last 168 hours.  Microbiology: Results for orders placed or performed during the hospital encounter of 03/21/15  CSF culture     Status: None (Preliminary result)   Collection Time: 03/21/15  4:33 PM  Result Value Ref Range Status    Specimen Description CSF  Final   Special Requests NONE  Final   Gram Stain   Final    CYTOSPIN WBC PRESENT, PREDOMINANTLY MONONUCLEAR NO ORGANISMS SEEN Performed at [facility]    Culture   Final    NO GROWTH 2 DAYS Performed at [facility]    Report Status PENDING  Incomplete  Gram stain     Status: None   Collection Time: 03/21/15  4:33 PM  Result Value Ref Range Status   Specimen Description CSF  Final   Special Requests TUBE 2  Final   Gram Stain NO WBC SEEN NO ORGANISMS SEEN   Final   Report Status 03/21/2015 FINAL  Final  Culture, blood (routine x 2)     Status: None (Preliminary result)   Collection Time: 03/21/15 10:31 PM  Result Value Ref Range Status   Specimen Description BLOOD LEFT ARM  Final   Special Requests BOTTLES DRAWN AEROBIC AND ANAEROBIC 10 CC EACH  Final   Culture   Final           BLOOD CULTURE RECEIVED NO GROWTH TO DATE CULTURE WILL BE HELD FOR 5 DAYS BEFORE ISSUING A FINAL NEGATIVE REPORT Performed at [facility]    Report Status PENDING  Incomplete  Culture, blood (routine x 2)     Status: None (Preliminary result)   Collection Time: 03/21/15 10:40 PM  Result Value Ref Range Status   Specimen Description BLOOD RIGHT ARM  Final   Special Requests BOTTLES DRAWN AEROBIC AND ANAEROBIC 10CC EACH  Final   Culture   Final           BLOOD CULTURE RECEIVED NO GROWTH TO DATE CULTURE WILL BE HELD FOR 5 DAYS BEFORE ISSUING A FINAL NEGATIVE REPORT Performed at [facility]    Report Status PENDING  Incomplete    Coagulation Studies: No results for input(s): LABPROT, INR in the last 72 hours.  Imaging: No results found.  Medications:  Scheduled: . acyclovir  450 mg Intravenous Q12H  . citalopram  20 mg Oral q morning - 10a  . gabapentin  300 mg Oral QHS  . heparin  5,000 Units Subcutaneous 3 times per day  . levothyroxine  100 mcg Oral QAC breakfast  . methocarbamol (ROBAXIN)  IV  500 mg Intravenous 4 times per  day  . pantoprazole  40 mg Oral Daily  . piroxicam  20 mg Oral Daily  . sodium chloride  3 mL Intravenous Q12H    Assessment/Plan: Etiology of AMS and MRI remain unclear. Patient is back to her baseline. HA remains a 8/10 and constant, however patient is in no distress. Patient feels medication  Robaxin/Ultram and Toradol are helping. HSV, RMSF and lyme titers are negative.    Anti-AMPA, anti-NMDA, anti-CRMP5, paraneoplastic panel results  still pending  Recommend: 1) may D/C Acyclovir a HSV titers are negative 2) D/C Robaxin 3) Follow up lab results as out patient with neurology in 2 weeks.   Neurology will S/O  Etta Quill PA-C [facility] [phone]  03/25/2015, 10:33 AM  I personally participated in this patient's evaluation and management, including fluid in the above clinical impression and management recommendations.  Rush Farmer M.D. [facility]  [phone] 

## 2015-03-25 NOTE — Progress Notes (Signed)
Reviewed discharge instructions with patient and spouse. Advised to follow up with [facility] in 2 weeks. Pt escorted to lobby at 1310 and transported to home in [place] by spouse.

## 2015-03-25 NOTE — Discharge Summary (Signed)
Physician Discharge Summary  [name] [name]:[id number] DOB: February 05, 1961 DOA: 03/21/2015  PCP: Orpah Melter, MD  Admit date: 03/21/2015 Discharge date: 03/25/2015  Time spent: 35 minutes  Recommendations for Outpatient Follow-up:  1. Please follow-up on patient's acute encephalopathy, the of discharge symptoms resolving, suspect could be related to paraneoplastic syndrome. 2. Please follow-up on Anti-AMPA, anti-NMDA, anti-CRMP5, paraneoplastic panel  Discharge Diagnoses:  Principal Problem:   Acute encephalopathy Active Problems:   Hypothyroid   Lung nodule   S/P craniotomy   Solitary brain metastasis from melanoma   Protein-calorie malnutrition, severe   Metastatic melanoma to lung   GERD (gastroesophageal reflux disease)   Anxiety   Dementia   Polypharmacy   Other headache syndrome   Discharge Condition: Stable  Diet recommendation: Regular diet  Filed Weights   03/21/15 2246  Weight: 42 kg (92 lb 9.5 oz)    History of present illness:  [name] [name] is a 54 y.o. female with past medical history of GERD, hypothyroidism, anxiety, melanoma, brain metastases of melanoma (post status of craniotomy), TMJ, who presents with altered mental status.  Patient reports that after she had a nap she woke up with confusion. Per his husband, pt had difficulty talking, not always making sense. She had intermittent periods where she would become lucid and then would become nonverbal with eyes staring off, both to the right and to the left. She states that she had camping for two days and was returning home from her trip today. She has chronic nausea and vomiting. She had diarrhea yesterday, but no symptoms today. Of note, patient has multiple sedative medications on her list, including compazine, Phenergan, Reglan, Ativan, Zofran and tramadol. She said she has not taken Phenergan recently, but taking all other medications most likely.  ROS: currently patient denies fever, chills,  running nose, ear pain, headaches, cough, chest pain, SOB, abdominal pain, diarrhea, constipation, dysuria, urgency, frequency, hematuria, skin rashes, joint pain or leg swelling. No unilateral weakness, numbness or tingling sensations. No vision change or hearing loss.  In ED, patient was found to have negative CT-head and CTA head for acute abnormalities. Lipase is 12.6, temperature 97.6, heart rate 127, electrolytes okay. UDS is positive for TSH and benzo. INR 1.20, negative troponin, negative alcohol level,. Neurology was consulted by ED, lumbar puncture were performed per Dr. Marin Comment. The initial CSF analysis is negative.  Hospital Course:  Patient is a 54 year old female with history of metastatic melanoma to the brain status post craniotomy-status post chemoradiation-last MRI brain on 3/30 done at [facility] showed no evidence of recurrent metastatic disease. Patient was admitted for altered mental status and headache. Lumbar puncture showed a unremarkable CSF, MRI brain showed edema involving the bilateral temporal and occipital regions. Patient was started on acyclovir, and admitted to the hospitalist service. Neurology continues to follow. Hospital course has been complicated by persistent headache, however altered mental status has completely resolved and patient back to baseline. HSV PCR from CSF came back negative. Case discussed with neurology, felt that acute encephalopathy could be related to a paraneoplastic syndrome with her history of metastatic melanoma. RMSF, Lyme and HIV coming back negative. She was discharged in stable condition on 03/25/2015, set up to follow up with Neurology in 2 weeks.    Consultations:  Neurology  Discharge Exam: Filed Vitals:   03/25/15 1101  BP: 121/70  Pulse: 65  Temp: 98.3 F (36.8 C)  Resp: 18   Gen Exam: Awake and alert with clear speech.  Neck: Supple, No JVD.  Chest: B/L Clear.  CVS: S1 S2 Regular, no murmurs.   Abdomen: soft, BS +, non tender, non distended.  Extremities: no edema, lower extremities warm to touch. Neurologic: Non Focal.  Skin: No Rash.  Wounds: N/A.   Discharge Instructions   Discharge Instructions    Call MD for:  difficulty breathing, headache or visual disturbances    Complete by:  As directed      Call MD for:  extreme fatigue    Complete by:  As directed      Call MD for:  hives    Complete by:  As directed      Call MD for:  persistant dizziness or light-headedness    Complete by:  As directed      Call MD for:  persistant nausea and vomiting    Complete by:  As directed      Call MD for:  redness, tenderness, or signs of infection (pain, swelling, redness, odor or green/yellow discharge around incision site)    Complete by:  As directed      Call MD for:  severe uncontrolled pain    Complete by:  As directed      Call MD for:  temperature >100.4    Complete by:  As directed      Diet - low sodium heart healthy    Complete by:  As directed      Increase activity slowly    Complete by:  As directed           Discharge Medication List as of 03/25/2015 12:48 PM    CONTINUE these medications which have NOT CHANGED   Details  citalopram (CELEXA) 20 MG tablet Take 20 mg by mouth every morning. , Starting 07/14/2014, Until Tue 07/14/15, Historical Med    esomeprazole (NEXIUM) 20 MG capsule Take 20 mg by mouth daily at 12 noon., Until Discontinued, Historical Med    gabapentin (NEURONTIN) 300 MG capsule Take 300 mg by mouth at bedtime., Until Discontinued, Historical Med    hydrocortisone 2.5 % cream Apply 1 application topically as needed (for rash., itching)., Until Discontinued, Historical Med    levothyroxine (SYNTHROID, LEVOTHROID) 100 MCG tablet Take 100 mcg by mouth daily before breakfast., Until Discontinued, Historical Med    LORazepam (ATIVAN) 1 MG tablet Take 1 tablet (1 mg total) by mouth every 12 (twelve) hours as needed for anxiety or sleep.,  Starting 05/03/2014, Until Discontinued, Print    metoCLOPramide (REGLAN) 10 MG tablet Take 10 mg by mouth 3 (three) times daily as needed for nausea or vomiting., Until Discontinued, Historical Med    piroxicam (FELDENE) 20 MG capsule Take 20 mg by mouth daily., Until Discontinued, Historical Med    traMADol (ULTRAM) 50 MG tablet Take 1 tablet (50 mg total) by mouth every 12 (twelve) hours as needed for moderate pain., Starting 12/01/2014, Until Discontinued, Print    potassium chloride 20 MEQ TBCR Take 10 mEq by mouth daily., Starting 05/03/2014, Until Discontinued, Print    promethazine (PHENERGAN) 25 MG suppository Place 1 suppository (25 mg total) rectally every 6 (six) hours as needed for nausea, vomiting or refractory nausea / vomiting., Starting 05/03/2014, Until Discontinued, Print      STOP taking these medications     ondansetron (ZOFRAN) 4 MG tablet      prochlorperazine (COMPAZINE) 10 MG tablet        Allergies  Allergen Reactions  . Morphine And Related Nausea Only  .  Scopolamine Rash   Follow-up Information    Follow up with [facility] In 2 weeks.   Contact information:   [place] [phone]       The results of significant diagnostics from this hospitalization (including imaging, microbiology, ancillary and laboratory) are listed below for reference.    Significant Diagnostic Studies: Ct Angio Head W/cm &/or Wo Cm  03/21/2015   CLINICAL DATA:  Altered mental status. Woke up from nap CT at chronic. Code stroke.  EXAM: CT ANGIOGRAPHY HEAD  TECHNIQUE: Multidetector CT imaging of the head was performed using the standard protocol during bolus administration of intravenous contrast. Multiplanar CT image reconstructions and MIPs were obtained to evaluate the vascular anatomy.  CONTRAST:  97m OMNIPAQUE IOHEXOL 350 MG/ML SOLN  COMPARISON:  CT head without contrast from the same day. MRI brain 02/25/2015.  FINDINGS: CT  HEAD  Brain: Postsurgical changes of the right cerebellum are again noted. No acute cortical infarct, hemorrhage, or mass lesion is present. There is no pathologic enhancement. The basal ganglia are intact. Insular cortex is intact.  Calvarium and skull base: Right occipital craniotomy is noted. The calvarium is otherwise intact.  Paranasal sinuses: Mild mucosal thickening is present in the anterior ethmoid air cells bilaterally and a left agger nasi cell  Orbits: Within normal limits  CTA HEAD  Anterior circulation: Minimal atherosclerotic calcifications are present within the cavernous internal carotid arteries. There are no significant stenoses. The ICA termini are normal bilaterally.  Anterior communicating artery is patent. The MCA bifurcations are intact. ACA and MCA branch vessels are unremarkable.  Posterior circulation: The right vertebral artery is slightly dominant to the left. PICA origins are visualized and normal. The vertebrobasilar junction is within normal limits. The basilar artery is unremarkable. The right posterior cerebral artery originates from the basilar tip. The left posterior cerebral artery is of fetal type. The PCA branch vessels are within normal limits.  Venous sinuses: The dural sinuses are patent. The right transverse sinus is dominant. The straight sinus is patent. The deep cerebral veins are patent.  Anatomic variants: Fetal type left posterior cerebral artery  IMPRESSION: 1. No acute abnormality. 2. Normal CTA circle of Willis without evidence for significant proximal stenosis, aneurysm, or branch vessel occlusion. 3. Postoperative changes of the right cerebellum. These results were called by telephone at the time of interpretation on 03/21/2015 at 3:39pm to Dr. PJim Like, who verbally acknowledged these results.   Electronically Signed   By: CSan MorelleM.D.   On: 03/21/2015 16:03   Ct Head Wo Contrast  03/21/2015   CLINICAL DATA:  Altered mental status.  Code  stroke.  EXAM: CT HEAD WITHOUT CONTRAST  TECHNIQUE: Contiguous axial images were obtained from the base of the skull through the vertex without intravenous contrast.  COMPARISON:  MRI 02/25/2015.  FINDINGS: RIGHT suboccipital craniectomy with encephalomalacia in the RIGHT cerebellar hemisphere.  No mass lesion, mass effect, midline shift, hydrocephalus, hemorrhage. No territorial ischemia or acute infarction. Small areas of thickening are present within the posterior maxillary sinuses which may represent fluid levels which can be seen in acute sinusitis. This could also represent ordinary mucosal thickening which is partially visible. Opacification of the frontal sinuses and partial opacification of the ethmoid air cells.  IMPRESSION: 1. No acute intracranial abnormality. 2. RIGHT suboccipital craniectomy with encephalomalacia from previous RIGHT cerebellar hemisphere resection. 3. Critical Value/emergent results were called by telephone at the time of interpretation on  03/21/2015 at 2:53 pm to Dr. Jerene Canny , who verbally acknowledged these results.   Electronically Signed   By: Dereck Ligas M.D.   On: 03/21/2015 14:54   Mr Jeri Cos QI Contrast  03/21/2015   CLINICAL DATA:  Initial evaluation for acute altered mental status, confusion. History of metastatic melanoma. Most recent brain MRIs have been negative for metastatic disease.  EXAM: MRI HEAD WITHOUT AND WITH CONTRAST  TECHNIQUE: Multiplanar, multiecho pulse sequences of the brain and surrounding structures were obtained without and with intravenous contrast.  CONTRAST:  100m MULTIHANCE GADOBENATE DIMEGLUMINE 529 MG/ML IV SOLN  COMPARISON:  Prior CT from earlier the same day as well as previous brain MRI from 02/25/2015.  FINDINGS: Postoperative changes from prior right suboccipital craniotomy again seen. There is encephalomalacia within the right cerebellar hemisphere, similar to previous. No mass lesion or abnormal enhancement identified  to suggest intracranial metastasis.  There is abnormal T2/FLAIR signal intensity with edema involving predominantly the cortical gray matter of the bilateral temporal occipital regions, right greater than left. There is involvement of the mesial right temporal lobe, although there is suggestion of probable increased signal intensity within the mesial left temporal lobe as well. The right hippocampus appears swollen with increased T2/FLAIR signal intensity. Right fornix is involved. No associated abnormal enhancement on post-contrast sequences.  No mass lesion or midline shift. No hydrocephalus. No extra-axial fluid collection.  Craniocervical junction within normal limits. Pituitary gland normal. No acute abnormality about the orbits.  Small amount layering opacity present within the left maxillary sinus. There is scattered mucosal thickening within the ethmoidal air cells. Frontal sinus partially opacified. Scattered fluid signal intensity present within the mastoid air cells bilaterally. Inner ear structures not well evaluated on this exam.  No acute large vessel territory infarct. No intracranial hemorrhage. Gray-white matter differentiation maintained. Normal intravascular flow voids preserved.  Mild degenerative changes noted within the upper cervical spine. Bone marrow signal intensity normal. Scalp soft tissues within normal limits.  IMPRESSION: 1. Abnormal T2/FLAIR signal intensity with edema involving predominantly the cortical gray matter of the bilateral temporal occipital regions, right greater than left, with prominent involvement of the mesial right temporal lobe and hippocampus, although there is more mild abnormal signal intensity within the mesial left temporal lobe as well. No associated enhancement. Findings are of uncertain etiology. Differential considerations include possible postictal changes, PRES, or possibly limbic encephalitis. Possible infectious etiology such as herpes encephalitis could  also be considered. Correlation with CSF may be helpful for further evaluation. 2. Postoperative changes from prior right suboccipital craniotomy with right cerebellar encephalomalacia. No intracranial mass lesion or abnormal enhancement to suggest metastatic disease identified. 3. No other acute intracranial process identified.   Electronically Signed   By: BJeannine BogaM.D.   On: 03/21/2015 22:24   Mr Outside Films Head/face  03/16/2015   CLINICAL DATA:    This exam is stored here for comparison purposes only and was performed at  an outside facility.   Please contact the originating institution for any  associated interpretation or report.     Microbiology: Recent Results (from the past 240 hour(s))  CSF culture     Status: None   Collection Time: 03/21/15  4:33 PM  Result Value Ref Range Status   Specimen Description CSF  Final   Special Requests NONE  Final   Gram Stain   Final    CYTOSPIN WBC PRESENT, PREDOMINANTLY MONONUCLEAR NO ORGANISMS SEEN [facility]  Culture   Final    NO GROWTH 3 DAYS Performed at [facility]    Report Status 03/25/2015 FINAL  Final  Gram stain     Status: None   Collection Time: 03/21/15  4:33 PM  Result Value Ref Range Status   Specimen Description CSF  Final   Special Requests TUBE 2  Final   Gram Stain NO WBC SEEN NO ORGANISMS SEEN   Final   Report Status 03/21/2015 FINAL  Final  Culture, blood (routine x 2)     Status: None (Preliminary result)   Collection Time: 03/21/15 10:31 PM  Result Value Ref Range Status   Specimen Description BLOOD LEFT ARM  Final   Special Requests BOTTLES DRAWN AEROBIC AND ANAEROBIC 10 CC EACH  Final   Culture   Final           BLOOD CULTURE RECEIVED NO GROWTH TO DATE CULTURE WILL BE HELD FOR 5 DAYS BEFORE ISSUING A FINAL NEGATIVE REPORT Performed at [facility]    Report Status PENDING  Incomplete  Culture, blood (routine x 2)     Status: None (Preliminary  result)   Collection Time: 03/21/15 10:40 PM  Result Value Ref Range Status   Specimen Description BLOOD RIGHT ARM  Final   Special Requests BOTTLES DRAWN AEROBIC AND ANAEROBIC 10CC EACH  Final   Culture   Final           BLOOD CULTURE RECEIVED NO GROWTH TO DATE CULTURE WILL BE HELD FOR 5 DAYS BEFORE ISSUING A FINAL NEGATIVE REPORT Performed at [facility]    Report Status PENDING  Incomplete     Labs: Basic Metabolic Panel:  Recent Labs Lab 03/21/15 1456 03/21/15 1512 03/22/15 0815 03/23/15 0634 03/24/15 0755  NA 135 136 134* 132* 140  K 3.5 3.6 3.8 3.3* 4.0  CL 98 98 101 101 108  CO2 25  --  '22 23 22  '$ GLUCOSE 204* 204* 131* 68* 63*  BUN '13 15 11 7 '$ <5*  CREATININE 1.18* 1.10 0.93 0.91 0.87  CALCIUM 10.9*  --  9.6 8.9 9.7   Liver Function Tests:  Recent Labs Lab 03/21/15 1456 03/21/15 1633 03/22/15 1543  AST 31  --  20  ALT 19  --  12  ALKPHOS 75  --  56  BILITOT 1.0  --  1.3*  PROT 7.1  --  5.5*  ALBUMIN 4.2 TNP 3.2*   No results for input(s): LIPASE, AMYLASE in the last 168 hours.  Recent Labs Lab 03/21/15 1706 03/22/15 1543  AMMONIA 77* 32   CBC:  Recent Labs Lab 03/21/15 1456 03/21/15 1512 03/22/15 0815 03/23/15 0634 03/24/15 0755  WBC 12.6*  --  10.0 6.0 4.8  NEUTROABS 9.7*  --   --   --   --   HGB 13.8 15.6* 11.3* 8.9* 9.4*  HCT 41.1 46.0 33.8* 26.8* 28.1*  MCV 90.1  --  89.4 90.5 91.2  PLT 280  --  258 202 210   Cardiac Enzymes: No results for input(s): CKTOTAL, CKMB, CKMBINDEX, TROPONINI in the last 168 hours. BNP: BNP (last 3 results) No results for input(s): BNP in the last 8760 hours.  ProBNP (last 3 results) No results for input(s): PROBNP in the last 8760 hours.  CBG: No results for input(s): GLUCAP in the last 168 hours.     SignedKelvin Cellar  [facility] 03/25/2015, 4:12 PM

## 2015-03-26 LAB — ENTEROVIRUS PCR: Enterovirus PCR: NEGATIVE

## 2015-03-26 LAB — MISCELLANEOUS TEST

## 2015-03-28 LAB — CULTURE, BLOOD (ROUTINE X 2)
CULTURE: NO GROWTH
Culture: NO GROWTH

## 2015-03-30 LAB — WEST NILE AB, IGG AND IGM, CSF: West Nile Ab, IgG, CSF: <1.3 index (ref <1.30)

## 2015-03-31 DIAGNOSIS — R55 Syncope and collapse: Secondary | ICD-10-CM

## 2015-03-31 HISTORY — DX: Syncope and collapse: R55

## 2015-04-02 ENCOUNTER — Encounter (HOSPITAL_BASED_OUTPATIENT_CLINIC_OR_DEPARTMENT_OTHER): Payer: Self-pay | Admitting: *Deleted

## 2015-04-02 ENCOUNTER — Emergency Department (HOSPITAL_BASED_OUTPATIENT_CLINIC_OR_DEPARTMENT_OTHER)
Admission: EM | Admit: 2015-04-02 | Discharge: 2015-04-02 | Disposition: A | Discharge status: Home / Self Care | Payer: 59 | Attending: Emergency Medicine | Admitting: Emergency Medicine

## 2015-04-02 DIAGNOSIS — K219 Gastro-esophageal reflux disease without esophagitis: Secondary | ICD-10-CM | POA: Diagnosis not present

## 2015-04-02 DIAGNOSIS — Z79899 Other long term (current) drug therapy: Secondary | ICD-10-CM | POA: Diagnosis not present

## 2015-04-02 DIAGNOSIS — Z87891 Personal history of nicotine dependence: Secondary | ICD-10-CM | POA: Diagnosis not present

## 2015-04-02 DIAGNOSIS — F0390 Unspecified dementia without behavioral disturbance: Secondary | ICD-10-CM | POA: Insufficient documentation

## 2015-04-02 DIAGNOSIS — Z85841 Personal history of malignant neoplasm of brain: Secondary | ICD-10-CM | POA: Insufficient documentation

## 2015-04-02 DIAGNOSIS — F419 Anxiety disorder, unspecified: Secondary | ICD-10-CM | POA: Diagnosis not present

## 2015-04-02 DIAGNOSIS — K5900 Constipation, unspecified: Secondary | ICD-10-CM | POA: Diagnosis present

## 2015-04-02 DIAGNOSIS — Z85118 Personal history of other malignant neoplasm of bronchus and lung: Secondary | ICD-10-CM | POA: Diagnosis not present

## 2015-04-02 DIAGNOSIS — E039 Hypothyroidism, unspecified: Secondary | ICD-10-CM | POA: Insufficient documentation

## 2015-04-02 DIAGNOSIS — Z87828 Personal history of other (healed) physical injury and trauma: Secondary | ICD-10-CM | POA: Diagnosis not present

## 2015-04-02 DIAGNOSIS — Z9049 Acquired absence of other specified parts of digestive tract: Secondary | ICD-10-CM | POA: Insufficient documentation

## 2015-04-02 MED ORDER — DOCUSATE SODIUM 100 MG PO CAPS: 100.0000 mg | ORAL_CAPSULE | Freq: Two times a day (BID) | ORAL | Status: DC

## 2015-04-02 MED ORDER — MAGNESIUM CITRATE PO SOLN: 1.0000 | Freq: Once | ORAL | Status: DC

## 2015-04-02 NOTE — ED Provider Notes (Signed)
CSN: [id number]     Arrival date & time 04/02/15  2006 History  This chart was scribed for Fredia Sorrow, MD by Rayfield Citizen, ED Scribe. This patient was seen in room MH10/MH10 and the patient's care was started at 9:57 PM.   Chief Complaint  Patient presents with  . Constipation   Patient is a 54 y.o. female presenting with constipation. The history is provided by the patient. No language interpreter was used.  Constipation Severity:  Mild Time since last bowel movement:  3 days Timing:  Constant Progression:  Worsening Chronicity:  New Context: medication   Stool description:  None produced Relieved by:  Nothing Worsened by:  Prescription drugs Ineffective treatments:  Enemas, Miralax, laxatives and stool softeners Associated symptoms: vomiting   Associated symptoms: no abdominal pain, no back pain, no diarrhea, no dysuria, no fever and no nausea      HPI Comments: [name] [name] is a 54 y.o. female who presents to the Emergency Department complaining of 3 days of constipation and vomiting. She reports lightheadedness when straining to have a bowel movement. She has taken stool softeners, laxatives, and utilized an enema at home without relief. Patient regularly takes Tramadol and Reglan (for headaches) and other pain medications.   Past Medical History  Diagnosis Date  . TMJ (dislocation of temporomandibular joint)   . Thyroid disease   . Lung cancer     awaiting pathology  . Brain cancer     awaiting pathology; assuming this is a lung primary with brain mets  . Dementia   . Hypothyroidism   . GERD (gastroesophageal reflux disease)   . Anxiety    Past Surgical History  Procedure Laterality Date  . Tonsillectomy    . Appendectomy    . Suboccipital craniectomy cervical laminectomy N/A 11/19/2013    Procedure: SUBOCCIPITAL CRANIECTOMY for tumor;  Surgeon: Eustace Moore, MD;  Location: [facility];  Service: Neurosurgery;  Laterality: N/A;  . Lumbar wound debridement  N/A 12/05/2013    Procedure: irrigation and debridement posterior cervical wound;  Surgeon: Eustace Moore, MD;  Location: [facility];  Service: Neurosurgery;  Laterality: N/A;  . Video assisted thoracoscopy (vats)/wedge resection Left 03/05/2014    Procedure: VIDEO ASSISTED THORACOSCOPY (VATS)/WEDGE RESECTION;  Surgeon: Melrose Nakayama, MD;  Location: [facility];  Service: Thoracic;  Laterality: Left;   Family History  Problem Relation Age of Onset  . Dementia Mother   . Cancer - Other Other    History  Substance Use Topics  . Smoking status: Former Smoker    Quit date: 11/19/2013  . Smokeless tobacco: Never Used  . Alcohol Use: No   OB History    No data available     Review of Systems  Constitutional: Negative for fever and chills.  HENT: Negative for rhinorrhea and sore throat.   Eyes: Negative for visual disturbance.  Respiratory: Negative for cough and shortness of breath.   Cardiovascular: Negative for chest pain and leg swelling.  Gastrointestinal: Positive for vomiting and constipation. Negative for nausea, abdominal pain and diarrhea.  Genitourinary: Negative for dysuria, frequency and hematuria.  Musculoskeletal: Negative for back pain.  Skin: Negative for rash.  Neurological: Negative for headaches.  Hematological: Does not bruise/bleed easily.  Psychiatric/Behavioral: Negative for confusion.   Allergies  Morphine and related and Scopolamine  Home Medications   Prior to Admission medications   Medication Sig Start Date End Date Taking? Authorizing Provider  citalopram (CELEXA) 20 MG tablet Take 20  mg by mouth every morning.  07/14/14 07/14/15  Historical Provider, MD  docusate sodium (COLACE) 100 MG capsule Take 1 capsule (100 mg total) by mouth every 12 (twelve) hours. 04/02/15   Fredia Sorrow, MD  esomeprazole (NEXIUM) 20 MG capsule Take 20 mg by mouth daily at 12 noon.    Historical Provider, MD  gabapentin (NEURONTIN) 300 MG capsule Take 300 mg by mouth at  bedtime.    Historical Provider, MD  hydrocortisone 2.5 % cream Apply 1 application topically as needed (for rash., itching).    Historical Provider, MD  levothyroxine (SYNTHROID, LEVOTHROID) 100 MCG tablet Take 100 mcg by mouth daily before breakfast.    Historical Provider, MD  LORazepam (ATIVAN) 1 MG tablet Take 1 tablet (1 mg total) by mouth every 12 (twelve) hours as needed for anxiety or sleep. Patient taking differently: Take 1 mg by mouth at bedtime.  05/03/14   Robbie Lis, MD  magnesium citrate SOLN Take 296 mLs (1 Bottle total) by mouth once. 04/02/15   Fredia Sorrow, MD  metoCLOPramide (REGLAN) 10 MG tablet Take 10 mg by mouth 3 (three) times daily as needed for nausea or vomiting.    Historical Provider, MD  piroxicam (FELDENE) 20 MG capsule Take 20 mg by mouth daily.    Historical Provider, MD  potassium chloride 20 MEQ TBCR Take 10 mEq by mouth daily. Patient not taking: Reported on 12/01/2014 05/03/14   Robbie Lis, MD  promethazine (PHENERGAN) 25 MG suppository Place 1 suppository (25 mg total) rectally every 6 (six) hours as needed for nausea, vomiting or refractory nausea / vomiting. Patient not taking: Reported on 12/01/2014 05/03/14   Robbie Lis, MD  traMADol (ULTRAM) 50 MG tablet Take 1 tablet (50 mg total) by mouth every 12 (twelve) hours as needed for moderate pain. 12/01/14   Tyler Pita, MD   BP 107/56 mmHg  Pulse 99  Temp(Src) 97.8 F (36.6 C) (Oral)  Resp 20  Ht '5\' 7"'$  (1.702 m)  Wt 92 lb (41.731 kg)  BMI 14.41 kg/m2  SpO2 97% Physical Exam  Constitutional: She is oriented to person, place, and time. She appears well-developed and well-nourished.  HENT:  Head: Normocephalic and atraumatic.  Eyes: EOM are normal. Pupils are equal, round, and reactive to light. No scleral icterus.  Neck: No tracheal deviation present.  Cardiovascular: Normal rate, regular rhythm and normal heart sounds.  Exam reveals no gallop and no friction rub.   No murmur  heard. Pulmonary/Chest: Effort normal and breath sounds normal. No respiratory distress. She has no wheezes. She has no rales.  Abdominal: Soft. Bowel sounds are normal. She exhibits no distension. There is no tenderness. There is no rebound and no guarding.  Genitourinary:  Small skin tag; no external hemorrhoids, no fissures. Small amount of firm stool in the rectal vault but not impacted.   Musculoskeletal: She exhibits no edema.  Neurological: She is alert and oriented to person, place, and time. No cranial nerve deficit.  Skin: Skin is warm and dry.  Psychiatric: She has a normal mood and affect. Her behavior is normal.  Nursing note and vitals reviewed.   ED Course  Procedures   DIAGNOSTIC STUDIES: Oxygen Saturation is 97% on RA, adequate by my interpretation.    COORDINATION OF CARE: 10:21 PM Discussed treatment plan with pt at bedside and pt agreed to plan.   Labs Review Labs Reviewed - No data to display  Imaging Review No results found.   EKG Interpretation  None      MDM   Final diagnoses:  Constipation, unspecified constipation type   Initial confusion on reading patient's discharge summary from her admission the end of April there was concern that she still had metastatic melanoma. CT of head and CT angios showed no evidence of any brain problems. Patient states that she has been cancer free. Followed by [facility]. Patient here tonight with concerns for not having a bowel movement for 3 days. On rectal exam showed little bit of increased stool in the rectal vault that was little hard but certainly no impaction. No masses. No prolapsed internal hemorrhoids no external hemorrhoids. Recommended patient just use Colace twice a day to help gently have a bowel movement. Also given a prescription for Maxitrol if the Colace does not help over the next few days. Patient's abdomen is soft and flat. There is no clinical concerns for bowel obstruction. No clinical concerns for an  acute abdominal process.   I personally performed the services described in this documentation, which was scribed in my presence. The recorded information has been reviewed and is accurate.       Fredia Sorrow, MD 04/02/15 [phone]

## 2015-04-02 NOTE — ED Notes (Signed)
Sleepy, lethargic, arousable to voice, interactive,  Calm, NAD, c/o abd rectal pain and constipation r/t pain meds. "Has taken miralax and dulcolax, but do not have time to wait for it to work", h/o same, last BM 3d ago. Last ate today, but very little, no appetite (usual for pt).

## 2015-04-02 NOTE — ED Notes (Signed)
States she is constipated and needs an enema.

## 2015-04-02 NOTE — ED Notes (Signed)
EDP at [facility] for team d/c, husband present at [facility].

## 2015-04-02 NOTE — Discharge Instructions (Signed)
Constipation Constipation is when a person:  Poops (has a bowel movement) less than 3 times a week.  Has a hard time pooping.  Has poop that is dry, hard, or bigger than normal. HOME CARE   Eat foods with a lot of fiber in them. This includes fruits, vegetables, beans, and whole grains such as brown rice.  Avoid fatty foods and foods with a lot of sugar. This includes french fries, hamburgers, cookies, candy, and soda.  If you are not getting enough fiber from food, take products with added fiber in them (supplements).  Drink enough fluid to keep your pee (urine) clear or pale yellow.  Exercise on a regular basis, or as told by your doctor.  Go to the restroom when you feel like you need to poop. Do not hold it.  Only take medicine as told by your doctor. Do not take medicines that help you poop (laxatives) without talking to your doctor first. GET HELP RIGHT AWAY IF:   You have bright red blood in your poop (stool).  Your constipation lasts more than 4 days or gets worse.  You have belly (abdominal) or butt (rectal) pain.  You have thin poop (as thin as a pencil).  You lose weight, and it cannot be explained. MAKE SURE YOU:   Understand these instructions.  Will watch your condition.  Will get help right away if you are not doing well or get worse. Document Released: 05/02/2008 Document Revised: 11/19/2013 Document Reviewed: 08/26/2013 [employer] Patient Information 2015 [place], [place]. This information is not intended to replace advice given to you by your health care provider. Make sure you discuss any questions you have with your health care provider.  Would recommend taking the Colace twice a day for the next 2-3 days. If still having trouble with having a bowel movement and then you can take the mag citrate. Return for any new or worse symptoms. Make an appointment to follow-up with your regular doctor.

## 2015-04-02 NOTE — ED Notes (Signed)
Out in w/c

## 2015-04-06 ENCOUNTER — Encounter: Payer: Self-pay | Admitting: Radiation Oncology

## 2015-04-06 ENCOUNTER — Encounter: Payer: Self-pay | Admitting: Neurology

## 2015-04-06 ENCOUNTER — Ambulatory Visit (INDEPENDENT_AMBULATORY_CARE_PROVIDER_SITE_OTHER): Payer: 59 | Admitting: Neurology

## 2015-04-06 ENCOUNTER — Ambulatory Visit
Admission: RE | Admit: 2015-04-06 | Discharge: 2015-04-06 | Disposition: A | Discharge status: Home / Self Care | Payer: 59 | Source: Ambulatory Visit | Attending: Radiation Oncology | Admitting: Radiation Oncology

## 2015-04-06 VITALS — BP 84/43 | HR 106 | Temp 97.7°F | Resp 16 | Wt 91.7 lb

## 2015-04-06 VITALS — HR 106 | Resp 16 | Wt 91.0 lb

## 2015-04-06 DIAGNOSIS — R9089 Other abnormal findings on diagnostic imaging of central nervous system: Secondary | ICD-10-CM | POA: Insufficient documentation

## 2015-04-06 DIAGNOSIS — C7931 Secondary malignant neoplasm of brain: Secondary | ICD-10-CM

## 2015-04-06 DIAGNOSIS — R404 Transient alteration of awareness: Secondary | ICD-10-CM | POA: Diagnosis not present

## 2015-04-06 DIAGNOSIS — C7800 Secondary malignant neoplasm of unspecified lung: Secondary | ICD-10-CM

## 2015-04-06 DIAGNOSIS — R930 Abnormal findings on diagnostic imaging of skull and head, not elsewhere classified: Secondary | ICD-10-CM | POA: Diagnosis not present

## 2015-04-06 DIAGNOSIS — C7949 Secondary malignant neoplasm of other parts of nervous system: Secondary | ICD-10-CM

## 2015-04-06 NOTE — Progress Notes (Signed)
Scheduled to follow up with oncologist at [facility] later today. Patient tearful today. Reports a daily headache 10 on a scale of 0-10. Shuffling gait with the aid of cane noted. Reports nausea and vomiting. Patient reports a syncopal episode on 4/15. Patient reports she is fearful this will happen again. 11 lb weight loss noted since January. Denies ringing in her ears. Denies diplopia. Reports diminished left peripheral vision. Patient unable to focus and rambling inappropriately.

## 2015-04-06 NOTE — Progress Notes (Signed)
NEUROLOGY CONSULTATION NOTE  [name] [name] MRN: [id number] DOB: July 14, 1961  Referring provider: Dr. Orpah Melter Primary care provider: Dr. Orpah Melter  Reason for consult:  Headaches, recent hospital visit for acute encephalopathy  Dear Dr Olen Pel:  Thank you for your kind referral of [name] [name] for consultation of the above symptoms. Although her history is well known to you, please allow me to reiterate it for the purpose of our medical record. The patient was accompanied to the clinic by her husband who also provides collateral information. Records and images were personally reviewed where available.  HISTORY OF PRESENT ILLNESS: This is a 54 year old right-handed woman with a history of metastatic melanoma with brain metastasis s/p craniotomy, with negative MRI brain scans at [place] (most recently 02/25/15), in her usual state of health until 03/21/15 when she presented to [facility] ER with altered mental status. She and her husband went camping from 4/13 to 4/17. She recalls having a bad headache and was up all night. She did not recall much of the drive back, but apparently her husband had difficulty arousing her, then she was just looking at him, babbling, diaphoretic. She was brought to [facility] ER where she was evaluated by Neurology, per notes she would have intermittent periods where she would become lucid, then become non-verbal with eyes staring off to either side. There is note that she had diarrhea and nausea the day prior. On exam, she was oriented x 1, intermittently able to provide a history then becomes less responsive, intermittently following commands. She had left-sided neglect. She had an MRI brain with and without contrast which I personally reviewed, there was prior right suboccipital craniotomy with encephalomalacia in the right cerebellar hemisphere. There was abnormal T2/FLAIR signal with edema involving predominantly the cortical gray matter of the bilateral temporal  occipital regions, right greater than left, with involvement of the mesial right temporal lobe, as well as probable increased signal in the left mesial temporal lobe. Considerations included post-ictal changes, PRES, limbic encephalitis. Her routine wake and sleep EEG was abnormal due to mild diffuse slowing. She had a lumbar puncture with CSF WBC 0, RBC 2, protein 68, glucose 98. HSV, Lyme, West Nile, Enterovirus, gram stain and culture were negative. Serum Lyme and RMSF were negative. Her B12, Hepatitis panel, HIV Ab were negative. TSH elevated at 7.001. Ammonia level initially 77, down to 32 the next day. TPO Ab, NMDA Ab negative.   She reports waking up in the hospital and still feeling confused. Her husband notes her speech is much better, but she continues to complain of weakness in her arms, legs, and head. She is very verbose in the office, her husband states she does not babble anymore but still says things that don't make sense. No further staring spells. She still feels she cannot ambulate well, and cannot see out of her left side. Her other main concern are her headaches. She had been seeing headache specialist Dr. Domingo Cocking since her craniotomy in 10/2013 for pain behind the right eye, temple region, and occipital region ("there is fire back there"). Pain has been constant the past month. She was started on gabapentin, with dose increased to '600mg'$  qhs by Dr. Domingo Cocking after her recent hospital stay, with additional '100mg'$  prn for breakthrough pain. She ambulates with a cane, denies any falls. She states she is very dependent on her husband and "all I want it the gabapentin." If she sleeps, headaches improve, however she can sleep all day. She  has occasional tingling in the 2nd and 3rd digits of both hands. 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 She denies any olfactory/gustatory hallucinations, deja vu, rising epigastric sensation, myoclonic  jerks. She had a normal birth and early development.  There is no history of febrile convulsions, significant traumatic brain injury, or family history of seizures. She is concerned about poor weight gain and poor appetite.  PAST MEDICAL HISTORY: Past Medical History  Diagnosis Date  . TMJ (dislocation of temporomandibular joint)   . Thyroid disease   . Lung cancer     awaiting pathology  . Brain cancer     awaiting pathology; assuming this is a lung primary with brain mets  . Dementia   . Hypothyroidism   . GERD (gastroesophageal reflux disease)   . Anxiety     PAST SURGICAL HISTORY: Past Surgical History  Procedure Laterality Date  . Tonsillectomy    . Appendectomy    . Suboccipital craniectomy cervical laminectomy N/A 11/19/2013    Procedure: SUBOCCIPITAL CRANIECTOMY for tumor;  Surgeon: Eustace Moore, MD;  Location: [place];  Service: Neurosurgery;  Laterality: N/A;  . Lumbar wound debridement N/A 12/05/2013    Procedure: irrigation and debridement posterior cervical wound;  Surgeon: Eustace Moore, MD;  Location: [facility];  Service: Neurosurgery;  Laterality: N/A;  . Video assisted thoracoscopy (vats)/wedge resection Left 03/05/2014    Procedure: VIDEO ASSISTED THORACOSCOPY (VATS)/WEDGE RESECTION;  Surgeon: Melrose Nakayama, MD;  Location: [facility];  Service: Thoracic;  Laterality: Left;    MEDICATIONS: Current Outpatient Prescriptions on File Prior to Visit  Medication Sig Dispense Refill  . citalopram (CELEXA) 20 MG tablet Take 20 mg by mouth every morning.     . docusate sodium (COLACE) 100 MG capsule Take 1 capsule (100 mg total) by mouth every 12 (twelve) hours. 14 capsule 1  . esomeprazole (NEXIUM) 20 MG capsule Take 20 mg by mouth daily at 12 noon.    . gabapentin (NEURONTIN) 300 MG capsule Take 300 mg by mouth at bedtime.    . hydrocortisone 2.5 % cream Apply 1 application topically as needed (for rash., itching).    Marland Kitchen levothyroxine (SYNTHROID, LEVOTHROID) 100  MCG tablet Take 100 mcg by mouth daily before breakfast.    . LORazepam (ATIVAN) 1 MG tablet Take 1 tablet (1 mg total) by mouth every 12 (twelve) hours as needed for anxiety or sleep. 60 tablet 0  . metoCLOPramide (REGLAN) 10 MG tablet Take 10 mg by mouth 3 (three) times daily as needed for nausea or vomiting.    . traMADol (ULTRAM) 50 MG tablet Take 1 tablet (50 mg total) by mouth every 12 (twelve) hours as needed for moderate pain. 50 tablet 0   No current facility-administered medications on file prior to visit.    ALLERGIES: Allergies  Allergen Reactions  . Morphine And Related Nausea Only  . Scopolamine Rash    FAMILY HISTORY: Family History  Problem Relation Age of Onset  . Dementia Mother   . Cancer - Other Other     SOCIAL HISTORY: History   Social History  . Marital Status: Married    Spouse Name: N/A  . Number of Children: N/A  . Years of Education: N/A   Occupational History  . Not on file.   Social History Main Topics  . Smoking status: Former Smoker    Quit date: 11/19/2013  . Smokeless tobacco: Never Used  .  Alcohol Use: No  . Drug Use: No  . Sexual Activity: Not on file   Other Topics Concern  . Not on file   Social History Narrative    REVIEW OF SYSTEMS: Constitutional: No fevers, chills, or sweats, +generalized fatigue, change in appetite Eyes: No visual changes, double vision, eye pain Ear, nose and throat: No hearing loss, ear pain, nasal congestion, sore throat Cardiovascular: No chest pain, palpitations Respiratory:  No shortness of breath at rest or with exertion, wheezes GastrointestinaI: No nausea, vomiting, diarrhea, abdominal pain, fecal incontinence Genitourinary:  No dysuria, urinary retention or frequency Musculoskeletal:  No neck pain, back pain Integumentary: No rash, pruritus, skin lesions Neurological: as above Psychiatric: No depression, insomnia, anxiety Endocrine: No palpitations, fatigue, diaphoresis, mood swings,  +change in appetite, change in weight, no increased thirst Hematologic/Lymphatic:  No anemia, purpura, petechiae. Allergic/Immunologic: no itchy/runny eyes, nasal congestion, recent allergic reactions, rashes  PHYSICAL EXAM: Filed Vitals:   04/06/15 1152  Pulse: 106  Resp: 16   General: No acute distress, very verbose, perseverates on her weight and headaches Head:  Normocephalic/atraumatic Eyes: Fundoscopic exam shows bilateral sharp discs, no vessel changes, exudates, or hemorrhages Neck: supple, no paraspinal tenderness, full range of motion Back: No paraspinal tenderness Heart: regular rate and rhythm Lungs: Clear to auscultation bilaterally. Vascular: No carotid bruits. Skin/Extremities: No rash, no edema Neurological Exam: Mental status: alert and oriented to person, place, and time, no dysarthria or aphasia, Fund of knowledge is appropriate.  Recent and remote memory are intact.  Attention and concentration are normal.    Able to name objects and repeat phrases.  MMSE - Mini Mental State Exam 04/06/2015  Orientation to time 5  Orientation to Place 5  Registration 3  Attention/ Calculation 5  Recall 3  Language- name 2 objects 2  Language- repeat 1  Language- follow 3 step command 3  Language- read & follow direction 1  Write a sentence 1  Copy design 1  Total score 30   Cranial nerves: CN I: not tested CN II: pupils equal, round and reactive to light, left homonymous hemianopia, fundi unremarkable. CN III, IV, VI:  full range of motion, no nystagmus, no ptosis CN V: facial sensation intact CN VII: upper and lower face symmetric CN VIII: hearing intact to finger rub CN IX, X: gag intact, uvula midline CN XI: sternocleidomastoid and trapezius muscles intact CN XII: tongue midline Bulk & Tone: normal, no fasciculations. Motor: 5/5 throughout with no pronator drift. Sensation: intact to light touch, cold, pin, vibration and joint position sense.  No extinction to  double simultaneous stimulation.  Romberg test negative Deep Tendon Reflexes: brisk +2 throughout, no ankle clonus Plantar responses: downgoing bilaterally Cerebellar: no incoordination on finger to nose, heel to shin. No dysdiadochokinesia Gait: slow and cautious, ambulates better with cane, no ataxia Tremor: none  IMPRESSION: This is a 54 year old right-handed woman with a history of metastatic melanoma to the brain s/p suboccipital craniotomy, presenting with an episode of altered mental status last 03/21/15. Her MRI brain at that time was abnormal with increased signal in the bilateral temporal occipital regions, right greater than left, with involvement of the mesial temporal lobes. These findings can be seen with post-ictal changes, PRES, limbic encephalitis. CSF studies normal, cytology not done. The etiology of her symptoms is unclear, concern is for a neoplastic/paraneoplastic process in the setting of history of melanoma. So far NMDA receptor Ab is negative, there is another pending miscellaneous test that I  am unable to view. She has a follow-up with [facility] today as well. Her exam continues to show a left homonymous hemianopia. She is very verbose, her husband reports this is her baseline and that confusion has mostly resolved. A repeat MRI brain with and without contrast will be ordered for interval change, she may benefit from repeat LP versus brain biopsy if lesions persist. She follows with the [facility] [place] for chronic headaches and will call Dr. Domingo Cocking regarding gabapentin dose. [place] driving laws were discussed with the patient, and she knows to stop driving after an episode of loss of awareness, until 6 months event-free.   Thank you for allowing me to participate in the care of this patient. Please do not hesitate to call for any questions or concerns.   Ellouise Newer, M.D.  CC: Dr. Olen Pel, Charmaine Downs, NP

## 2015-04-06 NOTE — Progress Notes (Signed)
Radiation Oncology         [phone]    Name: [name] [name]   Date: 04/06/2015   MRN: [id number]  DOB: 05/24/1961    Multidisciplinary Brain and Spine Oncology Clinic Follow-Up Visit Note  CC: Orpah Melter, MD  Eustace Moore, MD    ICD-9-CM ICD-10-CM   1. Metastatic melanoma to lung, unspecified laterality 197.0 C78.00    172.9 C43.9     Diagnosis:   54 year old woman status post resection of a 17 mm right cerebellar BRAF positive brain tumor followed by post-op conformal irradiation to 40 Gy in 10 fractions through 01/16/14  Interval Since Last Radiation:  15  months   Narrative:  The patient returns today for routine follow-up. Scheduled to follow up with oncologist at [facility] later today. Patient tearful today. Reports a daily headache 10 on a scale of 0-10. Shuffling gait with the aid of cane noted. Reports nausea and vomiting. Patient reports a syncopal episode on 4/15. Patient reports she is fearful this will happen again. 11 lb weight loss noted since January. Denies ringing in her ears. Denies diplopia. Reports diminished left peripheral vision. Patient unable to focus and rambling inappropriately.  MRI showed no recurrence. Etiology remained unclear, some theories include postictal phenomenon versus PRES.                            ALLERGIES:  is allergic to morphine and related and scopolamine.  Meds: Current Outpatient Prescriptions  Medication Sig Dispense Refill  . citalopram (CELEXA) 20 MG tablet Take 20 mg by mouth every morning.     . gabapentin (NEURONTIN) 300 MG capsule Take 300 mg by mouth at bedtime.    Marland Kitchen levothyroxine (SYNTHROID, LEVOTHROID) 100 MCG tablet Take 100 mcg by mouth daily before breakfast.    . traMADol (ULTRAM) 50 MG tablet Take 1 tablet (50 mg total) by mouth every 12 (twelve) hours as needed for moderate pain. 50 tablet 0  . docusate sodium (COLACE) 100 MG capsule Take 1 capsule (100 mg total) by mouth every 12 (twelve) hours. (Patient not  taking: Reported on 04/06/2015) 14 capsule 1  . esomeprazole (NEXIUM) 20 MG capsule Take 20 mg by mouth daily at 12 noon.    . hydrocortisone 2.5 % cream Apply 1 application topically as needed (for rash., itching).    . LORazepam (ATIVAN) 1 MG tablet Take 1 tablet (1 mg total) by mouth every 12 (twelve) hours as needed for anxiety or sleep. (Patient not taking: Reported on 04/06/2015) 60 tablet 0  . magnesium citrate SOLN Take 296 mLs (1 Bottle total) by mouth once. (Patient not taking: Reported on 04/06/2015) 195 mL 0  . metoCLOPramide (REGLAN) 10 MG tablet Take 10 mg by mouth 3 (three) times daily as needed for nausea or vomiting.    . piroxicam (FELDENE) 20 MG capsule Take 20 mg by mouth daily.    . potassium chloride 20 MEQ TBCR Take 10 mEq by mouth daily. (Patient not taking: Reported on 12/01/2014) 5 tablet 0  . promethazine (PHENERGAN) 25 MG suppository Place 1 suppository (25 mg total) rectally every 6 (six) hours as needed for nausea, vomiting or refractory nausea / vomiting. (Patient not taking: Reported on 12/01/2014) 45 each 0   No current facility-administered medications for this encounter.    Physical Findings: The patient is in no acute distress. Patient is alert and oriented.  weight is 91 lb 11.2 oz (41.595 kg).  Her oral temperature is 97.7 F (36.5 C). Her blood pressure is 84/43 and her pulse is 106. Her respiration is 16 and oxygen saturation is 100%. .  No significant changes.  Lab Findings: Lab Results  Component Value Date   WBC 4.8 03/24/2015   HGB 9.4* 03/24/2015   HCT 28.1* 03/24/2015   MCV 91.2 03/24/2015   PLT 210 03/24/2015    @LASTCHEM @  Radiographic Findings: 03/21/15 MR Brain W Wo Contrast   Status: Final result       PACS Images     Show images for MR Brain W Wo Contrast     Study Result     CLINICAL DATA: Initial evaluation for acute altered mental status, confusion. History of metastatic melanoma. Most recent brain MRIs have been negative  for metastatic disease.  EXAM: MRI HEAD WITHOUT AND WITH CONTRAST  TECHNIQUE: Multiplanar, multiecho pulse sequences of the brain and surrounding structures were obtained without and with intravenous contrast.  CONTRAST: 45m MULTIHANCE GADOBENATE DIMEGLUMINE 529 MG/ML IV SOLN  COMPARISON: Prior CT from earlier the same day as well as previous brain MRI from 02/25/2015.  FINDINGS: Postoperative changes from prior right suboccipital craniotomy again seen. There is encephalomalacia within the right cerebellar hemisphere, similar to previous. No mass lesion or abnormal enhancement identified to suggest intracranial metastasis.  There is abnormal T2/FLAIR signal intensity with edema involving predominantly the cortical gray matter of the bilateral temporal occipital regions, right greater than left. There is involvement of the mesial right temporal lobe, although there is suggestion of probable increased signal intensity within the mesial left temporal lobe as well. The right hippocampus appears swollen with increased T2/FLAIR signal intensity. Right fornix is involved. No associated abnormal enhancement on post-contrast sequences.  No mass lesion or midline shift. No hydrocephalus. No extra-axial fluid collection.  Craniocervical junction within normal limits. Pituitary gland normal. No acute abnormality about the orbits.  Small amount layering opacity present within the left maxillary sinus. There is scattered mucosal thickening within the ethmoidal air cells. Frontal sinus partially opacified. Scattered fluid signal intensity present within the mastoid air cells bilaterally. Inner ear structures not well evaluated on this exam.  No acute large vessel territory infarct. No intracranial hemorrhage. Gray-white matter differentiation maintained. Normal intravascular flow voids preserved.  Mild degenerative changes noted within the upper cervical spine. Bone  marrow signal intensity normal. Scalp soft tissues within normal limits.  IMPRESSION: 1. Abnormal T2/FLAIR signal intensity with edema involving predominantly the cortical gray matter of the bilateral temporal occipital regions, right greater than left, with prominent involvement of the mesial right temporal lobe and hippocampus, although there is more mild abnormal signal intensity within the mesial left temporal lobe as well. No associated enhancement. Findings are of uncertain etiology. Differential considerations include possible postictal changes, PRES, or possibly limbic encephalitis. Possible infectious etiology such as herpes encephalitis could also be considered. Correlation with CSF may be helpful for further evaluation. 2. Postoperative changes from prior right suboccipital craniotomy with right cerebellar encephalomalacia. No intracranial mass lesion or abnormal enhancement to suggest metastatic disease identified. 3. No other acute intracranial process identified.   Electronically Signed  By: BJeannine BogaM.D.  On: 03/21/2015 22:24        Impression:  No evidence of recurrence at this time.   Plan:  Repeat MRI in 3 months and follow up. Continue to follow up with oncology AT [facility]   _____________________________________  MSheral Apley MTammi Klippel M.D.  This document serves as a record of services  personally performed by Tyler Pita, MD. It was created on his behalf by Derek Mound, a trained medical scribe. The creation of this record is based on the scribe's personal observations and the provider's statements to them. This document has been checked and approved by the attending provider.

## 2015-04-06 NOTE — Patient Instructions (Addendum)
1. Schedule MRI brain with and without contrast with thin cuts through the temporal lobes. We have scheduled you at [facility] for your MRI on 04/17/2015 at 2:00pm. Please arrive 15 minutes prior and go to 1st floor radiology. If you need to reschedule for any reason please call [phone]. 2. Continue headache care with Dr. Domingo Cocking at [facility] 3. Continue follow-up with [facility] 4. Follow-up in 2 months

## 2015-04-07 DIAGNOSIS — Z9289 Personal history of other medical treatment: Secondary | ICD-10-CM

## 2015-04-07 HISTORY — DX: Personal history of other medical treatment: Z92.89

## 2015-04-13 LAB — MISCELLANEOUS TEST

## 2015-04-16 ENCOUNTER — Telehealth: Payer: Self-pay | Admitting: Neurology

## 2015-04-16 NOTE — Telephone Encounter (Signed)
Per Barbie Haggis at GI, pt has MRI at [facility] tomorrow at 2pm. This has not been precerted. Please call insurance for precert and provide details to [facility]. / Sherri

## 2015-04-17 ENCOUNTER — Ambulatory Visit (HOSPITAL_COMMUNITY): Admission: RE | Admit: 2015-04-17 | Payer: 59 | Source: Ambulatory Visit

## 2015-04-17 NOTE — Telephone Encounter (Signed)
MRI has been [place]. Auth # [id number].

## 2015-04-21 ENCOUNTER — Telehealth: Payer: Self-pay | Admitting: Neurology

## 2015-04-21 NOTE — Telephone Encounter (Signed)
Please advise 

## 2015-04-21 NOTE — Telephone Encounter (Signed)
Pt called and stated that she needs a referral to see an neurophthalmologist, please call back @ [phone]

## 2015-04-21 NOTE — Telephone Encounter (Signed)
I don't see anything in Dr. Amparo Bristol note about  neuroopthalmology referral - did they discuss this or was this mentioned by another provider?

## 2015-04-23 NOTE — Telephone Encounter (Signed)
I called patient back and she said that Dr. Kellie Moor office recommended this.  She said that she would call them back for a referral.

## 2015-04-30 ENCOUNTER — Ambulatory Visit (HOSPITAL_COMMUNITY): Admission: RE | Admit: 2015-04-30 | Payer: 59 | Source: Ambulatory Visit

## 2015-05-06 ENCOUNTER — Ambulatory Visit: Payer: 59 | Admitting: Neurology

## 2015-05-07 ENCOUNTER — Ambulatory Visit (HOSPITAL_COMMUNITY): Admission: RE | Admit: 2015-05-07 | Payer: 59 | Source: Ambulatory Visit

## 2015-05-13 ENCOUNTER — Ambulatory Visit (HOSPITAL_COMMUNITY)
Admission: RE | Admit: 2015-05-13 | Discharge: 2015-05-13 | Disposition: A | Discharge status: Home / Self Care | Payer: 59 | Source: Ambulatory Visit | Attending: Neurology | Admitting: Neurology

## 2015-05-13 DIAGNOSIS — C439 Malignant melanoma of skin, unspecified: Secondary | ICD-10-CM | POA: Diagnosis not present

## 2015-05-13 DIAGNOSIS — R404 Transient alteration of awareness: Secondary | ICD-10-CM

## 2015-05-13 DIAGNOSIS — R930 Abnormal findings on diagnostic imaging of skull and head, not elsewhere classified: Secondary | ICD-10-CM | POA: Insufficient documentation

## 2015-05-13 DIAGNOSIS — R9089 Other abnormal findings on diagnostic imaging of central nervous system: Secondary | ICD-10-CM

## 2015-05-13 MED ORDER — GADOBENATE DIMEGLUMINE 529 MG/ML IV SOLN
10.0000 mL | Freq: Once | INTRAVENOUS | Status: AC | PRN
  Administered 2015-05-13: 10 mL via INTRAVENOUS

## 2015-05-15 ENCOUNTER — Encounter: Payer: Self-pay | Admitting: Radiation Therapy

## 2015-05-18 ENCOUNTER — Telehealth: Payer: Self-pay | Admitting: Neurology

## 2015-05-18 NOTE — Telephone Encounter (Signed)
Aida called from [facility] and wanted to get the office notes from pts visit on 04/06/2015/Dawn [phone]

## 2015-05-18 NOTE — Telephone Encounter (Signed)
OV note faxed

## 2015-05-19 ENCOUNTER — Other Ambulatory Visit: Payer: Self-pay | Admitting: Radiation Therapy

## 2015-05-19 DIAGNOSIS — C7931 Secondary malignant neoplasm of brain: Secondary | ICD-10-CM

## 2015-06-08 ENCOUNTER — Ambulatory Visit (INDEPENDENT_AMBULATORY_CARE_PROVIDER_SITE_OTHER): Payer: 59 | Admitting: Neurology

## 2015-06-08 ENCOUNTER — Encounter: Payer: Self-pay | Admitting: Neurology

## 2015-06-08 VITALS — BP 84/62 | HR 82 | Ht 67.0 in | Wt 104.0 lb

## 2015-06-08 DIAGNOSIS — Z0279 Encounter for issue of other medical certificate: Secondary | ICD-10-CM

## 2015-06-08 DIAGNOSIS — R51 Headache: Secondary | ICD-10-CM

## 2015-06-08 DIAGNOSIS — R519 Headache, unspecified: Secondary | ICD-10-CM

## 2015-06-08 DIAGNOSIS — R930 Abnormal findings on diagnostic imaging of skull and head, not elsewhere classified: Secondary | ICD-10-CM

## 2015-06-08 DIAGNOSIS — H53462 Homonymous bilateral field defects, left side: Secondary | ICD-10-CM | POA: Diagnosis not present

## 2015-06-08 DIAGNOSIS — R9089 Other abnormal findings on diagnostic imaging of central nervous system: Secondary | ICD-10-CM

## 2015-06-08 NOTE — Progress Notes (Signed)
NEUROLOGY FOLLOW UP OFFICE NOTE  [name] [phone]  HISTORY OF PRESENT ILLNESS: I had the pleasure of seeing [name] [name] in follow-up in the neurology clinic on 06/08/2015.  The patient was last seen 2 months ago for post-hospital care after an episode of altered mental status with abnormal brain MRI, considerations included post-ictal changes, PRES, limbic encephalitis. CSF studies normal. She is again accompanied by her husband who helps supplement the history today.  Records and images were personally reviewed where available. She had a follow-up MRI brain with and without contrast which I personally showing abnormal cortically based diffusion signal in the posterior right occipital lobe and posterior lateral right temporal lobe, corresponding to persistent cortically based increased FLAIR signal in this region, interval increased subcortical and central white matter FLAIR hyperintensity. There is no definite post-contrast enhancement seen. There is associated ex vacuo enlargement of the right occipital and temporal horns. The previously seen hyperintensity in the left posterior hemisphere has resolved without volume loss. The volume loss and lack of mass effect in the right temporal region argues against metastatic melanoma, instead laminar necrosis is favored. The etiology is uncertain, however evolution in the right posterior hemisphere might indicate PRES progressed to infarction. No definite metastatic disease in the brain.  Since her last visit, she has seen her oncologist at [facility] and a repeat lumbar puncture was done for cytology which did not show malignant cells. 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 Due to visual field loss, she fell on her left side and fractured her left foot. She now sees Ortho. She continues to have constant daily low grade headaches that increase in  intensity where they are debilitating every few months. She had one this week, and has seen headache specialist Dr. Domingo Cocking. She tells me she is now on a higher dose of gabapentin '600mg'$  qhs. She is getting a lot of sleep. She is again very verbose in the office today, her husband states that she is "still spacey," and has a had time focusing. She does not finish things she has started, she is more impatient. He has also noticed that she is not worried about bills and "stuff that matter, she does not see as a big concern." She has been scheduled to see Palliative Medicine at [facility] as well.  She denies any olfactory/gustatory hallucinations, deja vu, rising epigastric sensation, myoclonic jerks. She had a normal birth and early development. There is no history of febrile convulsions, significant traumatic brain injury, or family history of seizures. She is concerned about poor weight gain and poor appetite.  HPI: This is a 54 yo RH woman with a history of metastatic melanoma with brain metastasis s/p craniotomy, with negative MRI brain scans at [place] (most recently 02/25/15), in her usual state of health until 03/21/15 when she presented to [facility] ER with altered mental status. She and her husband went camping from 4/13 to 4/17. She recalls having a bad headache and was up all night. She did not recall much of the drive back, but apparently her husband had difficulty arousing her, then she was just looking at him, babbling, diaphoretic. She was brought to [facility] ER where she was evaluated by Neurology, per notes she would have intermittent periods where she would become lucid, then become non-verbal with eyes staring off to either side. There is note that she had diarrhea and nausea the day prior. On  exam, she was oriented x 1, intermittently able to provide a history then becomes less responsive, intermittently following commands. She had left-sided neglect. She had an MRI brain with and without contrast which I  personally reviewed, there was prior right suboccipital craniotomy with encephalomalacia in the right cerebellar hemisphere. There was abnormal T2/FLAIR signal with edema involving predominantly the cortical gray matter of the bilateral temporal occipital regions, right greater than left, with involvement of the mesial right temporal lobe, as well as probable increased signal in the left mesial temporal lobe. Considerations included post-ictal changes, PRES, limbic encephalitis. Her routine wake and sleep EEG was abnormal due to mild diffuse slowing. She had a lumbar puncture with CSF WBC 0, RBC 2, protein 68, glucose 98. HSV, Lyme, West Nile, Enterovirus, gram stain and culture were negative. Serum Lyme and RMSF were negative. Her B12, Hepatitis panel, HIV Ab were negative. TSH elevated at 7.001. Ammonia level initially 77, down to 32 the next day. TPO Ab, NMDA Ab negative.   PAST MEDICAL HISTORY: Past Medical History  Diagnosis Date  . TMJ (dislocation of temporomandibular joint)   . Thyroid disease   . Lung cancer     awaiting pathology  . Brain cancer     awaiting pathology; assuming this is a lung primary with brain mets  . Dementia   . Hypothyroidism   . GERD (gastroesophageal reflux disease)   . Anxiety     MEDICATIONS: Current Outpatient Prescriptions on File Prior to Visit  Medication Sig Dispense Refill  . citalopram (CELEXA) 20 MG tablet Take 20 mg by mouth every morning.     . docusate sodium (COLACE) 100 MG capsule Take 1 capsule (100 mg total) by mouth every 12 (twelve) hours. 14 capsule 1  . esomeprazole (NEXIUM) 20 MG capsule Take 20 mg by mouth daily at 12 noon.    . gabapentin (NEURONTIN) 300 MG capsule Take 600 mg by mouth at bedtime.     . hydrocortisone 2.5 % cream Apply 1 application topically as needed (for rash., itching).    Marland Kitchen levothyroxine (SYNTHROID, LEVOTHROID) 100 MCG tablet Take 100 mcg by mouth daily before breakfast.    . LORazepam (ATIVAN) 1 MG tablet Take 1  tablet (1 mg total) by mouth every 12 (twelve) hours as needed for anxiety or sleep. 60 tablet 0  . traMADol (ULTRAM) 50 MG tablet Take 1 tablet (50 mg total) by mouth every 12 (twelve) hours as needed for moderate pain. 50 tablet 0   No current facility-administered medications on file prior to visit.    ALLERGIES: Allergies  Allergen Reactions  . Morphine And Related Nausea Only  . Scopolamine Rash    FAMILY HISTORY: Family History  Problem Relation Age of Onset  . Dementia Mother   . Cancer - Other Other     SOCIAL HISTORY: History   Social History  . Marital Status: Married    Spouse Name: N/A  . Number of Children: N/A  . Years of Education: N/A   Occupational History  . Not on file.   Social History Main Topics  . Smoking status: Former Smoker    Quit date: 11/19/2013  . Smokeless tobacco: Never Used  . Alcohol Use: No  . Drug Use: No  . Sexual Activity: Not on file   Other Topics Concern  . Not on file   Social History Narrative    REVIEW OF SYSTEMS: Constitutional: No fevers, chills, or sweats, + generalized fatigue, change in appetite Eyes: No  visual changes, double vision, eye pain Ear, nose and throat: No hearing loss, ear pain, nasal congestion, sore throat Cardiovascular: No chest pain, palpitations Respiratory:  No shortness of breath at rest or with exertion, wheezes GastrointestinaI: No nausea, vomiting, diarrhea, abdominal pain, fecal incontinence Genitourinary:  No dysuria, urinary retention or frequency Musculoskeletal:  No neck pain, back pain Integumentary: No rash, pruritus, skin lesions Neurological: as above Psychiatric: No depression, insomnia, anxiety Endocrine: No palpitations, fatigue, diaphoresis, mood swings, change in appetite,+ change in weight,no increased thirst Hematologic/Lymphatic:  No anemia, purpura, petechiae. Allergic/Immunologic: no itchy/runny eyes, nasal congestion, recent allergic reactions, rashes  PHYSICAL  EXAM: Filed Vitals:   06/08/15 1510  BP: 84/62  Pulse: 82   General: No acute distress, again very verbose and needs redirection Head:  Normocephalic/atraumatic Neck: supple, no paraspinal tenderness, full range of motion Heart:  Regular rate and rhythm Lungs:  Clear to auscultation bilaterally Back: No paraspinal tenderness Skin/Extremities: No rash, no edema Neurological Exam: alert and oriented to person, place, and time. No aphasia or dysarthria. Fund of knowledge is appropriate.  Recent and remote memory are intact.  Attention and concentration are normal.    Able to name objects and repeat phrases. Cranial nerves: CN I: not tested CN II: pupils equal, round and reactive to light, left homonymous hemianopia (similar to prior), fundi unremarkable. CN III, IV, VI: full range of motion, no nystagmus, no ptosis CN V: facial sensation intact CN VII: upper and lower face symmetric CN VIII: hearing intact to finger rub CN IX, X: gag intact, uvula midline CN XI: sternocleidomastoid and trapezius muscles intact CN XII: tongue midline Bulk & Tone: normal, no fasciculations. Motor: 5/5 throughout with no pronator drift. Sensation: intact to light touch. No extinction to double simultaneous stimulation. Romberg test negative Deep Tendon Reflexes: brisk +2 throughout, no ankle clonus Plantar responses: downgoing bilaterally Cerebellar: no incoordination on finger to nose, heel to shin. No dysdiadochokinesia Gait: better than last visit, narrow-based and steady, can tandem walk adequately Tremor: none  IMPRESSION: This is a 54 yo RH woman with a history of metastatic melanoma to the brain s/p suboccipital craniotomy, who had an episode of altered mental status last 03/21/15. Her MRI brain at that time was abnormal with increased signal in the bilateral temporal occipital regions, right greater than left, with involvement of the mesial temporal lobes. A follow-up MRI done last month showed  resolution of changes on the left, however the right posterior temporal and occipital lobe signal changes have progressed with superimposed cortical volume loss, no enhancement. Per neuroradiology, the volume loss and lack of mass effect was felt to argue against metastatic melanoma, laminar necrosis was favored, possibly indicating PRES which progressed to infarction. No definite metastatic disease in the brain.  I explained to the patient and her husband persistent changes causing the left homonymous hemianopia. She will be referred for occupational therapy. She will bring a copy of the report and disc of MRI images to her oncologist this week for review, to weigh in on these changes. Repeat LP done at [facility] did not show malignancy. She follows with the [facility] for chronic headaches and will call Dr. Domingo Cocking regarding gabapentin dose. [place] driving laws were discussed with the patient, and she knows to stop driving after an episode of loss of awareness, until 6 months event-free. She will follow-up in 4 months.  Thank you for allowing me to participate in her care.  Please do not hesitate to call for  any questions or concerns.  The duration of this appointment visit was 28 minutes of face-to-face time with the patient.  Greater than 50% of this time was spent in counseling, explanation of diagnosis, planning of further management, and coordination of care.   Ellouise Newer, M.D.   CC: Dr. Olen Pel, Dr. Raynelle Chary

## 2015-06-08 NOTE — Patient Instructions (Signed)
1. Refer to occupational therapy for left homonymous hemianopia 2. Continue gabapentin '600mg'$  at bedtime, follow-up with Dr. Domingo Cocking for the headaches 3. Bring disc and report of MRI to your oncologist for their review 4. Follow-up in 4 months

## 2015-06-09 ENCOUNTER — Encounter: Payer: Self-pay | Admitting: Neurology

## 2015-06-09 DIAGNOSIS — H53462 Homonymous bilateral field defects, left side: Secondary | ICD-10-CM | POA: Insufficient documentation

## 2015-06-09 DIAGNOSIS — R51 Headache: Secondary | ICD-10-CM

## 2015-06-09 DIAGNOSIS — R519 Headache, unspecified: Secondary | ICD-10-CM | POA: Insufficient documentation

## 2015-06-10 ENCOUNTER — Other Ambulatory Visit: Payer: Self-pay | Admitting: Family Medicine

## 2015-06-10 DIAGNOSIS — H53462 Homonymous bilateral field defects, left side: Secondary | ICD-10-CM

## 2015-07-02 ENCOUNTER — Ambulatory Visit: Payer: 59 | Admitting: Occupational Therapy

## 2015-07-06 ENCOUNTER — Ambulatory Visit: Payer: Self-pay | Admitting: Radiation Oncology

## 2015-07-07 ENCOUNTER — Ambulatory Visit: Payer: 59 | Admitting: Occupational Therapy

## 2015-07-10 ENCOUNTER — Emergency Department (HOSPITAL_COMMUNITY)
Admission: EM | Admit: 2015-07-10 | Discharge: 2015-07-10 | Disposition: A | Discharge status: Home / Self Care | Payer: 59 | Attending: Emergency Medicine | Admitting: Emergency Medicine

## 2015-07-10 ENCOUNTER — Encounter (HOSPITAL_COMMUNITY): Payer: Self-pay | Admitting: Emergency Medicine

## 2015-07-10 DIAGNOSIS — E876 Hypokalemia: Secondary | ICD-10-CM | POA: Diagnosis not present

## 2015-07-10 DIAGNOSIS — E039 Hypothyroidism, unspecified: Secondary | ICD-10-CM | POA: Diagnosis not present

## 2015-07-10 DIAGNOSIS — Z79899 Other long term (current) drug therapy: Secondary | ICD-10-CM | POA: Diagnosis not present

## 2015-07-10 DIAGNOSIS — R112 Nausea with vomiting, unspecified: Secondary | ICD-10-CM | POA: Insufficient documentation

## 2015-07-10 DIAGNOSIS — F0390 Unspecified dementia without behavioral disturbance: Secondary | ICD-10-CM | POA: Insufficient documentation

## 2015-07-10 DIAGNOSIS — F419 Anxiety disorder, unspecified: Secondary | ICD-10-CM | POA: Diagnosis not present

## 2015-07-10 DIAGNOSIS — Z85118 Personal history of other malignant neoplasm of bronchus and lung: Secondary | ICD-10-CM | POA: Diagnosis not present

## 2015-07-10 DIAGNOSIS — R197 Diarrhea, unspecified: Secondary | ICD-10-CM

## 2015-07-10 DIAGNOSIS — Z87891 Personal history of nicotine dependence: Secondary | ICD-10-CM | POA: Diagnosis not present

## 2015-07-10 DIAGNOSIS — K219 Gastro-esophageal reflux disease without esophagitis: Secondary | ICD-10-CM | POA: Insufficient documentation

## 2015-07-10 DIAGNOSIS — Z85841 Personal history of malignant neoplasm of brain: Secondary | ICD-10-CM | POA: Insufficient documentation

## 2015-07-10 DIAGNOSIS — N390 Urinary tract infection, site not specified: Secondary | ICD-10-CM | POA: Diagnosis not present

## 2015-07-10 LAB — COMPREHENSIVE METABOLIC PANEL
ALK PHOS: 94 U/L (ref 38–126)
ALT: 16 U/L (ref 14–54)
ANION GAP: 17 — AB (ref 5–15)
AST: 27 U/L (ref 15–41)
Albumin: 4.1 g/dL (ref 3.5–5.0)
BUN: 7 mg/dL (ref 6–20)
CO2: 21 mmol/L — AB (ref 22–32)
Calcium: 10.9 mg/dL — ABNORMAL HIGH (ref 8.9–10.3)
Chloride: 96 mmol/L — ABNORMAL LOW (ref 101–111)
Creatinine, Ser: 0.66 mg/dL (ref 0.44–1.00)
GFR calc non Af Amer: >60 mL/min (ref >60)
Glucose, Bld: 76 mg/dL (ref 65–99)
POTASSIUM: 3.1 mmol/L — AB (ref 3.5–5.1)
SODIUM: 134 mmol/L — AB (ref 135–145)
TOTAL PROTEIN: 7.4 g/dL (ref 6.5–8.1)
Total Bilirubin: 1 mg/dL (ref 0.3–1.2)

## 2015-07-10 LAB — CBC
HCT: 45.9 % (ref 36.0–46.0)
Hemoglobin: 15.6 g/dL — ABNORMAL HIGH (ref 12.0–15.0)
MCH: 30 pg (ref 26.0–34.0)
MCHC: 34 g/dL (ref 30.0–36.0)
MCV: 88.3 fL (ref 78.0–100.0)
PLATELETS: 307 10*3/uL (ref 150–400)
RBC: 5.2 MIL/uL — AB (ref 3.87–5.11)
RDW: 14.7 % (ref 11.5–15.5)
WBC: 6.3 10*3/uL (ref 4.0–10.5)

## 2015-07-10 LAB — URINALYSIS, ROUTINE W REFLEX MICROSCOPIC
Bilirubin Urine: NEGATIVE
Glucose, UA: NEGATIVE mg/dL
Hgb urine dipstick: NEGATIVE
KETONES UR: 40 mg/dL — AB
NITRITE: NEGATIVE
PH: 5.5 (ref 5.0–8.0)
Protein, ur: NEGATIVE mg/dL
Specific Gravity, Urine: 1.005 (ref 1.005–1.030)
UROBILINOGEN UA: 0.2 mg/dL (ref 0.0–1.0)

## 2015-07-10 LAB — URINE MICROSCOPIC-ADD ON

## 2015-07-10 LAB — LIPASE, BLOOD: Lipase: 14 U/L — ABNORMAL LOW (ref 22–51)

## 2015-07-10 MED ORDER — PROMETHAZINE HCL 25 MG/ML IJ SOLN
25.0000 mg | Freq: Four times a day (QID) | INTRAMUSCULAR | Status: DC | PRN
  Administered 2015-07-10: 25 mg via INTRAVENOUS
  Filled 2015-07-10: qty 1

## 2015-07-10 MED ORDER — ONDANSETRON 4 MG PO TBDP: 4.0000 mg | ORAL_TABLET | Freq: Once | ORAL | Status: DC | PRN

## 2015-07-10 MED ORDER — PROMETHAZINE HCL 25 MG PO TABS: 25.0000 mg | ORAL_TABLET | Freq: Four times a day (QID) | ORAL | Status: DC | PRN

## 2015-07-10 MED ORDER — PROMETHAZINE HCL 25 MG RE SUPP: 25.0000 mg | Freq: Four times a day (QID) | RECTAL | Status: DC | PRN

## 2015-07-10 MED ORDER — SODIUM CHLORIDE 0.9 % IV BOLUS (SEPSIS)
1000.0000 mL | Freq: Once | INTRAVENOUS | Status: AC
  Administered 2015-07-10: 1000 mL via INTRAVENOUS

## 2015-07-10 MED ORDER — DEXTROSE 5 % IV SOLN
1.0000 g | Freq: Once | INTRAVENOUS | Status: AC
  Administered 2015-07-10: 1 g via INTRAVENOUS
  Filled 2015-07-10: qty 10

## 2015-07-10 MED ORDER — POTASSIUM CHLORIDE CRYS ER 20 MEQ PO TBCR
40.0000 meq | EXTENDED_RELEASE_TABLET | Freq: Once | ORAL | Status: AC
  Administered 2015-07-10: 40 meq via ORAL
  Filled 2015-07-10: qty 2

## 2015-07-10 MED ORDER — CEPHALEXIN 500 MG PO CAPS: 500.0000 mg | ORAL_CAPSULE | Freq: Four times a day (QID) | ORAL | Status: DC

## 2015-07-10 NOTE — ED Notes (Addendum)
Pt requesting IV phenergan-- states "I know that works best" I have intractable nausea and vomiting"

## 2015-07-10 NOTE — ED Notes (Signed)
Has not eaten anything in 10 days, vomiting and diarrhea -- started about same time--- states now unable to keep anything down and having watery diarrhea-- states has had c-diff in the past and this does not seem like that.

## 2015-07-10 NOTE — ED Notes (Signed)
Discharge instructions and prescriptions given and reviewed with patient.  Patient verbalized understanding to take medications as directed and to follow up with PMD as needed.  Patient discharged home in good condition.

## 2015-07-10 NOTE — ED Notes (Signed)
Attempted IV x 2 without success.  Patient refusing Zofran; states she wants Phenergan because it works better.

## 2015-07-10 NOTE — ED Provider Notes (Signed)
CSN: [id number]     Arrival date & time 07/10/15  1203 History   First MD Initiated Contact with Patient 07/10/15 1237     Chief Complaint  Patient presents with  . Nausea  . Emesis  . cancer patient      (Consider location/radiation/quality/duration/timing/severity/associated sxs/prior Treatment) HPI  Pt presenting with c/o vomiting and diarrhea.  States she has had these symptoms for the past 10 days.  She has been drinking water and keeping some of it down, but not able to eat solid foods.  No fever/chills.  Some cramping abdominal pain with diarrhea.  Stools are watery, no blood or mucous.  No sick contacts.  She has hx of metastatic lung cancer s/p radiation and immune modulation therapy- she is not currently on treatment, she is followed at [facility], getting followup scans every 3 months now.  No difficulty breathing.  No fainting.  There are no other associated systemic symptoms, there are no other alleviating or modifying factors.   Past Medical History  Diagnosis Date  . TMJ (dislocation of temporomandibular joint)   . Thyroid disease   . Lung cancer     awaiting pathology  . Brain cancer     awaiting pathology; assuming this is a lung primary with brain mets  . Dementia   . Hypothyroidism   . GERD (gastroesophageal reflux disease)   . Anxiety    Past Surgical History  Procedure Laterality Date  . Tonsillectomy    . Appendectomy    . Suboccipital craniectomy cervical laminectomy N/A 11/19/2013    Procedure: SUBOCCIPITAL CRANIECTOMY for tumor;  Surgeon: Eustace Moore, MD;  Location: [place] [facility];  Service: Neurosurgery;  Laterality: N/A;  . Lumbar wound debridement N/A 12/05/2013    Procedure: irrigation and debridement posterior cervical wound;  Surgeon: Eustace Moore, MD;  Location: [place] [facility];  Service: Neurosurgery;  Laterality: N/A;  . Video assisted thoracoscopy (vats)/wedge resection Left 03/05/2014    Procedure: VIDEO ASSISTED THORACOSCOPY (VATS)/WEDGE RESECTION;   Surgeon: Melrose Nakayama, MD;  Location: [place];  Service: Thoracic;  Laterality: Left;   Family History  Problem Relation Age of Onset  . Dementia Mother   . Cancer - Other Other    Social History  Substance Use Topics  . Smoking status: Former Smoker    Quit date: 11/19/2013  . Smokeless tobacco: Never Used  . Alcohol Use: No   OB History    No data available     Review of Systems  ROS reviewed and all otherwise negative except for mentioned in HPI    Allergies  Morphine and related and Scopolamine  Home Medications   Prior to Admission medications   Medication Sig Start Date End Date Taking? Authorizing Provider  Biotin 1 MG CAPS Take 1 capsule by mouth daily.    Yes Historical Provider, MD  citalopram (CELEXA) 20 MG tablet Take 20 mg by mouth every morning.  07/14/14 07/14/15 Yes Historical Provider, MD  docusate sodium (COLACE) 100 MG capsule Take 1 capsule (100 mg total) by mouth every 12 (twelve) hours. 04/02/15  Yes Fredia Sorrow, MD  esomeprazole (NEXIUM) 20 MG capsule Take 20 mg by mouth daily at 12 noon.   Yes Historical Provider, MD  gabapentin (NEURONTIN) 100 MG capsule Take 100 mg by mouth as needed (breakthrough pain).   Yes Historical Provider, MD  gabapentin (NEURONTIN) 600 MG tablet Take 600 mg by mouth at bedtime.   Yes Historical Provider, MD  levothyroxine (SYNTHROID, LEVOTHROID)  112 MCG tablet Take 112 mcg by mouth daily before breakfast.   Yes Historical Provider, MD  LORazepam (ATIVAN) 1 MG tablet Take 1 tablet (1 mg total) by mouth every 12 (twelve) hours as needed for anxiety or sleep. 05/03/14  Yes Robbie Lis, MD  mirtazapine (REMERON) 15 MG tablet Take 15 mg by mouth at bedtime.  06/29/15  Yes Historical Provider, MD  ondansetron (ZOFRAN-ODT) 8 MG disintegrating tablet Take 8 mg by mouth every 8 (eight) hours as needed for nausea or vomiting.    Yes Historical Provider, MD  oxyCODONE-acetaminophen (PERCOCET/ROXICET) 5-325 MG per tablet Take 1  tablet by mouth every 4 (four) hours as needed for severe pain.   Yes Historical Provider, MD  traMADol (ULTRAM) 50 MG tablet Take 1 tablet (50 mg total) by mouth every 12 (twelve) hours as needed for moderate pain. 12/01/14  Yes Tyler Pita, MD  triamcinolone ointment (KENALOG) 0.1 % Apply 1 application topically 2 (two) times daily.  06/16/15 06/15/16 Yes Historical Provider, MD  cephALEXin (KEFLEX) 500 MG capsule Take 1 capsule (500 mg total) by mouth 4 (four) times daily. 07/10/15   Alfonzo Beers, MD  promethazine (PHENERGAN) 25 MG suppository Place 1 suppository (25 mg total) rectally every 6 (six) hours as needed for nausea or vomiting. 07/10/15   Alfonzo Beers, MD  promethazine (PHENERGAN) 25 MG tablet Take 1 tablet (25 mg total) by mouth every 6 (six) hours as needed for nausea or vomiting. 07/10/15   Alfonzo Beers, MD   BP 114/77 mmHg  Pulse 82  Temp(Src) 97.5 F (36.4 C) (Oral)  Resp 18  Ht '5\' 7"'$  (1.702 m)  Wt 89 lb (40.37 kg)  BMI 13.94 kg/m2  SpO2 100%  Vitals reviewed Physical Exam  Physical Examination: General appearance - alert, cachectic appearing, and in no distress Mental status - alert, oriented to person, place, and time Eyes - no conjunctival injection, no scleral icterus Mouth - mucous membranes tacky, OP without lesions Chest - clear to auscultation, no wheezes, rales or rhonchi, symmetric air entry Heart - normal rate, regular rhythm, normal S1, S2, no murmurs, rubs, clicks or gallops Abdomen - soft, nontender, nondistended, no masses or organomegaly Neurological - alert, oriented, normal speech, no focal findings or movement disorder noted Extremities - peripheral pulses normal, no pedal edema, no clubbing or cyanosis Skin - normal coloration and turgor, no rashes  ED Course  Procedures (including critical care time) Labs Review Labs Reviewed  LIPASE, BLOOD - Abnormal; Notable for the following:    Lipase 14 (*)    All other components within normal limits   COMPREHENSIVE METABOLIC PANEL - Abnormal; Notable for the following:    Sodium 134 (*)    Potassium 3.1 (*)    Chloride 96 (*)    CO2 21 (*)    Calcium 10.9 (*)    Anion gap 17 (*)    All other components within normal limits  CBC - Abnormal; Notable for the following:    RBC 5.20 (*)    Hemoglobin 15.6 (*)    All other components within normal limits  URINALYSIS, ROUTINE W REFLEX MICROSCOPIC (NOT AT [facility]) - Abnormal; Notable for the following:    APPearance HAZY (*)    Ketones, ur 40 (*)    Leukocytes, UA SMALL (*)    All other components within normal limits  URINE MICROSCOPIC-ADD ON - Abnormal; Notable for the following:    Squamous Epithelial / LPF FEW (*)    Bacteria, UA  FEW (*)    All other components within normal limits  URINE CULTURE  STOOL CULTURE    Imaging Review No results found. Dagoberto Ligas, personally reviewed and evaluated these images and lab results as part of my medical decision-making.   EKG Interpretation None      MDM   Final diagnoses:  Nausea vomiting and diarrhea  UTI (lower urinary tract infection)  Hypokalemia    Pt presenting with c/o nausea, vomiting, diarrhea for the past 10 days.  Pt feels improved after IV fluids and phenergan.  She has hypokalemia- mild- which was repleted in the ED.  Her UA was also c/w UTI- she was started on rocephin and discharged with rx for keflex as well as phenergan.  Discharged with strict return precautions.  Pt agreeable with plan.    Alfonzo Beers, MD 07/13/15 [phone]

## 2015-07-12 LAB — URINE CULTURE: Culture: >=100000

## 2015-07-13 ENCOUNTER — Telehealth (HOSPITAL_COMMUNITY): Payer: Self-pay | Admitting: *Deleted

## 2015-07-20 ENCOUNTER — Ambulatory Visit: Payer: 59 | Attending: Neurology | Admitting: Occupational Therapy

## 2015-07-20 DIAGNOSIS — H53462 Homonymous bilateral field defects, left side: Secondary | ICD-10-CM

## 2015-07-20 DIAGNOSIS — R2681 Unsteadiness on feet: Secondary | ICD-10-CM

## 2015-07-20 DIAGNOSIS — H539 Unspecified visual disturbance: Secondary | ICD-10-CM | POA: Diagnosis not present

## 2015-07-20 DIAGNOSIS — R4189 Other symptoms and signs involving cognitive functions and awareness: Secondary | ICD-10-CM

## 2015-07-21 NOTE — Therapy (Addendum)
[facility] [address] [place], [place], [address] Phone: [phone]   Fax:  [phone]  Occupational Therapy Evaluation  Patient Details  Name: [name] [name] MRN: [id number] Date of Birth: 10-20-1961 Referring Provider:  Cameron Sprang, MD  Encounter Date: 07/20/2015      OT End of Session - 07/20/15 1756    Visit Number 1   Number of Visits 17   Date for OT Re-Evaluation 09/18/15   Authorization Type [employer] 60 visit limit combined   OT Start Time 1403   OT Stop Time 1450   OT Time Calculation (min) 47 min   Activity Tolerance Patient tolerated treatment well   Behavior During Therapy Restless;Impulsive  at times      Past Medical History  Diagnosis Date  . TMJ (dislocation of temporomandibular joint)   . Thyroid disease   . Lung cancer     awaiting pathology  . Brain cancer     awaiting pathology; assuming this is a lung primary with brain mets  . Dementia   . Hypothyroidism   . GERD (gastroesophageal reflux disease)   . Anxiety     Past Surgical History  Procedure Laterality Date  . Tonsillectomy    . Appendectomy    . Suboccipital craniectomy cervical laminectomy N/A 11/19/2013    Procedure: SUBOCCIPITAL CRANIECTOMY for tumor;  Surgeon: Eustace Moore, MD;  Location: [place] [facility];  Service: Neurosurgery;  Laterality: N/A;  . Lumbar wound debridement N/A 12/05/2013    Procedure: irrigation and debridement posterior cervical wound;  Surgeon: Eustace Moore, MD;  Location: [place] [facility];  Service: Neurosurgery;  Laterality: N/A;  . Video assisted thoracoscopy (vats)/wedge resection Left 03/05/2014    Procedure: VIDEO ASSISTED THORACOSCOPY (VATS)/WEDGE RESECTION;  Surgeon: Melrose Nakayama, MD;  Location: [facility];  Service: Thoracic;  Laterality: Left;    There were no vitals filed for this visit.  Visit Diagnosis:  Visual disturbance - Plan: Ot plan of care cert/re-cert  Left homonymous  hemianopsia - Plan: Ot plan of care cert/re-cert  Unsteadiness - Plan: Ot plan of care cert/re-cert  Cognitive deficits - Plan: Ot plan of care cert/re-cert      Subjective Assessment - 07/20/15 1419    Subjective  "They don't know what caused this"  Pt reports dizzness, balance problems, and vision changes 04/09/14.  (Reports weakness prior to 04/10/15)   Pertinent History hx of metastic melanoma with brain metastasis s/p craniotomy 10/2013, lung resection 2015, altered mental status 03/21/15 per Epic notes with vision deficits and MRI changes, hx of L foot fx 5/16 **See Epic Notes for Details   Limitations --   Patient Stated Goals improve vision   Currently in Pain? Yes   Pain Score 7    Pain Location Head   Pain Descriptors / Indicators Dull   Pain Frequency Constant   Aggravating Factors  loud    Pain Relieving Factors medication   Effect of Pain on Daily Activities --  OT will not address due to location           [facility] OT Assessment - 07/21/15 0001    Assessment   Diagnosis L homonymous hemianopsia   Onset Date 04/10/15   Prior Therapy none   Precautions   Precautions Fall  L homonymous hemianopsia   Balance Screen   Has the patient fallen in the past 6 months Yes   How many times? [place]  expects to be discharged to: Private residence   Home Layout One level   Cytogeneticist   Lives With Significant other   Prior Function   Level of Independence Independent with basic ADLs;Independent with community mobility without device;Independent with homemaking with ambulation   Vocation On disability   ADL   Upper Body Bathing Minimal assistance  for hair   Tub/Shower Transfer Minimal assistance   ADL comments BADLs mod I except above.   IADL   Prior Level of Function Light Housekeeping independent   Light Housekeeping --  performs light tasks    Prior Level of Function Meal Prep independent   Meal Prep --  not performing    Prior Level of Function Community Mobility drove independently   [place] on family or friends for transportation   Medication Management Is responsible for taking medication in correct dosages at correct time   Prior Level of Function Research officer, political party financial matters independently (budgets, writes checks, pays rent, bills goes to bank), collects and keeps track of income   Mobility   Mobility Status History of falls   Written Expression   Dominant Hand Right   Vision - History   Baseline Vision Wears glasses all the time   Vision Assessment   Tracking/Visual Pursuits Impaired - to be further tested in functional context  difficulty tracking superiorly and to the L (L>R)   Visual Fields Left homonymous Hemainpsia  only sees approx 25% to the L of midline per gross testing   Comment Pt performed simple tabletop visual scanning with approx 99% accuracy and simple environemental scanning with approx 30% accuracy an needed min-mod cueing to avoid bumping into objects in static, minimally distracting environment   Cognition   Overall Cognitive Status No family/caregiver present to determine baseline cognitive functioning  verbose, decr safety awareness   Memory Impaired   Memory Impairment Decreased short term memory  per pt report   Awareness Impaired   Awareness Impairment Emergent impairment  unable to fully anticipate/compensate for difficulty   Behaviors Impulsive  at times, very verbose   Coordination   9 Hole Peg Test Right;Left   Right 9 Hole Peg Test 28.22   Left 9 Hole Peg Test 29.06 likely impacted due to vision (knocked several in floor x2 and did not realize x1)   Perception   Perception Impaired   Comments Ambulates too close to items on R side and needed cues for safety   AROM   Overall AROM  Within functional limits for tasks performed   Strength   Overall Strength Deficits   Overall Strength  Comments Grossly 4 to 4+/5 BUE proximal strength   Hand Function   Right Hand Grip (lbs) 35   Left Hand Grip (lbs) 29                           OT Short Term Goals - 07/21/15 2993    OT SHORT TERM GOAL #1   Title Pt will be independent with HEP for vision.--check STGs 08/19/15   Time 4   Period Weeks   Status New   OT SHORT TERM GOAL #2   Title Pt will verbalize understanding of visual compensation strategies.   Time 4   Period Weeks   Status New   OT SHORT TERM GOAL #3   Title Pt will perform a variety of complex tabletop scanning activities with at  least 95% accuracy or greater.   Time 4   Period Weeks   Status New   OT SHORT TERM GOAL #4   Title Pt will perform simple environmental scanning/navigation with at least 60% accuracy without cues safely.   Time 4   Period Weeks   Status New           OT Long Term Goals - 07/21/15 0817    OT LONG TERM GOAL #1   Title Pt will verbalized understanding of memory compensations strategies for ADLs prn.--check LTGs 09/18/15   Time 8   Period Weeks   Status New   OT LONG TERM GOAL #2   Title Pt will perform simple environmental scanning/navigation with at least 90% accuracy.   Time 8   Period Weeks   Status New   OT LONG TERM GOAL #3   Title Pt will perform simple cooking task with good safety.   Time 8   Period Weeks   Status New               Plan - 07/20/15 1757    Clinical Impression Statement Pt with hx of metastic melanoma with brain metastasis s/p craniotomy 10/2013 presents today with L homonymous hemianopsia following episode of altered mental status.  Pt presents with visual deficits, cognitive deficits, decr balance affecting ADL and IADL performance.  Pt would benefit from occupational therapy for compensation strategies and to incr safety and independence with ADLs/IADLs due to cognitive and visual defictis.   Pt will benefit from skilled therapeutic intervention in order to improve on  the following deficits (Retired) Decreased cognition;Impaired perceived functional ability;Impaired vision/preception;Decreased knowledge of precautions;Decreased safety awareness;Decreased coordination;Decreased balance   Rehab Potential Good   Clinical Impairments Affecting Rehab Potential decr balance, cognitive deficits   OT Frequency 2x / week   OT Duration 8 weeks  +Eval   OT Treatment/Interventions Self-care/ADL training;Therapeutic exercise;Patient/family education;Neuromuscular education;Therapeutic activities;DME and/or AE instruction;Cognitive remediation/compensation;Visual/perceptual remediation/compensation   Plan visual scanning activities, visual compensation strategies   Recommended Other Services physical therapy due to multiple falls (in-basket message sent to MD 07/21/15)   Consulted and Agree with Plan of Care Patient        Problem List Patient Active Problem List   Diagnosis Date Noted  . Left homonymous hemianopsia 06/09/2015  . Chronic daily headache 06/09/2015  . Abnormal brain MRI 04/06/2015  . Awareness alteration, transient 04/06/2015  . Other headache syndrome   . Altered mental status 03/21/2015  . Acute encephalopathy 03/21/2015  . GERD (gastroesophageal reflux disease) 03/21/2015  . Anxiety 03/21/2015  . Dementia 03/21/2015  . Polypharmacy 03/21/2015  . Vomiting 04/30/2014  . Nausea vomiting and diarrhea 04/30/2014  . Hyperglycemia 04/30/2014  . Metastatic melanoma to lung 03/25/2014  . Protein-calorie malnutrition, severe 03/19/2014  . Colitis 03/17/2014  . Candidiasis of mouth 12/16/2013  . Wound infection after surgery 12/05/2013  . Solitary brain metastasis from melanoma 12/03/2013  . S/P craniotomy 11/19/2013  . Lung nodule 11/16/2013  . Cerebellar mass 11/14/2013  . Hypothyroid 11/14/2013  . Tobacco abuse 11/14/2013    [facility] 07/21/2015, 8:34 AM  [facility] [address] [place], [place], [address] Phone: [phone]   Fax:  [place], OTR/L 07/21/2015 8:34 AM

## 2015-07-28 ENCOUNTER — Telehealth: Payer: Self-pay | Admitting: Neurology

## 2015-07-28 NOTE — Telephone Encounter (Signed)
I spoke with patient. She states that she called her headache doctor and they wouldn't help her. She states she does already have Tramadol. She did see a physician today at her pcp's office and was given a Rx for Oxycodone, which she states now she feels some better. She wanted me to tell you thank you for all your help & concern.

## 2015-07-28 NOTE — Telephone Encounter (Signed)
Pt states that her Brain cancer is back and she is having a really bad headache today and would like to be seen today or have something for pain please call [phone]

## 2015-07-28 NOTE — Telephone Encounter (Signed)
Please advise 

## 2015-07-28 NOTE — Telephone Encounter (Signed)
We can offer her Toradol injection, otherwise we can only prescribe Tramadol for PO. She sees headache specialist Dr. Domingo Cocking for her headaches, pls have her call him as well.

## 2015-07-30 ENCOUNTER — Ambulatory Visit: Payer: 59 | Attending: Neurology | Admitting: Occupational Therapy

## 2015-07-30 DIAGNOSIS — H53462 Homonymous bilateral field defects, left side: Secondary | ICD-10-CM | POA: Insufficient documentation

## 2015-07-30 DIAGNOSIS — R531 Weakness: Secondary | ICD-10-CM | POA: Insufficient documentation

## 2015-07-30 DIAGNOSIS — R2681 Unsteadiness on feet: Secondary | ICD-10-CM | POA: Insufficient documentation

## 2015-07-30 DIAGNOSIS — R4189 Other symptoms and signs involving cognitive functions and awareness: Secondary | ICD-10-CM | POA: Insufficient documentation

## 2015-07-30 DIAGNOSIS — H539 Unspecified visual disturbance: Secondary | ICD-10-CM | POA: Insufficient documentation

## 2015-07-30 DIAGNOSIS — R279 Unspecified lack of coordination: Secondary | ICD-10-CM | POA: Insufficient documentation

## 2015-07-30 DIAGNOSIS — R269 Unspecified abnormalities of gait and mobility: Secondary | ICD-10-CM | POA: Insufficient documentation

## 2015-08-04 ENCOUNTER — Ambulatory Visit: Payer: 59 | Admitting: Occupational Therapy

## 2015-08-04 DIAGNOSIS — R4189 Other symptoms and signs involving cognitive functions and awareness: Secondary | ICD-10-CM | POA: Diagnosis present

## 2015-08-04 DIAGNOSIS — H53462 Homonymous bilateral field defects, left side: Secondary | ICD-10-CM | POA: Diagnosis present

## 2015-08-04 DIAGNOSIS — R2681 Unsteadiness on feet: Secondary | ICD-10-CM | POA: Diagnosis present

## 2015-08-04 DIAGNOSIS — H539 Unspecified visual disturbance: Secondary | ICD-10-CM

## 2015-08-04 DIAGNOSIS — R279 Unspecified lack of coordination: Secondary | ICD-10-CM

## 2015-08-04 DIAGNOSIS — R269 Unspecified abnormalities of gait and mobility: Secondary | ICD-10-CM | POA: Diagnosis present

## 2015-08-04 DIAGNOSIS — R531 Weakness: Secondary | ICD-10-CM | POA: Diagnosis present

## 2015-08-04 NOTE — Therapy (Signed)
[facility] [address] [place], [place], [address] Phone: [phone]   Fax:  [phone]  Occupational Therapy Treatment  Patient Details  Name: [name] [name] MRN: [id number] Date of Birth: Jun 19, 1961 Referring Provider:  Orpah Melter, MD  Encounter Date: 08/04/2015      OT End of Session - 08/04/15 1326    Visit Number 2   Number of Visits 17   Date for OT Re-Evaluation 09/18/15   Authorization Type UHC 60 visit limit combined   OT Start Time 1318   OT Stop Time 1400   OT Time Calculation (min) 42 min   Activity Tolerance Patient tolerated treatment well   Behavior During Therapy Restless;Impulsive  at times      Past Medical History  Diagnosis Date  . TMJ (dislocation of temporomandibular joint)   . Thyroid disease   . Lung cancer     awaiting pathology  . Brain cancer     awaiting pathology; assuming this is a lung primary with brain mets  . Dementia   . Hypothyroidism   . GERD (gastroesophageal reflux disease)   . Anxiety     Past Surgical History  Procedure Laterality Date  . Tonsillectomy    . Appendectomy    . Suboccipital craniectomy cervical laminectomy N/A 11/19/2013    Procedure: SUBOCCIPITAL CRANIECTOMY for tumor;  Surgeon: Eustace Moore, MD;  Location: [facility];  Service: Neurosurgery;  Laterality: N/A;  . Lumbar wound debridement N/A 12/05/2013    Procedure: irrigation and debridement posterior cervical wound;  Surgeon: Eustace Moore, MD;  Location: [facility];  Service: Neurosurgery;  Laterality: N/A;  . Video assisted thoracoscopy (vats)/wedge resection Left 03/05/2014    Procedure: VIDEO ASSISTED THORACOSCOPY (VATS)/WEDGE RESECTION;  Surgeon: Melrose Nakayama, MD;  Location: [facility];  Service: Thoracic;  Laterality: Left;    There were no vitals filed for this visit.  Visit Diagnosis:  Visual disturbance  Left homonymous hemianopsia  Cognitive deficits  Lack of  coordination      Subjective Assessment - 08/04/15 1320    Subjective  Pt reports that she is going to a radiation oncologist soon and her regular oncologist 08/14/15.  Pt reports that she had extremely low BP due to adrenal insufficiency and was hospitalized 2 nights last week.  Pt reports that she received OT/PT in the hospital.  Pt reports that she is on steriods now (but doesn't know name).  Pt reports incr difficulty with L hand coordination now.   Pertinent History hx of metastic melanoma with brain metastasis s/p craniotomy 10/2013, lung resection 2015, altered mental status 03/21/15 per Epic notes with vision deficits and MRI changes, hx of L foot fx 5/16 **See Epic Notes for Details   Patient Stated Goals improve vision   Currently in Pain? No/denies            [facility] OT Assessment - 08/04/15 0001    Coordination   Left 9 Hole Peg Test 87.65sec   Hand Function   Left Hand Grip (lbs) 26                  OT Treatments/Exercises (OP) - 08/04/15 0001    Visual/Perceptual Exercises   Copy this Image Pegboard   Pegboard with mod cueing for accuracy and significantly incr time, unable to put pegs in pegboard with L hand   Letter Search number cancellation sheet with only 1 error  OT Education - 08/04/15 1532    Education provided Yes   Education Details Recommended pt call Dr. Delice Lesch regarding hospitalization and L hand coordination change.  Visual Compensation Strategies and HEP.   Person(s) Educated Patient   Methods Explanation;Demonstration;Verbal cues;Handout   Comprehension Verbalized understanding;Returned demonstration          OT Short Term Goals - 08/04/15 1709    OT SHORT TERM GOAL #1   Title Pt will be independent with HEP for vision.--check STGs 08/19/15   Time 4   Period Weeks   Status On-going   OT SHORT TERM GOAL #2   Title Pt will verbalize understanding of visual compensation strategies.   Time 4   Period Weeks    Status On-going   OT SHORT TERM GOAL #3   Title Pt will perform a variety of complex tabletop scanning activities with at least 95% accuracy or greater.   Time 4   Period Weeks   Status On-going   OT SHORT TERM GOAL #4   Title Pt will perform simple environmental scanning/navigation with at least 60% accuracy without cues safely.   Time 4   Period Weeks   Status On-going   OT SHORT TERM GOAL #5   Title Pt will improve coordination for ADLs as shown by improving  time on 9-hole peg test with L hand by at least 15sec.   Time 4   Period Weeks   Status New           OT Long Term Goals - 08/04/15 1710    OT LONG TERM GOAL #1   Title Pt will verbalized understanding of memory compensations strategies for ADLs prn.--check LTGs 09/18/15   Time 8   Period Weeks   Status New   OT LONG TERM GOAL #2   Title Pt will perform simple environmental scanning/navigation with at least 90% accuracy.   Time 8   Period Weeks   Status New   OT LONG TERM GOAL #3   Title Pt will perform simple cooking task with good safety.   Time 8   Period Weeks   Status New   OT LONG TERM GOAL #4   Title Pt will improve coordination for ADLs as shown by improving  time on 9-hole peg test with L hand by at least 25sec.   Time 4   Period Weeks   Status New               Plan - 08/04/15 1658    Clinical Impression Statement Pt reports hospitalization last week due to low BP with insufficient adrenal functioning.  Pt reports change in L hand coordination as well since hospitalization and demo significant coordination change on 9-hole peg test (58.59sec difference from eval).  Will add goal for L hand coordination with new certification and note to MD>   Pt will benefit from skilled therapeutic intervention in order to improve on the following deficits (Retired) Decreased cognition;Impaired perceived functional ability;Impaired vision/preception;Decreased knowledge of precautions;Decreased safety  awareness;Decreased coordination;Decreased balance   Rehab Potential Good   Clinical Impairments Affecting Rehab Potential decr balance, cognitive deficits   OT Frequency 2x / week   OT Duration 8 weeks  +Eval   OT Treatment/Interventions Self-care/ADL training;Therapeutic exercise;Patient/family education;Neuromuscular education;Therapeutic activities;DME and/or AE instruction;Cognitive remediation/compensation;Visual/perceptual remediation/compensation   Plan visual scanning activities/visual compensation strategies   Recommended Other Services physical therapy due to multiple falls (in-basket message sent to MD 0/86/57), new cert. order to add L hand coordination sent  to MD 08/04/15   Consulted and Agree with Plan of Care Patient;Family member/caregiver   Family Member Consulted husband        Problem List Patient Active Problem List   Diagnosis Date Noted  . Left homonymous hemianopsia 06/09/2015  . Chronic daily headache 06/09/2015  . Abnormal brain MRI 04/06/2015  . Awareness alteration, transient 04/06/2015  . Other headache syndrome   . Altered mental status 03/21/2015  . Acute encephalopathy 03/21/2015  . GERD (gastroesophageal reflux disease) 03/21/2015  . Anxiety 03/21/2015  . Dementia 03/21/2015  . Polypharmacy 03/21/2015  . Vomiting 04/30/2014  . Nausea vomiting and diarrhea 04/30/2014  . Hyperglycemia 04/30/2014  . Metastatic melanoma to lung 03/25/2014  . Protein-calorie malnutrition, severe 03/19/2014  . Colitis 03/17/2014  . Candidiasis of mouth 12/16/2013  . Wound infection after surgery 12/05/2013  . Solitary brain metastasis from melanoma 12/03/2013  . S/P craniotomy 11/19/2013  . Lung nodule 11/16/2013  . Cerebellar mass 11/14/2013  . Hypothyroid 11/14/2013  . Tobacco abuse 11/14/2013    [facility] 08/04/2015, 5:14 PM  [facility] [address] [place], [place], [address] Phone:  [phone]   Fax:  [place], OTR/L 08/04/2015 5:14 PM

## 2015-08-04 NOTE — Patient Instructions (Signed)
Compensation strategies: 1. Look for the edge of objects (to the left and/or right) so that you make sure you are seeing all of an object 2. Turn your head when walking, scan from side to side, particularly in busy environments and have someone with you for safety. 3. Use an organized scanning pattern. It's usually easier to scan from top to bottom, and left to right (like you are reading) 4. Double check yourself 5. Use a line guide (like a blank piece of paper) or your finger when reading 6. If necessary, place brightly colored tape at end of table or work area as a reminder to always look until you see the tape.  7. Large print will be easier to see. 8. Organize items in baskets and/or with labels so they are easier to see/find 9. Remove clutter 10.  Make sure you have good lighting. 11.  Consider using contrast/brightly colored strips on steps to make it easier to see the edge.   Visual Activities: 1.  Simple word search 2.  Read simple paragraphs/bible verses out loud 3.  With someone with you, look for items in grocery store 4.  Work simple jigsaw puzzles 5.  Play simple matching games online/tablet that make you search visually.   Marland Kitchen

## 2015-08-05 ENCOUNTER — Telehealth: Payer: Self-pay | Admitting: *Deleted

## 2015-08-05 DIAGNOSIS — R296 Repeated falls: Secondary | ICD-10-CM

## 2015-08-05 NOTE — Telephone Encounter (Signed)
Please review

## 2015-08-05 NOTE — Telephone Encounter (Signed)
Patient called and would like to tell you a good story about her black spot on her head, and thank you for being concerned about her headache last week. Please call ASAP Call back number [phone]

## 2015-08-06 ENCOUNTER — Ambulatory Visit: Payer: 59

## 2015-08-06 NOTE — Telephone Encounter (Signed)
I spoke with her. She says her call was to just to say thank you for last week. She states she doesn't need to be seen right now. She states she is feeling "great". She did have a significant drop in her bp last week when she was having her head pain which she didn't know her bp was low until she went for her f/u appt the next day at [facility], but bp is now back to normal.

## 2015-08-06 NOTE — Telephone Encounter (Signed)
Pls have her come in to discuss changes, I have an opening tomorrow. Thanks

## 2015-08-06 NOTE — Telephone Encounter (Signed)
Lmovm to return my call. 

## 2015-08-07 ENCOUNTER — Other Ambulatory Visit: Payer: 59

## 2015-08-07 ENCOUNTER — Ambulatory Visit: Payer: 59 | Admitting: Occupational Therapy

## 2015-08-07 VITALS — BP 90/55

## 2015-08-07 DIAGNOSIS — H539 Unspecified visual disturbance: Secondary | ICD-10-CM

## 2015-08-07 DIAGNOSIS — R279 Unspecified lack of coordination: Secondary | ICD-10-CM

## 2015-08-07 DIAGNOSIS — H53462 Homonymous bilateral field defects, left side: Secondary | ICD-10-CM

## 2015-08-07 DIAGNOSIS — R4189 Other symptoms and signs involving cognitive functions and awareness: Secondary | ICD-10-CM

## 2015-08-07 NOTE — Telephone Encounter (Signed)
Noted. Pls let her know I have been receiving notes from her occupational therapist, and they recommend Physical therapy as well for frequent falls. Pls send referral for PT, thanks.

## 2015-08-07 NOTE — Therapy (Signed)
[facility] [address] [place], [place], [address] Phone: [phone]   Fax:  [phone]  Occupational Therapy Treatment  Patient Details  Name: [name] [name] MRN: [id number] Date of Birth: 1960-12-23 Referring Provider:  Orpah Melter, MD  Encounter Date: 08/07/2015      OT End of Session - 08/07/15 1325    Visit Number 3   Number of Visits 17   Date for OT Re-Evaluation 09/18/15   Authorization Type UHC 60 visit limit combined   OT Start Time 1318   OT Stop Time 1400   OT Time Calculation (min) 42 min   Activity Tolerance Patient tolerated treatment well      Past Medical History  Diagnosis Date  . TMJ (dislocation of temporomandibular joint)   . Thyroid disease   . Lung cancer     awaiting pathology  . Brain cancer     awaiting pathology; assuming this is a lung primary with brain mets  . Dementia   . Hypothyroidism   . GERD (gastroesophageal reflux disease)   . Anxiety     Past Surgical History  Procedure Laterality Date  . Tonsillectomy    . Appendectomy    . Suboccipital craniectomy cervical laminectomy N/A 11/19/2013    Procedure: SUBOCCIPITAL CRANIECTOMY for tumor;  Surgeon: Eustace Moore, MD;  Location: [facility];  Service: Neurosurgery;  Laterality: N/A;  . Lumbar wound debridement N/A 12/05/2013    Procedure: irrigation and debridement posterior cervical wound;  Surgeon: Eustace Moore, MD;  Location: [facility];  Service: Neurosurgery;  Laterality: N/A;  . Video assisted thoracoscopy (vats)/wedge resection Left 03/05/2014    Procedure: VIDEO ASSISTED THORACOSCOPY (VATS)/WEDGE RESECTION;  Surgeon: Melrose Nakayama, MD;  Location: [facility];  Service: Thoracic;  Laterality: Left;    Filed Vitals:   08/07/15 1317  BP: 90/55    Visit Diagnosis:  Visual disturbance  Left homonymous hemianopsia  Cognitive deficits  Lack of coordination      Subjective Assessment - 08/07/15  1317    Subjective  Pt reports that her blood pressure is better   Pertinent History hx of metastic melanoma with brain metastasis s/p craniotomy 10/2013, lung resection 2015, altered mental status 03/21/15 per Epic notes with vision deficits and MRI changes, hx of L foot fx 5/16 **See Epic Notes for Details   Patient Stated Goals improve vision   Currently in Pain? Yes   Pain Score 5    Pain Location Head   Pain Orientation Anterior   Pain Descriptors / Indicators Dull   Pain Frequency Constant   Aggravating Factors  noise   Pain Relieving Factors meds                      OT Treatments/Exercises (OP) - 08/07/15 0001    Fine Motor Coordination   Fine Motor Coordination --   Small Pegboard --  mod v.c. for design, to focus on task, internal distracted   Visual/Perceptual Exercises   Copy this Image Pegboard   Pegboard mod v.c. for correct design placing with left hand, internal distraction, with pt requiring mod v.c. to focus on task   Letter Search Pt completed 60M letter cancellation, with only 1 error initally, after v.c. pt completed task correctly   Functional Reaching Activities    Standing/ seated, functional reaching with LUE to place remove graded clothespins from vertical antennae with LUE min v.c. For sustained pinch/ increased LUE functional use.  OT Short Term Goals - 08/04/15 1709    OT SHORT TERM GOAL #1   Title Pt will be independent with HEP for vision.--check STGs 08/19/15   Time 4   Period Weeks   Status On-going   OT SHORT TERM GOAL #2   Title Pt will verbalize understanding of visual compensation strategies.   Time 4   Period Weeks   Status On-going   OT SHORT TERM GOAL #3   Title Pt will perform a variety of complex tabletop scanning activities with at least 95% accuracy or greater.   Time 4   Period Weeks   Status On-going   OT SHORT TERM GOAL #4   Title Pt will perform simple environmental scanning/navigation  with at least 60% accuracy without cues safely.   Time 4   Period Weeks   Status On-going   OT SHORT TERM GOAL #5   Title Pt will improve coordination for ADLs as shown by improving  time on 9-hole peg test with L hand by at least 15sec.   Time 4   Period Weeks   Status New           OT Long Term Goals - 08/04/15 1710    OT LONG TERM GOAL #1   Title Pt will verbalized understanding of memory compensations strategies for ADLs prn.--check LTGs 09/18/15   Time 8   Period Weeks   Status New   OT LONG TERM GOAL #2   Title Pt will perform simple environmental scanning/navigation with at least 90% accuracy.   Time 8   Period Weeks   Status New   OT LONG TERM GOAL #3   Title Pt will perform simple cooking task with good safety.   Time 8   Period Weeks   Status New   OT LONG TERM GOAL #4   Title Pt will improve coordination for ADLs as shown by improving  time on 9-hole peg test with L hand by at least 25sec.   Time 4   Period Weeks   Status New               Plan - 08/07/15 1326    Clinical Impression Statement Pt's BP is better today. She is progressing towards goals, yet demonstrates poor attention to task requiring frequent redirections. Pt is internally distracted.   Pt will benefit from skilled therapeutic intervention in order to improve on the following deficits (Retired) Decreased cognition;Impaired perceived functional ability;Impaired vision/preception;Decreased knowledge of precautions;Decreased safety awareness;Decreased coordination;Decreased balance   Rehab Potential Good   Clinical Impairments Affecting Rehab Potential decr balance, cognitive deficits   OT Frequency 2x / week   OT Duration 8 weeks   OT Treatment/Interventions Self-care/ADL training;Therapeutic exercise;Patient/family education;Neuromuscular education;Therapeutic activities;DME and/or AE instruction;Cognitive remediation/compensation;Visual/perceptual remediation/compensation   Plan  continue to address visual perceptual skills /visual compensation activities   Consulted and Agree with Plan of Care Patient        Problem List Patient Active Problem List   Diagnosis Date Noted  . Left homonymous hemianopsia 06/09/2015  . Chronic daily headache 06/09/2015  . Abnormal brain MRI 04/06/2015  . Awareness alteration, transient 04/06/2015  . Other headache syndrome   . Altered mental status 03/21/2015  . Acute encephalopathy 03/21/2015  . GERD (gastroesophageal reflux disease) 03/21/2015  . Anxiety 03/21/2015  . Dementia 03/21/2015  . Polypharmacy 03/21/2015  . Vomiting 04/30/2014  . Nausea vomiting and diarrhea 04/30/2014  . Hyperglycemia 04/30/2014  . Metastatic melanoma to lung 03/25/2014  .  Protein-calorie malnutrition, severe 03/19/2014  . Colitis 03/17/2014  . Candidiasis of mouth 12/16/2013  . Wound infection after surgery 12/05/2013  . Solitary brain metastasis from melanoma 12/03/2013  . S/P craniotomy 11/19/2013  . Lung nodule 11/16/2013  . Cerebellar mass 11/14/2013  . Hypothyroid 11/14/2013  . Tobacco abuse 11/14/2013    Jahrel Borthwick 08/07/2015, 4:11 PM Theone Murdoch, OTR/L [phone] Phone: [phone] 4:11 PM 08/07/2015 [facility] [address] [place], [place], [address] Phone: [phone]   Fax:  [phone]

## 2015-08-10 ENCOUNTER — Encounter: Payer: Self-pay | Admitting: Radiation Oncology

## 2015-08-10 ENCOUNTER — Ambulatory Visit: Payer: 59 | Admitting: Occupational Therapy

## 2015-08-10 ENCOUNTER — Ambulatory Visit
Admission: RE | Admit: 2015-08-10 | Discharge: 2015-08-10 | Disposition: A | Discharge status: Home / Self Care | Payer: 59 | Source: Ambulatory Visit | Attending: Radiation Oncology | Admitting: Radiation Oncology

## 2015-08-10 VITALS — BP 108/62 | HR 79 | Resp 16 | Wt 104.4 lb

## 2015-08-10 DIAGNOSIS — C7931 Secondary malignant neoplasm of brain: Secondary | ICD-10-CM

## 2015-08-10 DIAGNOSIS — E2740 Unspecified adrenocortical insufficiency: Secondary | ICD-10-CM | POA: Insufficient documentation

## 2015-08-10 DIAGNOSIS — C7949 Secondary malignant neoplasm of other parts of nervous system: Principal | ICD-10-CM

## 2015-08-10 NOTE — Progress Notes (Signed)
Radiation Oncology         [phone]    Name: [name] [name]   Date: 08/10/2015   MRN: [id number]  DOB: December 24, 1960    Multidisciplinary Brain and Spine Oncology Clinic Follow-Up Visit Note  CC: Orpah Melter, MD  Eustace Moore, MD  No diagnosis found.  Diagnosis:   54 year old woman status post resection of a 17 mm right cerebellar BRAF positive brain tumor followed by post-op conformal irradiation to 40 Gy in 10 fractions through 01/16/14  Interval Since Last Radiation:  1 year and 5  months   Narrative:  The patient returns today for routine follow-up. Vitals stable. Approximate 15 lb weight gain noted. Taking Cortef for adrenal insufficiency. Reports she lack peripheral vision now. Steady gait noted. Continues to reference a "black spot" [facility] found in her brain.  Reports ringing in her ears continue. Denies diplopia or floaters. Denies nausea or vomiting. Participates in OT twice per week. Waiting for PT scheduling to call with an appointment.  ALLERGIES:  is allergic to morphine and related and scopolamine.  Meds: Current Outpatient Prescriptions  Medication Sig Dispense Refill  . Biotin 1 MG CAPS Take 1 capsule by mouth daily.     . cephALEXin (KEFLEX) 500 MG capsule Take 1 capsule (500 mg total) by mouth 4 (four) times daily. 28 capsule 0  . citalopram (CELEXA) 20 MG tablet Take by mouth.    . docusate sodium (COLACE) 100 MG capsule Take 1 capsule (100 mg total) by mouth every 12 (twelve) hours. 14 capsule 1  . esomeprazole (NEXIUM) 20 MG capsule Take 20 mg by mouth daily at 12 noon.    . gabapentin (NEURONTIN) 600 MG tablet Take 600 mg by mouth at bedtime.    . hydrocortisone (CORTEF) 10 MG tablet Take by mouth.    . levothyroxine (SYNTHROID, LEVOTHROID) 112 MCG tablet Take 112 mcg by mouth daily before breakfast.    . LORazepam (ATIVAN) 1 MG tablet Take 1 tablet (1 mg total) by mouth every 12 (twelve) hours as needed for anxiety or sleep. 60 tablet 0  .  mirtazapine (REMERON) 15 MG tablet Take 15 mg by mouth at bedtime.     . ondansetron (ZOFRAN-ODT) 8 MG disintegrating tablet Take 8 mg by mouth every 8 (eight) hours as needed for nausea or vomiting.     Marland Kitchen oxyCODONE-acetaminophen (PERCOCET/ROXICET) 5-325 MG per tablet Take 1 tablet by mouth every 4 (four) hours as needed for severe pain.    . promethazine (PHENERGAN) 25 MG suppository Place 1 suppository (25 mg total) rectally every 6 (six) hours as needed for nausea or vomiting. 12 each 0  . promethazine (PHENERGAN) 25 MG tablet Take by mouth.    . traMADol (ULTRAM) 50 MG tablet Take 1 tablet (50 mg total) by mouth every 12 (twelve) hours as needed for moderate pain. 50 tablet 0  . triamcinolone ointment (KENALOG) 0.1 % Apply 1 application topically 2 (two) times daily.     . bisacodyl (STIMULANT LAXATIVE) 5 MG EC tablet Take by mouth.    . citalopram (CELEXA) 20 MG tablet Take 20 mg by mouth every morning.     . metoCLOPramide (REGLAN) 10 MG tablet Take by mouth.     No current facility-administered medications for this encounter.    Physical Findings: The patient is in no acute distress. Patient is alert and oriented.  weight is 104 lb 6.4 oz (47.356 kg). Her blood pressure is 108/62 and her pulse is 79. Her  respiration is 16. .  No significant changes.  Lab Findings: Lab Results  Component Value Date   WBC 6.3 07/10/2015   HGB 15.6* 07/10/2015   HCT 45.9 07/10/2015   MCV 88.3 07/10/2015   PLT 307 07/10/2015    Radiographic Findings: Her [facility] MRI was loaded into our PACS system to be presented by neuroradiology in our brain tumor board before our visit.  MRI brain with and without contrast    07/22/2015  [facility]  Result Narrative  ** MRI BRAIN WITHOUT AND WITH CONTRAST **  INDICATION: C79.9 Secondary malignant neoplasm of unspecified site, C79.31 Secondary malignant neoplasm of brain, melanoma, evaluate brain metastasis provided.  COMPARISON:  06/10/2015  TECHNIQUE/PROTOCOL: Standard adult brain protocol.   CONTRAST: 100m MultiHance IV. This MRI was performed before and after IV administration of contrast material. IV contrast was administered to improve disease detection and further define anatomy.  *    GFR:  >60 mL/min/1.73 square meters. *    Complications:  No immediate patient complications or events noted.  FINDINGS: Redemonstration of a right suboccipital craniectomy with a surgical cavity in the right cerebellar hemisphere. There is stable surrounding T2 hyperintensity. Again seen in the right thalamus is a lesion with intrinsic T1 hyperintensity measuring 10 x 9 mm, previously 12 x 11 mm (series 10, image 20). There may be minimal enhancement. Extensive T2 hyperintensity within the white matter of the right occipital, posterior parietal, and posterior temporal lobes similar to prior exam.   There is no acute cortical infarct or intracranial hemorrhage. The extra-axial spaces and basal cisterns normal. Intracranial flow-voids appear normal. Stable ex vacuo dilatation of the right lateral ventricle. The orbits, cranium, and mastoid sinuses are unremarkable. Extensive sinus opacification with opacification of the bilateral frontal, ethmoidal, sphenoid, and maxillary sinuses.  IMPRESSION:  1. Stable examination with evidence of disease progression. 2. Extensive sinus opacification, increased from prior, with air fluid levels.  Electronically Reviewed by:  WDuke Salvia MD Electronically Reviewed on:  07/22/2015 1:57 PM  I have reviewed the images and concur with the above findings.  Electronically Signed by:  DRegis Bill MD Electronically Signed on:  07/22/2015 5:30 PM       Impression:  No evidence of recurrence at this time.  She is now under treatment for adrenal insufficiency which may have been the cause of hypotensive episodes resulting in Posterior Reversible Encephalopathy Syndrome (PRES).  She  is now being treated for that.  Plan:  Repeat MRI in 3 months and follow up. Continue to follow up with oncology AT [facility]   This document serves as a record of services personally performed by MTyler Pita MD. It was created on his behalf by JDarcus Austin a trained medical scribe. The creation of this record is based on the scribe's personal observations and the provider's statements to them. This document has been checked and approved by the attending provider.  _____________________________________  MSheral Apley MTammi Klippel M.D.

## 2015-08-10 NOTE — Telephone Encounter (Signed)
Patient notified she is ok with this referral

## 2015-08-10 NOTE — Telephone Encounter (Signed)
Please call. Thank you.

## 2015-08-10 NOTE — Progress Notes (Addendum)
Vitals stable.  Approximate 15 lb weight gain noted. Taking Cortef for adrenal insufficiency. Reports she lack peripheral vision now. Steady gait noted. Continues to reference a "black spot" [facility] found in her brain. Patient unable to concentrate and continues babble. Reports a constant nagging headache. Reports ringing in her ears continue. Denies diplopia or floaters. Denies nausea or vomiting. Participates in OT twice per week. Waiting for PT scheduling to call with an appointment.  BP 108/62 mmHg  Pulse 79  Resp 16  Wt 104 lb 6.4 oz (47.356 kg) Wt Readings from Last 3 Encounters:  08/10/15 104 lb 6.4 oz (47.356 kg)  07/10/15 89 lb (40.37 kg)  06/08/15 104 lb (47.174 kg)

## 2015-08-11 ENCOUNTER — Ambulatory Visit: Payer: 59 | Admitting: Occupational Therapy

## 2015-08-11 DIAGNOSIS — H539 Unspecified visual disturbance: Secondary | ICD-10-CM | POA: Diagnosis not present

## 2015-08-11 DIAGNOSIS — R279 Unspecified lack of coordination: Secondary | ICD-10-CM

## 2015-08-11 DIAGNOSIS — H53462 Homonymous bilateral field defects, left side: Secondary | ICD-10-CM

## 2015-08-11 DIAGNOSIS — R4189 Other symptoms and signs involving cognitive functions and awareness: Secondary | ICD-10-CM

## 2015-08-11 NOTE — Addendum Note (Signed)
Addended by: Thurmon Fair on: 08/11/2015 07:37 AM   Modules accepted: Orders

## 2015-08-11 NOTE — Therapy (Signed)
[facility] [address] [place], [place], [address] Phone: [phone]   Fax:  [phone]  Occupational Therapy Treatment  Patient Details  Name: [name] [name] MRN: [id number] Date of Birth: 02-09-1961 Referring Provider:  Orpah Melter, MD  Encounter Date: 08/11/2015      OT End of Session - 08/11/15 1339    Visit Number 4   Number of Visits 17   Date for OT Re-Evaluation 09/18/15   Authorization Type UHC 60 visit limit combined   OT Start Time 1318   OT Stop Time 1400   OT Time Calculation (min) 42 min   Activity Tolerance Patient tolerated treatment well   Behavior During Therapy Restless;Impulsive      Past Medical History  Diagnosis Date  . TMJ (dislocation of temporomandibular joint)   . Thyroid disease   . Lung cancer     awaiting pathology  . Brain cancer     awaiting pathology; assuming this is a lung primary with brain mets  . Dementia   . Hypothyroidism   . GERD (gastroesophageal reflux disease)   . Anxiety     Past Surgical History  Procedure Laterality Date  . Tonsillectomy    . Appendectomy    . Suboccipital craniectomy cervical laminectomy N/A 11/19/2013    Procedure: SUBOCCIPITAL CRANIECTOMY for tumor;  Surgeon: Eustace Moore, MD;  Location: [facility];  Service: Neurosurgery;  Laterality: N/A;  . Lumbar wound debridement N/A 12/05/2013    Procedure: irrigation and debridement posterior cervical wound;  Surgeon: Eustace Moore, MD;  Location: [facility];  Service: Neurosurgery;  Laterality: N/A;  . Video assisted thoracoscopy (vats)/wedge resection Left 03/05/2014    Procedure: VIDEO ASSISTED THORACOSCOPY (VATS)/WEDGE RESECTION;  Surgeon: Melrose Nakayama, MD;  Location: [facility];  Service: Thoracic;  Laterality: Left;    There were no vitals filed for this visit.  Visit Diagnosis:  Visual disturbance  Left homonymous hemianopsia  Cognitive deficits  Lack of  coordination      Subjective Assessment - 08/11/15 1332    Subjective  Pt reports that she has received call from Dr. Amparo Bristol and that they placed referral for PT and that pt told her them about recent hospitalization/change.  Pt reports that she has seen cancer MD at [facility] last week and that she said that they will just watch new MRI spot for now.  Next MRI  schedudled 09/02/15.   Pertinent History hx of metastic melanoma with brain metastasis s/p craniotomy 10/2013, lung resection 2015, altered mental status 03/21/15 per Epic notes with vision deficits and MRI changes, hx of L foot fx 5/16 **See Epic Notes for Details   Patient Stated Goals improve vision   Currently in Pain? No/denies                      OT Treatments/Exercises (OP) - 08/11/15 0001    Fine Motor Coordination   Other Fine Motor Exercises Picking up coins with L hand and placing in container for incr visual scanning/attention to the L and L hand coordination with only occasional min difficulty.   Visual/Perceptual Exercises   Other Exercises Activities for incr attention to task, attention to L side, and visual scanning:  Visual scanning to complete 24-piece puzzle with min cueing for problem-solving, strategy, and attention and min cueing for scanning to L side.  Visual scanning on table top to match clock "faces" to digital times with mod cues initially to  use visual compensation strategies (turn head, look for L edge of table/objects) as pt initially did not see 3 rows of objects.  Pt needed incr time.       Re-tested 9-hole peg test (see goal section) due to observed improvement in L hand coordination.           OT Short Term Goals - 08/11/15 1657    OT SHORT TERM GOAL #1   Title Pt will be independent with HEP for vision.--check STGs 08/19/15   Time 4   Period Weeks   Status On-going   OT SHORT TERM GOAL #2   Title Pt will verbalize understanding of visual compensation strategies.   Time 4    Period Weeks   Status On-going   OT SHORT TERM GOAL #3   Title Pt will perform a variety of complex tabletop scanning activities with at least 95% accuracy or greater.   Time 4   Period Weeks   Status On-going   OT SHORT TERM GOAL #4   Title Pt will perform simple environmental scanning/navigation with at least 60% accuracy without cues safely.   Time 4   Period Weeks   Status On-going   OT SHORT TERM GOAL #5   Title Pt will improve coordination for ADLs as shown by improving  time on 9-hole peg test with L hand by at least 15sec.   Time 4   Period Weeks   Status Achieved  08/11/15:  31.25sec (approximating baseline)           OT Long Term Goals - 08/11/15 1659    OT LONG TERM GOAL #1   Title Pt will verbalized understanding of memory compensations strategies for ADLs prn.--check LTGs 09/18/15   Time 8   Period Weeks   Status New   OT LONG TERM GOAL #2   Title Pt will perform simple environmental scanning/navigation with at least 90% accuracy.   Time 8   Period Weeks   Status New   OT LONG TERM GOAL #3   Title Pt will perform simple cooking task with good safety.   Time 8   Period Weeks   Status New   OT LONG TERM GOAL #4   Title Pt will improve coordination for ADLs as shown by improving  time on 9-hole peg test with L hand by at least 25sec.   Time 4   Period Weeks   Status Achieved  08/11/15:  31.25sec (approximating baseline)               Plan - 08/11/15 1653    Clinical Impression Statement Pt progressing towards goals, but decr attention affects compensation strategy use.  Received PT referral and discussed scheduling.  Pt demo significant improvement in L hand coordination from last week and is now approximating baseline.  Pt verbalized agreement and scheduled the eval.   Plan continue to address visual perceptual skills/visual compensation strategies   OT Home Exercise Plan Pt issued the following education:  08/04/15 visual compensation  strategies/activities    Consulted and Agree with Plan of Care Patient        Problem List Patient Active Problem List   Diagnosis Date Noted  . Left homonymous hemianopsia 06/09/2015  . Chronic daily headache 06/09/2015  . Abnormal brain MRI 04/06/2015  . Awareness alteration, transient 04/06/2015  . Other headache syndrome   . Altered mental status 03/21/2015  . Acute encephalopathy 03/21/2015  . GERD (gastroesophageal reflux disease) 03/21/2015  . Anxiety 03/21/2015  .  Dementia 03/21/2015  . Polypharmacy 03/21/2015  . Vomiting 04/30/2014  . Nausea vomiting and diarrhea 04/30/2014  . Hyperglycemia 04/30/2014  . Metastatic melanoma to lung 03/25/2014  . Protein-calorie malnutrition, severe 03/19/2014  . Colitis 03/17/2014  . Candidiasis of mouth 12/16/2013  . Wound infection after surgery 12/05/2013  . Solitary brain metastasis from melanoma 12/03/2013  . S/P craniotomy 11/19/2013  . Lung nodule 11/16/2013  . Cerebellar mass 11/14/2013  . Hypothyroid 11/14/2013  . Tobacco abuse 11/14/2013    [facility] 08/11/2015, 5:00 PM  [facility] [address] [place], [place], [address] Phone: [phone]   Fax:  [place], OTR/L 08/11/2015 5:00 PM

## 2015-08-11 NOTE — Telephone Encounter (Signed)
Referral for PT sent.

## 2015-08-12 ENCOUNTER — Ambulatory Visit: Payer: 59 | Admitting: Occupational Therapy

## 2015-08-13 ENCOUNTER — Ambulatory Visit: Payer: 59 | Admitting: Physical Therapy

## 2015-08-13 ENCOUNTER — Ambulatory Visit: Payer: 59 | Admitting: Occupational Therapy

## 2015-08-13 ENCOUNTER — Encounter: Payer: Self-pay | Admitting: Physical Therapy

## 2015-08-13 ENCOUNTER — Encounter: Payer: Self-pay | Admitting: Occupational Therapy

## 2015-08-13 DIAGNOSIS — R269 Unspecified abnormalities of gait and mobility: Secondary | ICD-10-CM

## 2015-08-13 DIAGNOSIS — R531 Weakness: Secondary | ICD-10-CM

## 2015-08-13 DIAGNOSIS — R279 Unspecified lack of coordination: Secondary | ICD-10-CM

## 2015-08-13 DIAGNOSIS — H539 Unspecified visual disturbance: Secondary | ICD-10-CM | POA: Diagnosis not present

## 2015-08-13 DIAGNOSIS — R2681 Unsteadiness on feet: Secondary | ICD-10-CM

## 2015-08-13 DIAGNOSIS — R4189 Other symptoms and signs involving cognitive functions and awareness: Secondary | ICD-10-CM

## 2015-08-13 DIAGNOSIS — H53462 Homonymous bilateral field defects, left side: Secondary | ICD-10-CM

## 2015-08-13 NOTE — Therapy (Signed)
[facility] [address] [place], [place], [address] Phone: [phone]   Fax:  [phone]  Occupational Therapy Treatment  Patient Details  Name: [name] [name] MRN: [id number] Date of Birth: 01-Oct-1961 Referring Provider:  Orpah Melter, MD  Encounter Date: 08/13/2015      OT End of Session - 08/13/15 1108    Visit Number 5   Number of Visits 17   Date for OT Re-Evaluation 09/18/15   Authorization Type UHC 60 visit limit combined   OT Start Time 0931   OT Stop Time 1014   OT Time Calculation (min) 43 min   Activity Tolerance Patient tolerated treatment well      Past Medical History  Diagnosis Date  . TMJ (dislocation of temporomandibular joint)   . Thyroid disease   . Lung cancer     awaiting pathology  . Brain cancer     awaiting pathology; assuming this is a lung primary with brain mets  . Dementia   . Hypothyroidism   . GERD (gastroesophageal reflux disease)   . Anxiety     Past Surgical History  Procedure Laterality Date  . Tonsillectomy    . Appendectomy    . Suboccipital craniectomy cervical laminectomy N/A 11/19/2013    Procedure: SUBOCCIPITAL CRANIECTOMY for tumor;  Surgeon: Eustace Moore, MD;  Location: [facility];  Service: Neurosurgery;  Laterality: N/A;  . Lumbar wound debridement N/A 12/05/2013    Procedure: irrigation and debridement posterior cervical wound;  Surgeon: Eustace Moore, MD;  Location: [facility];  Service: Neurosurgery;  Laterality: N/A;  . Video assisted thoracoscopy (vats)/wedge resection Left 03/05/2014    Procedure: VIDEO ASSISTED THORACOSCOPY (VATS)/WEDGE RESECTION;  Surgeon: Melrose Nakayama, MD;  Location: [facility];  Service: Thoracic;  Laterality: Left;    There were no vitals filed for this visit.  Visit Diagnosis:  Visual disturbance  Left homonymous hemianopsia  Cognitive deficits  Lack of coordination      Subjective Assessment - 08/13/15 0938     Subjective  I feel so much better now that I am on steroids   Pertinent History hx of metastic melanoma with brain metastasis s/p craniotomy 10/2013, lung resection 2015, altered mental status 03/21/15 per Epic notes with vision deficits and MRI changes, hx of L foot fx 5/16 **See Epic Notes for Details   Patient Stated Goals improve vision   Currently in Pain? Yes   Pain Score 3    Pain Location Head   Pain Orientation Posterior   Pain Descriptors / Indicators Dull   Pain Type Chronic pain   Pain Onset More than a month ago   Pain Frequency Constant   Aggravating Factors  noise   Pain Relieving Factors meds                      OT Treatments/Exercises (OP) - 08/13/15 0001    Visual/Perceptual Exercises   Other Exercises Therapeutic activities to address table top L visual scanning, attention to L visual space with emphasis on pt being able to verbalize at least 2 strategies (pt able to do that without assist)  with progressively increasing difficulty - pt required min questioning cues only to identify mistakes and was able to then self correct. Pt demonstrated somewhat of an organized strategy during task. Pt with significant sustained attentional deficits as well as simple divided attention and therefore benefits from quiet environment, increased time and redirection. Pt with 80% accuracy  OT Short Term Goals - 08/13/15 1022    OT SHORT TERM GOAL #1   Title Pt will be independent with HEP for vision.--check STGs 08/19/15   Time 4   Period Weeks   Status On-going   OT SHORT TERM GOAL #2   Title Pt will verbalize understanding of visual compensation strategies.   Time 4   Period Weeks   Status On-going  9/15 able to identify two strategies   OT SHORT TERM GOAL #3   Title Pt will perform a variety of complex tabletop scanning activities with at least 95% accuracy or greater.   Time 4   Period Weeks   Status On-going   OT SHORT TERM GOAL  #4   Title Pt will perform simple environmental scanning/navigation with at least 60% accuracy without cues safely.   Time 4   Period Weeks   Status On-going   OT SHORT TERM GOAL #5   Title Pt will improve coordination for ADLs as shown by improving  time on 9-hole peg test with L hand by at least 15sec.   Time 4   Period Weeks   Status Achieved  08/11/15:  31.25sec (approximating baseline)           OT Long Term Goals - 08/13/15 1023    OT LONG TERM GOAL #1   Title Pt will verbalized understanding of memory compensations strategies for ADLs prn.--check LTGs 09/18/15   Time 8   Period Weeks   Status New   OT LONG TERM GOAL #2   Title Pt will perform simple environmental scanning/navigation with at least 90% accuracy.   Time 8   Period Weeks   Status New   OT LONG TERM GOAL #3   Title Pt will perform simple cooking task with good safety.   Time 8   Period Weeks   Status New   OT LONG TERM GOAL #4   Title Pt will improve coordination for ADLs as shown by improving  time on 9-hole peg test with L hand by at least 25sec.   Time 4   Period Weeks   Status Achieved  08/11/15:  31.25sec (approximating baseline)               Plan - 08/13/15 1024    Clinical Impression Statement Pt recently expeprienced episode of acute adrenal insuffiicency and was admitted to [facility] ICU. This led to hypotensive episode with resultant PRES. Pt is now on steroids to address this. PRES clearly has had significant impact on pt's cognition as well.  Pt benefits from quiet environment, re direction and brief periods of "break" from activity . Pt with hypervervbosity and break allows pt to verbalize and then return to activity with redirection with imrpoved attention. Pt making gains in identifying strategies for L hemianopsia  - needs min vc's to implement   Pt will benefit from skilled therapeutic intervention in order to improve on the following deficits (Retired) Decreased cognition;Impaired  perceived functional ability;Impaired vision/preception;Decreased knowledge of precautions;Decreased safety awareness;Decreased coordination;Decreased balance   Rehab Potential Good   Clinical Impairments Affecting Rehab Potential decr balance, cognitive deficits   OT Frequency 2x / week   OT Duration 8 weeks   OT Treatment/Interventions Self-care/ADL training;Therapeutic exercise;Patient/family education;Neuromuscular education;Therapeutic activities;DME and/or AE instruction;Cognitive remediation/compensation;Visual/perceptual remediation/compensation   Plan consider addressing cognition in light of recent PRES diagnosis, visual perception, coordination LUE   OT Home Exercise Plan Pt issued the following education:  08/04/15 visual compensation strategies/activities    Consulted  and Agree with Plan of Care Patient   Family Member Consulted husband        Problem List Patient Active Problem List   Diagnosis Date Noted  . Left homonymous hemianopsia 06/09/2015  . Chronic daily headache 06/09/2015  . Abnormal brain MRI 04/06/2015  . Awareness alteration, transient 04/06/2015  . Other headache syndrome   . Altered mental status 03/21/2015  . Acute encephalopathy 03/21/2015  . GERD (gastroesophageal reflux disease) 03/21/2015  . Anxiety 03/21/2015  . Dementia 03/21/2015  . Polypharmacy 03/21/2015  . Vomiting 04/30/2014  . Nausea vomiting and diarrhea 04/30/2014  . Hyperglycemia 04/30/2014  . Metastatic melanoma to lung 03/25/2014  . Protein-calorie malnutrition, severe 03/19/2014  . Colitis 03/17/2014  . Candidiasis of mouth 12/16/2013  . Wound infection after surgery 12/05/2013  . Solitary brain metastasis from melanoma 12/03/2013  . S/P craniotomy 11/19/2013  . Lung nodule 11/16/2013  . Cerebellar mass 11/14/2013  . Hypothyroid 11/14/2013  . Tobacco abuse 11/14/2013   Pt with recent diagnosis of PRES due to severe adrenal insufficiency that led to hypotensive episode.    Quay Burow, OTR/L 08/13/2015, 11:19 AM  [facility] [address] [place], [place], [address] Phone: [phone]   Fax:  [phone]

## 2015-08-14 NOTE — Therapy (Signed)
[facility] [address] [place], [place], [address] Phone: [phone]   Fax:  [phone]  Physical Therapy Evaluation  Patient Details  Name: [name] [name] MRN: [id number] Date of Birth: May 27, 1961 Referring Provider:  Orpah Melter, MD  Encounter Date: 08/13/2015      PT End of Session - 08/13/15 1130    Visit Number 1   Number of Visits 9  eval +8 visits   Date for PT Re-Evaluation 09/12/15   Authorization Type UHC   Equipment Utilized During Treatment Gait belt   Activity Tolerance Patient tolerated treatment well   Behavior During Therapy Restless      Past Medical History  Diagnosis Date  . TMJ (dislocation of temporomandibular joint)   . Thyroid disease   . Lung cancer     awaiting pathology  . Brain cancer     awaiting pathology; assuming this is a lung primary with brain mets  . Dementia   . Hypothyroidism   . GERD (gastroesophageal reflux disease)   . Anxiety     Past Surgical History  Procedure Laterality Date  . Tonsillectomy    . Appendectomy    . Suboccipital craniectomy cervical laminectomy N/A 11/19/2013    Procedure: SUBOCCIPITAL CRANIECTOMY for tumor;  Surgeon: Eustace Moore, MD;  Location: [facility];  Service: Neurosurgery;  Laterality: N/A;  . Lumbar wound debridement N/A 12/05/2013    Procedure: irrigation and debridement posterior cervical wound;  Surgeon: Eustace Moore, MD;  Location: [facility];  Service: Neurosurgery;  Laterality: N/A;  . Video assisted thoracoscopy (vats)/wedge resection Left 03/05/2014    Procedure: VIDEO ASSISTED THORACOSCOPY (VATS)/WEDGE RESECTION;  Surgeon: Melrose Nakayama, MD;  Location: [facility];  Service: Thoracic;  Laterality: Left;    There were no vitals filed for this visit.  Visit Diagnosis:  Abnormality of gait - Plan: PT plan of care cert/re-cert  Unsteadiness - Plan: PT plan of care cert/re-cert  Weakness generalized - Plan: PT  plan of care cert/re-cert      Subjective Assessment - 08/13/15 1024    Subjective Pt reports having sustained 4 falls in 4 weeks. Does recall that one fall occurred secondary to lightheadedness when bending over to deadhead her flowers outside.    Pertinent History PMH: metastic melanoma with brain metastasis s/p craniotomy 10/2013, lung resection 2015, altered mental status 03/21/15 per Epic notes with vision deficits and MRI changes, hx of L foot fx 5/16   Patient Stated Goals "Get my legs stronger"   Currently in Pain? Yes   Pain Score 3    Pain Location Head   Pain Orientation Posterior   Pain Descriptors / Indicators Dull   Pain Type Chronic pain   Pain Frequency Constant   Aggravating Factors  noise   Pain Relieving Factors meds                               PT Education - 08/13/15 1126    Education provided Yes   Education Details PT eval goals, findings, and POC   Person(s) Educated Patient   Methods Explanation   Comprehension Verbalized understanding          PT Short Term Goals - 08/13/15 1144    PT SHORT TERM GOAL #1   Title Pt will perform initial HEP with mod I using handout to maximize functional gains made in physical therapy. Target date: 09/10/15.  PT SHORT TERM GOAL #2   Title Pt will increase FGA score from 21 to 26/30 to indicate decreased fall risk, improved functional gait stability.  Target date: 09/10/15.   PT SHORT TERM GOAL #3   Title Pt will increase self-selected gait speed from 1.77 ft/sec to 2.37 ft/sec to indicate decreased risk of falling.  Target date: 09/10/15.   PT SHORT TERM GOAL #4   Title Pt will ambulate x500' over unlevel paved and grass surfaces with mod I using LRAD to indicate safety with community mobility.  Target date: 09/10/15.   PT SHORT TERM GOAL #5   Title Pt will verbalize understanding of fall prevention strategies without cueing to decrease risk of falling in home environment.  Target date:  09/10/15.           PT Long Term Goals - 08/13/15 1144    PT LONG TERM GOAL #1   Title STG's = LTG's               Plan - 08/13/15 1138    Clinical Impression Statement Pt is a 54 y/o F with h/o metastic melanoma with brain metastasis (s/p craniotomy 10/2013) presenting to outpatient PT to address recent onset of frequent falls. PT evaluation reveals the following:  self-selected gait speed indicative of recurrent fall risk; FGA score suggestive of fall risk; proximal LE weakness (on L > RLE); gait instability with dual tasking, functional head turning, and when ambulating in crowded environments. Pt will benefit from skilled outpaitient PT 2x/week for 4 weeks to address said impairments.   Pt will benefit from skilled therapeutic intervention in order to improve on the following deficits Decreased balance;Abnormal gait;Decreased cognition;Decreased coordination;Decreased activity tolerance;Decreased strength;Increased edema;Decreased safety awareness;Pain;Other (comment)  Pain will not be monitored but not directly addressed in PT, as headache is exacerbated by loud noises and relieved only by medication   Rehab Potential Good   Clinical Impairments Affecting Rehab Potential cognitive impairments    PT Frequency 2x / week   PT Duration 4 weeks   PT Treatment/Interventions ADLs/Self Care Home Management;Gait training;Stair training;Functional mobility training;Therapeutic activities;Therapeutic exercise;Patient/family education;Neuromuscular re-education;Vestibular;Balance training;Manual techniques   PT Next Visit Plan Assess gait outdoors. Provide handout for fall prevention strategies. Initiate HEP.   PT Home Exercise Plan Hip ABD strenthening (L > R)   Consulted and Agree with Plan of Care Patient         Problem List Patient Active Problem List   Diagnosis Date Noted  . Left homonymous hemianopsia 06/09/2015  . Chronic daily headache 06/09/2015  . Abnormal brain MRI  04/06/2015  . Awareness alteration, transient 04/06/2015  . Other headache syndrome   . Altered mental status 03/21/2015  . Acute encephalopathy 03/21/2015  . GERD (gastroesophageal reflux disease) 03/21/2015  . Anxiety 03/21/2015  . Dementia 03/21/2015  . Polypharmacy 03/21/2015  . Vomiting 04/30/2014  . Nausea vomiting and diarrhea 04/30/2014  . Hyperglycemia 04/30/2014  . Metastatic melanoma to lung 03/25/2014  . Protein-calorie malnutrition, severe 03/19/2014  . Colitis 03/17/2014  . Candidiasis of mouth 12/16/2013  . Wound infection after surgery 12/05/2013  . Solitary brain metastasis from melanoma 12/03/2013  . S/P craniotomy 11/19/2013  . Lung nodule 11/16/2013  . Cerebellar mass 11/14/2013  . Hypothyroid 11/14/2013  . Tobacco abuse 11/14/2013    Billie Ruddy, PT, DPT [facility] [address] [place], [place], [address] Phone: [phone]   Fax:  [phone] 08/14/2015, 2:54 PM

## 2015-10-08 ENCOUNTER — Ambulatory Visit (INDEPENDENT_AMBULATORY_CARE_PROVIDER_SITE_OTHER): Payer: 59 | Admitting: Neurology

## 2015-10-08 ENCOUNTER — Encounter: Payer: Self-pay | Admitting: Neurology

## 2015-10-08 VITALS — BP 100/70 | HR 70 | Wt 103.4 lb

## 2015-10-08 DIAGNOSIS — C7931 Secondary malignant neoplasm of brain: Secondary | ICD-10-CM | POA: Diagnosis not present

## 2015-10-08 DIAGNOSIS — R930 Abnormal findings on diagnostic imaging of skull and head, not elsewhere classified: Secondary | ICD-10-CM | POA: Diagnosis not present

## 2015-10-08 DIAGNOSIS — R9089 Other abnormal findings on diagnostic imaging of central nervous system: Secondary | ICD-10-CM

## 2015-10-08 NOTE — Progress Notes (Signed)
NEUROLOGY FOLLOW UP OFFICE NOTE  DELAYNI STREED [id number]  HISTORY OF PRESENT ILLNESS: I had the pleasure of seeing Jerusha Reising in follow-up in the neurology clinic on 11/10//2016.  The patient was last seen 4 months ago for post-hospital care after an episode of altered mental status with abnormal brain MRI, considerations included post-ictal changes, PRES, limbic encephalitis. CSF studies normal. She is again accompanied by her husband who helps supplement the history today.  Records and images were personally reviewed where available. Since her last visit, she has been back to [facility] where a repeat MRI brain had shown a new lesion in the right posterior thalamus potentially concerning for malignancy. Since there were other atypical findings on MRI (new geographic T2 signal hyperintensity in the white matter of the right posterior temporal lobe and right periatrial region), it was unclear if this truly represents a metastatic lesion. She continues with serial imaging at [facility]. PET scan in October did not show evidence of metastatic disease. CT C/A/P negative for extracranial disease. She was admitted at [facility] in August for hypotension, weakness, and fatigue, and was found to have adrenal insufficiency. She is now on replacement steroids.  She had been seen at [facility] with a diagnosis of left homonymous hemianopsia secondary to right posterior temporal and occipital lesions.   She reports that she is doing very well. She is scheduled to have a repeat MRI brain in 2 months. She has completed PT and OT, and denies any further falls recently. She is happy to report that she visited her mother and sister in [place] with no difficulties. She continues to have headaches daily but reports these are mild and she has not needed any rescue medication.   She denies any olfactory/gustatory hallucinations, deja vu, rising epigastric sensation, myoclonic jerks. She had a normal birth and early  development. There is no history of febrile convulsions, significant traumatic brain injury, or family history of seizures. She is concerned about poor weight gain and poor appetite.  HPI: This is a 54 yo RH woman with a history of metastatic melanoma with brain metastasis s/p craniotomy, with negative MRI brain scans at [place] (most recently 02/25/15), in her usual state of health until 03/21/15 when she presented to [facility] ER with altered mental status. She and her husband went camping from 4/13 to 4/17. She recalls having a bad headache and was up all night. She did not recall much of the drive back, but apparently her husband had difficulty arousing her, then she was just looking at him, babbling, diaphoretic. She was brought to [facility] ER where she was evaluated by Neurology, per notes she would have intermittent periods where she would become lucid, then become non-verbal with eyes staring off to either side. There is note that she had diarrhea and nausea the day prior. On exam, she was oriented x 1, intermittently able to provide a history then becomes less responsive, intermittently following commands. She had left-sided neglect. She had an MRI brain with and without contrast which I personally reviewed, there was prior right suboccipital craniotomy with encephalomalacia in the right cerebellar hemisphere. There was abnormal T2/FLAIR signal with edema involving predominantly the cortical gray matter of the bilateral temporal occipital regions, right greater than left, with involvement of the mesial right temporal lobe, as well as probable increased signal in the left mesial temporal lobe. Considerations included post-ictal changes, PRES, limbic encephalitis. Her routine wake and sleep EEG was abnormal due to mild diffuse slowing. She  had a lumbar puncture with CSF WBC 0, RBC 2, protein 68, glucose 98. HSV, Lyme, West Nile, Enterovirus, gram stain and culture were negative. Serum Lyme and RMSF were negative. Her B12,  Hepatitis panel, HIV Ab were negative. TSH elevated at 7.001. Ammonia level initially 77, down to 32 the next day. TPO Ab, NMDA Ab negative.   PAST MEDICAL HISTORY: Past Medical History  Diagnosis Date  . TMJ (dislocation of temporomandibular joint)   . Thyroid disease   . Lung cancer [facility])     awaiting pathology  . Brain cancer [facility])     awaiting pathology; assuming this is a lung primary with brain mets  . Dementia   . Hypothyroidism   . GERD (gastroesophageal reflux disease)   . Anxiety     MEDICATIONS: Current Outpatient Prescriptions on File Prior to Visit  Medication Sig Dispense Refill  . Biotin 1 MG CAPS Take 1 capsule by mouth daily.     . bisacodyl (STIMULANT LAXATIVE) 5 MG EC tablet Take by mouth.    . cephALEXin (KEFLEX) 500 MG capsule Take 1 capsule (500 mg total) by mouth 4 (four) times daily. 28 capsule 0  . citalopram (CELEXA) 20 MG tablet Take 20 mg by mouth every morning.     . citalopram (CELEXA) 20 MG tablet Take by mouth.    . docusate sodium (COLACE) 100 MG capsule Take 1 capsule (100 mg total) by mouth every 12 (twelve) hours. 14 capsule 1  . esomeprazole (NEXIUM) 20 MG capsule Take 20 mg by mouth daily at 12 noon.    . gabapentin (NEURONTIN) 600 MG tablet Take 600 mg by mouth at bedtime.    Marland Kitchen levothyroxine (SYNTHROID, LEVOTHROID) 112 MCG tablet Take 112 mcg by mouth daily before breakfast.    . LORazepam (ATIVAN) 1 MG tablet Take 1 tablet (1 mg total) by mouth every 12 (twelve) hours as needed for anxiety or sleep. 60 tablet 0  . metoCLOPramide (REGLAN) 10 MG tablet Take by mouth.    . mirtazapine (REMERON) 15 MG tablet Take 15 mg by mouth at bedtime.     . ondansetron (ZOFRAN-ODT) 8 MG disintegrating tablet Take 8 mg by mouth every 8 (eight) hours as needed for nausea or vomiting.     Marland Kitchen oxyCODONE-acetaminophen (PERCOCET/ROXICET) 5-325 MG per tablet Take 1 tablet by mouth every 4 (four) hours as needed for severe pain.    . promethazine (PHENERGAN) 25 MG  suppository Place 1 suppository (25 mg total) rectally every 6 (six) hours as needed for nausea or vomiting. 12 each 0  . promethazine (PHENERGAN) 25 MG tablet Take by mouth.    . traMADol (ULTRAM) 50 MG tablet Take 1 tablet (50 mg total) by mouth every 12 (twelve) hours as needed for moderate pain. 50 tablet 0  . triamcinolone ointment (KENALOG) 0.1 % Apply 1 application topically 2 (two) times daily.      No current facility-administered medications on file prior to visit.    ALLERGIES: Allergies  Allergen Reactions  . Morphine And Related Nausea Only  . Scopolamine Rash    FAMILY HISTORY: Family History  Problem Relation Age of Onset  . Dementia Mother   . Cancer - Other Other     SOCIAL HISTORY: Social History   Social History  . Marital Status: Married    Spouse Name: N/A  . Number of Children: N/A  . Years of Education: N/A   Occupational History  . Not on file.   Social History Main  Topics  . Smoking status: Former Smoker    Quit date: 11/19/2013  . Smokeless tobacco: Never Used  . Alcohol Use: No  . Drug Use: No  . Sexual Activity: Not on file   Other Topics Concern  . Not on file   Social History Narrative    REVIEW OF SYSTEMS: Constitutional: No fevers, chills, or sweats, generalized fatigue, change in appetite Eyes: No visual changes, double vision, eye pain Ear, nose and throat: No hearing loss, ear pain, nasal congestion, sore throat Cardiovascular: No chest pain, palpitations Respiratory:  No shortness of breath at rest or with exertion, wheezes GastrointestinaI: No nausea, vomiting, diarrhea, abdominal pain, fecal incontinence Genitourinary:  No dysuria, urinary retention or frequency Musculoskeletal:  No neck pain, back pain Integumentary: No rash, pruritus, skin lesions Neurological: as above Psychiatric: No depression, insomnia, anxiety Endocrine: No palpitations, fatigue, diaphoresis, mood swings, change in appetite,change in weight,no  increased thirst Hematologic/Lymphatic:  No anemia, purpura, petechiae. Allergic/Immunologic: no itchy/runny eyes, nasal congestion, recent allergic reactions, rashes  PHYSICAL EXAM: Filed Vitals:   10/08/15 1303  BP: 100/70  Pulse: 70   General: No acute distress, again very verbose and needs redirection Head:  Normocephalic/atraumatic Neck: supple, no paraspinal tenderness, full range of motion Heart:  Regular rate and rhythm Lungs:  Clear to auscultation bilaterally Back: No paraspinal tenderness Skin/Extremities: No rash, no edema Neurological Exam: alert and oriented to person, place, and time. No aphasia or dysarthria. Fund of knowledge is appropriate.  Recent and remote memory are intact.  Attention and concentration are normal.    Able to name objects and repeat phrases.  Cranial nerves: CN I: not tested CN II: pupils equal, round and reactive to light, left homonymous hemianopia (similar to prior), fundi unremarkable. CN III, IV, VI: full range of motion, no nystagmus, no ptosis CN V: facial sensation intact CN VII: upper and lower face symmetric CN VIII: hearing intact to finger rub CN IX, X: gag intact, uvula midline CN XI: sternocleidomastoid and trapezius muscles intact CN XII: tongue midline Bulk & Tone: normal, no fasciculations. Motor: 5/5 throughout with no pronator drift. Sensation: intact to light touch. No extinction to double simultaneous stimulation. Romberg test negative Deep Tendon Reflexes: brisk +2 throughout, no ankle clonus Plantar responses: downgoing bilaterally Cerebellar: no incoordination on finger to nose testing. No dysdiadochokinesia Gait: much improved, narrow-based and steady, able to tandem walk adequately Tremor: none  IMPRESSION: This is a 54 yo RH woman with a history of metastatic melanoma to the brain s/p suboccipital craniotomy, who had an episode of altered mental status last 03/21/15. Her MRI brain at that time was abnormal with  increased signal in the bilateral temporal occipital regions, right greater than left, with involvement of the mesial temporal lobes. A follow-up MRI done last month showed resolution of changes on the left, however the right posterior temporal and occipital lobe signal changes have progressed with superimposed cortical volume loss, no enhancement. She then had interval scans at [facility] showing a new right thalamic lesion of unclear etiology. PET scan negative for metastatic disease. She continues to do interval scans with [facility]. She is doing great physically, she continues to have the left hemianopsia and is not driving, but overall is in good spirits with no recent falls, appetite better, mild daily headaches. Continue all current medications. She will follow-up in 6 months or earlier if needed.   Thank you for allowing me to participate in her care.  Please do not hesitate to call  for any questions or concerns.  The duration of this appointment visit was 25 minutes of face-to-face time with the patient.  Greater than 50% of this time was spent in counseling, explanation of diagnosis, planning of further management, and coordination of care.   Ellouise Newer, M.D.   CC: Dr. Olen Pel

## 2015-10-08 NOTE — Patient Instructions (Signed)
1. Continue all your current medications 2. Follow-up in 6 months, call for any changes

## 2015-10-09 ENCOUNTER — Ambulatory Visit: Payer: 59 | Admitting: Neurology

## 2015-10-12 ENCOUNTER — Encounter: Payer: Self-pay | Admitting: Occupational Therapy

## 2015-10-12 NOTE — Therapy (Signed)
[place] [address] [place], [place], [address] Phone: [phone]   Fax:  [phone]  Patient Details  Name: [name] [name] MRN: [id number] Date of Birth: [birth year] Referring Provider:  No ref. provider found  Encounter Date: 10/12/2015   OCCUPATIONAL THERAPY DISCHARGE SUMMARY  Visits from Start of Care: 5  Current functional level related to goals / functional outcomes:     OT Short Term Goals - 08/13/15 1022    OT SHORT TERM GOAL #1   Title Pt will be independent with HEP for vision.--check STGs 08/19/15   Time 4   Period Weeks   Status Not fully met.     OT SHORT TERM GOAL #2   Title Pt will verbalize understanding of visual compensation strategies.   Time 4   Period Weeks   Status On-going  9/15 able to identify two strategies   OT SHORT TERM GOAL #3   Title Pt will perform a variety of complex tabletop scanning activities with at least 95% accuracy or greater.   Time 4   Period Weeks   Status Not met/unable to reassess   OT SHORT TERM GOAL #4   Title Pt will perform simple environmental scanning/navigation with at least 60% accuracy without cues safely.   Time 4   Period Weeks   Status Not met/unable to reassess   OT SHORT TERM GOAL #5   Title Pt will improve coordination for ADLs as shown by improving  time on 9-hole peg test with L hand by at least 15sec.   Time 4   Period Weeks   Status Achieved  08/11/15:  31.25sec (approximating baseline)         OT Long Term Goals - 08/13/15 1023    OT LONG TERM GOAL #1   Title Pt will verbalized understanding of memory compensations strategies for ADLs prn.--check LTGs 09/18/15   Time 8   Period Weeks   Status Not met.   OT LONG TERM GOAL #2   Title Pt will perform simple environmental scanning/navigation with at least 90% accuracy.   Time 8   Period Weeks   Status Not met   OT LONG TERM GOAL #3   Title Pt will perform simple cooking task  with good safety.   Time 8   Period Weeks   Status Not met.   OT LONG TERM GOAL #4   Title Pt will improve coordination for ADLs as shown by improving  time on 9-hole peg test with L hand by at least 25sec.   Time 4   Period Weeks   Status Achieved  08/11/15:  31.25sec (approximating baseline)        Remaining deficits: Cognitive deficits including decr attention, visual deficits   Education / Equipment: Pt instructed in vision HEP and visual compensation strategies and verbalized understanding of education provided.  Plan: Patient agrees to discharge.  Patient goals were partially met. Patient is being discharged due to not returning since the last visit.  ?????       [facility] 10/12/2015, 8:10 AM  [facility] [address] [place], [place], [address] Phone: [phone]   Fax:  [phone]   Vianne Bulls, OTR/L 10/12/2015 8:16 AM

## 2015-10-14 DIAGNOSIS — C7931 Secondary malignant neoplasm of brain: Secondary | ICD-10-CM | POA: Insufficient documentation

## 2015-11-16 ENCOUNTER — Telehealth: Payer: Self-pay | Admitting: *Deleted

## 2015-11-16 NOTE — Telephone Encounter (Signed)
On 11-16-15 fax medical records to [facility], it was consult note, follow up note,

## 2015-12-22 ENCOUNTER — Inpatient Hospital Stay
Admission: RE | Admit: 2015-12-22 | Discharge: 2015-12-22 | Disposition: A | Discharge status: Home / Self Care | Payer: Self-pay | Source: Ambulatory Visit | Attending: Radiation Oncology | Admitting: Radiation Oncology

## 2015-12-22 ENCOUNTER — Other Ambulatory Visit: Payer: Self-pay | Admitting: Radiation Oncology

## 2015-12-22 DIAGNOSIS — C801 Malignant (primary) neoplasm, unspecified: Secondary | ICD-10-CM

## 2015-12-23 ENCOUNTER — Ambulatory Visit
Admission: RE | Admit: 2015-12-23 | Discharge: 2015-12-23 | Disposition: A | Discharge status: Home / Self Care | Payer: 59 | Source: Ambulatory Visit | Attending: Radiation Oncology | Admitting: Radiation Oncology

## 2015-12-23 ENCOUNTER — Encounter: Payer: Self-pay | Admitting: Radiation Oncology

## 2015-12-23 VITALS — BP 114/64 | HR 86 | Resp 16 | Wt 103.0 lb

## 2015-12-23 DIAGNOSIS — C7931 Secondary malignant neoplasm of brain: Secondary | ICD-10-CM

## 2015-12-23 NOTE — Progress Notes (Signed)
Radiation Oncology         [phone]    Name: [name] [name]   Date: 12/23/2015   MRN: [id number]  DOB: September 24, 1961    Multidisciplinary Brain and Spine Oncology Clinic Follow-Up Visit Note  CC: Orpah Melter, MD  Eustace Moore, MD    ICD-9-CM ICD-10-CM   1. Metastatic melanoma of brain ([place]) 198.3 C79.31     Diagnosis:   55 year old woman status post resection of a 17 mm right cerebellar BRAF positive brain tumor followed by post-op conformal irradiation to 40 Gy in 10 fractions through 01/16/14  Interval Since Last Radiation:  1 year and 5  months   Narrative:  The patient returns today for routine follow-up. Returns accompanied by her husband to review [facility] scan done 12/09/15. Weight and vitals stable. Reports a constant right side headache especially behind her right eye 2 on a scale of 0-10. Reports occasionally her headaches are severe and she manages those with Percocet. Reports she is no longer driving. Patient expresses a desire to discuss with Dr. Tammi Klippel her need for an antianxiety medication. Reports intermittent "flushes" that remind her of the hot flashes she used to have. Reports floaters in her visual field for the last several day. Reports she has no peripheral vision. Reports ringing in her ears continue. Reports fatigue. Reports nausea for which she manages with phenergan but, denies vomiting. Reports she has completed all PT and OT sessions. Continues taking Cortef 10 as directed.   ALLERGIES:  is allergic to morphine and related and scopolamine.  Meds: Current Outpatient Prescriptions  Medication Sig Dispense Refill  . citalopram (CELEXA) 20 MG tablet Take by mouth.    . docusate sodium (COLACE) 100 MG capsule Take 1 capsule (100 mg total) by mouth every 12 (twelve) hours. 14 capsule 1  . esomeprazole (NEXIUM) 20 MG capsule Take 20 mg by mouth daily at 12 noon.    . gabapentin (NEURONTIN) 600 MG tablet Take 600 mg by mouth at bedtime.    . hydrocortisone  (CORTEF) 5 MG tablet Take 10 mg in am and 5 mg in pm. Sick day rules: 20 mg in am and 10 mg in pm    . levothyroxine (SYNTHROID, LEVOTHROID) 88 MCG tablet Take by mouth.    Marland Kitchen LORazepam (ATIVAN) 1 MG tablet Take 1 tablet (1 mg total) by mouth every 12 (twelve) hours as needed for anxiety or sleep. 60 tablet 0  . oxyCODONE-acetaminophen (PERCOCET/ROXICET) 5-325 MG per tablet Take 1 tablet by mouth every 4 (four) hours as needed for severe pain.    . pantoprazole (PROTONIX) 40 MG tablet Take by mouth.    . piroxicam (FELDENE) 20 MG capsule Take by mouth.    . promethazine (PHENERGAN) 25 MG suppository Place 1 suppository (25 mg total) rectally every 6 (six) hours as needed for nausea or vomiting. 12 each 0  . traMADol (ULTRAM) 50 MG tablet Take 1 tablet (50 mg total) by mouth every 12 (twelve) hours as needed for moderate pain. 50 tablet 0  . triamcinolone ointment (KENALOG) 0.1 % Apply 1 application topically 2 (two) times daily.     . citalopram (CELEXA) 20 MG tablet Take 20 mg by mouth every morning.      No current facility-administered medications for this encounter.    Physical Findings: The patient is in no acute distress. Patient is alert and oriented.  weight is 103 lb (46.72 kg). Her blood pressure is 114/64 and her pulse is 86. Her  respiration is 16 and oxygen saturation is 100%. .  No significant changes. Her radiation treatment site looks good and is healing well.  Lab Findings: Lab Results  Component Value Date   WBC 6.3 07/10/2015   HGB 15.6* 07/10/2015   HCT 45.9 07/10/2015   MCV 88.3 07/10/2015   PLT 307 07/10/2015    Radiographic Findings: Her [facility] MRI was loaded into our PACS system to be presented by neuroradiology in our brain tumor board before our visit.  MRI brain with and without contrast    07/22/2015  [place]  Result Narrative  ** MRI BRAIN WITHOUT AND WITH CONTRAST **  INDICATION: C79.9 Secondary malignant neoplasm of unspecified site,  C79.31 Secondary malignant neoplasm of brain, melanoma, evaluate brain metastasis provided.  COMPARISON: 06/10/2015  TECHNIQUE/PROTOCOL: Standard adult brain protocol.   CONTRAST: 36m MultiHance IV. This MRI was performed before and after IV administration of contrast material. IV contrast was administered to improve disease detection and further define anatomy.  *    GFR:  >60 mL/min/1.73 square meters. *    Complications:  No immediate patient complications or events noted.  FINDINGS: Redemonstration of a right suboccipital craniectomy with a surgical cavity in the right cerebellar hemisphere. There is stable surrounding T2 hyperintensity. Again seen in the right thalamus is a lesion with intrinsic T1 hyperintensity measuring 10 x 9 mm, previously 12 x 11 mm (series 10, image 20). There may be minimal enhancement. Extensive T2 hyperintensity within the white matter of the right occipital, posterior parietal, and posterior temporal lobes similar to prior exam.   There is no acute cortical infarct or intracranial hemorrhage. The extra-axial spaces and basal cisterns normal. Intracranial flow-voids appear normal. Stable ex vacuo dilatation of the right lateral ventricle. The orbits, cranium, and mastoid sinuses are unremarkable. Extensive sinus opacification with opacification of the bilateral frontal, ethmoidal, sphenoid, and maxillary sinuses.  IMPRESSION:  1. Stable examination with evidence of disease progression. 2. Extensive sinus opacification, increased from prior, with air fluid levels.  Electronically Reviewed by:  WDuke Salvia MD Electronically Reviewed on:  07/22/2015 1:57 PM  I have reviewed the images and concur with the above findings.  Electronically Signed by:  DRegis Bill MD Electronically Signed on:  07/22/2015 5:30 PM    Impression:  No evidence of recurrence at this time.  Plan:  She is scheduled to follow up with Dr. ADelice Lesch5/12/17. [facility].   _____________________________________  MSheral Apley MTammi Klippel M.D.    This document serves as a record of services personally performed by MTyler Pita MD. It was created on his behalf by ALendon Collar a trained medical scribe. The creation of this record is based on the scribe's personal observations and the provider's statements to them. This document has been checked and approved by the attending provider.

## 2015-12-23 NOTE — Progress Notes (Signed)
Returns to accompanied by her husband to review [facility] scan done 12/09/15. Weight and vitals stable. Reports a constant right side headache especially behind her right eye 2 on a scale of 0-10. Reports occasionally her headaches are severe and she manages those with Percocet. Reports she is no longer driving. She is scheduled to follow up with Dr. Delice Lesch 04/08/16. Patient expresses a desire to discuss with Dr. Tammi Klippel her need for an antianxiety medication. Reports intermittent "flushes" that remind her of the hot flashes she used to have. Reports floaters in her visual field for the last several day. Reports she has no peripheral vision. Reports ringing in her ears continue. Reports fatigue. Reports nausea for which she manages with phenergan but, denies vomiting. Reports she has completed all PT and OT sessions. Continues taking Cortef 10 as directed.   BP 114/64 mmHg  Pulse 86  Resp 16  Wt 103 lb (46.72 kg)  SpO2 100% Wt Readings from Last 3 Encounters:  12/23/15 103 lb (46.72 kg)  10/08/15 103 lb 6 oz (46.891 kg)  08/10/15 104 lb 6.4 oz (47.356 kg)

## 2016-01-13 ENCOUNTER — Encounter: Payer: Self-pay | Admitting: Neurology

## 2016-02-19 ENCOUNTER — Encounter: Payer: Self-pay | Admitting: Physical Therapy

## 2016-02-19 NOTE — Therapy (Signed)
[facility] [address] [place], [place], [address] Phone: [phone]   Fax:  [phone]  Patient Details  Name: [name] [name] MRN: [id number] Date of Birth: 1961-09-30 Referring Provider:  Christella Noa, MD  Encounter Date: 02/19/2016  PHYSICAL THERAPY DISCHARGE SUMMARY  Visits from Start of Care: 1 (evaluation only)  Current functional level related to goals / functional outcomes: Unknown, as patient did not return to PT after initial evaluation.     PT Short Term Goals - 08/13/15 1144    PT SHORT TERM GOAL #1   Title Pt will perform initial HEP with mod I using handout to maximize functional gains made in physical therapy. Target date: 09/10/15.   PT SHORT TERM GOAL #2   Title Pt will increase FGA score from 21 to 26/30 to indicate decreased fall risk, improved functional gait stability.  Target date: 09/10/15.   PT SHORT TERM GOAL #3   Title Pt will increase self-selected gait speed from 1.77 ft/sec to 2.37 ft/sec to indicate decreased risk of falling.  Target date: 09/10/15.   PT SHORT TERM GOAL #4   Title Pt will ambulate x500' over unlevel paved and grass surfaces with mod I using LRAD to indicate safety with community mobility.  Target date: 09/10/15.   PT SHORT TERM GOAL #5   Title Pt will verbalize understanding of fall prevention strategies without cueing to decrease risk of falling in home environment.  Target date: 09/10/15.      Remaining deficits: Unknown, as patient did not return to PT after initial evaluation.   Education / Equipment: PT evaluation findings, goals, and POC. Plan:                                                    Patient goals were not met. Patient is being discharged due to not returning since the last visit.  ?????        Billie Ruddy, PT, DPT [facility] [address] [place], [place], [address] Phone: [phone]   Fax:   [phone] 02/19/2016, 11:18 AM

## 2016-03-23 ENCOUNTER — Inpatient Hospital Stay
Admission: RE | Admit: 2016-03-23 | Discharge: 2016-03-23 | Disposition: A | Discharge status: Home / Self Care | Payer: Self-pay | Source: Ambulatory Visit | Attending: Radiation Oncology | Admitting: Radiation Oncology

## 2016-03-23 ENCOUNTER — Other Ambulatory Visit: Payer: Self-pay | Admitting: Radiation Oncology

## 2016-03-23 DIAGNOSIS — C801 Malignant (primary) neoplasm, unspecified: Secondary | ICD-10-CM

## 2016-03-28 ENCOUNTER — Ambulatory Visit: Payer: Self-pay | Admitting: Radiation Oncology

## 2016-03-29 ENCOUNTER — Encounter: Payer: Self-pay | Admitting: Radiation Oncology

## 2016-03-29 ENCOUNTER — Ambulatory Visit
Admission: RE | Admit: 2016-03-29 | Discharge: 2016-03-29 | Disposition: A | Discharge status: Home / Self Care | Payer: 59 | Source: Ambulatory Visit | Attending: Radiation Oncology | Admitting: Radiation Oncology

## 2016-03-29 VITALS — BP 107/74 | HR 78 | Resp 16 | Wt 102.2 lb

## 2016-03-29 DIAGNOSIS — C7931 Secondary malignant neoplasm of brain: Secondary | ICD-10-CM

## 2016-03-29 DIAGNOSIS — C7949 Secondary malignant neoplasm of other parts of nervous system: Principal | ICD-10-CM

## 2016-03-29 HISTORY — DX: Homonymous bilateral field defects, left side: H53.462

## 2016-03-29 NOTE — Progress Notes (Signed)
Returns to accompanied by her husband to review [facility] scan done 12/09/15. Vitals stable. Weight loss continues Reports a constant right side headache especially behind her right eye 2 on a scale of 0-10. Denies having any severe headaches.Reports she is no longer driving. Unsure of when she is scheduled to follow up with Delice Lesch again.  Less anxious today. Reports intermittent "flushes" that remind her of the hot flashes she used to have. Reports floaters in her visual field for the last several day. Reports she has no peripheral vision in left eye. Reports ringing in her ears continue. Patient reports less fatigue.  Reports nausea for which she manages with phenergan but, denies vomiting.   BP 107/74 mmHg  Pulse 78  Resp 16  Wt 102 lb 3.2 oz (46.358 kg)  SpO2 100% Wt Readings from Last 3 Encounters:  03/29/16 102 lb 3.2 oz (46.358 kg)  12/23/15 103 lb (46.72 kg)  10/08/15 103 lb 6 oz (46.891 kg)

## 2016-03-30 NOTE — Progress Notes (Signed)
Radiation Oncology         [phone] ________________________________  Name: [name] [name] MRN: [id number]  Date: 03/29/2016  DOB: September 13, 1961  Follow-Up Visit Note  CC: Orpah Melter, MD  Eustace Moore, MD  Diagnosis:   Metastatic Melanoma to brain, BRAF positive.    ICD-9-CM ICD-10-CM   1. Solitary brain metastasis from melanoma 198.3 C79.31     C79.49     Interval Since Last Radiation:  14  months  Completed 01/16/14: postop conformal irradiation to 40 Gy in 10 fractions to a 17 mm right cerebellar lesion  Narrative:  The patient returns today for routine follow-up.  Her MRI on 03/16/16 was reviewed with Dr. Raynelle Chary at [facility] a few weeks ago and appeared to have stable findings. The T2 hyperintensity adjacent ot the right lateral ventricle and adjacent to the cerebellar surgical site and throughout the temproal lobe and parietal lobe has decreased since January 2017. She comes for further discussion.                          On review of systems, the patient reports that she is doing well overall. She continues to have visual disturbances with her peripheral vision and with floaters. She has also seen dark spots in her visual fields. She states these are unchanged. She continues to have right sided headaches which respond to tylenol. She denies any chest pain, shortness of breath, cough, fevers, chills, night sweats, unintended weight changes. She denies any bowel or bladder disturbances, and denies abdominal pain, nausea or vomiting. She denies any new musculoskeletal or joint aches or pains. A complete review of systems is obtained and is otherwise negative.   Past Medical History:  Past Medical History  Diagnosis Date  . TMJ (dislocation of temporomandibular joint)   . Thyroid disease   . Lung cancer [facility])     awaiting pathology  . Brain cancer [facility])     awaiting pathology; assuming this is a lung primary with brain mets  . Dementia   . Hypothyroidism   . GERD  (gastroesophageal reflux disease)   . Anxiety   . Left homonymous hemianopsia     secondary to right posterior temporal and occipital lesions.    Past Surgical History: Past Surgical History  Procedure Laterality Date  . Tonsillectomy    . Appendectomy    . Suboccipital craniectomy cervical laminectomy N/A 11/19/2013    Procedure: SUBOCCIPITAL CRANIECTOMY for tumor;  Surgeon: Eustace Moore, MD;  Location: [place] NEURO ORS;  Service: Neurosurgery;  Laterality: N/A;  . Lumbar wound debridement N/A 12/05/2013    Procedure: irrigation and debridement posterior cervical wound;  Surgeon: Eustace Moore, MD;  Location: [place] NEURO ORS;  Service: Neurosurgery;  Laterality: N/A;  . Video assisted thoracoscopy (vats)/wedge resection Left 03/05/2014    Procedure: VIDEO ASSISTED THORACOSCOPY (VATS)/WEDGE RESECTION;  Surgeon: Melrose Nakayama, MD;  Location: [place];  Service: Thoracic;  Laterality: Left;    Social History:  Social History   Social History  . Marital Status: Married    Spouse Name: N/A  . Number of Children: N/A  . Years of Education: N/A   Occupational History  . Not on file.   Social History Main Topics  . Smoking status: Former Smoker    Quit date: 11/19/2013  . Smokeless tobacco: Never Used  . Alcohol Use: No  . Drug Use: No  . Sexual Activity: Not on file   Other  Topics Concern  . Not on file   Social History Narrative    Family History: Family History  Problem Relation Age of Onset  . Dementia Mother   . Cancer - Other Other     ALLERGIES:  is allergic to morphine and related and scopolamine.  Meds: Current Outpatient Prescriptions  Medication Sig Dispense Refill  . citalopram (CELEXA) 20 MG tablet Take by mouth.    . docusate sodium (COLACE) 100 MG capsule Take 1 capsule (100 mg total) by mouth every 12 (twelve) hours. 14 capsule 1  . gabapentin (NEURONTIN) 600 MG tablet Take 900 mg by mouth at bedtime.     . hydrocortisone (CORTEF) 5 MG tablet Take 10 mg  in am and 5 mg in pm. Sick day rules: 20 mg in am and 10 mg in pm    . levothyroxine (SYNTHROID, LEVOTHROID) 88 MCG tablet Take 100 mcg by mouth.     Marland Kitchen LORazepam (ATIVAN) 1 MG tablet Take 1 tablet (1 mg total) by mouth every 12 (twelve) hours as needed for anxiety or sleep. 60 tablet 0  . promethazine (PHENERGAN) 25 MG suppository Place 1 suppository (25 mg total) rectally every 6 (six) hours as needed for nausea or vomiting. 12 each 0  . traMADol (ULTRAM) 50 MG tablet Take 1 tablet (50 mg total) by mouth every 12 (twelve) hours as needed for moderate pain. 50 tablet 0  . triamcinolone ointment (KENALOG) 0.1 % Apply 1 application topically 2 (two) times daily.     . citalopram (CELEXA) 20 MG tablet Take 20 mg by mouth every morning.     Marland Kitchen esomeprazole (NEXIUM) 20 MG capsule Take 20 mg by mouth daily at 12 noon.    Marland Kitchen oxyCODONE-acetaminophen (PERCOCET/ROXICET) 5-325 MG per tablet Take 1 tablet by mouth every 4 (four) hours as needed for severe pain. Reported on 03/29/2016     No current facility-administered medications for this encounter.    Physical Findings:  weight is 102 lb 3.2 oz (46.358 kg). Her blood pressure is 107/74 and her pulse is 78. Her respiration is 16 and oxygen saturation is 100%.   Pain scale 0/10 In general this is a well appearing caucasian female in no acute distress. She's alert and oriented x4 and appropriate throughout the examination. Cardiopulmonary assessment is negative for acute distress and she exhibits normal effort.    Lab Findings: Lab Results  Component Value Date   WBC 6.3 07/10/2015   WBC 7.7 02/10/2014   HGB 15.6* 07/10/2015   HGB 12.9 02/10/2014   HCT 45.9 07/10/2015   HCT 38.8 02/10/2014   PLT 307 07/10/2015   PLT 293 02/10/2014    Lab Results  Component Value Date   NA 134* 07/10/2015   NA 142 02/10/2014   K 3.1* 07/10/2015   K 4.6 02/10/2014   CHLORIDE 108 02/10/2014   CO2 21* 07/10/2015   CO2 24 02/10/2014   GLUCOSE 76 07/10/2015    GLUCOSE 106 02/10/2014   BUN 7 07/10/2015   BUN 18.6 02/10/2014   CREATININE 0.66 07/10/2015   CREATININE 0.8 02/10/2014   BILITOT 1.0 07/10/2015   BILITOT 0.48 02/10/2014   ALKPHOS 94 07/10/2015   ALKPHOS 105 02/10/2014   AST 27 07/10/2015   AST 15 02/10/2014   ALT 16 07/10/2015   ALT 17 02/10/2014   PROT 7.4 07/10/2015   PROT 6.5 02/10/2014   ALBUMIN 4.1 07/10/2015   ALBUMIN TNP 03/21/2015   ALBUMIN 3.6 02/10/2014   CALCIUM 10.9* 07/10/2015  CALCIUM 9.5 02/10/2014   ANIONGAP 17* 07/10/2015   ANIONGAP 10 02/10/2014    Radiographic Findings: Mr Outside Films Head/face  03/23/2016  CLINICAL DATA:  This exam is stored here for comparison purposes only and was performed at an outside facility.   Please contact the originating institution for any associated interpretation or report.   Ct Outside Films Body  03/23/2016  CLINICAL DATA:  This exam is stored here for comparison purposes only and was performed at an outside facility.   Please contact the originating institution for any associated interpretation or report.    Impression/Plan: 1. Metastatic melanoma to brain. The patient continues to do well radiographically and her MRI was reviewed in our conference as well as by Dr. Raynelle Chary at [facility]. We will plan to see the patient back in about 3 months for her next MRI scan. She states agreement and understanding, and is encouraged to keep [place] informed of any questions or concerns prior to that visit. 2. Visual disturbances and headaches. These changes in her vision, floaters and peripheral, and headaches are unchanged, and occurred after a syncopal episode about 2 years ago. She has met with neuro-opthamology at [facility] who recommended routine eye exams, and with Dr. Delice Lesch in neurology. We will continue to follow this expectantly. 3. Endocrine anomalies. The patient is folllowed closely since her pituitary dysfunction, thyroid dysfunction, and andrenal insufficiency have been idenfitied. She  continues on hydrocortisone and follows up with endocrinology routinely. We will continue to follow this.  4. Nutrition. The patient is continuing to try to increase the volume of her oral intake. She is counseled on using protein as well as a source of nutrition. She is working on this at home, and declines nutrition referral at this time.   In a visit lasting 30 minutes, greater than 50% of the time was spend reviewing her MRI, and options for nutrition.     Carola Rhine, PAC

## 2016-04-08 ENCOUNTER — Ambulatory Visit: Payer: 59 | Admitting: Neurology

## 2016-07-08 ENCOUNTER — Encounter: Payer: Self-pay | Admitting: Genetic Counselor

## 2016-07-11 ENCOUNTER — Encounter: Payer: Self-pay | Admitting: Radiation Oncology

## 2016-07-11 ENCOUNTER — Ambulatory Visit
Admission: RE | Admit: 2016-07-11 | Discharge: 2016-07-11 | Disposition: A | Discharge status: Home / Self Care | Payer: 59 | Source: Ambulatory Visit | Attending: Radiation Oncology | Admitting: Radiation Oncology

## 2016-07-11 DIAGNOSIS — Z87891 Personal history of nicotine dependence: Secondary | ICD-10-CM | POA: Insufficient documentation

## 2016-07-11 DIAGNOSIS — K219 Gastro-esophageal reflux disease without esophagitis: Secondary | ICD-10-CM | POA: Insufficient documentation

## 2016-07-11 DIAGNOSIS — F0390 Unspecified dementia without behavioral disturbance: Secondary | ICD-10-CM | POA: Diagnosis not present

## 2016-07-11 DIAGNOSIS — Z85118 Personal history of other malignant neoplasm of bronchus and lung: Secondary | ICD-10-CM | POA: Insufficient documentation

## 2016-07-11 DIAGNOSIS — E039 Hypothyroidism, unspecified: Secondary | ICD-10-CM | POA: Diagnosis not present

## 2016-07-11 DIAGNOSIS — F419 Anxiety disorder, unspecified: Secondary | ICD-10-CM | POA: Insufficient documentation

## 2016-07-11 DIAGNOSIS — C7931 Secondary malignant neoplasm of brain: Secondary | ICD-10-CM | POA: Diagnosis present

## 2016-07-11 DIAGNOSIS — H5347 Heteronymous bilateral field defects: Secondary | ICD-10-CM | POA: Insufficient documentation

## 2016-07-11 NOTE — Progress Notes (Signed)
Radiation Oncology         [phone] ________________________________  Name: [name] [name] MRN: [id number]  Date: 07/11/2016  DOB: 1961/02/10  Follow-Up Visit Note  CC: Orpah Melter, MD  Eustace Moore, MD  Diagnosis:   Metastatic Melanoma to brain, BRAF positive.    ICD-9-CM ICD-10-CM   1. Metastatic melanoma of brain (HCC) 198.3 C79.31     Interval Since Last Radiation: 2 years and 6 months  Completed 01/16/14: Postop conformal irradiation to 40 Gy in 10 fractions to a 17 mm right cerebellar lesion  Narrative:  The patient returns today for routine follow-up. She continues on Keytruda with Dr. Raynelle Chary.  On review of systems, the patient reports that she is doing well overall. She reports a base of skull headache that is a 3/10, which responds to OTC medications like tylenol. She continues to have a loss of peripheral vision in her left eye, and notes floaters at times. She denies any gait imbalances and has lost 7 lbs since her last visit.  She denies any chest pain, shortness of breath, fever, cough, auditory disturbances, falls, abdominal pain, nausea or vomiting. She denies any bowel or bladder disturbances. A complete review of systems is obtained and is otherwise negative.   Past Medical History:  Past Medical History:  Diagnosis Date  . Anxiety   . Brain cancer [facility])    awaiting pathology; assuming this is a lung primary with brain mets  . Dementia   . GERD (gastroesophageal reflux disease)   . Hypothyroidism   . Left homonymous hemianopsia    secondary to right posterior temporal and occipital lesions.  . Lung cancer [facility])    awaiting pathology  . Thyroid disease   . TMJ (dislocation of temporomandibular joint)     Past Surgical History: Past Surgical History:  Procedure Laterality Date  . APPENDECTOMY    . LUMBAR WOUND DEBRIDEMENT N/A 12/05/2013   Procedure: irrigation and debridement posterior cervical wound;  Surgeon: Eustace Moore, MD;  Location: [place]  NEURO ORS;  Service: Neurosurgery;  Laterality: N/A;  . SUBOCCIPITAL CRANIECTOMY CERVICAL LAMINECTOMY N/A 11/19/2013   Procedure: SUBOCCIPITAL CRANIECTOMY for tumor;  Surgeon: Eustace Moore, MD;  Location: [place] NEURO ORS;  Service: Neurosurgery;  Laterality: N/A;  . TONSILLECTOMY    . VIDEO ASSISTED THORACOSCOPY (VATS)/WEDGE RESECTION Left 03/05/2014   Procedure: VIDEO ASSISTED THORACOSCOPY (VATS)/WEDGE RESECTION;  Surgeon: Melrose Nakayama, MD;  Location: [place];  Service: Thoracic;  Laterality: Left;    Social History:  Social History   Social History  . Marital status: Married    Spouse name: N/A  . Number of children: N/A  . Years of education: N/A   Occupational History  . Not on file.   Social History Main Topics  . Smoking status: Former Smoker    Quit date: 11/19/2013  . Smokeless tobacco: Never Used  . Alcohol use No  . Drug use: No  . Sexual activity: Not on file   Other Topics Concern  . Not on file   Social History Narrative  . No narrative on file    Family History: Family History  Problem Relation Age of Onset  . Dementia Mother   . Cancer - Other Other     ALLERGIES:  is allergic to morphine and related and scopolamine.  Meds: Current Outpatient Prescriptions  Medication Sig Dispense Refill  . citalopram (CELEXA) 20 MG tablet Take by mouth.    . esomeprazole (NEXIUM) 20 MG capsule Take  20 mg by mouth daily at 12 noon.    . gabapentin (NEURONTIN) 600 MG tablet Take 900 mg by mouth at bedtime.     . hydrocortisone (CORTEF) 5 MG tablet Take 10 mg in am and 5 mg in pm. Sick day rules: 20 mg in am and 10 mg in pm    . levothyroxine (SYNTHROID, LEVOTHROID) 88 MCG tablet Take 112 mcg by mouth.     Marland Kitchen LORazepam (ATIVAN) 1 MG tablet Take 1 tablet (1 mg total) by mouth every 12 (twelve) hours as needed for anxiety or sleep. 60 tablet 0  . oxyCODONE-acetaminophen (PERCOCET/ROXICET) 5-325 MG per tablet Take 1 tablet by mouth every 4 (four) hours as needed for  severe pain. Reported on 03/29/2016    . promethazine (PHENERGAN) 25 MG suppository Place 1 suppository (25 mg total) rectally every 6 (six) hours as needed for nausea or vomiting. 12 each 0  . traMADol (ULTRAM) 50 MG tablet Take 1 tablet (50 mg total) by mouth every 12 (twelve) hours as needed for moderate pain. 50 tablet 0  . citalopram (CELEXA) 20 MG tablet Take 20 mg by mouth every morning.      No current facility-administered medications for this encounter.     Physical Findings:  height is _0  (1.702 m) and weight is 95 lb 14.4 oz (43.5 kg). Her temperature is 97.6 F (36.4 C). Her blood pressure is 117/52 (abnormal) and her pulse is 81.   Pain scale 0/10 In general this is a well appearing caucasian female in no acute distress. She's alert and oriented x4 and appropriate throughout the examination. Cardiopulmonary assessment is negative for acute distress and she exhibits normal effort. Her midline craniotomy incision site on her posterior scalp is intact without evidence of contour abnormality.   Lab Findings: Lab Results  Component Value Date   WBC 6.3 07/10/2015   HGB 15.6 (H) 07/10/2015   HGB 12.9 02/10/2014   HCT 45.9 07/10/2015   HCT 38.8 02/10/2014   PLT 307 07/10/2015   PLT 293 02/10/2014    Lab Results  Component Value Date   NA 134 (L) 07/10/2015   NA 142 02/10/2014   K 3.1 (L) 07/10/2015   K 4.6 02/10/2014   CHLORIDE 108 02/10/2014   CO2 21 (L) 07/10/2015   CO2 24 02/10/2014   GLUCOSE 76 07/10/2015   GLUCOSE 106 02/10/2014   BUN 7 07/10/2015   BUN 18.6 02/10/2014   CREATININE 0.66 07/10/2015   CREATININE 0.8 02/10/2014   BILITOT 1.0 07/10/2015   BILITOT 0.48 02/10/2014   ALKPHOS 94 07/10/2015   ALKPHOS 105 02/10/2014   AST 27 07/10/2015   AST 15 02/10/2014   ALT 16 07/10/2015   ALT 17 02/10/2014   PROT 7.4 07/10/2015   PROT 6.5 02/10/2014   ALBUMIN 4.1 07/10/2015   ALBUMIN TNP 03/21/2015   ALBUMIN 3.6 02/10/2014   CALCIUM 10.9 (H) 07/10/2015     CALCIUM 9.5 02/10/2014   ANIONGAP 17 (H) 07/10/2015    Radiographic Findings: No results found.  Impression/Plan: 1. Metastatic melanoma to brain. The patient's most recent MRI was again reviewed in our conference this morning as well as by Dr. Raynelle Chary at [facility]. Stability is again seen on this scan and we would recommend continued observation. We will plan to see the patient back in about 4 months after her next MRI scan as she will be travelling and has discussed this interval with Dr. Raynelle Chary. The patient will keep [place] informed of any  questions or concerns prior to her next visit with [place]. 2. Visual disturbances and headaches. These changes in her vision, floaters and peripheral, and headaches remain but do not seem to be limiting her at this time in what she enjoys doing. She continues to follow up with neuro-opthalmology and Dr. Delice Lesch. 3. Endocrine anomalies. The patient is folllowed closely since her pituitary dysfunction, thyroid dysfunction, and andrenal insufficiency have been idenfitied. She continues on hydrocortisone and follows up with endocrinology routinely.This remains stable and we will continue to follow this along at subsequent visits. 4. Nutrition. The patient has continued to lose weight since her last visit. I have again emphasized the importance of being seen by a nutritionist. She declines nutrition referral at this time, and we will continue to follow this.   In a visit lasting 30 minutes, greater than 50% of the time was spend reviewing her MRI, and options for nutrition.     Carola Rhine, PAC  This document serves as a record of services personally performed by Shona Simpson, PA-C. It was created on her behalf by Darcus Austin, a trained medical scribe. The creation of this record is based on the scribe's personal observations and the provider's statements to them. This document has been checked and approved by the attending provider.

## 2016-07-11 NOTE — Progress Notes (Signed)
Mrs. Spies here for reassessment.  She reports that she has a level 3/10 headache at the base of skull today.  She reports lost of peripheral vision in her left eye. Keytruda in the past and she reports that now she has a nonfunctioning Pituitary glands. Denies any imbalance in gait when ambulating. Has lost 7 lbs since her last visit.  BP (!) 117/52 (BP Location: Right Arm, Patient Position: Sitting, Cuff Size: Small)   Pulse 81   Temp 97.6 F (36.4 C)   Ht '5\' 7"'$  (1.702 m)   Wt 95 lb 14.4 oz (43.5 kg)   BMI 15.02 kg/m    Wt Readings from Last 3 Encounters:  07/11/16 95 lb 14.4 oz (43.5 kg)  03/29/16 102 lb 3.2 oz (46.4 kg)  12/23/15 103 lb (46.7 kg)

## 2016-07-19 ENCOUNTER — Encounter: Payer: Self-pay | Admitting: Neurology

## 2016-07-19 ENCOUNTER — Ambulatory Visit (INDEPENDENT_AMBULATORY_CARE_PROVIDER_SITE_OTHER): Payer: 59 | Admitting: Neurology

## 2016-07-19 VITALS — BP 98/62 | HR 77 | Temp 98.3°F | Ht 67.0 in | Wt 96.0 lb

## 2016-07-19 DIAGNOSIS — H53462 Homonymous bilateral field defects, left side: Secondary | ICD-10-CM | POA: Diagnosis not present

## 2016-07-19 DIAGNOSIS — C7931 Secondary malignant neoplasm of brain: Secondary | ICD-10-CM

## 2016-07-19 NOTE — Patient Instructions (Signed)
You look great! Continue all your visits at [facility] and locally. Wishing you well this year, follow-up in 1 year.

## 2016-07-19 NOTE — Progress Notes (Signed)
NEUROLOGY FOLLOW UP OFFICE NOTE  BROCK MOKRY [id number]  HISTORY OF PRESENT ILLNESS: I had the pleasure of seeing [name] [name] in follow-up in the neurology clinic on 07/19/2016.  The patient was last seen 9 months ago when she presented after hospitalization for an episode of altered mental status in April 2016 with abnormal brain MRI, considerations included post-ictal changes, PRES, limbic encephalitis. CSF studies normal. She is again accompanied by her husband who helps supplement the history today.  Since her last visit, she reports doing fairly well, she is happy about her weight gain. No further confusional episodes, no falls. She continues to follow-up at [facility]. Records and images were personally reviewed where available. Last MRI done at [facility] 06/15/16 reported post-surgical changes in the right occipital region with subjacent resection cavity in the right cerebellum. Surrounding increased signal on T2 weighted imaging is unchanged, no new contrast enhancement. There is ex vacuo dilatation of the right lateral ventricle, unchanged. Stable exam without evidence of disease progression. She has also been seeing Endocrine for adrenal insufficiency. She reports that she cannot feel cold temperatures with her left hand, but denies any weakness. Left homonymous hemianopia is unchanged. She does not drive. She denies any dizziness and reports headaches are much improved since her headache specialist Dr. Domingo Cocking increased gabapentin at night.   HPI: This is a 55 yo RH woman with a history of metastatic melanoma with brain metastasis s/p craniotomy, with negative MRI brain scans at [facility] (most recently 02/25/15), in her usual state of health until 03/21/15 when she presented to [facility] ER with altered mental status. She and her husband went camping from 4/13 to 4/17. She recalls having a bad headache and was up all night. She did not recall much of the drive back, but apparently her husband had difficulty  arousing her, then she was just looking at him, babbling, diaphoretic. She was brought to [facility] ER where she was evaluated by Neurology, per notes she would have intermittent periods where she would become lucid, then become non-verbal with eyes staring off to either side. There is note that she had diarrhea and nausea the day prior. On exam, she was oriented x 1, intermittently able to provide a history then becomes less responsive, intermittently following commands. She had left-sided neglect. She had an MRI brain with and without contrast which I personally reviewed, there was prior right suboccipital craniotomy with encephalomalacia in the right cerebellar hemisphere. There was abnormal T2/FLAIR signal with edema involving predominantly the cortical gray matter of the bilateral temporal occipital regions, right greater than left, with involvement of the mesial right temporal lobe, as well as probable increased signal in the left mesial temporal lobe. Considerations included post-ictal changes, PRES, limbic encephalitis. Her routine wake and sleep EEG was abnormal due to mild diffuse slowing. She had a lumbar puncture with CSF WBC 0, RBC 2, protein 68, glucose 98. HSV, Lyme, West Nile, Enterovirus, gram stain and culture were negative. Serum Lyme and RMSF were negative. Her B12, Hepatitis panel, HIV Ab were negative. TSH elevated at 7.001. Ammonia level initially 77, down to 32 the next day. TPO Ab, NMDA Ab negative.   PAST MEDICAL HISTORY: Past Medical History:  Diagnosis Date  . Anxiety   . Brain cancer [facility])    awaiting pathology; assuming this is a lung primary with brain mets  . Dementia   . GERD (gastroesophageal reflux disease)   . Hypothyroidism   . Left homonymous hemianopsia    secondary  to right posterior temporal and occipital lesions.  . Lung cancer [facility])    awaiting pathology  . Thyroid disease   . TMJ (dislocation of temporomandibular joint)     MEDICATIONS: Current Outpatient  Prescriptions on File Prior to Visit  Medication Sig Dispense Refill  . citalopram (CELEXA) 20 MG tablet Take 20 mg by mouth every morning.     . citalopram (CELEXA) 20 MG tablet Take by mouth.    . esomeprazole (NEXIUM) 20 MG capsule Take 20 mg by mouth daily at 12 noon.    . gabapentin (NEURONTIN) 600 MG tablet Take 900 mg by mouth at bedtime.     . hydrocortisone (CORTEF) 5 MG tablet Take 10 mg in am and 5 mg in pm. Sick day rules: 20 mg in am and 10 mg in pm    . levothyroxine (SYNTHROID, LEVOTHROID) 88 MCG tablet Take 112 mcg by mouth.     Marland Kitchen LORazepam (ATIVAN) 1 MG tablet Take 1 tablet (1 mg total) by mouth every 12 (twelve) hours as needed for anxiety or sleep. 60 tablet 0  . oxyCODONE-acetaminophen (PERCOCET/ROXICET) 5-325 MG per tablet Take 1 tablet by mouth every 4 (four) hours as needed for severe pain. Reported on 03/29/2016    . promethazine (PHENERGAN) 25 MG suppository Place 1 suppository (25 mg total) rectally every 6 (six) hours as needed for nausea or vomiting. 12 each 0  . traMADol (ULTRAM) 50 MG tablet Take 1 tablet (50 mg total) by mouth every 12 (twelve) hours as needed for moderate pain. 50 tablet 0   No current facility-administered medications on file prior to visit.     ALLERGIES: Allergies  Allergen Reactions  . Morphine And Related Nausea Only  . Scopolamine Rash    FAMILY HISTORY: Family History  Problem Relation Age of Onset  . Dementia Mother   . Cancer - Other Other     SOCIAL HISTORY: Social History   Social History  . Marital status: Married    Spouse name: N/A  . Number of children: N/A  . Years of education: N/A   Occupational History  . Not on file.   Social History Main Topics  . Smoking status: Former Smoker    Quit date: 11/19/2013  . Smokeless tobacco: Never Used  . Alcohol use No  . Drug use: No  . Sexual activity: Not on file   Other Topics Concern  . Not on file   Social History Narrative  . No narrative on file     REVIEW OF SYSTEMS: Constitutional: No fevers, chills, or sweats, generalized fatigue, change in appetite Eyes: No visual changes, double vision, eye pain Ear, nose and throat: No hearing loss, ear pain, nasal congestion, sore throat Cardiovascular: No chest pain, palpitations Respiratory:  No shortness of breath at rest or with exertion, wheezes GastrointestinaI: No nausea, vomiting, diarrhea, abdominal pain, fecal incontinence Genitourinary:  No dysuria, urinary retention or frequency Musculoskeletal:  No neck pain, back pain Integumentary: No rash, pruritus, skin lesions Neurological: as above Psychiatric: No depression, insomnia, anxiety Endocrine: No palpitations, fatigue, diaphoresis, mood swings, change in appetite,+change in weight,no increased thirst Hematologic/Lymphatic:  No anemia, purpura, petechiae. Allergic/Immunologic: no itchy/runny eyes, nasal congestion, recent allergic reactions, rashes  PHYSICAL EXAM: Vitals:   07/19/16 1530  BP: 98/62  Pulse: 77  Temp: 98.3 F (36.8 C)   General: No acute distress Head:  Normocephalic, post-surgical changes in the occipital region, well-healed scar Neck: supple, no paraspinal tenderness, full range of motion Heart:  Regular rate and rhythm Lungs:  Clear to auscultation bilaterally Back: No paraspinal tenderness Skin/Extremities: No rash, no edema Neurological Exam: alert and oriented to person, place, and time. No aphasia or dysarthria. Fund of knowledge is appropriate.  Recent and remote memory are intact.  Attention and concentration are normal.    Able to name objects and repeat phrases.  Cranial nerves: CN I: not tested CN II: pupils equal, round and reactive to light, left homonymous hemianopia (similar to prior) CN III, IV, VI: full range of motion, no nystagmus, no ptosis CN V: facial sensation intact CN VII: upper and lower face symmetric CN VIII: hearing intact to finger rub CN IX, X: gag intact, uvula  midline CN XI: sternocleidomastoid and trapezius muscles intact CN XII: tongue midline Bulk & Tone: normal, no fasciculations. Motor: 5/5 throughout with no pronator drift. Sensation: intact to light touch. No extinction to double simultaneous stimulation. Romberg test negative Deep Tendon Reflexes: brisk +2 throughout, no ankle clonus Plantar responses: downgoing bilaterally Cerebellar: no incoordination on finger to nose testing. No dysdiadochokinesia Gait: much improved, narrow-based and steady, able to tandem walk adequately Tremor: none  IMPRESSION: This is a 55 yo RH woman with a history of metastatic melanoma to the brain s/p suboccipital craniotomy, who had an episode of altered mental status last 03/21/15. Her MRI brain at that time was abnormal with increased signal in the bilateral temporal occipital regions, right greater than left, with involvement of the mesial temporal lobes. A follow-up MRI done last month showed resolution of changes on the left, however the right posterior temporal and occipital lobe signal changes have progressed with superimposed cortical volume loss, no enhancement. She then had interval scans at [facility] reported as stable. PET scan negative for metastatic disease. She continues to do interval scans with [facility]. She has improved well neurologically, with only left hemianopsia on exam. She has not fallen anymore. Continue all current medications. She would like to continue follow-up in our office annually to have a local neurologist. She knows to call for any changes.   Thank you for allowing me to participate in her care.  Please do not hesitate to call for any questions or concerns.  The duration of this appointment visit was 15 minutes of face-to-face time with the patient.  Greater than 50% of this time was spent in counseling, explanation of diagnosis, planning of further management, and coordination of care.   Ellouise Newer, M.D.   CC: Dr.  Olen Pel

## 2016-10-19 ENCOUNTER — Other Ambulatory Visit: Payer: Self-pay | Admitting: Radiation Oncology

## 2016-10-19 ENCOUNTER — Ambulatory Visit
Admission: RE | Admit: 2016-10-19 | Discharge: 2016-10-19 | Disposition: A | Discharge status: Home / Self Care | Payer: 59 | Source: Ambulatory Visit | Attending: Radiation Oncology | Admitting: Radiation Oncology

## 2016-10-19 DIAGNOSIS — C801 Malignant (primary) neoplasm, unspecified: Secondary | ICD-10-CM

## 2016-10-27 ENCOUNTER — Ambulatory Visit
Admission: RE | Admit: 2016-10-27 | Discharge: 2016-10-27 | Disposition: A | Discharge status: Home / Self Care | Payer: Self-pay | Source: Ambulatory Visit | Attending: Radiation Oncology | Admitting: Radiation Oncology

## 2016-10-27 ENCOUNTER — Encounter: Payer: Self-pay | Admitting: Radiation Oncology

## 2016-10-27 VITALS — BP 107/71 | HR 85 | Resp 16 | Wt 118.4 lb

## 2016-10-27 DIAGNOSIS — C7931 Secondary malignant neoplasm of brain: Secondary | ICD-10-CM | POA: Insufficient documentation

## 2016-10-27 DIAGNOSIS — Z87891 Personal history of nicotine dependence: Secondary | ICD-10-CM | POA: Insufficient documentation

## 2016-10-27 DIAGNOSIS — C439 Malignant melanoma of skin, unspecified: Secondary | ICD-10-CM | POA: Insufficient documentation

## 2016-10-27 DIAGNOSIS — Z79899 Other long term (current) drug therapy: Secondary | ICD-10-CM | POA: Insufficient documentation

## 2016-10-27 DIAGNOSIS — Z5189 Encounter for other specified aftercare: Secondary | ICD-10-CM | POA: Insufficient documentation

## 2016-10-27 DIAGNOSIS — Z9889 Other specified postprocedural states: Secondary | ICD-10-CM | POA: Insufficient documentation

## 2016-10-27 DIAGNOSIS — C7949 Secondary malignant neoplasm of other parts of nervous system: Secondary | ICD-10-CM

## 2016-10-27 NOTE — Progress Notes (Signed)
Radiation Oncology         [phone]    Name: [name] [name]   Date: 10/27/2016   MRN: [id number]  DOB: 03-11-1961    Multidisciplinary Brain and Spine Oncology Clinic Follow-Up Visit Note  CC: Orpah Melter, MD  Eustace Moore, MD  No diagnosis found.  Diagnosis:   55 year old woman status post resection of a 17 mm right cerebellar BRAF positive brain tumor followed by post-op conformal irradiation to 40 Gy in 10 fractions through 01/16/14  Interval Since Last Radiation:  2 year and 9 months   12/26/2013-01/16/2014: 1.  The posterior fossa was initially treated to 30 Gy in 12 fractions of 2.5 Gy 2.  The resection cavity was boosted to 40 Gy with 4 more fractions of 2.5 Gy   Narrative:  The patient returns today for routine follow-up. Returns to review her [facility] MRI performed on 10/12/2016. The patient recently lost her job and will speak about this with our patient navigator as well as future follow up scheduling.   On review of systems, the patient reports that she is doing well overall.  She denies any chest pain, shortness of breath, cough, fevers, chills, night sweats, unintended weight changes. She denies any bowel or bladder disturbances, and denies abdominal pain. She reports nausea and vomiting have resolved. She reports tinnitus is unchanged as well as intermittent occipital headaches. She denies any new musculoskeletal or joint aches or pains. She denies diplopia. Steady gait noted. A complete review of systems is obtained and is otherwise negative.   Past Medical History:  Past Medical History:  Diagnosis Date  . Anxiety   . Brain cancer [facility])    awaiting pathology; assuming this is a lung primary with brain mets  . Dementia   . GERD (gastroesophageal reflux disease)   . Hypothyroidism   . Left homonymous hemianopsia    secondary to right posterior temporal and occipital lesions.  . Lung cancer [facility])    awaiting pathology  . Thyroid disease   . TMJ  (dislocation of temporomandibular joint)     Past Surgical History: Past Surgical History:  Procedure Laterality Date  . APPENDECTOMY    . LUMBAR WOUND DEBRIDEMENT N/A 12/05/2013   Procedure: irrigation and debridement posterior cervical wound;  Surgeon: Eustace Moore, MD;  Location: [place] NEURO ORS;  Service: Neurosurgery;  Laterality: N/A;  . SUBOCCIPITAL CRANIECTOMY CERVICAL LAMINECTOMY N/A 11/19/2013   Procedure: SUBOCCIPITAL CRANIECTOMY for tumor;  Surgeon: Eustace Moore, MD;  Location: [place] NEURO ORS;  Service: Neurosurgery;  Laterality: N/A;  . TONSILLECTOMY    . VIDEO ASSISTED THORACOSCOPY (VATS)/WEDGE RESECTION Left 03/05/2014   Procedure: VIDEO ASSISTED THORACOSCOPY (VATS)/WEDGE RESECTION;  Surgeon: Melrose Nakayama, MD;  Location: [place];  Service: Thoracic;  Laterality: Left;    Social History:  Social History   Social History  . Marital status: Married    Spouse name: N/A  . Number of children: N/A  . Years of education: N/A   Occupational History  . Not on file.   Social History Main Topics  . Smoking status: Former Smoker    Quit date: 11/19/2013  . Smokeless tobacco: Never Used  . Alcohol use No  . Drug use: No  . Sexual activity: Not on file   Other Topics Concern  . Not on file   Social History Narrative  . No narrative on file    Family History: Family History  Problem Relation Age of Onset  . Dementia Mother   .  Cancer - Other Other      ALLERGIES:  is allergic to morphine and related and scopolamine.  Meds: Current Outpatient Prescriptions  Medication Sig Dispense Refill  . citalopram (CELEXA) 20 MG tablet Take by mouth.    . esomeprazole (NEXIUM) 20 MG capsule Take 20 mg by mouth daily at 12 noon.    . gabapentin (NEURONTIN) 600 MG tablet Take 900 mg by mouth at bedtime.     . hydrocortisone (CORTEF) 5 MG tablet Take 10 mg in am and 5 mg in pm. Sick day rules: 20 mg in am and 10 mg in pm    . levothyroxine (SYNTHROID, LEVOTHROID) 100 MCG  tablet Take by mouth.    Marland Kitchen LORazepam (ATIVAN) 1 MG tablet Take 1 tablet (1 mg total) by mouth every 12 (twelve) hours as needed for anxiety or sleep. 60 tablet 0  . oxyCODONE-acetaminophen (PERCOCET/ROXICET) 5-325 MG per tablet Take 1 tablet by mouth every 4 (four) hours as needed for severe pain. Reported on 03/29/2016    . promethazine (PHENERGAN) 25 MG suppository Place 1 suppository (25 mg total) rectally every 6 (six) hours as needed for nausea or vomiting. 12 each 0  . traMADol (ULTRAM) 50 MG tablet Take 1 tablet (50 mg total) by mouth every 12 (twelve) hours as needed for moderate pain. 50 tablet 0   No current facility-administered medications for this encounter.     Physical Findings:  weight is 118 lb 6.4 oz (53.7 kg). Her blood pressure is 107/71 and her pulse is 85. Her respiration is 16 and oxygen saturation is 100%.    In general this is a well appearing Caucasian female in no acute distress. She's alert and oriented x4 and appropriate throughout the examination. Cardiopulmonary assessment is negative for acute distress and she exhibits normal effort. Her craniectomy scar is intact without palpable abnormalities.  Lab Findings: Lab Results  Component Value Date   WBC 6.3 07/10/2015   HGB 15.6 (H) 07/10/2015   HCT 45.9 07/10/2015   MCV 88.3 07/10/2015   PLT 307 07/10/2015    Radiographic Findings: Her [facility] MRI was loaded into our PACS system to be presented by neuroradiology in our brain tumor board before our visit.  MRI brain with and without contrast 10/12/2016 [facility] Result Narrative  ** MRI BRAIN WITHOUT AND WITH CONTRAST **  INDICATION: melanoma, E27.40 Unspecified adrenocortical insufficiency (CMS-HCC), C43.9 Malignant melanoma of skin, unspecified (CMS-HCC), Z92.89 Personal history of other medical treatment.  COMPARISON: Brain MRI 06/15/2016 and 03/16/2016.  TECHNIQUE/PROTOCOL: Standard adult brain protocol.  CONTRAST: 10 mL of MultiHance  IV. This MRI was performed before and after IV administration of contrast material. IV contrast was administered to improve disease detection and further define anatomy.  *GFR:>60 mL/min/1.73 square meters. *Complications:No immediate patient complications or events noted.  FINDINGS:  Postoperative changes of right occipital craniotomy with subjacent volume loss unchanged. Surrounding increased signal on T2 weighted imaging is unchanged. No new or nodular contrast enhancement. Stable encephalomalacia of right occipital lobe with ex vacuo dilatation of the right occipital horn. Stable vascular enhancements throughout the cranium.   No acute cortical infarction or intracranial hemorrhage. No hydrocephalus. The basal cisterns are maintained. Intracranial flow-voids appear normal. The orbits, mastoid sinuses, and visualized paranasal sinuses are unremarkable.  IMPRESSION: Stable examination without evidence of disease progression.     Electronically Reviewed ZO:[id number] Roosevelt Locks, MD Electronically Reviewed on:10/12/2016 4:37 PM  I have reviewed the images and concur with the above findings.  Electronically  Signed [id number] Jabier Mutton, MD Electronically Signed on:10/12/2016 4:44 PM    Impression/Plan:   51. 55 year old woman with metastatic melanoma to the brain. The patient's images were reviewed and do not reveal evidence of recurrence at this time. We will plan to see her in 4 month's time for repeat MRI, she is also going to discuss her plans for follow up with [facility], and would like to consolidate her care down to one group. She will keep [place] notified of how she'd like to move forward at her next interval.     Carola Rhine, PAC   This document serves as a record of services personally performed by Shona Simpson, PA-C. It was created on his behalf by Arlyce Harman, a trained medical scribe. The creation of this record is based on the scribe's personal  observations and the provider's statements to them. This document has been checked and approved by the attending provider.

## 2016-10-27 NOTE — Addendum Note (Signed)
Encounter addended by: Heywood Footman, RN on: 10/27/2016  1:57 PM<BR>    Actions taken: Charge Capture section accepted

## 2016-10-27 NOTE — Progress Notes (Signed)
Patient returns to the clinic today to review recent MRI done at [facility].Weight gain noted. Vitals stable. Reports intermittent occipital headaches. Reports nausea and vomiting have resolved. Reports tinnitus is unchanged. Denies diplopia. Steady gait noted.   BP 107/71   Pulse 85   Resp 16   Wt 118 lb 6.4 oz (53.7 kg)   SpO2 100%   BMI 18.54 kg/m  Wt Readings from Last 3 Encounters:  10/27/16 118 lb 6.4 oz (53.7 kg)  07/19/16 96 lb (43.5 kg)  07/11/16 95 lb 14.4 oz (43.5 kg)

## 2016-12-22 DIAGNOSIS — G43719 Chronic migraine without aura, intractable, without status migrainosus: Secondary | ICD-10-CM | POA: Diagnosis not present

## 2016-12-22 DIAGNOSIS — R51 Headache: Secondary | ICD-10-CM | POA: Diagnosis not present

## 2016-12-28 ENCOUNTER — Telehealth: Payer: Self-pay | Admitting: Emergency Medicine

## 2016-12-28 NOTE — Telephone Encounter (Signed)
Opened in error

## 2016-12-28 NOTE — Telephone Encounter (Deleted)
Patients pharmacy sent fax that Anusol suppositories are not covered under patients insurance. They suggested Proctosol- HC rectal cream 2.5% bid. OK per Alonza Bogus, PA-C.

## 2017-02-13 DIAGNOSIS — E2740 Unspecified adrenocortical insufficiency: Secondary | ICD-10-CM | POA: Diagnosis not present

## 2017-02-13 DIAGNOSIS — R911 Solitary pulmonary nodule: Secondary | ICD-10-CM | POA: Diagnosis not present

## 2017-02-13 DIAGNOSIS — E063 Autoimmune thyroiditis: Secondary | ICD-10-CM | POA: Diagnosis not present

## 2017-02-13 DIAGNOSIS — C439 Malignant melanoma of skin, unspecified: Secondary | ICD-10-CM | POA: Diagnosis not present

## 2017-02-13 DIAGNOSIS — E038 Other specified hypothyroidism: Secondary | ICD-10-CM | POA: Diagnosis not present

## 2017-02-13 DIAGNOSIS — Z9289 Personal history of other medical treatment: Secondary | ICD-10-CM | POA: Diagnosis not present

## 2017-02-13 DIAGNOSIS — Z08 Encounter for follow-up examination after completed treatment for malignant neoplasm: Secondary | ICD-10-CM | POA: Diagnosis not present

## 2017-02-22 DIAGNOSIS — R110 Nausea: Secondary | ICD-10-CM | POA: Diagnosis not present

## 2017-02-22 DIAGNOSIS — C439 Malignant melanoma of skin, unspecified: Secondary | ICD-10-CM | POA: Diagnosis not present

## 2017-02-22 DIAGNOSIS — R51 Headache: Secondary | ICD-10-CM | POA: Diagnosis not present

## 2017-02-22 DIAGNOSIS — Z515 Encounter for palliative care: Secondary | ICD-10-CM | POA: Diagnosis not present

## 2017-02-22 DIAGNOSIS — R64 Cachexia: Secondary | ICD-10-CM | POA: Diagnosis not present

## 2017-02-22 DIAGNOSIS — F419 Anxiety disorder, unspecified: Secondary | ICD-10-CM | POA: Diagnosis not present

## 2017-02-22 DIAGNOSIS — E2740 Unspecified adrenocortical insufficiency: Secondary | ICD-10-CM | POA: Diagnosis not present

## 2017-03-29 DIAGNOSIS — I517 Cardiomegaly: Secondary | ICD-10-CM | POA: Diagnosis not present

## 2017-03-29 DIAGNOSIS — D72829 Elevated white blood cell count, unspecified: Secondary | ICD-10-CM | POA: Diagnosis not present

## 2017-03-29 DIAGNOSIS — E876 Hypokalemia: Secondary | ICD-10-CM | POA: Diagnosis not present

## 2017-03-29 DIAGNOSIS — Z79899 Other long term (current) drug therapy: Secondary | ICD-10-CM | POA: Diagnosis not present

## 2017-03-29 DIAGNOSIS — R739 Hyperglycemia, unspecified: Secondary | ICD-10-CM | POA: Diagnosis not present

## 2017-03-29 DIAGNOSIS — R197 Diarrhea, unspecified: Secondary | ICD-10-CM | POA: Diagnosis not present

## 2017-03-29 DIAGNOSIS — Z87891 Personal history of nicotine dependence: Secondary | ICD-10-CM | POA: Diagnosis not present

## 2017-03-29 DIAGNOSIS — R51 Headache: Secondary | ICD-10-CM | POA: Diagnosis not present

## 2017-03-29 DIAGNOSIS — G43A0 Cyclical vomiting, not intractable: Secondary | ICD-10-CM | POA: Diagnosis not present

## 2017-03-29 DIAGNOSIS — R112 Nausea with vomiting, unspecified: Secondary | ICD-10-CM | POA: Diagnosis not present

## 2017-03-29 DIAGNOSIS — R7309 Other abnormal glucose: Secondary | ICD-10-CM | POA: Diagnosis not present

## 2017-03-30 DIAGNOSIS — R112 Nausea with vomiting, unspecified: Secondary | ICD-10-CM | POA: Diagnosis not present

## 2017-03-30 DIAGNOSIS — R197 Diarrhea, unspecified: Secondary | ICD-10-CM | POA: Diagnosis not present

## 2017-03-31 ENCOUNTER — Inpatient Hospital Stay (HOSPITAL_COMMUNITY)
Admission: EM | Admit: 2017-03-31 | Discharge: 2017-04-03 | DRG: 313 | Disposition: A | Discharge status: Home / Self Care | Payer: Medicare HMO | Attending: Internal Medicine | Admitting: Internal Medicine

## 2017-03-31 ENCOUNTER — Encounter (HOSPITAL_COMMUNITY): Payer: Self-pay | Admitting: Emergency Medicine

## 2017-03-31 ENCOUNTER — Emergency Department (HOSPITAL_COMMUNITY): Payer: Medicare HMO

## 2017-03-31 DIAGNOSIS — F0390 Unspecified dementia without behavioral disturbance: Secondary | ICD-10-CM | POA: Diagnosis present

## 2017-03-31 DIAGNOSIS — Z85118 Personal history of other malignant neoplasm of bronchus and lung: Secondary | ICD-10-CM | POA: Diagnosis not present

## 2017-03-31 DIAGNOSIS — Z923 Personal history of irradiation: Secondary | ICD-10-CM | POA: Diagnosis not present

## 2017-03-31 DIAGNOSIS — Z87891 Personal history of nicotine dependence: Secondary | ICD-10-CM

## 2017-03-31 DIAGNOSIS — R000 Tachycardia, unspecified: Secondary | ICD-10-CM | POA: Diagnosis present

## 2017-03-31 DIAGNOSIS — Z7952 Long term (current) use of systemic steroids: Secondary | ICD-10-CM

## 2017-03-31 DIAGNOSIS — K219 Gastro-esophageal reflux disease without esophagitis: Secondary | ICD-10-CM | POA: Diagnosis present

## 2017-03-31 DIAGNOSIS — E872 Acidosis: Secondary | ICD-10-CM | POA: Diagnosis present

## 2017-03-31 DIAGNOSIS — I10 Essential (primary) hypertension: Secondary | ICD-10-CM | POA: Diagnosis present

## 2017-03-31 DIAGNOSIS — E2740 Unspecified adrenocortical insufficiency: Secondary | ICD-10-CM | POA: Diagnosis not present

## 2017-03-31 DIAGNOSIS — K529 Noninfective gastroenteritis and colitis, unspecified: Secondary | ICD-10-CM | POA: Diagnosis not present

## 2017-03-31 DIAGNOSIS — Z885 Allergy status to narcotic agent status: Secondary | ICD-10-CM

## 2017-03-31 DIAGNOSIS — Z85820 Personal history of malignant melanoma of skin: Secondary | ICD-10-CM

## 2017-03-31 DIAGNOSIS — R778 Other specified abnormalities of plasma proteins: Secondary | ICD-10-CM | POA: Diagnosis present

## 2017-03-31 DIAGNOSIS — E43 Unspecified severe protein-calorie malnutrition: Secondary | ICD-10-CM | POA: Diagnosis present

## 2017-03-31 DIAGNOSIS — Z9889 Other specified postprocedural states: Secondary | ICD-10-CM

## 2017-03-31 DIAGNOSIS — R002 Palpitations: Secondary | ICD-10-CM | POA: Diagnosis present

## 2017-03-31 DIAGNOSIS — F419 Anxiety disorder, unspecified: Secondary | ICD-10-CM | POA: Diagnosis present

## 2017-03-31 DIAGNOSIS — H53462 Homonymous bilateral field defects, left side: Secondary | ICD-10-CM | POA: Diagnosis present

## 2017-03-31 DIAGNOSIS — Z888 Allergy status to other drugs, medicaments and biological substances status: Secondary | ICD-10-CM

## 2017-03-31 DIAGNOSIS — Z8589 Personal history of malignant neoplasm of other organs and systems: Secondary | ICD-10-CM | POA: Diagnosis present

## 2017-03-31 DIAGNOSIS — E039 Hypothyroidism, unspecified: Secondary | ICD-10-CM | POA: Diagnosis present

## 2017-03-31 DIAGNOSIS — R7989 Other specified abnormal findings of blood chemistry: Secondary | ICD-10-CM | POA: Diagnosis present

## 2017-03-31 DIAGNOSIS — C7931 Secondary malignant neoplasm of brain: Secondary | ICD-10-CM | POA: Diagnosis not present

## 2017-03-31 DIAGNOSIS — G9389 Other specified disorders of brain: Secondary | ICD-10-CM

## 2017-03-31 DIAGNOSIS — I959 Hypotension, unspecified: Secondary | ICD-10-CM | POA: Diagnosis present

## 2017-03-31 DIAGNOSIS — I200 Unstable angina: Secondary | ICD-10-CM | POA: Diagnosis not present

## 2017-03-31 DIAGNOSIS — R079 Chest pain, unspecified: Secondary | ICD-10-CM | POA: Diagnosis not present

## 2017-03-31 DIAGNOSIS — Z9221 Personal history of antineoplastic chemotherapy: Secondary | ICD-10-CM

## 2017-03-31 DIAGNOSIS — E876 Hypokalemia: Secondary | ICD-10-CM | POA: Diagnosis present

## 2017-03-31 DIAGNOSIS — R1110 Vomiting, unspecified: Secondary | ICD-10-CM | POA: Diagnosis present

## 2017-03-31 DIAGNOSIS — R748 Abnormal levels of other serum enzymes: Secondary | ICD-10-CM | POA: Diagnosis not present

## 2017-03-31 DIAGNOSIS — Z681 Body mass index (BMI) 19 or less, adult: Secondary | ICD-10-CM

## 2017-03-31 DIAGNOSIS — C7800 Secondary malignant neoplasm of unspecified lung: Secondary | ICD-10-CM | POA: Diagnosis present

## 2017-03-31 DIAGNOSIS — R197 Diarrhea, unspecified: Secondary | ICD-10-CM | POA: Diagnosis not present

## 2017-03-31 DIAGNOSIS — T451X5S Adverse effect of antineoplastic and immunosuppressive drugs, sequela: Secondary | ICD-10-CM

## 2017-03-31 DIAGNOSIS — Z720 Tobacco use: Secondary | ICD-10-CM | POA: Diagnosis present

## 2017-03-31 DIAGNOSIS — R112 Nausea with vomiting, unspecified: Secondary | ICD-10-CM | POA: Diagnosis not present

## 2017-03-31 DIAGNOSIS — R0789 Other chest pain: Secondary | ICD-10-CM | POA: Diagnosis not present

## 2017-03-31 LAB — I-STAT TROPONIN, ED: Troponin i, poc: 0.05 ng/mL (ref 0.00–0.08)

## 2017-03-31 LAB — BASIC METABOLIC PANEL
Anion gap: 12 (ref 5–15)
BUN: 7 mg/dL (ref 6–20)
CALCIUM: 9.3 mg/dL (ref 8.9–10.3)
CO2: 21 mmol/L — ABNORMAL LOW (ref 22–32)
Chloride: 102 mmol/L (ref 101–111)
Creatinine, Ser: 0.81 mg/dL (ref 0.44–1.00)
GFR calc Af Amer: >60 mL/min (ref >60)
GFR calc non Af Amer: >60 mL/min (ref >60)
GLUCOSE: 109 mg/dL — AB (ref 65–99)
Potassium: 2.8 mmol/L — ABNORMAL LOW (ref 3.5–5.1)
Sodium: 135 mmol/L (ref 135–145)

## 2017-03-31 LAB — CBC WITH DIFFERENTIAL/PLATELET
BASOS PCT: 0 %
Basophils Absolute: 0 10*3/uL (ref 0.0–0.1)
Eosinophils Absolute: 0.2 10*3/uL (ref 0.0–0.7)
Eosinophils Relative: 2 %
HEMATOCRIT: 39 % (ref 36.0–46.0)
Hemoglobin: 13 g/dL (ref 12.0–15.0)
LYMPHS ABS: 2.6 10*3/uL (ref 0.7–4.0)
Lymphocytes Relative: 19 %
MCH: 28 pg (ref 26.0–34.0)
MCHC: 33.3 g/dL (ref 30.0–36.0)
MCV: 83.9 fL (ref 78.0–100.0)
MONOS PCT: 11 %
Monocytes Absolute: 1.5 10*3/uL — ABNORMAL HIGH (ref 0.1–1.0)
NEUTROS ABS: 9.2 10*3/uL — AB (ref 1.7–7.7)
Neutrophils Relative %: 68 %
Platelets: 323 10*3/uL (ref 150–400)
RBC: 4.65 MIL/uL (ref 3.87–5.11)
RDW: 15 % (ref 11.5–15.5)
WBC: 13.6 10*3/uL — ABNORMAL HIGH (ref 4.0–10.5)

## 2017-03-31 LAB — TROPONIN I
TROPONIN I: 0.06 ng/mL — AB (ref <0.03)
TROPONIN I: 0.08 ng/mL — AB (ref <0.03)
TROPONIN I: 0.06 ng/mL — AB (ref <0.03)
Troponin I: 0.09 ng/mL (ref <0.03)

## 2017-03-31 LAB — C DIFFICILE QUICK SCREEN W PCR REFLEX
C DIFFICILE (CDIFF) TOXIN: NEGATIVE
C Diff antigen: NEGATIVE
C Diff interpretation: NOT DETECTED

## 2017-03-31 LAB — MAGNESIUM: Magnesium: 1.6 mg/dL — ABNORMAL LOW (ref 1.7–2.4)

## 2017-03-31 MED ORDER — ONDANSETRON HCL 4 MG/2ML IJ SOLN
4.0000 mg | Freq: Once | INTRAMUSCULAR | Status: AC
  Administered 2017-03-31: 4 mg via INTRAVENOUS
  Filled 2017-03-31: qty 2

## 2017-03-31 MED ORDER — HYDROCORTISONE 5 MG PO TABS
5.0000 mg | ORAL_TABLET | Freq: Every evening | ORAL | Status: DC
  Filled 2017-03-31: qty 1

## 2017-03-31 MED ORDER — BACID PO TABS
2.0000 | ORAL_TABLET | Freq: Three times a day (TID) | ORAL | Status: DC
  Administered 2017-03-31 – 2017-04-03 (×9): 2 via ORAL
  Filled 2017-03-31 (×11): qty 2

## 2017-03-31 MED ORDER — HYDROCORTISONE 5 MG PO TABS
5.0000 mg | ORAL_TABLET | Freq: Three times a day (TID) | ORAL | Status: DC
  Administered 2017-03-31 – 2017-04-01 (×2): 5 mg via ORAL
  Filled 2017-03-31 (×5): qty 1

## 2017-03-31 MED ORDER — POTASSIUM CHLORIDE CRYS ER 20 MEQ PO TBCR
40.0000 meq | EXTENDED_RELEASE_TABLET | Freq: Once | ORAL | Status: AC
  Administered 2017-03-31: 40 meq via ORAL
  Filled 2017-03-31: qty 2

## 2017-03-31 MED ORDER — ENOXAPARIN SODIUM 40 MG/0.4ML ~~LOC~~ SOLN: 40.0000 mg | SUBCUTANEOUS | Status: DC

## 2017-03-31 MED ORDER — SODIUM CHLORIDE 0.9 % IV BOLUS (SEPSIS)
1000.0000 mL | Freq: Once | INTRAVENOUS | Status: AC
  Administered 2017-03-31: 1000 mL via INTRAVENOUS

## 2017-03-31 MED ORDER — ACETAMINOPHEN 325 MG PO TABS: 650.0000 mg | ORAL_TABLET | Freq: Four times a day (QID) | ORAL | Status: DC | PRN

## 2017-03-31 MED ORDER — BISACODYL 10 MG RE SUPP: 10.0000 mg | Freq: Every day | RECTAL | Status: DC | PRN

## 2017-03-31 MED ORDER — LEVOTHYROXINE SODIUM 100 MCG PO TABS
100.0000 ug | ORAL_TABLET | Freq: Every day | ORAL | Status: DC
  Administered 2017-04-01: 100 ug via ORAL
  Filled 2017-03-31: qty 1

## 2017-03-31 MED ORDER — POTASSIUM CHLORIDE 10 MEQ/100ML IV SOLN
10.0000 meq | INTRAVENOUS | Status: DC
  Administered 2017-03-31: 10 meq via INTRAVENOUS
  Filled 2017-03-31: qty 100

## 2017-03-31 MED ORDER — PROMETHAZINE HCL 25 MG RE SUPP
25.0000 mg | Freq: Four times a day (QID) | RECTAL | Status: DC | PRN
  Filled 2017-03-31: qty 1

## 2017-03-31 MED ORDER — SODIUM CHLORIDE 0.9 % IV SOLN
INTRAVENOUS | Status: DC
  Administered 2017-03-31 – 2017-04-03 (×5): via INTRAVENOUS

## 2017-03-31 MED ORDER — MIRTAZAPINE 30 MG PO TABS
30.0000 mg | ORAL_TABLET | Freq: Every day | ORAL | Status: DC
  Administered 2017-03-31 – 2017-04-02 (×3): 30 mg via ORAL
  Filled 2017-03-31 (×3): qty 1

## 2017-03-31 MED ORDER — ONDANSETRON HCL 4 MG/2ML IJ SOLN
4.0000 mg | Freq: Four times a day (QID) | INTRAMUSCULAR | Status: DC | PRN
  Administered 2017-03-31 – 2017-04-01 (×2): 4 mg via INTRAVENOUS
  Filled 2017-03-31 (×2): qty 2

## 2017-03-31 MED ORDER — HYDROCORTISONE 10 MG PO TABS
10.0000 mg | ORAL_TABLET | Freq: Every day | ORAL | Status: DC
  Administered 2017-03-31: 10 mg via ORAL
  Filled 2017-03-31 (×2): qty 1

## 2017-03-31 MED ORDER — ACETAMINOPHEN 650 MG RE SUPP: 650.0000 mg | Freq: Four times a day (QID) | RECTAL | Status: DC | PRN

## 2017-03-31 MED ORDER — POTASSIUM CHLORIDE 10 MEQ/50ML IV SOLN
10.0000 meq | INTRAVENOUS | Status: DC
  Filled 2017-03-31 (×4): qty 50

## 2017-03-31 MED ORDER — ONDANSETRON HCL 4 MG PO TABS: 4.0000 mg | ORAL_TABLET | Freq: Four times a day (QID) | ORAL | Status: DC | PRN

## 2017-03-31 MED ORDER — HYDROCORTISONE 5 MG PO TABS: 5.0000 mg | ORAL_TABLET | Freq: Two times a day (BID) | ORAL | Status: DC

## 2017-03-31 MED ORDER — SENNOSIDES-DOCUSATE SODIUM 8.6-50 MG PO TABS
1.0000 | ORAL_TABLET | Freq: Every evening | ORAL | Status: DC | PRN
  Filled 2017-03-31: qty 1

## 2017-03-31 MED ORDER — ACETAMINOPHEN 325 MG PO TABS
650.0000 mg | ORAL_TABLET | ORAL | Status: DC | PRN
  Administered 2017-04-01 (×2): 650 mg via ORAL
  Filled 2017-03-31 (×2): qty 2

## 2017-03-31 MED ORDER — POTASSIUM CHLORIDE 10 MEQ/100ML IV SOLN
10.0000 meq | INTRAVENOUS | Status: AC
  Administered 2017-03-31 (×3): 10 meq via INTRAVENOUS
  Filled 2017-03-31 (×3): qty 100

## 2017-03-31 MED ORDER — SODIUM CHLORIDE 0.9 % IV SOLN: INTRAVENOUS | Status: DC

## 2017-03-31 MED ORDER — SIMETHICONE 80 MG PO CHEW
80.0000 mg | CHEWABLE_TABLET | Freq: Four times a day (QID) | ORAL | Status: DC | PRN
  Filled 2017-03-31: qty 1

## 2017-03-31 MED ORDER — ONDANSETRON HCL 4 MG/2ML IJ SOLN: 4.0000 mg | Freq: Four times a day (QID) | INTRAMUSCULAR | Status: DC | PRN

## 2017-03-31 MED ORDER — ENOXAPARIN SODIUM 40 MG/0.4ML ~~LOC~~ SOLN
40.0000 mg | SUBCUTANEOUS | Status: DC
  Filled 2017-03-31 (×2): qty 0.4

## 2017-03-31 MED ORDER — GABAPENTIN 600 MG PO TABS
900.0000 mg | ORAL_TABLET | Freq: Every day | ORAL | Status: DC
  Administered 2017-03-31 – 2017-04-02 (×3): 900 mg via ORAL
  Filled 2017-03-31 (×3): qty 2

## 2017-03-31 MED ORDER — LORAZEPAM 1 MG PO TABS
1.0000 mg | ORAL_TABLET | Freq: Two times a day (BID) | ORAL | Status: DC | PRN
  Administered 2017-03-31 – 2017-04-02 (×4): 1 mg via ORAL
  Filled 2017-03-31 (×4): qty 1

## 2017-03-31 MED ORDER — CALCIUM CARBONATE ANTACID 500 MG PO CHEW
200.0000 mg | CHEWABLE_TABLET | Freq: Two times a day (BID) | ORAL | Status: DC
  Administered 2017-03-31 – 2017-04-03 (×6): 200 mg via ORAL
  Filled 2017-03-31 (×7): qty 1

## 2017-03-31 MED ORDER — LOPERAMIDE HCL 2 MG PO CAPS: 2.0000 mg | ORAL_CAPSULE | ORAL | Status: DC | PRN

## 2017-03-31 NOTE — ED Notes (Signed)
Patient transported to X-ray 

## 2017-03-31 NOTE — ED Notes (Signed)
Attempted X2 to get [employer]

## 2017-03-31 NOTE — ED Notes (Signed)
States she is feeling better , nausea is better.

## 2017-03-31 NOTE — Consult Note (Signed)
Cardiology Consult    Patient ID: [name] [name] MRN: [id number], DOB/AGE: 07-21-1961   Admit date: 03/31/2017 Date of Consult: 03/31/2017  Primary Physician: [name] Melter, MD Primary Cardiologist: [name] [name] Reason for Consultation: Chest pain, palpitations, elevated trop  [name] [name] is a 56 y.o. female who is being seen today for the evaluation of chest pain, elevated troponin at the request of Dr. Christy [name].   Patient Profile    56 yo female with PMH of Brain and Lung Ca, hypothyroidism, TMJ, and anxiety who presented with chest pressure, n/v/d, and palpitations.   Past Medical History   Past Medical History:  Diagnosis Date  . Anxiety   . Brain cancer [facility])    awaiting pathology; assuming this is a lung primary with brain mets  . Dementia   . GERD (gastroesophageal reflux disease)   . Hypothyroidism   . Left homonymous hemianopsia    secondary to right posterior temporal and occipital lesions.  . Lung cancer [facility])    awaiting pathology  . Thyroid disease   . TMJ (dislocation of temporomandibular joint)     Past Surgical History:  Procedure Laterality Date  . APPENDECTOMY    . LUMBAR WOUND DEBRIDEMENT N/A 12/05/2013   Procedure: irrigation and debridement posterior cervical wound;  Surgeon: Eustace Moore, MD;  Location: [place] NEURO ORS;  Service: Neurosurgery;  Laterality: N/A;  . SUBOCCIPITAL CRANIECTOMY CERVICAL LAMINECTOMY N/A 11/19/2013   Procedure: SUBOCCIPITAL CRANIECTOMY for tumor;  Surgeon: Eustace Moore, MD;  Location: [place] NEURO ORS;  Service: Neurosurgery;  Laterality: N/A;  . TONSILLECTOMY    . VIDEO ASSISTED THORACOSCOPY (VATS)/WEDGE RESECTION Left 03/05/2014   Procedure: VIDEO ASSISTED THORACOSCOPY (VATS)/WEDGE RESECTION;  Surgeon: Melrose Nakayama, MD;  Location: [place];  Service: Thoracic;  Laterality: Left;     Allergies  Allergies  Allergen Reactions  . Morphine And Related Nausea Only  . Scopolamine Rash     History of Present Illness    [name] [name] is a 56 yo female with PMH of  Brain and Lung Ca, hypothyroidism, TMJ, and anxiety. Reports she was first dx with brain Ca at [facility] back in 2014 after presenting with nausea, vomiting and headache. Underwent a craniotomy there, then developed lung Ca with wedge resection in 4/15. Has been followed on a routine basis since that time. States she was placed on Ketrudia, which affected her thyroid and has struggled to find the right dosage of synthroid, and is followed by Endocrinology. She was seen at [facility] 2 days prior with nausea, vomiting and headache. Was given IVFs, nausea medications and had a CT scan that was normal. She was discharged home. Yesterday she continued to feel bad, went to her PCP who Rx Zofran. States she has continued to have nausea. This morning she woke up and felt like her heart was pounding in her chest. Took her HR and noted it at 130, with a blood pressure of 150/107. States over the past couple of days her blood pressure has also been elevated, which is unusual for her as she takes hydrocortisone to help with hypotension. Did have some centralized chest pressure. States pain in her jaws as well, but also has a hx of TMJ so was difficult to discern.   In the ED labs showed K+ 2.8, Hgb 13.0, Trop 0.09, WBC 13.6. CXR was negative. EKG shows SR with no acute ST/T wave changes. She was given 2 SL nitro now without chest pain. Still reporting some nausea.  Inpatient Medications   No current facility-administered medications for this encounter.   Current Outpatient Prescriptions:  .  gabapentin (NEURONTIN) 600 MG tablet, Take 900 mg by mouth at bedtime. , Disp: , Rfl:  .  hydrocortisone (CORTEF) 5 MG tablet, Take 10 mg in am and 5 mg in pm., Disp: , Rfl:  .  levothyroxine (SYNTHROID, LEVOTHROID) 100 MCG tablet, Take 100 mcg by mouth daily before breakfast. , Disp: , Rfl:  .  LORazepam (ATIVAN) 1 MG tablet, Take 1 tablet (1 mg total) by  mouth every 12 (twelve) hours as needed for anxiety or sleep., Disp: 60 tablet, Rfl: 0 .  mirtazapine (REMERON) 30 MG tablet, Take 30 mg by mouth at bedtime., Disp: , Rfl:  .  promethazine (PHENERGAN) 25 MG suppository, Place 1 suppository (25 mg total) rectally every 6 (six) hours as needed for nausea or vomiting., Disp: 12 each, Rfl: 0 .  traMADol (ULTRAM) 50 MG tablet, Take 1 tablet (50 mg total) by mouth every 12 (twelve) hours as needed for moderate pain. (Patient not taking: Reported on 03/31/2017), Disp: 50 tablet, Rfl: 0  Family History    Family History  Problem Relation Age of Onset  . Dementia Mother   . Cancer - Other Other     Social History    Social History   Social History  . Marital status: Married    Spouse name: N/A  . Number of children: N/A  . Years of education: N/A   Occupational History  . Not on file.   Social History Main Topics  . Smoking status: Former Smoker    Quit date: 11/19/2013  . Smokeless tobacco: Never Used  . Alcohol use No  . Drug use: No  . Sexual activity: Not on file   Other Topics Concern  . Not on file   Social History Narrative  . No narrative on file     Review of Systems    General:  No chills, fever, night sweats or weight changes.  Cardiovascular:  See HPI Dermatological: No rash, lesions/masses Respiratory: No cough, dyspnea Urologic: No hematuria, dysuria Abdominal:   See HPI Neurologic:  No visual changes, wkns, changes in mental status. All other systems reviewed and are otherwise negative except as noted above.  Physical Exam    Blood pressure 134/86, pulse 88, temperature 98.9 F (37.2 C), temperature source Oral, resp. rate 12, SpO2 99 %.  General: Pleasant, NAD Psych: Normal affect. Neuro: Alert and oriented X 3. Moves all extremities spontaneously. HEENT: Normal  Neck: Supple without bruits or JVD. Lungs:  Resp regular and unlabored, CTA. Heart: tachy no s3, s4, or murmurs. Abdomen: Soft,  non-tender, non-distended, BS + x 4.  Extremities: No clubbing, cyanosis or edema. DP/PT/Radials 2+ and equal bilaterally.  Labs    Troponin [facility])  Recent Labs  03/31/17 0400  TROPIPOC 0.05    Recent Labs  03/31/17 0533  TROPONINI 0.09*   Lab Results  Component Value Date   WBC 13.6 (H) 03/31/2017   HGB 13.0 03/31/2017   HCT 39.0 03/31/2017   MCV 83.9 03/31/2017   PLT 323 03/31/2017    Recent Labs Lab 03/31/17 0341  NA 135  K 2.8*  CL 102  CO2 21*  BUN 7  CREATININE 0.81  CALCIUM 9.3  GLUCOSE 109*   No results found for: CHOL, HDL, LDLCALC, TRIG No results found for: [facility]   Radiology Studies    Dg Chest 2 View  Result Date:  03/31/2017 CLINICAL DATA:  Chest pain and dyspnea tonight EXAM: CHEST  2 VIEW COMPARISON:  03/25/2014 FINDINGS: Prior resection in the left lung. The lungs are otherwise clear. The pulmonary vasculature is normal. Heart size is normal. Hilar and mediastinal contours are unremarkable. There is no pleural effusion. IMPRESSION: No active cardiopulmonary disease. Electronically Signed   By: Andreas Newport M.D.   On: 03/31/2017 04:26    ECG & Cardiac Imaging    EKG: SR without acute ST/ T wave changes   Assessment & Plan    56 yo female with PMH of Brain and Lung Ca, hypothyroidism, TMJ, and anxiety who presented with chest pressure, n/v/d, and palpitations.   1. Elevated Trop: Reports multiple days of nausea, vomiting and diarrhea. Was seen at [facility] and her PCP office for the same. This morning felt like her heart was racing, checked it HR 130, Blood pressure 150/107. Had some chest pressure with this. Denies any family hx of CAD. States she smoked until about 4 years ago when first dx with Ca. POC Trop 0.05, Trop I 0.09. Suspect this may be due to her tachycardia and elevated blood pressures.  -- continue to cycle enzymes -- check echo, consider stress test as she does have CRF of tobacco use  2. N/V/D: states this has  continued since being seen at [facility]. Still having intermittent nausea. WBC mildly elevated at 13.  3. Brain/Lung Ca: s/p craniotomy and wedge resection in 2014/2015. Followed by oncology and palliative care at [facility].   4. Hypothyroidism: Has been struggling to find correct dosage for her synthroid. Followed by endocrinology. Question whether this could be contributing to her symptoms.   Barnet Pall, NP-C Pager [phone] 03/31/2017, 7:53 AM   I have personally seen and examined this patient with Reino Bellis, NP. I agree with the assessment and plan as outlined above. She is admitted with N/V/diarrhea for days with weakness. She has had mild chest pressure which is now resolved. She reports tachycardia at home with HR of 113. Felt her heart pounding. Troponin 0.9.  Me exam shows a thin female in NAD. Neck: no JVD. CV: RRR with no murmurs. Pulm:lungs clear bilaterally. Abd: soft, NT, ND. Ext: no edema Labs reviewed by me. Troponin 0.09. I have personally reviewed the EKG and it shows sinus rhythm with no ischemic changes. Plan: I do not think her presentation represents acute coronary syndrome. I think she has an active GI illness with elevated troponin likely due to demand ischemia. She should probably be admitted to the IM service for hydration and nausea control. We will cycle troponin, arrange an echo to assess LVEF, follow on telemetry. An ischemic evaluation will likely not be indicated.   Lauree Chandler 03/31/2017 10:09 AM

## 2017-03-31 NOTE — ED Notes (Signed)
Admitting PA at bedside.

## 2017-03-31 NOTE — ED Notes (Signed)
Attempted to call report

## 2017-03-31 NOTE — ED Notes (Signed)
Admitting paged re: active chest pain & telemetry bed request placed, Order for central line potassium and pt does not have central line, will discuss with ordering provider

## 2017-03-31 NOTE — ED Notes (Signed)
Patient returned from xray.

## 2017-03-31 NOTE — ED Triage Notes (Signed)
Per EMS pt from home, 1 hr ago chest pressure sternal radiated to back, 8/10 and primary care seen for nausea give phenergan and zofran 324 aspirin, 2 nitro pain 2/10. H/s brain cancer 2015

## 2017-03-31 NOTE — Progress Notes (Signed)
Pt came up to the floor from the ed. Pt oriented to the unit. Call light in reach will cont to monitor pt.

## 2017-03-31 NOTE — Progress Notes (Signed)
Pt c/o of CP that comes and goes. Ekg obtained. PRN Ativan given. PA on call made aware. Pt also stated she  takes her Cortef ( '5mg'$  ) 3 times a day. Pa aware and new order received to change cortef to ('5mg'$ )3 times a day. Pt currently resting in bed and states her Cp is better right now. Will cont to monitor pt.

## 2017-03-31 NOTE — ED Provider Notes (Signed)
[facility] DEPT Provider Note   CSN: [id number] Arrival date & time: 03/31/17  0301   By signing my name below, I, [name] [name], attest that this documentation has been prepared under the direction and in the presence of [name] Fraise, MD. Electronically Signed: Charolotte [name], Scribe. 03/31/17. 3:39 AM.    History   Chief Complaint Chief Complaint  Patient presents with  . Chest Pain    HPI [name] [name] is a 56 y.o. female.  Pt has h/o metastatic Brain CA s/p craniotomy. Pt was at [facility] 2 days ago Pt was given NTG in ED. Pt has never had MI or VTE previously.  Pt denies taking medications for DM or HTN. No family h/o cardiac issues. Pt is allergic to Morphine  and Scopolamine.   The history is provided by the patient. No language interpreter was used.  Chest Pain   This is a new problem. Episode onset: several hours ago. The problem has been gradually improving. The pain is at a severity of 5/10. The pain is moderate. Quality: Pounding. The pain radiates to the mid back, upper back, lower back, left jaw and right jaw. Associated symptoms include headaches and nausea. Risk factors include post-menopausal.  Her past medical history is significant for cancer.  Pertinent negatives for past medical history include no CAD, no MI and no PE. Past medical history comments: brain CA  Pertinent negatives for family medical history include: no early MI.    Past Medical History:  Diagnosis Date  . Anxiety   . Brain cancer [facility])    awaiting pathology; assuming this is a lung primary with brain mets  . Dementia   . GERD (gastroesophageal reflux disease)   . Hypothyroidism   . Left homonymous hemianopsia    secondary to right posterior temporal and occipital lesions.  . Lung cancer [facility])    awaiting pathology  . Thyroid disease   . TMJ (dislocation of temporomandibular joint)     Patient Active Problem List   Diagnosis Date Noted  . Metastatic melanoma of brain  10/14/2015  . Left homonymous hemianopsia 06/09/2015  . Chronic daily headache 06/09/2015  . Abnormal brain MRI 04/06/2015  . Awareness alteration, transient 04/06/2015  . Other headache syndrome   . Altered mental status 03/21/2015  . Acute encephalopathy 03/21/2015  . GERD (gastroesophageal reflux disease) 03/21/2015  . Anxiety 03/21/2015  . Dementia 03/21/2015  . Polypharmacy 03/21/2015  . Vomiting 04/30/2014  . Nausea vomiting and diarrhea 04/30/2014  . Hyperglycemia 04/30/2014  . Metastatic melanoma to lung ([place]) 03/25/2014  . Protein-calorie malnutrition, severe ([place]) 03/19/2014  . Colitis 03/17/2014  . Candidiasis of mouth 12/16/2013  . Wound infection after surgery 12/05/2013  . Solitary brain metastasis from melanoma 12/03/2013  . S/P craniotomy 11/19/2013  . Lung nodule 11/16/2013  . Cerebellar mass 11/14/2013  . Hypothyroid 11/14/2013  . Tobacco abuse 11/14/2013    Past Surgical History:  Procedure Laterality Date  . APPENDECTOMY    . LUMBAR WOUND DEBRIDEMENT N/A 12/05/2013   Procedure: irrigation and debridement posterior cervical wound;  Surgeon: Eustace Moore, MD;  Location: [place] NEURO ORS;  Service: Neurosurgery;  Laterality: N/A;  . SUBOCCIPITAL CRANIECTOMY CERVICAL LAMINECTOMY N/A 11/19/2013   Procedure: SUBOCCIPITAL CRANIECTOMY for tumor;  Surgeon: Eustace Moore, MD;  Location: [place] NEURO ORS;  Service: Neurosurgery;  Laterality: N/A;  . TONSILLECTOMY    . VIDEO ASSISTED THORACOSCOPY (VATS)/WEDGE RESECTION Left 03/05/2014   Procedure: VIDEO ASSISTED THORACOSCOPY (VATS)/WEDGE RESECTION;  Surgeon: Remo Lipps  Chaya Jan, MD;  Location: [place];  Service: Thoracic;  Laterality: Left;    OB History    No data available       Home Medications    Prior to Admission medications   Medication Sig Start Date End Date Taking? Authorizing Provider  citalopram (CELEXA) 20 MG tablet Take by mouth. 07/14/14   Historical Provider, MD  esomeprazole (NEXIUM) 20 MG capsule Take  20 mg by mouth daily at 12 noon.    Historical Provider, MD  gabapentin (NEURONTIN) 600 MG tablet Take 900 mg by mouth at bedtime.     Historical Provider, MD  hydrocortisone (CORTEF) 5 MG tablet Take 10 mg in am and 5 mg in pm. Sick day rules: 20 mg in am and 10 mg in pm 09/09/15   Historical Provider, MD  levothyroxine (SYNTHROID, LEVOTHROID) 100 MCG tablet Take by mouth. 10/06/16 10/06/17  Historical Provider, MD  LORazepam (ATIVAN) 1 MG tablet Take 1 tablet (1 mg total) by mouth every 12 (twelve) hours as needed for anxiety or sleep. 05/03/14   Robbie Lis, MD  oxyCODONE-acetaminophen (PERCOCET/ROXICET) 5-325 MG per tablet Take 1 tablet by mouth every 4 (four) hours as needed for severe pain. Reported on 03/29/2016    Historical Provider, MD  promethazine (PHENERGAN) 25 MG suppository Place 1 suppository (25 mg total) rectally every 6 (six) hours as needed for nausea or vomiting. 07/10/15   Alfonzo Beers, MD  traMADol (ULTRAM) 50 MG tablet Take 1 tablet (50 mg total) by mouth every 12 (twelve) hours as needed for moderate pain. 12/01/14   Tyler Pita, MD    Family History Family History  Problem Relation Age of Onset  . Dementia Mother   . Cancer - Other Other     Social History Social History  Substance Use Topics  . Smoking status: Former Smoker    Quit date: 11/19/2013  . Smokeless tobacco: Never Used  . Alcohol use No     Allergies   Morphine and related and Scopolamine   Review of Systems Review of Systems  Cardiovascular: Positive for chest pain.  Gastrointestinal: Positive for nausea.  Neurological: Positive for headaches.  All other systems reviewed and are negative.    Physical Exam Updated Vital Signs BP 104/86 (BP Location: Right Arm)   Pulse (!) 121   Temp 98.9 F (37.2 C) (Oral)   Resp 13   SpO2 99%   Physical Exam CONSTITUTIONAL: Anxious but no acute distress HEAD: Normocephalic/atraumatic EYES: EOMI/PERRL ENMT: Mucous membranes moist NECK: supple  no meningeal signs SPINE/BACK:entire spine nontender CV: S1/S2 noted, no murmurs/rubs/gallops noted LUNGS: Lungs are clear to auscultation bilaterally, no apparent distress ABDOMEN: soft, nontender, no rebound or guarding, bowel sounds noted throughout abdomen GU:no cva tenderness NEURO: Pt is awake/alert/appropriate, moves all extremitiesx4.  No facial droop.   EXTREMITIES: pulses normal/equal, full ROM, no calf tenderness or edema SKIN: warm, color normal PSYCH: anxious   ED Treatments / Results   DIAGNOSTIC STUDIES: Oxygen Saturation is 99% on room air, normal by my interpretation.    COORDINATION OF CARE: 3:36 AM Discussed treatment plan with pt at bedside and pt agreed to plan, which includes nausea medication.    Labs (all labs ordered are listed, but only abnormal results are displayed) Labs Reviewed  BASIC METABOLIC PANEL - Abnormal; Notable for the following:       Result Value   Potassium 2.8 (*)    CO2 21 (*)    Glucose, Bld 109 (*)  All other components within normal limits  CBC WITH DIFFERENTIAL/PLATELET - Abnormal; Notable for the following:    WBC 13.6 (*)    Neutro Abs 9.2 (*)    Monocytes Absolute 1.5 (*)    All other components within normal limits  TROPONIN I - Abnormal; Notable for the following:    Troponin I 0.09 (*)    All other components within normal limits  I-STAT TROPOININ, ED    EKG  EKG Interpretation  Date/Time:  Friday Mar 31 2017 03:14:17 EDT Ventricular Rate:  118 PR Interval:    QRS Duration: 93 QT Interval:  340 QTC Calculation: 477 R Axis:   72 Text Interpretation:  Sinus tachycardia Right atrial enlargement Minimal ST depression, inferior leads No significant change since last tracing Confirmed by [name] [name] [phone]) on 03/31/2017 3:24:11 AM       EKG Interpretation  Date/Time:  Friday Mar 31 2017 05:20:41 EDT Ventricular Rate:  86 PR Interval:    QRS Duration: 79 QT Interval:  403 QTC Calculation: 482 R  Axis:   77 Text Interpretation:  Sinus rhythm Borderline repolarization abnormality Confirmed by [name] [name] [phone]) on 03/31/2017 5:27:24 AM       Radiology Dg Chest 2 View  Result Date: 03/31/2017 CLINICAL DATA:  Chest pain and dyspnea tonight EXAM: CHEST  2 VIEW COMPARISON:  03/25/2014 FINDINGS: Prior resection in the left lung. The lungs are otherwise clear. The pulmonary vasculature is normal. Heart size is normal. Hilar and mediastinal contours are unremarkable. There is no pleural effusion. IMPRESSION: No active cardiopulmonary disease. Electronically Signed   By: Andreas Newport M.D.   On: 03/31/2017 04:26    Procedures Procedures   Medications Ordered in ED Medications  sodium chloride 0.9 % bolus 1,000 mL (0 mLs Intravenous Stopped 03/31/17 0636)  ondansetron (ZOFRAN) injection 4 mg (4 mg Intravenous Given 03/31/17 0441)  potassium chloride SA (K-DUR,KLOR-CON) CR tablet 40 mEq (40 mEq Oral Given 03/31/17 0521)     Initial Impression / Assessment and Plan / ED Course  I have reviewed the triage vital signs and the nursing notes.  Pertinent labs & imaging results that were available during my care of the patient were reviewed by me and considered in my medical decision making (see chart for details).     5:01 AM Pt low risk for ACS, but did have significant chest pressure She has already received ASA/NTG She reports NTG improved her chest pressure Of note, recently seen at [facility] for HA (negative CT head) and also had vomiting/diarrhea The CP started tonight while at home 5:27 AM Pt improved HR improved on repeat ekg Repeat troponin ordered 6:54 AM Pt reports chest pressure improved but still has nausea Troponin elevated but could have been related to tachycardia earlier that resolved No pleuritic CP reported to suggest PE D/w cardiology PA to evaluate for admission  Final Clinical Impressions(s) / ED Diagnoses   Final diagnoses:  Unstable angina ([place])   Hypokalemia    New Prescriptions New Prescriptions   No medications on file   I personally performed the services described in this documentation, which was scribed in my presence. The recorded information has been reviewed and is accurate.        [name] Fraise, MD 03/31/17 [phone]

## 2017-03-31 NOTE — ED Triage Notes (Signed)
Patient with chest pain and nausea that started around 0130.  Patient states that she has been having a lot of belching for the last few hours.  Patient was given '324mg'$  ASA and 2 SL nitro and Zofran en route to ED.  Patient is tachycardic on arrival.

## 2017-03-31 NOTE — H&P (Signed)
History and Physical    [name] [name]:[id number] DOB: 1961-09-09 DOA: 03/31/2017   PCP: Orpah Melter, MD   Patient coming from:  Home    Chief Complaint: Chest pain, nausea, vomiting and diarrhea   HPI: [name] [name] is a 56 y.o. female  With a history of metastatic melanoma to the  brain s/p XRT and Keytruda and s/p craniotomy followed at [facility] initially diagnosed in 2014, history of lung Ca s/p Wedge resection in April 2015,  Keytruda induced  hypothyroidism,  Hypotension on hydrocortisone, anxiety, TMJ, presenting with 3 day history of nausea, vomiting, headaches. She was priorly seen at [facility] with same symptoms with negative head CT, given IVF and antiemetics and discharged to home.  Symptoms did not improve for which she sought medical attention at  Her PCP who prescribed oral Zofran . She is now presenting to [facility] with left sided chest pain radiating to the back, accompanied by palpitations, tachycardia, with HR 130s and hypertensive with BP 150/107.  SHe had not taken her morning meds. No syncope or presyncope. She also reports liquid yellow stools, non odorous, not mucoid, 4 episodes in the ER, with abdominal cramping. Her vomiting had almost resolved. Denies sick contacts, food poisoning or recent long distance trips. She feels very weak, but no confusion was noted. No falls. Denies fevers, chills, night sweats, vision changes, or mucositis. Denies any respiratory complaints.  Denies lower extremity swelling. Appetite is decreased. Denies any dysuria. Denies abnormal skin rashes  Denies any bleeding issues such as epistaxis, hematemesis, hematuria or hematochezia.     ED Course:  BP 119/84   Pulse 95   Temp 98.9 F (37.2 C) (Oral)   Resp 15   SpO2 99%    potassium 2.8 after 40 mEq of KCl oral hemoglobin 13 white count 13.6 troponin 0.09 EKG sinus rhythm without acute ST or TW changes received 2 sublingual nitroglycerin in, currently chest pain free received 1 L  fluid IV cardiology consultation was obtained, suspecting symptoms to be of G.I. source in the setting of dehydration among but they are monitoring any changes in her troponin's, EKG and symptoms. A CS is not suspected. Cycle troponin's, and 2D echo to assess LV ejection fraction is in progress. Per cardiology report, ischemic evaluation will likely not be indicated.  Review of Systems: As per HPI otherwise 10 point review of systems negative.   Past Medical History:  Diagnosis Date  . Anxiety   . Brain cancer [facility])    awaiting pathology; assuming this is a lung primary with brain mets  . Dementia   . GERD (gastroesophageal reflux disease)   . Hypothyroidism   . Left homonymous hemianopsia    secondary to right posterior temporal and occipital lesions.  . Lung cancer [facility])    awaiting pathology  . Thyroid disease   . TMJ (dislocation of temporomandibular joint)     Past Surgical History:  Procedure Laterality Date  . APPENDECTOMY    . LUMBAR WOUND DEBRIDEMENT N/A 12/05/2013   Procedure: irrigation and debridement posterior cervical wound;  Surgeon: Eustace Moore, MD;  Location: [place] NEURO ORS;  Service: Neurosurgery;  Laterality: N/A;  . SUBOCCIPITAL CRANIECTOMY CERVICAL LAMINECTOMY N/A 11/19/2013   Procedure: SUBOCCIPITAL CRANIECTOMY for tumor;  Surgeon: Eustace Moore, MD;  Location: [place] NEURO ORS;  Service: Neurosurgery;  Laterality: N/A;  . TONSILLECTOMY    . VIDEO ASSISTED THORACOSCOPY (VATS)/WEDGE RESECTION Left 03/05/2014   Procedure: VIDEO ASSISTED THORACOSCOPY (VATS)/WEDGE RESECTION;  Surgeon:  Melrose Nakayama, MD;  Location: [place];  Service: Thoracic;  Laterality: Left;    Social History Social History   Social History  . Marital status: Married    Spouse name: N/A  . Number of children: N/A  . Years of education: N/A   Occupational History  . Not on file.   Social History Main Topics  . Smoking status: Former Smoker    Quit date: 11/19/2013  . Smokeless tobacco:  Never Used  . Alcohol use No  . Drug use: No  . Sexual activity: Not on file   Other Topics Concern  . Not on file   Social History Narrative  . No narrative on file     Allergies  Allergen Reactions  . Morphine And Related Nausea Only  . Scopolamine Rash    Family History  Problem Relation Age of Onset  . Dementia Mother   . Cancer - Other Other       Prior to Admission medications   Medication Sig Start Date End Date Taking? Authorizing Provider  gabapentin (NEURONTIN) 600 MG tablet Take 900 mg by mouth at bedtime.    Yes Historical Provider, MD  hydrocortisone (CORTEF) 5 MG tablet Take 10 mg in am and 5 mg in pm. 09/09/15  Yes Historical Provider, MD  levothyroxine (SYNTHROID, LEVOTHROID) 100 MCG tablet Take 100 mcg by mouth daily before breakfast.  10/06/16 10/06/17 Yes Historical Provider, MD  LORazepam (ATIVAN) 1 MG tablet Take 1 tablet (1 mg total) by mouth every 12 (twelve) hours as needed for anxiety or sleep. 05/03/14  Yes Robbie Lis, MD  mirtazapine (REMERON) 30 MG tablet Take 30 mg by mouth at bedtime. 01/26/17  Yes Historical Provider, MD  promethazine (PHENERGAN) 25 MG suppository Place 1 suppository (25 mg total) rectally every 6 (six) hours as needed for nausea or vomiting. 07/10/15  Yes Alfonzo Beers, MD  traMADol (ULTRAM) 50 MG tablet Take 1 tablet (50 mg total) by mouth every 12 (twelve) hours as needed for moderate pain. Patient not taking: Reported on 03/31/2017 12/01/14   Tyler Pita, MD    Physical Exam:  Vitals:   03/31/17 0900 03/31/17 0915 03/31/17 0930 03/31/17 1000  BP: 119/84     Pulse: 93 (!) 102 89 95  Resp: '13 15 19 15  '$ Temp:      TempSrc:      SpO2: 99% 99% 98% 99%   Constitutional: NAD, calm, uncomfortable due to GI symptoms  Eyes: PERRL, lids and conjunctivae normal ENMT: Mucous membranes are moist, without exudate or lesions  Neck: normal, supple, no masses, no thyromegaly Respiratory: clear to auscultation bilaterally, no wheezing,  no crackles. Normal respiratory effort  Cardiovascular: Regular rate and rhythm, no murmurs, rubs or gallops. No extremity edema. 2+ pedal pulses. No carotid bruits.  Abdomen: Soft, non tender, active bowel sounds  No hepatosplenomegaly. Bowel sounds positive.  Musculoskeletal: no clubbing / cyanosis. Moves all extremities Skin: no jaundice, No lesions.  Neurologic: Sensation intact  Strength equal in all extremities. Known hemianopsia. Gait not tested Psychiatric:   Alert and oriented x 3. Mildly anxious  mood.     Labs on Admission: I have personally reviewed following labs and imaging studies  CBC:  Recent Labs Lab 03/31/17 0341  WBC 13.6*  NEUTROABS 9.2*  HGB 13.0  HCT 39.0  MCV 83.9  PLT 852    Basic Metabolic Panel:  Recent Labs Lab 03/31/17 0341  NA 135  K 2.8*  CL 102  CO2 21*  GLUCOSE 109*  BUN 7  CREATININE 0.81  CALCIUM 9.3    GFR: CrCl cannot be calculated (Unknown ideal weight.).  Liver Function Tests: No results for input(s): AST, ALT, ALKPHOS, BILITOT, PROT, ALBUMIN in the last 168 hours. No results for input(s): LIPASE, AMYLASE in the last 168 hours. No results for input(s): AMMONIA in the last 168 hours.  Coagulation Profile: No results for input(s): INR, PROTIME in the last 168 hours.  Cardiac Enzymes:  Recent Labs Lab 03/31/17 0533  TROPONINI 0.09*    BNP (last 3 results) No results for input(s): PROBNP in the last 8760 hours.  HbA1C: No results for input(s): HGBA1C in the last 72 hours.  CBG: No results for input(s): GLUCAP in the last 168 hours.  Lipid Profile: No results for input(s): CHOL, HDL, LDLCALC, TRIG, CHOLHDL, LDLDIRECT in the last 72 hours.  Thyroid Function Tests: No results for input(s): TSH, T4TOTAL, FREET4, T3FREE, THYROIDAB in the last 72 hours.  Anemia Panel: No results for input(s): VITAMINB12, FOLATE, FERRITIN, TIBC, IRON, RETICCTPCT in the last 72 hours.  Urine analysis:    Component Value  Date/Time   COLORURINE YELLOW 07/10/2015 1358   APPEARANCEUR HAZY (A) 07/10/2015 1358   LABSPEC 1.005 07/10/2015 1358   PHURINE 5.5 07/10/2015 1358   GLUCOSEU NEGATIVE 07/10/2015 1358   HGBUR NEGATIVE 07/10/2015 1358   BILIRUBINUR NEGATIVE 07/10/2015 1358   KETONESUR 40 (A) 07/10/2015 1358   PROTEINUR NEGATIVE 07/10/2015 1358   UROBILINOGEN 0.2 07/10/2015 1358   NITRITE NEGATIVE 07/10/2015 1358   LEUKOCYTESUR SMALL (A) 07/10/2015 1358    Sepsis Labs: '@LABRCNTIP'$ (procalcitonin:4,lacticidven:4) )No results found for this or any previous visit (from the past 240 hour(s)).   Radiological Exams on Admission: Dg Chest 2 View  Result Date: 03/31/2017 CLINICAL DATA:  Chest pain and dyspnea tonight EXAM: CHEST  2 VIEW COMPARISON:  03/25/2014 FINDINGS: Prior resection in the left lung. The lungs are otherwise clear. The pulmonary vasculature is normal. Heart size is normal. Hilar and mediastinal contours are unremarkable. There is no pleural effusion. IMPRESSION: No active cardiopulmonary disease. Electronically Signed   By: Andreas Newport M.D.   On: 03/31/2017 04:26    EKG: Independently reviewed.  Assessment/Plan Active Problems:   Vomiting   Elevated troponin   Cerebellar mass   Hypothyroid   Tobacco abuse   S/P craniotomy   Colitis   Protein-calorie malnutrition, severe (HCC)   Metastatic melanoma to lung (HCC)   GERD (gastroesophageal reflux disease)   Anxiety   Dementia   Left homonymous hemianopsia   Secondary malignant melanoma of brain (HCC)   Chest pain syndrome, cardiac versus  vs anxietyversus GI (history of GERD)/ volume loss Troponin 0.09  , EKG without evidence of ACS. CP relieved by nitroglycerin, CXR unrevealing. 2 D echo pending . Risk factors include  prior lung Ca, prior history of tobacco. Was tachycardic on admission, now resolved after hydration. Cardiology following   Admit to Telemetry/ Observation Chest pain order set Cycle troponins EKG in  am continue ASA, O2 and NTG as needed Appreciate Cards follow up.      Nausea, vomiting and Diarrhea, rule out viral source. Prior history of colitis; History of GERD    Afebrile. VSS. WBC 13.6  Continues to have liquid, non bloody  stools since admission. Currently emesis is controlled.  IVF with K replenishment Antiemetics, Maalox and Simethicone- unable to give GI cocktail as she is allergic to scopolamine, per pharmacy recommendation)  Enteric precautions   Stool  culture for ova and parasites   Clear liquid diet and advance as tolerated  Start imodium    Add Probiotics GI consult if symptoms do not resolve  Leukocytosis, likely reactive WBC 13.6  CXR  NAD afebrile   IVF  Blood cultures   Repeat CBC in AM   Hypokalemia, may be due to volume loss. EKG SR . Current K 2.8 Received 40 meq oral at the ED  KCL 10 meq x 4 runs  Check Mg Repeat CMET in am  Hypothyroidism, Keytruda induced: Continue home Synthroid    History of Hypotension on Hydrocortisone bid. Initially she was highly hypertensive with BP reaching 150/107   Current BP 130/88   Pulse 94  No pneumothorax on CXR. Resume Hydrocortisone  dose now.  Echocardiogram ordered Check ECG  Anxiety Continue home meds  History of malignant melanoma to the brain s/p resection and  XRT, latest therapy with Keytruda and History of Lung Cancer s/p Radiation and VATS. NO acute issues at this time Follow up at [facility]  Continue Neurontin for neuropathy  DVT prophylaxis: Lovenox   Code Status:   Full     Family Communication:  Discussed with patient Disposition Plan: Expect patient to be discharged to home after condition improves Consults called:    Cardiology Admission status:Tele   Obs   [facility] E, PA-C [facility]   03/31/2017, 10:22 AM

## 2017-04-01 DIAGNOSIS — E43 Unspecified severe protein-calorie malnutrition: Secondary | ICD-10-CM | POA: Diagnosis present

## 2017-04-01 DIAGNOSIS — R0789 Other chest pain: Secondary | ICD-10-CM

## 2017-04-01 DIAGNOSIS — E039 Hypothyroidism, unspecified: Secondary | ICD-10-CM

## 2017-04-01 DIAGNOSIS — C7931 Secondary malignant neoplasm of brain: Secondary | ICD-10-CM | POA: Diagnosis present

## 2017-04-01 DIAGNOSIS — I959 Hypotension, unspecified: Secondary | ICD-10-CM | POA: Diagnosis present

## 2017-04-01 DIAGNOSIS — Z923 Personal history of irradiation: Secondary | ICD-10-CM | POA: Diagnosis not present

## 2017-04-01 DIAGNOSIS — K219 Gastro-esophageal reflux disease without esophagitis: Secondary | ICD-10-CM | POA: Diagnosis present

## 2017-04-01 DIAGNOSIS — E876 Hypokalemia: Secondary | ICD-10-CM | POA: Diagnosis present

## 2017-04-01 DIAGNOSIS — R000 Tachycardia, unspecified: Secondary | ICD-10-CM | POA: Diagnosis present

## 2017-04-01 DIAGNOSIS — Z681 Body mass index (BMI) 19 or less, adult: Secondary | ICD-10-CM | POA: Diagnosis not present

## 2017-04-01 DIAGNOSIS — Z85118 Personal history of other malignant neoplasm of bronchus and lung: Secondary | ICD-10-CM | POA: Diagnosis not present

## 2017-04-01 DIAGNOSIS — I10 Essential (primary) hypertension: Secondary | ICD-10-CM | POA: Diagnosis present

## 2017-04-01 DIAGNOSIS — E2740 Unspecified adrenocortical insufficiency: Secondary | ICD-10-CM | POA: Diagnosis present

## 2017-04-01 DIAGNOSIS — Z85820 Personal history of malignant melanoma of skin: Secondary | ICD-10-CM | POA: Diagnosis not present

## 2017-04-01 DIAGNOSIS — R197 Diarrhea, unspecified: Secondary | ICD-10-CM | POA: Diagnosis not present

## 2017-04-01 DIAGNOSIS — F419 Anxiety disorder, unspecified: Secondary | ICD-10-CM

## 2017-04-01 DIAGNOSIS — R079 Chest pain, unspecified: Secondary | ICD-10-CM | POA: Diagnosis present

## 2017-04-01 DIAGNOSIS — T451X5S Adverse effect of antineoplastic and immunosuppressive drugs, sequela: Secondary | ICD-10-CM | POA: Diagnosis not present

## 2017-04-01 DIAGNOSIS — H53462 Homonymous bilateral field defects, left side: Secondary | ICD-10-CM | POA: Diagnosis present

## 2017-04-01 DIAGNOSIS — F0390 Unspecified dementia without behavioral disturbance: Secondary | ICD-10-CM | POA: Diagnosis present

## 2017-04-01 DIAGNOSIS — E872 Acidosis: Secondary | ICD-10-CM | POA: Diagnosis present

## 2017-04-01 DIAGNOSIS — Z87891 Personal history of nicotine dependence: Secondary | ICD-10-CM | POA: Diagnosis not present

## 2017-04-01 DIAGNOSIS — R002 Palpitations: Secondary | ICD-10-CM | POA: Diagnosis present

## 2017-04-01 DIAGNOSIS — K529 Noninfective gastroenteritis and colitis, unspecified: Secondary | ICD-10-CM | POA: Diagnosis present

## 2017-04-01 DIAGNOSIS — Z9221 Personal history of antineoplastic chemotherapy: Secondary | ICD-10-CM | POA: Diagnosis not present

## 2017-04-01 DIAGNOSIS — R112 Nausea with vomiting, unspecified: Secondary | ICD-10-CM | POA: Diagnosis not present

## 2017-04-01 LAB — COMPREHENSIVE METABOLIC PANEL
ALBUMIN: 3.1 g/dL — AB (ref 3.5–5.0)
ALK PHOS: 88 U/L (ref 38–126)
ALT: 21 U/L (ref 14–54)
ANION GAP: 12 (ref 5–15)
AST: 22 U/L (ref 15–41)
BUN: <5 mg/dL — ABNORMAL LOW (ref 6–20)
CALCIUM: 8.8 mg/dL — AB (ref 8.9–10.3)
CHLORIDE: 106 mmol/L (ref 101–111)
CO2: 17 mmol/L — AB (ref 22–32)
CREATININE: 0.58 mg/dL (ref 0.44–1.00)
Glucose, Bld: 69 mg/dL (ref 65–99)
Potassium: 3.4 mmol/L — ABNORMAL LOW (ref 3.5–5.1)
SODIUM: 135 mmol/L (ref 135–145)
Total Bilirubin: 1.2 mg/dL (ref 0.3–1.2)
Total Protein: 6.1 g/dL — ABNORMAL LOW (ref 6.5–8.1)

## 2017-04-01 LAB — CBC
HCT: 35.6 % — ABNORMAL LOW (ref 36.0–46.0)
HEMOGLOBIN: 11.5 g/dL — AB (ref 12.0–15.0)
MCH: 27.3 pg (ref 26.0–34.0)
MCHC: 32.3 g/dL (ref 30.0–36.0)
MCV: 84.6 fL (ref 78.0–100.0)
PLATELETS: 275 10*3/uL (ref 150–400)
RBC: 4.21 MIL/uL (ref 3.87–5.11)
RDW: 14.9 % (ref 11.5–15.5)
WBC: 8.7 10*3/uL (ref 4.0–10.5)

## 2017-04-01 LAB — D-DIMER, QUANTITATIVE: D-Dimer, Quant: 2.96 ug/mL-FEU — ABNORMAL HIGH (ref 0.00–0.50)

## 2017-04-01 MED ORDER — PANTOPRAZOLE SODIUM 40 MG PO TBEC
40.0000 mg | DELAYED_RELEASE_TABLET | Freq: Two times a day (BID) | ORAL | Status: DC
  Administered 2017-04-01 – 2017-04-03 (×5): 40 mg via ORAL
  Filled 2017-04-01 (×5): qty 1

## 2017-04-01 MED ORDER — POTASSIUM CHLORIDE CRYS ER 20 MEQ PO TBCR
40.0000 meq | EXTENDED_RELEASE_TABLET | Freq: Once | ORAL | Status: AC
  Administered 2017-04-01: 40 meq via ORAL
  Filled 2017-04-01: qty 2

## 2017-04-01 MED ORDER — HYDROCORTISONE 5 MG PO TABS
5.0000 mg | ORAL_TABLET | Freq: Once | ORAL | Status: AC
  Administered 2017-04-01: 5 mg via ORAL
  Filled 2017-04-01 (×2): qty 1

## 2017-04-01 MED ORDER — MAGNESIUM SULFATE 2 GM/50ML IV SOLN
2.0000 g | Freq: Once | INTRAVENOUS | Status: AC
  Administered 2017-04-01: 2 g via INTRAVENOUS
  Filled 2017-04-01: qty 50

## 2017-04-01 MED ORDER — LEVOTHYROXINE SODIUM 88 MCG PO TABS
88.0000 ug | ORAL_TABLET | Freq: Every day | ORAL | Status: DC
  Administered 2017-04-02 – 2017-04-03 (×2): 88 ug via ORAL
  Filled 2017-04-01 (×2): qty 1

## 2017-04-01 MED ORDER — HYDROCORTISONE 5 MG PO TABS
5.0000 mg | ORAL_TABLET | Freq: Three times a day (TID) | ORAL | Status: DC
  Administered 2017-04-01 – 2017-04-03 (×6): 5 mg via ORAL
  Filled 2017-04-01 (×6): qty 1

## 2017-04-01 NOTE — Plan of Care (Signed)
Problem: Pain Managment: Goal: General experience of comfort will improve Outcome: Progressing Denies Pain. Continues to have episodes of Nausea. No vomiting seen.

## 2017-04-01 NOTE — Progress Notes (Signed)
[facility] PROGRESS NOTE  [name] [name] FMB:[id number] DOB: [birth year] DOA: 03/31/2017  PCP: Orpah Melter, MD  Brief History/Interval Summary: 56 year old female with a past medical history of metastatic melanoma with metastases to the brain status post treatment and surgery. Also with a history of hypothyroidism. She presented with complaints of chest pain. Pain was associated with palpitations, tachycardia and hypertension. Patient was seen at [facility] emergency department on 5/2 with complaints of nausea, vomiting and diarrhea. She underwent CT scan of the head at that time which did not show any new process in the brain.  Reason for Visit: Chest pain  Consultants: Cardiology  Procedures: Transthoracic echocardiogram is pending  Antibiotics: None  Subjective/Interval History: Patient denies any chest pain this morning. Has been having some nausea. Has had chest pain over the course of the night. No diarrhea since yesterday. No vomiting since yesterday.  ROS: No headaches currently  Objective:  Vital Signs  Vitals:   03/31/17 1238 03/31/17 1621 03/31/17 2024 04/01/17 0450  BP: (!) 135/92 (!) 141/87 (!) 147/89 (!) 152/90  Pulse:   (!) 103 (!) 103  Resp:   18 18  Temp: 98.7 F (37.1 C)  98.4 F (36.9 C) 98.7 F (37.1 C)  TempSrc: Oral  Oral Oral  SpO2: 100%  100% 100%  Weight: 55.8 kg (123 lb)   54.3 kg (119 lb 12.8 oz)  Height: '5\' 7"'$  (1.702 m)       Intake/Output Summary (Last 24 hours) at 04/01/17 1256 Last data filed at 04/01/17 1200  Gross per 24 hour  Intake          3834.58 ml  Output                0 ml  Net          3834.58 ml   Filed Weights   03/31/17 1238 04/01/17 0450  Weight: 55.8 kg (123 lb) 54.3 kg (119 lb 12.8 oz)    General appearance: alert, cooperative and anxious Resp: clear to auscultation bilaterally Cardio: regular rate and rhythm, S1, S2 normal, no murmur, click, rub or gallop GI: soft, non-tender;  bowel sounds normal; no masses,  no organomegaly Extremities: extremities normal, atraumatic, no cyanosis or edema Neurologic: No focal deficits  Lab Results:  Data Reviewed: I have personally reviewed following labs and imaging studies  CBC:  Recent Labs Lab 03/31/17 0341 04/01/17 0638  WBC 13.6* 8.7  NEUTROABS 9.2*  --   HGB 13.0 11.5*  HCT 39.0 35.6*  MCV 83.9 84.6  PLT 323 701    Basic Metabolic Panel:  Recent Labs Lab 03/31/17 0341 03/31/17 1215 04/01/17 0638  NA 135  --  135  K 2.8*  --  3.4*  CL 102  --  106  CO2 21*  --  17*  GLUCOSE 109*  --  69  BUN 7  --  <5*  CREATININE 0.81  --  0.58  CALCIUM 9.3  --  8.8*  MG  --  1.6*  --     GFR: Estimated Creatinine Clearance: 67.3 mL/min (by C-G formula based on SCr of 0.58 mg/dL).  Liver Function Tests:  Recent Labs Lab 04/01/17 0638  AST 22  ALT 21  ALKPHOS 88  BILITOT 1.2  PROT 6.1*  ALBUMIN 3.1*    Cardiac Enzymes:  Recent Labs Lab 03/31/17 0533 03/31/17 1215 03/31/17 1521 03/31/17 1728  TROPONINI 0.09* 0.06* 0.08* 0.06*     Recent Results (from the  past 240 hour(s))  Culture, blood (Routine X 2) w Reflex to ID Panel     Status: None (Preliminary result)   Collection Time: 03/31/17 12:11 PM  Result Value Ref Range Status   Specimen Description BLOOD RIGHT WRIST  Final   Special Requests   Final    BOTTLES DRAWN AEROBIC AND ANAEROBIC Blood Culture results may not be optimal due to an inadequate volume of blood received in culture bottles   Culture NO GROWTH < 24 HOURS  Final   Report Status PENDING  Incomplete  Culture, blood (Routine X 2) w Reflex to ID Panel     Status: None (Preliminary result)   Collection Time: 03/31/17 12:19 PM  Result Value Ref Range Status   Specimen Description BLOOD RIGHT HAND  Final   Special Requests   Final    BOTTLES DRAWN AEROBIC AND ANAEROBIC Blood Culture adequate volume   Culture NO GROWTH < 24 HOURS  Final   Report Status PENDING  Incomplete    C difficile quick scan w PCR reflex     Status: None   Collection Time: 03/31/17  7:29 PM  Result Value Ref Range Status   C Diff antigen NEGATIVE NEGATIVE Final   C Diff toxin NEGATIVE NEGATIVE Final   C Diff interpretation No C. difficile detected.  Final      Radiology Studies: Dg Chest 2 View  Result Date: 03/31/2017 CLINICAL DATA:  Chest pain and dyspnea tonight EXAM: CHEST  2 VIEW COMPARISON:  03/25/2014 FINDINGS: Prior resection in the left lung. The lungs are otherwise clear. The pulmonary vasculature is normal. Heart size is normal. Hilar and mediastinal contours are unremarkable. There is no pleural effusion. IMPRESSION: No active cardiopulmonary disease. Electronically Signed   By: Andreas Newport M.D.   On: 03/31/2017 04:26     Medications:  Scheduled: . calcium carbonate  200 mg of elemental calcium Oral BID WC  . enoxaparin (LOVENOX) injection  40 mg Subcutaneous Q24H  . gabapentin  900 mg Oral QHS  . hydrocortisone  5 mg Oral TID  . lactobacillus acidophilus  2 tablet Oral TID  . levothyroxine  100 mcg Oral QAC breakfast  . mirtazapine  30 mg Oral QHS   Continuous: . sodium chloride 75 mL/hr at 04/01/17 1103   OZD:[id number], bisacodyl, loperamide, LORazepam, ondansetron **OR** ondansetron (ZOFRAN) IV, promethazine, senna-docusate, simethicone  Assessment/Plan:  Active Problems:   Cerebellar mass   Hypothyroid   Tobacco abuse   S/P craniotomy   Colitis   Protein-calorie malnutrition, severe (HCC)   Metastatic melanoma to lung (HCC)   Vomiting   GERD (gastroesophageal reflux disease)   Anxiety   Dementia   Left homonymous hemianopsia   Secondary malignant melanoma of brain ([place])   Elevated troponin   Chest pain    Chest pain Patient is noted to have very minimal elevation in troponin, but not thought to be significant. Initial EKG did show nonspecific findings. Cardiology is following. Echocardiogram is pending. It is felt that her symptoms  could be GI in origin. Patient also has poorly controlled thyroid disease and this could also be contributing to her symptoms. Continue aspirin for now. Await echocardiogram. PPI.  Nausea, vomiting and diarrhea Etiology unclear. Possible viral gastroenteritis. C. difficile was negative. Continue symptomatic treatment with antiemetics. PPI. Continue probiotics. Imodium as needed. Cut back on IV fluids.  Severe hypokalemia and hypomagnesemia. These were repleted. Repeat labs tomorrow morning.  Hypothyroidism, Keytruda induced Noted to be on Synthroid. Outside labs were  checked. She had low TSH and high free T4 on May 2. TSH was 0.09 and free T4 was 1.62. Some of her symptoms could be due to the iatrogenic hyperthyroidism. Decrease dose of Synthroid. She is followed by endocrinology at [facility]. She will need to have repeat thyroid functions in 4-6 weeks.  History of adrenal insufficiency On chronic steroids Continue cortisol.  Anxiety disorder Seems to be driving lot of her issues. Continue home medications.  History of malignant melanoma with h/o metastases to brain She is status post resection of brain tumors and radiation treatment. She is currently on hematologic treatment. CT head done when she was at [facility] few days ago was unremarkable for any acute process. Patient also has a previous history of lung cancer and is status post VATS procedure and radiation.. These issues appear to be stable.   DVT Prophylaxis: Lovenox    Code Status: Full code  Family Communication: Discussed with the patient  Disposition Plan: Await echocardiogram.    LOS: 0 days   [facility] Pager [phone] 04/01/2017, 12:56 PM  If 7PM-7AM, please contact night-coverage at [email or web address], password [facility]

## 2017-04-01 NOTE — Progress Notes (Signed)
Progress Note  Patient Name: [name] [name] Date of Encounter: 04/01/2017  Subjective   Continued N/V over night.   Inpatient Medications    Scheduled Meds: . calcium carbonate  200 mg of elemental calcium Oral BID WC  . enoxaparin (LOVENOX) injection  40 mg Subcutaneous Q24H  . gabapentin  900 mg Oral QHS  . hydrocortisone  5 mg Oral TID  . lactobacillus acidophilus  2 tablet Oral TID  . levothyroxine  100 mcg Oral QAC breakfast  . mirtazapine  30 mg Oral QHS  . potassium chloride  40 mEq Oral Once   Continuous Infusions: . sodium chloride 125 mL/hr at 04/01/17 0510  . magnesium sulfate 1 - 4 g bolus IVPB     PRN Meds: acetaminophen, bisacodyl, loperamide, LORazepam, ondansetron **OR** ondansetron (ZOFRAN) IV, promethazine, senna-docusate, simethicone   Vital Signs    Vitals:   03/31/17 1238 03/31/17 1621 03/31/17 2024 04/01/17 0450  BP: (!) 135/92 (!) 141/87 (!) 147/89 (!) 152/90  Pulse:   (!) 103 (!) 103  Resp:   18 18  Temp: 98.7 F (37.1 C)  98.4 F (36.9 C) 98.7 F (37.1 C)  TempSrc: Oral  Oral Oral  SpO2: 100%  100% 100%  Weight: 123 lb (55.8 kg)   119 lb 12.8 oz (54.3 kg)  Height: '5\' 7"'$  (1.702 m)       Intake/Output Summary (Last 24 hours) at 04/01/17 1048 Last data filed at 04/01/17 0600  Gross per 24 hour  Intake          2842.08 ml  Output                0 ml  Net          2842.08 ml   Filed Weights   03/31/17 1238 04/01/17 0450  Weight: 123 lb (55.8 kg) 119 lb 12.8 oz (54.3 kg)    Telemetry     - Personally Reviewed  ECG     - Personally Reviewed  Physical Exam   GEN: No acute distress.   Neck: No JVD Cardiac: RRR, no murmurs, rubs, or gallops.  Respiratory: Clear to auscultation bilaterally. GI: Soft, nontender, non-distended  MS: No edema; No deformity. Neuro:  Nonfocal  Psych: Normal affect   Labs    Chemistry Recent Labs Lab 03/31/17 0341 04/01/17 0638  NA 135 135  K 2.8* 3.4*  CL 102 106  CO2 21* 17*  GLUCOSE  109* 69  BUN 7 <5*  CREATININE 0.81 0.58  CALCIUM 9.3 8.8*  PROT  --  6.1*  ALBUMIN  --  3.1*  AST  --  22  ALT  --  21  ALKPHOS  --  88  BILITOT  --  1.2  GFRNONAA >60 >60  GFRAA >60 >60  ANIONGAP 12 12     Hematology Recent Labs Lab 03/31/17 0341 04/01/17 0638  WBC 13.6* 8.7  RBC 4.65 4.21  HGB 13.0 11.5*  HCT 39.0 35.6*  MCV 83.9 84.6  MCH 28.0 27.3  MCHC 33.3 32.3  RDW 15.0 14.9  PLT 323 275    Cardiac Enzymes Recent Labs Lab 03/31/17 0533 03/31/17 1215 03/31/17 1521 03/31/17 1728  TROPONINI 0.09* 0.06* 0.08* 0.06*    Recent Labs Lab 03/31/17 0400  TROPIPOC 0.05     BNPNo results for input(s): BNP, PROBNP in the last 168 hours.   DDimer No results for input(s): DDIMER in the last 168 hours.   Radiology    Dg Chest  2 View  Result Date: 03/31/2017 CLINICAL DATA:  Chest pain and dyspnea tonight EXAM: CHEST  2 VIEW COMPARISON:  03/25/2014 FINDINGS: Prior resection in the left lung. The lungs are otherwise clear. The pulmonary vasculature is normal. Heart size is normal. Hilar and mediastinal contours are unremarkable. There is no pleural effusion. IMPRESSION: No active cardiopulmonary disease. Electronically Signed   By: Andreas Newport M.D.   On: 03/31/2017 04:26    Cardiac Studies    Patient Profile     56 y.o. female admitted with N/VD and chest pain  Assessment & Plan    1. Elevated troponin - mild troponin elevation peak .09, trending down in setting of significant tachcyardia and GI illness. EKG nonspecific ST/T changes - f/u echo results. At this time do not suspect primary cardiac process, pain likely related to recurrent N/V.   2. N/V/D - per primary team   3. Metatsatic melanoma - s/p craniotomy and wedge resection 2014/2015, followed at [facility] - last [facility] note states imaging showed no evidence of metastatic disease  4. Adrenal insufficiency - followed by endocrine     Signed, Carlyle Dolly, MD  04/01/2017, 10:48 AM

## 2017-04-02 ENCOUNTER — Encounter (HOSPITAL_COMMUNITY): Payer: Self-pay | Admitting: *Deleted

## 2017-04-02 ENCOUNTER — Inpatient Hospital Stay (HOSPITAL_COMMUNITY): Payer: Medicare HMO

## 2017-04-02 DIAGNOSIS — R197 Diarrhea, unspecified: Secondary | ICD-10-CM

## 2017-04-02 DIAGNOSIS — R072 Precordial pain: Secondary | ICD-10-CM

## 2017-04-02 DIAGNOSIS — R112 Nausea with vomiting, unspecified: Secondary | ICD-10-CM

## 2017-04-02 LAB — MAGNESIUM: Magnesium: 1.7 mg/dL (ref 1.7–2.4)

## 2017-04-02 LAB — CBC
HEMATOCRIT: 39.3 % (ref 36.0–46.0)
HEMOGLOBIN: 12.7 g/dL (ref 12.0–15.0)
MCH: 27.3 pg (ref 26.0–34.0)
MCHC: 32.3 g/dL (ref 30.0–36.0)
MCV: 84.5 fL (ref 78.0–100.0)
Platelets: 327 10*3/uL (ref 150–400)
RBC: 4.65 MIL/uL (ref 3.87–5.11)
RDW: 14.9 % (ref 11.5–15.5)
WBC: 7.9 10*3/uL (ref 4.0–10.5)

## 2017-04-02 LAB — BASIC METABOLIC PANEL
Anion gap: 15 (ref 5–15)
BUN: 5 mg/dL — ABNORMAL LOW (ref 6–20)
CALCIUM: 9.3 mg/dL (ref 8.9–10.3)
CHLORIDE: 108 mmol/L (ref 101–111)
CO2: 12 mmol/L — AB (ref 22–32)
CREATININE: 0.79 mg/dL (ref 0.44–1.00)
GFR calc Af Amer: >60 mL/min (ref >60)
GFR calc non Af Amer: >60 mL/min (ref >60)
GLUCOSE: 54 mg/dL — AB (ref 65–99)
Potassium: 4 mmol/L (ref 3.5–5.1)
Sodium: 135 mmol/L (ref 135–145)

## 2017-04-02 LAB — ECHOCARDIOGRAM COMPLETE
Height: 67 in
Weight: 1926.4 oz

## 2017-04-02 MED ORDER — IOPAMIDOL (ISOVUE-370) INJECTION 76%
INTRAVENOUS | Status: AC
  Administered 2017-04-02: 100 mL
  Filled 2017-04-02: qty 100

## 2017-04-02 MED ORDER — METOCLOPRAMIDE HCL 5 MG/ML IJ SOLN
5.0000 mg | Freq: Three times a day (TID) | INTRAMUSCULAR | Status: DC
Start: 2017-04-02 — End: 2017-04-03
  Administered 2017-04-02 – 2017-04-03 (×3): 5 mg via INTRAVENOUS
  Filled 2017-04-02 (×3): qty 2

## 2017-04-02 MED ORDER — LOPERAMIDE HCL 2 MG PO CAPS
4.0000 mg | ORAL_CAPSULE | Freq: Once | ORAL | Status: AC
Start: 2017-04-02 — End: 2017-04-02
  Administered 2017-04-02: 4 mg via ORAL
  Filled 2017-04-02: qty 2

## 2017-04-02 MED ORDER — MAGNESIUM SULFATE IN D5W 1-5 GM/100ML-% IV SOLN
1.0000 g | Freq: Once | INTRAVENOUS | Status: AC
  Administered 2017-04-02: 1 g via INTRAVENOUS
  Filled 2017-04-02: qty 100

## 2017-04-02 MED ORDER — SODIUM BICARBONATE 650 MG PO TABS
1300.0000 mg | ORAL_TABLET | Freq: Two times a day (BID) | ORAL | Status: DC
  Administered 2017-04-02 – 2017-04-03 (×3): 1300 mg via ORAL
  Filled 2017-04-02 (×3): qty 2

## 2017-04-02 NOTE — Plan of Care (Signed)
Problem: Fluid Volume: Goal: Ability to maintain a balanced intake and output will improve Outcome: Progressing IV fluids continued at 75 cc/hr.   Problem: Bowel/Gastric: Goal: Will not experience complications related to bowel motility Outcome: Progressing Less BM occurences per patient. Still yellow/green and watery.

## 2017-04-02 NOTE — Progress Notes (Signed)
  Echocardiogram 2D Echocardiogram has been performed.  Tresa Res 04/02/2017, 10:53 AM

## 2017-04-02 NOTE — Progress Notes (Signed)
Progress Note  Patient Name: [name] [name] Date of Encounter: 04/02/2017  Subjective   N/V improving, chest pain improving.    Inpatient Medications    Scheduled Meds: . calcium carbonate  200 mg of elemental calcium Oral BID WC  . enoxaparin (LOVENOX) injection  40 mg Subcutaneous Q24H  . gabapentin  900 mg Oral QHS  . hydrocortisone  5 mg Oral TID  . lactobacillus acidophilus  2 tablet Oral TID  . levothyroxine  88 mcg Oral QAC breakfast  . mirtazapine  30 mg Oral QHS  . pantoprazole  40 mg Oral BID   Continuous Infusions: . sodium chloride 75 mL/hr at 04/02/17 0330   PRN Meds: acetaminophen, bisacodyl, loperamide, LORazepam, ondansetron **OR** ondansetron (ZOFRAN) IV, promethazine, senna-docusate, simethicone   Vital Signs    Vitals:   04/01/17 1453 04/01/17 2100 04/02/17 0500 04/02/17 0700  BP: (!) 143/98 139/89 107/65   Pulse: 95 (!) 103 98   Resp: '18 18 18   '$ Temp: 97.8 F (36.6 C) 98.5 F (36.9 C) 98.3 F (36.8 C)   TempSrc: Oral Oral Oral   SpO2: 100% 100% 98%   Weight:    120 lb 6.4 oz (54.6 kg)  Height:        Intake/Output Summary (Last 24 hours) at 04/02/17 0923 Last data filed at 04/01/17 2107  Gross per 24 hour  Intake            902.5 ml  Output                0 ml  Net            902.5 ml   Filed Weights   03/31/17 1238 04/01/17 0450 04/02/17 0700  Weight: 123 lb (55.8 kg) 119 lb 12.8 oz (54.3 kg) 120 lb 6.4 oz (54.6 kg)    Telemetry     - Personally Reviewed- SR and sinus tach  ECG     - Personally Reviewed  Physical Exam   GEN: No acute distress.   Neck: No JVD Cardiac: RRR, no murmurs, rubs, or gallops.  Respiratory: Clear to auscultation bilaterally. GI: Soft, nontender, non-distended  MS: No edema; No deformity. Neuro:  Nonfocal  Psych: Normal affect   Labs    Chemistry  Recent Labs Lab 03/31/17 0341 04/01/17 0638 04/02/17 0241  NA 135 135 135  K 2.8* 3.4* 4.0  CL 102 106 108  CO2 21* 17* 12*  GLUCOSE  109* 69 54*  BUN 7 <5* 5*  CREATININE 0.81 0.58 0.79  CALCIUM 9.3 8.8* 9.3  PROT  --  6.1*  --   ALBUMIN  --  3.1*  --   AST  --  22  --   ALT  --  21  --   ALKPHOS  --  88  --   BILITOT  --  1.2  --   GFRNONAA >60 >60 >60  GFRAA >60 >60 >60  ANIONGAP '12 12 15     '$ Hematology  Recent Labs Lab 03/31/17 0341 04/01/17 0638 04/02/17 0241  WBC 13.6* 8.7 7.9  RBC 4.65 4.21 4.65  HGB 13.0 11.5* 12.7  HCT 39.0 35.6* 39.3  MCV 83.9 84.6 84.5  MCH 28.0 27.3 27.3  MCHC 33.3 32.3 32.3  RDW 15.0 14.9 14.9  PLT 323 275 327    Cardiac Enzymes  Recent Labs Lab 03/31/17 0533 03/31/17 1215 03/31/17 1521 03/31/17 1728  TROPONINI 0.09* 0.06* 0.08* 0.06*     Recent Labs Lab  03/31/17 0400  TROPIPOC 0.05     BNPNo results for input(s): BNP, PROBNP in the last 168 hours.   DDimer   Recent Labs Lab 04/01/17 1835  DDIMER 2.96*     Radiology    No results found.  Cardiac Studies    Patient Profile     56 y.o. female admitted with N/VD and chest pain  Assessment & Plan    1. Elevated troponin - mild troponin elevation peak .09, trending down in setting of significant tachcyardia and GI illness. EKG nonspecific ST/T changes - fecho pending. At this time do not suspect primary cardiac process, pain likely related to recurrent N/V.   - appears D-dimer checked yesterday afternoon, elevated. With her cancer history, sinus tachycarida and elevated D-dimer would consider CT PE, though her presentation is not classic.    2. N/V/D - per primary team   3. Metatsatic melanoma - s/p craniotomy and wedge resection 2014/2015, followed at [facility] - last [facility] note states imaging showed no evidence of metastatic disease  4. Adrenal insufficiency - followed by endocrine     Signed, Carlyle Dolly, MD  04/02/2017, 9:23 AM

## 2017-04-02 NOTE — Progress Notes (Signed)
[facility] PROGRESS NOTE  [name] [name] VVO:[id number] DOB: [birth year] DOA: 03/31/2017  PCP: Orpah Melter, MD  Brief History/Interval Summary: 56 year old female with a past medical history of metastatic melanoma with metastases to the brain status post treatment and surgery. Also with a history of hypothyroidism. She presented with complaints of chest pain. Pain was associated with palpitations, tachycardia and hypertension. Patient was seen at [facility] emergency department on 5/2 with complaints of nausea, vomiting and diarrhea. She underwent CT scan of the head at that time which did not show any new process in the brain.  Reason for Visit: Chest pain  Consultants: Cardiology  Procedures: Transthoracic echocardiogram is pending  Antibiotics: None  Subjective/Interval History: Patient states that she is feeling better, however, continues to have diarrhea. She had episodes of vomiting yesterday. Very nauseated today. Denies any abdominal pain. Occasional episodes of shortness of breath and chest pain though none currently.   ROS: No headaches currently  Objective:  Vital Signs  Vitals:   04/01/17 1453 04/01/17 2100 04/02/17 0500 04/02/17 0700  BP: (!) 143/98 139/89 107/65   Pulse: 95 (!) 103 98   Resp: '18 18 18   '$ Temp: 97.8 F (36.6 C) 98.5 F (36.9 C) 98.3 F (36.8 C)   TempSrc: Oral Oral Oral   SpO2: 100% 100% 98%   Weight:    54.6 kg (120 lb 6.4 oz)  Height:        Intake/Output Summary (Last 24 hours) at 04/02/17 0942 Last data filed at 04/01/17 2107  Gross per 24 hour  Intake            902.5 ml  Output                0 ml  Net            902.5 ml   Filed Weights   03/31/17 1238 04/01/17 0450 04/02/17 0700  Weight: 55.8 kg (123 lb) 54.3 kg (119 lb 12.8 oz) 54.6 kg (120 lb 6.4 oz)    General appearance: Awake, alert. Anxious. No distress. Resp: Clear to auscultation bilaterally. No wheezing, rales or rhonchi. Cardio: S1,  S2 is normal, regular. No S3, S4. No rubs, murmurs, or bruit. GI: Abdomen is soft. Nontender, nondistended. Bowel sounds are present. No masses or organomegaly Extremities: No edema Neurologic: No focal deficits  Lab Results:  Data Reviewed: I have personally reviewed following labs and imaging studies  CBC:  Recent Labs Lab 03/31/17 0341 04/01/17 0638 04/02/17 0241  WBC 13.6* 8.7 7.9  NEUTROABS 9.2*  --   --   HGB 13.0 11.5* 12.7  HCT 39.0 35.6* 39.3  MCV 83.9 84.6 84.5  PLT 323 275 269    Basic Metabolic Panel:  Recent Labs Lab 03/31/17 0341 03/31/17 1215 04/01/17 0638 04/02/17 0241  NA 135  --  135 135  K 2.8*  --  3.4* 4.0  CL 102  --  106 108  CO2 21*  --  17* 12*  GLUCOSE 109*  --  69 54*  BUN 7  --  <5* 5*  CREATININE 0.81  --  0.58 0.79  CALCIUM 9.3  --  8.8* 9.3  MG  --  1.6*  --  1.7    GFR: Estimated Creatinine Clearance: 67.7 mL/min (by C-G formula based on SCr of 0.79 mg/dL).  Liver Function Tests:  Recent Labs Lab 04/01/17 0638  AST 22  ALT 21  ALKPHOS 88  BILITOT 1.2  PROT  6.1*  ALBUMIN 3.1*    Cardiac Enzymes:  Recent Labs Lab 03/31/17 0533 03/31/17 1215 03/31/17 1521 03/31/17 1728  TROPONINI 0.09* 0.06* 0.08* 0.06*     Recent Results (from the past 240 hour(s))  Culture, blood (Routine X 2) w Reflex to ID Panel     Status: None (Preliminary result)   Collection Time: 03/31/17 12:11 PM  Result Value Ref Range Status   Specimen Description BLOOD RIGHT WRIST  Final   Special Requests   Final    BOTTLES DRAWN AEROBIC AND ANAEROBIC Blood Culture results may not be optimal due to an inadequate volume of blood received in culture bottles   Culture NO GROWTH < 24 HOURS  Final   Report Status PENDING  Incomplete  Culture, blood (Routine X 2) w Reflex to ID Panel     Status: None (Preliminary result)   Collection Time: 03/31/17 12:19 PM  Result Value Ref Range Status   Specimen Description BLOOD RIGHT HAND  Final   Special  Requests   Final    BOTTLES DRAWN AEROBIC AND ANAEROBIC Blood Culture adequate volume   Culture NO GROWTH < 24 HOURS  Final   Report Status PENDING  Incomplete  C difficile quick scan w PCR reflex     Status: None   Collection Time: 03/31/17  7:29 PM  Result Value Ref Range Status   C Diff antigen NEGATIVE NEGATIVE Final   C Diff toxin NEGATIVE NEGATIVE Final   C Diff interpretation No C. difficile detected.  Final      Radiology Studies: No results found.   Medications:  Scheduled: . calcium carbonate  200 mg of elemental calcium Oral BID WC  . enoxaparin (LOVENOX) injection  40 mg Subcutaneous Q24H  . gabapentin  900 mg Oral QHS  . hydrocortisone  5 mg Oral TID  . lactobacillus acidophilus  2 tablet Oral TID  . levothyroxine  88 mcg Oral QAC breakfast  . loperamide  4 mg Oral Once  . metoCLOPramide (REGLAN) injection  5 mg Intravenous Q8H  . mirtazapine  30 mg Oral QHS  . pantoprazole  40 mg Oral BID  . sodium bicarbonate  1,300 mg Oral BID   Continuous: . sodium chloride 75 mL/hr at 04/02/17 0330   OZD:[id number], bisacodyl, loperamide, LORazepam, ondansetron **OR** ondansetron (ZOFRAN) IV, promethazine, senna-docusate, simethicone  Assessment/Plan:  Active Problems:   Cerebellar mass   Hypothyroid   Tobacco abuse   S/P craniotomy   Colitis   Protein-calorie malnutrition, severe (HCC)   Metastatic melanoma to lung (HCC)   Vomiting   GERD (gastroesophageal reflux disease)   Anxiety   Dementia   Left homonymous hemianopsia   Secondary malignant melanoma of brain ([place])   Elevated troponin   Chest pain    Chest pain Patient is noted to have very minimal elevation in troponin, but not thought to be significant. Initial EKG did show nonspecific findings. Cardiology is following. Echocardiogram is pending. It is felt that her symptoms could be GI in origin. Patient also has poorly controlled thyroid disease and this could also be contributing to her symptoms.  Continue aspirin for now. Await echocardiogram. PPI. Due to history of malignancy, D-dimer was checked and was noted to be elevated. CT scan has been ordered.  Nausea, vomiting and diarrhea Etiology unclear. Continues to have symptoms. C. difficile was negative. Imodium to be given. Probiotics. Trial of Reglan.   Hypokalemia and hypomagnesemia. Potassium is normal. Give additional dose of magnesium today.  Normal anion  gap metabolic acidosis Most likely due to diarrhea. Oral bicarbonate for now. Repeat labs tomorrow.  Hypothyroidism, Keytruda induced Noted to be on Synthroid. Outside labs were checked. She had low TSH and high free T4 on May 2. TSH was 0.09 and free T4 was 1.62. Some of her symptoms could be due to the iatrogenic hyperthyroidism. Dose of Synthroid was reduced to 88 g. She is followed by endocrinology at [facility]. She will need to have repeat thyroid functions in 4-6 weeks.  History of adrenal insufficiency On chronic steroids Continue hydrocortisone. Wonder if her symptoms could be suggestive of inadequately treated adrenal insufficiency. Check Cortisol and ACTH in AM.  Anxiety disorder Seems to be driving lot of her issues. Continue home medications.  History of malignant melanoma with h/o metastases to brain She is status post resection of brain tumors and radiation treatment. She is currently on hematologic treatment. CT head done when she was at [facility] few days ago was unremarkable for any acute process. Patient also has a previous history of lung cancer and is status post VATS procedure and radiation.. These issues appear to be stable.   DVT Prophylaxis: Lovenox    Code Status: Full code  Family Communication: Discussed with the patient  Disposition Plan: Await echocardiogram and CT angio Chest.    LOS: 1 day   [facility] Pager [phone] 04/02/2017, 9:42 AM  If 7PM-7AM, please contact night-coverage at [email or web address], password  [facility]

## 2017-04-03 LAB — BASIC METABOLIC PANEL WITH GFR
Anion gap: 12 (ref 5–15)
BUN: 5 mg/dL — ABNORMAL LOW (ref 6–20)
CO2: 17 mmol/L — ABNORMAL LOW (ref 22–32)
Calcium: 9.2 mg/dL (ref 8.9–10.3)
Chloride: 112 mmol/L — ABNORMAL HIGH (ref 101–111)
Creatinine, Ser: 0.71 mg/dL (ref 0.44–1.00)
GFR calc Af Amer: >60 mL/min
GFR calc non Af Amer: >60 mL/min
Glucose, Bld: 96 mg/dL (ref 65–99)
Potassium: 3.4 mmol/L — ABNORMAL LOW (ref 3.5–5.1)
Sodium: 141 mmol/L (ref 135–145)

## 2017-04-03 LAB — CBC
HCT: 36.6 % (ref 36.0–46.0)
Hemoglobin: 12.3 g/dL (ref 12.0–15.0)
MCH: 28.1 pg (ref 26.0–34.0)
MCHC: 33.6 g/dL (ref 30.0–36.0)
MCV: 83.8 fL (ref 78.0–100.0)
Platelets: 327 10*3/uL (ref 150–400)
RBC: 4.37 MIL/uL (ref 3.87–5.11)
RDW: 15.2 % (ref 11.5–15.5)
WBC: 6 10*3/uL (ref 4.0–10.5)

## 2017-04-03 MED ORDER — LOPERAMIDE HCL 2 MG PO CAPS: 2.0000 mg | ORAL_CAPSULE | Freq: Three times a day (TID) | ORAL | 0 refills | Status: DC | PRN

## 2017-04-03 MED ORDER — UNABLE TO FIND: 0 refills | Status: DC

## 2017-04-03 MED ORDER — METOCLOPRAMIDE HCL 5 MG PO TABS: 5.0000 mg | ORAL_TABLET | Freq: Three times a day (TID) | ORAL | Status: DC

## 2017-04-03 MED ORDER — POTASSIUM CHLORIDE CRYS ER 20 MEQ PO TBCR: 20.0000 meq | EXTENDED_RELEASE_TABLET | Freq: Every day | ORAL | 0 refills | Status: DC

## 2017-04-03 MED ORDER — PANTOPRAZOLE SODIUM 40 MG PO TBEC: 40.0000 mg | DELAYED_RELEASE_TABLET | Freq: Every day | ORAL | 0 refills | Status: DC

## 2017-04-03 MED ORDER — SODIUM BICARBONATE 650 MG PO TABS: 650.0000 mg | ORAL_TABLET | Freq: Two times a day (BID) | ORAL | 0 refills | Status: DC

## 2017-04-03 MED ORDER — POTASSIUM CHLORIDE CRYS ER 20 MEQ PO TBCR
40.0000 meq | EXTENDED_RELEASE_TABLET | Freq: Once | ORAL | Status: AC
  Administered 2017-04-03: 40 meq via ORAL
  Filled 2017-04-03: qty 2

## 2017-04-03 MED ORDER — BACID PO TABS: 2.0000 | ORAL_TABLET | Freq: Three times a day (TID) | ORAL | 0 refills | Status: DC

## 2017-04-03 MED ORDER — METOCLOPRAMIDE HCL 5 MG PO TABS: 5.0000 mg | ORAL_TABLET | Freq: Three times a day (TID) | ORAL | 0 refills | Status: DC

## 2017-04-03 MED ORDER — LEVOTHYROXINE SODIUM 88 MCG PO TABS: 88.0000 ug | ORAL_TABLET | Freq: Every day | ORAL | 0 refills | Status: DC

## 2017-04-03 NOTE — Discharge Summary (Signed)
[facility]  Physician Discharge Summary   Patient ID: [name] [name] MRN: [id number] DOB/AGE: 01-08-1961 56 y.o.  Admit date: 03/31/2017 Discharge date: 04/03/2017  PCP: Orpah Melter, MD  DISCHARGE DIAGNOSES:  Active Problems:   Cerebellar mass   Hypothyroid   Tobacco abuse   S/P craniotomy   Colitis   Protein-calorie malnutrition, severe ([place])   Metastatic melanoma to lung (HCC)   Vomiting   GERD (gastroesophageal reflux disease)   Anxiety   Dementia   Left homonymous hemianopsia   Secondary malignant melanoma of brain ([place])   Elevated troponin   Chest pain   RECOMMENDATIONS FOR OUTPATIENT FOLLOW UP: 1. She will need a basic metabolic panel to check her potassium and other electrolytes within one week.  2. She will need to have thyroid function tests checked in 4-6 weeks now that the dose of his Synthroid has been reduced.   DISCHARGE CONDITION: fair  Diet recommendation: As before  Filed Weights   04/01/17 0450 04/02/17 0700 04/03/17 0641  Weight: 54.3 kg (119 lb 12.8 oz) 54.6 kg (120 lb 6.4 oz) 52.9 kg (116 lb 9.6 oz)    INITIAL HISTORY: 56 year old female with a past medical history of metastatic melanoma with metastases to the brain status post treatment and surgery. Also with a history of hypothyroidism. She presented with complaints of chest pain. Pain was associated with palpitations, tachycardia and hypertension. Patient was seen at [facility] emergency department on 5/2 with complaints of nausea, vomiting and diarrhea. She underwent CT scan of the head at that time which did not show any new process in the brain.  Consultants: Cardiology  Procedures:  Transthoracic echocardiogram Study Conclusions  - Left ventricle: The cavity size was normal. Systolic function was   vigorous. The estimated ejection fraction was in the range of 65%   to 70%. Wall motion was normal; there were no regional wall   motion abnormalities.  There was an increased relative   contribution of atrial contraction to ventricular filling.   Doppler parameters are consistent with abnormal left ventricular   relaxation (grade 1 diastolic dysfunction). - Aortic valve: Trileaflet; mildly thickened, mildly calcified   leaflets. - Atrial septum: There was increased thickness of the septum,   consistent with lipomatous hypertrophy. - Tricuspid valve: There was trivial regurgitation.   HOSPITAL COURSE:   Chest pain Patient is noted to have very minimal elevation in troponin, but not thought to be significant. Initial EKG did show nonspecific findings. Cardiology was consulted. Echocardiogram as above. No wall motion abnormalities noted. It is felt that her symptoms could be GI in origin. Patient also has poorly controlled thyroid disease and this could also be contributing to her symptoms. Her symptoms are resolved. Continue PPI. Due to history of malignancy, D-dimer was checked and was noted to be elevated. CT scan did not show any PE.  Nausea, vomiting and diarrhea She could've had gastroenteritis. She was given symptomatic treatment with antiemetics and antimotility agents. Her nausea, vomiting has improved. Diarrhea has subsided. She was given Reglan. She was asked to follow-up with her outpatient provider. Probiotics. C. difficile was negative.   Hypokalemia and hypomagnesemia. These were repleted. Outpatient labs one week.  Normal anion gap metabolic acidosis Most likely due to diarrhea. Bicarbonate level has improved. Continue Oral bicarbonate for now. Will need labs to be done in one week.  Hypothyroidism, Keytruda induced Noted to be on Synthroid. Outside labs were checked. She had low TSH and high free T4 on  May 2 at [facility]. TSH was 0.09 and free T4 was 1.62. Some of her symptoms could be due to the iatrogenic hyperthyroidism. Dose of Synthroid was reduced to 88 g. She is followed by endocrinology at [facility]. She will need to  have repeat thyroid functions in 4-6 weeks.  History of adrenal insufficiency On chronic steroids Continue hydrocortisone. Her symptoms of nausea and vomiting could also be suggestive of inadequately treated adrenal insufficiency. Further workup was considered. However, she has symptomatically improved significantly over the last 24 hours. She may continue her home dose of steroid. Further management by her endocrinologist.  Anxiety disorder Seems to be driving lot of her issues. Continue home medications.  History of malignant melanoma with h/o metastases to brain She is status post resection of brain tumors and radiation treatment. She is currently on hematologic treatment. CT head done when she was at [facility] few days ago was unremarkable for any acute process. Patient also has a previous history of lung cancer and is status post VATS procedure and radiation.. These issues appear to be stable.  Overall much improved. Patient feels much better. Wishes to go home. Okay for discharge.   PERTINENT LABS:  The results of significant diagnostics from this hospitalization (including imaging, microbiology, ancillary and laboratory) are listed below for reference.    Microbiology: Recent Results (from the past 240 hour(s))  Culture, blood (Routine X 2) w Reflex to ID Panel     Status: None (Preliminary result)   Collection Time: 03/31/17 12:11 PM  Result Value Ref Range Status   Specimen Description BLOOD RIGHT WRIST  Final   Special Requests   Final    BOTTLES DRAWN AEROBIC AND ANAEROBIC Blood Culture results may not be optimal due to an inadequate volume of blood received in culture bottles   Culture NO GROWTH 2 DAYS  Final   Report Status PENDING  Incomplete  Culture, blood (Routine X 2) w Reflex to ID Panel     Status: None (Preliminary result)   Collection Time: 03/31/17 12:19 PM  Result Value Ref Range Status   Specimen Description BLOOD RIGHT HAND  Final   Special Requests    Final    BOTTLES DRAWN AEROBIC AND ANAEROBIC Blood Culture adequate volume   Culture NO GROWTH 2 DAYS  Final   Report Status PENDING  Incomplete  C difficile quick scan w PCR reflex     Status: None   Collection Time: 03/31/17  7:29 PM  Result Value Ref Range Status   C Diff antigen NEGATIVE NEGATIVE Final   C Diff toxin NEGATIVE NEGATIVE Final   C Diff interpretation No C. difficile detected.  Final     Labs: Basic Metabolic Panel:  Recent Labs Lab 03/31/17 0341 03/31/17 1215 04/01/17 0638 04/02/17 0241 04/03/17 0819  NA 135  --  135 135 141  K 2.8*  --  3.4* 4.0 3.4*  CL 102  --  106 108 112*  CO2 21*  --  17* 12* 17*  GLUCOSE 109*  --  69 54* 96  BUN 7  --  <5* 5* 5*  CREATININE 0.81  --  0.58 0.79 0.71  CALCIUM 9.3  --  8.8* 9.3 9.2  MG  --  1.6*  --  1.7  --    Liver Function Tests:  Recent Labs Lab 04/01/17 0638  AST 22  ALT 21  ALKPHOS 88  BILITOT 1.2  PROT 6.1*  ALBUMIN 3.1*   CBC:  Recent Labs Lab  03/31/17 0341 04/01/17 0258 04/02/17 0241 04/03/17 0819  WBC 13.6* 8.7 7.9 6.0  NEUTROABS 9.2*  --   --   --   HGB 13.0 11.5* 12.7 12.3  HCT 39.0 35.6* 39.3 36.6  MCV 83.9 84.6 84.5 83.8  PLT 323 275 327 327   Cardiac Enzymes:  Recent Labs Lab 03/31/17 0533 03/31/17 1215 03/31/17 1521 03/31/17 1728  TROPONINI 0.09* 0.06* 0.08* 0.06*     IMAGING STUDIES Dg Chest 2 View  Result Date: 03/31/2017 CLINICAL DATA:  Chest pain and dyspnea tonight EXAM: CHEST  2 VIEW COMPARISON:  03/25/2014 FINDINGS: Prior resection in the left lung. The lungs are otherwise clear. The pulmonary vasculature is normal. Heart size is normal. Hilar and mediastinal contours are unremarkable. There is no pleural effusion. IMPRESSION: No active cardiopulmonary disease. Electronically Signed   By: Andreas Newport M.D.   On: 03/31/2017 04:26   Ct Angio Chest Pe W Or Wo Contrast  Result Date: 04/02/2017 CLINICAL DATA:  56 year old female with chest pain for 2 days.  History of melanoma. EXAM: CT ANGIOGRAPHY CHEST WITH CONTRAST TECHNIQUE: Multidetector CT imaging of the chest was performed using the standard protocol during bolus administration of intravenous contrast. Multiplanar CT image reconstructions and MIPs were obtained to evaluate the vascular anatomy. CONTRAST:  80 cc intravenous Isovue 3 7 COMPARISON:  03/16/2016 chest CTs from [place]: Cardiovascular: Satisfactory opacification of the pulmonary arteries to the segmental level. No evidence of pulmonary embolism. Heart size unremarkable. A tiny pericardial effusion and minimal coronary artery calcifications again noted. There is no evidence of thoracic aortic aneurysm. Mediastinum/Nodes: No enlarged mediastinal, hilar, or axillary lymph nodes. Thyroid gland, trachea, and esophagus demonstrate no significant findings. Lungs/Pleura: Scarring within the mid and lower left lung again noted. There is no evidence of airspace disease, consolidation, nodule, mass, pleural effusion or pneumothorax. Upper Abdomen: No acute abnormality Musculoskeletal: No chest wall abnormality. No acute or significant osseous findings. Review of the MIP images confirms the above findings. IMPRESSION: No evidence of acute abnormality.  No evidence of pulmonary emboli. Coronary artery disease. Electronically Signed   By: Margarette Canada M.D.   On: 04/02/2017 15:27    DISCHARGE EXAMINATION: Vitals:   04/02/17 0500 04/02/17 0700 04/02/17 2114 04/03/17 0641  BP: 107/65  122/75 (!) 168/77  Pulse: 98  97 69  Resp: 18  18   Temp: 98.3 F (36.8 C)  98.1 F (36.7 C) 98.4 F (36.9 C)  TempSrc: Oral  Oral Oral  SpO2: 98%  100% 100%  Weight:  54.6 kg (120 lb 6.4 oz)  52.9 kg (116 lb 9.6 oz)  Height:       General appearance: alert, cooperative, appears stated age and no distress Resp: clear to auscultation bilaterally Cardio: regular rate and rhythm, S1, S2 normal, no murmur, click, rub or gallop GI: soft, non-tender;  bowel sounds normal; no masses,  no organomegaly  DISPOSITION: Home  Discharge Instructions    Call MD for:  difficulty breathing, headache or visual disturbances    Complete by:  As directed    Call MD for:  extreme fatigue    Complete by:  As directed    Call MD for:  persistant dizziness or light-headedness    Complete by:  As directed    Call MD for:  severe uncontrolled pain    Complete by:  As directed    Call MD for:  temperature >100.4    Complete by:  As directed  Discharge instructions    Complete by:  As directed    1. You will need a basic metabolic panel to check her potassium and other electrolytes within one week. You should have your results checked by your primary care physician. 2. You will need to have your thyroid function tests checked in 4-6 weeks now that the dose of his Synthroid has been reduced. Please follow-up with [facility] endocrine providers for the same. 3. Please seek attention if nausea, vomiting and diarrhea gets worse. 4. Take Reglan as instructed for the next few days and then you can change it to as needed.  You were cared for by a hospitalist during your hospital stay. If you have any questions about your discharge medications or the care you received while you were in the hospital after you are discharged, you can call the unit and asked to speak with the hospitalist on call if the hospitalist that took care of you is not available. Once you are discharged, your primary care physician will handle any further medical issues. Please note that NO REFILLS for any discharge medications will be authorized once you are discharged, as it is imperative that you return to your primary care physician (or establish a relationship with a primary care physician if you do not have one) for your aftercare needs so that they can reassess your need for medications and monitor your lab values. If you do not have a primary care physician, you can call [phone] for a  physician referral.   Increase activity slowly    Complete by:  As directed       ALLERGIES:  Allergies  Allergen Reactions  . Morphine And Related Nausea Only  . Scopolamine Rash     Discharge Medication List as of 04/03/2017 10:43 AM    START taking these medications   Details  lactobacillus acidophilus (BACID) TABS tablet Take 2 tablets by mouth 3 (three) times daily., Starting Mon 04/03/2017, Print    loperamide (IMODIUM) 2 MG capsule Take 1-2 capsules (2-4 mg total) by mouth 3 (three) times daily as needed for diarrhea or loose stools (up to 8 mg daily)., Starting Mon 04/03/2017, Print    metoCLOPramide (REGLAN) 5 MG tablet Take 1 tablet (5 mg total) by mouth 3 (three) times daily before meals., Starting Mon 04/03/2017, Print    potassium chloride SA (K-DUR,KLOR-CON) 20 MEQ tablet Take 1 tablet (20 mEq total) by mouth daily., Starting Mon 04/03/2017, Print    sodium bicarbonate 650 MG tablet Take 1 tablet (650 mg total) by mouth 2 (two) times daily., Starting Mon 04/03/2017, Print    UNABLE TO FIND Blood Work: Basic Metabolic Panel in 1 week. Dx: Hypokalemia. Results to PCP: Dr. Olen Pel., Print      CONTINUE these medications which have CHANGED   Details  levothyroxine (SYNTHROID, LEVOTHROID) 88 MCG tablet Take 1 tablet (88 mcg total) by mouth daily before breakfast., Starting Tue 04/04/2017, Print    pantoprazole (PROTONIX) 40 MG tablet Take 1 tablet (40 mg total) by mouth daily., Starting Mon 04/03/2017, Print      CONTINUE these medications which have NOT CHANGED   Details  gabapentin (NEURONTIN) 600 MG tablet Take 900 mg by mouth at bedtime. , Until Discontinued, Historical Med    hydrocortisone (CORTEF) 5 MG tablet Take 10 mg in am and 5 mg in pm., Historical Med    LORazepam (ATIVAN) 1 MG tablet Take 1 tablet (1 mg total) by mouth every 12 (twelve) hours as needed  for anxiety or sleep., Starting 05/03/2014, Until Discontinued, Print    mirtazapine (REMERON) 30 MG tablet Take 30  mg by mouth at bedtime., Starting Thu 01/26/2017, Historical Med    promethazine (PHENERGAN) 25 MG suppository Place 1 suppository (25 mg total) rectally every 6 (six) hours as needed for nausea or vomiting., Starting Fri 07/10/2015, Print      STOP taking these medications     traMADol (ULTRAM) 50 MG tablet          Follow-up Information    Orpah Melter, MD. Schedule an appointment as soon as possible for a visit in 1 week(s).   Specialty:  Family Medicine Why:  for blood work Sport and exercise psychologist information: [place] [address] [place] [address] [place]: [facility] Pager [phone]  04/03/2017, 1:29 PM

## 2017-04-03 NOTE — Consult Note (Signed)
           [facility] CM Primary Care Navigator  04/03/2017  [name] [name] 01/17/1961 [id number]   Wentto see patient earlier today at the bedside to identify possible discharge needs but she was alreadydischarged.  Patient was discharged home per staff.    Primary care provider's office called (Bonnie)to notify of patient's discharge and need for post hospital follow-up and transition of care.   Made aware to refer patient to [facility] care management ifdeemed necessaryfor services.    For questions, please contact:  Dannielle Huh, BSN, RN- [facility] Primary Care Navigator  Telephone: [phone] [place]

## 2017-04-05 LAB — CULTURE, BLOOD (ROUTINE X 2)
Culture: NO GROWTH
Culture: NO GROWTH
Special Requests: ADEQUATE

## 2017-04-06 DIAGNOSIS — R0789 Other chest pain: Secondary | ICD-10-CM | POA: Diagnosis not present

## 2017-04-06 DIAGNOSIS — R7303 Prediabetes: Secondary | ICD-10-CM | POA: Diagnosis not present

## 2017-04-06 DIAGNOSIS — Z131 Encounter for screening for diabetes mellitus: Secondary | ICD-10-CM | POA: Diagnosis not present

## 2017-04-07 LAB — O&P RESULT

## 2017-04-07 LAB — OVA + PARASITE EXAM

## 2017-05-18 DIAGNOSIS — E038 Other specified hypothyroidism: Secondary | ICD-10-CM | POA: Diagnosis not present

## 2017-05-18 DIAGNOSIS — E2740 Unspecified adrenocortical insufficiency: Secondary | ICD-10-CM | POA: Diagnosis not present

## 2017-05-18 DIAGNOSIS — E063 Autoimmune thyroiditis: Secondary | ICD-10-CM | POA: Diagnosis not present

## 2017-06-05 DIAGNOSIS — E063 Autoimmune thyroiditis: Secondary | ICD-10-CM | POA: Diagnosis not present

## 2017-06-05 DIAGNOSIS — E038 Other specified hypothyroidism: Secondary | ICD-10-CM | POA: Diagnosis not present

## 2017-06-12 DIAGNOSIS — Z85118 Personal history of other malignant neoplasm of bronchus and lung: Secondary | ICD-10-CM | POA: Diagnosis not present

## 2017-06-12 DIAGNOSIS — D649 Anemia, unspecified: Secondary | ICD-10-CM | POA: Diagnosis not present

## 2017-06-12 DIAGNOSIS — C439 Malignant melanoma of skin, unspecified: Secondary | ICD-10-CM | POA: Diagnosis not present

## 2017-06-12 DIAGNOSIS — Z85841 Personal history of malignant neoplasm of brain: Secondary | ICD-10-CM | POA: Diagnosis not present

## 2017-06-12 DIAGNOSIS — E876 Hypokalemia: Secondary | ICD-10-CM | POA: Diagnosis not present

## 2017-06-12 DIAGNOSIS — H547 Unspecified visual loss: Secondary | ICD-10-CM | POA: Diagnosis not present

## 2017-06-12 DIAGNOSIS — R918 Other nonspecific abnormal finding of lung field: Secondary | ICD-10-CM | POA: Diagnosis not present

## 2017-06-12 DIAGNOSIS — R110 Nausea: Secondary | ICD-10-CM | POA: Diagnosis not present

## 2017-06-12 DIAGNOSIS — Z9289 Personal history of other medical treatment: Secondary | ICD-10-CM | POA: Diagnosis not present

## 2017-06-12 DIAGNOSIS — G9389 Other specified disorders of brain: Secondary | ICD-10-CM | POA: Diagnosis not present

## 2017-06-12 DIAGNOSIS — Z85820 Personal history of malignant melanoma of skin: Secondary | ICD-10-CM | POA: Diagnosis not present

## 2017-06-12 DIAGNOSIS — Z08 Encounter for follow-up examination after completed treatment for malignant neoplasm: Secondary | ICD-10-CM | POA: Diagnosis not present

## 2017-07-06 DIAGNOSIS — R51 Headache: Secondary | ICD-10-CM | POA: Diagnosis not present

## 2017-07-06 DIAGNOSIS — G43719 Chronic migraine without aura, intractable, without status migrainosus: Secondary | ICD-10-CM | POA: Diagnosis not present

## 2017-07-12 DIAGNOSIS — H5210 Myopia, unspecified eye: Secondary | ICD-10-CM | POA: Diagnosis not present

## 2017-07-12 DIAGNOSIS — H2510 Age-related nuclear cataract, unspecified eye: Secondary | ICD-10-CM | POA: Diagnosis not present

## 2017-07-19 ENCOUNTER — Ambulatory Visit: Payer: 59 | Admitting: Neurology

## 2017-08-04 ENCOUNTER — Ambulatory Visit (INDEPENDENT_AMBULATORY_CARE_PROVIDER_SITE_OTHER): Payer: Medicare HMO | Admitting: Neurology

## 2017-08-04 ENCOUNTER — Encounter: Payer: Self-pay | Admitting: Neurology

## 2017-08-04 VITALS — BP 104/68 | HR 76 | Wt 118.4 lb

## 2017-08-04 DIAGNOSIS — C439 Malignant melanoma of skin, unspecified: Secondary | ICD-10-CM

## 2017-08-04 DIAGNOSIS — H53462 Homonymous bilateral field defects, left side: Secondary | ICD-10-CM | POA: Diagnosis not present

## 2017-08-04 DIAGNOSIS — C799 Secondary malignant neoplasm of unspecified site: Secondary | ICD-10-CM | POA: Diagnosis not present

## 2017-08-04 NOTE — Patient Instructions (Signed)
Great seeing you! Continue all medications. Follow-up in 1 year, call for any changes.

## 2017-08-04 NOTE — Progress Notes (Signed)
NEUROLOGY FOLLOW UP OFFICE NOTE  [name] [name] [id number]  HISTORY OF PRESENT ILLNESS: I had the pleasure of seeing [name] [name] in follow-up in the neurology clinic on 08/04/2017.  The patient was last seen a year ago when she presented after hospitalization for an episode of altered mental status in April 2016 with abnormal brain MRI, considerations included post-ictal changes, PRES, limbic encephalitis. CSF studies normal. Since her last visit, she continues to do well. She continues to follow-up with [facility]. Her last MRI brain in July 2018 was stable, with revisualization of right temporal and occipital encephalomalacia, unchanged increased T2 FLAIR signal abutting subjacent resection cavity. She denies any dizziness, diplopia, focal numbness/tingling/weakness, no falls. She is not driving due to the left homonymous hemianopia. She continues to follow-up with headache specialist Dr. Domingo Cocking and reports headaches are much better on gabapentin 600mg  qhs, with prn 100mg  dose for rescue.   HPI: This is a 56 yo RH woman with a history of metastatic melanoma with brain metastasis s/p craniotomy, with negative MRI brain scans at [place] (most recently 02/25/15), in her usual state of health until 03/21/15 when she presented to [facility] ER with altered mental status. She and her husband went camping from 4/13 to 4/17. She recalls having a bad headache and was up all night. She did not recall much of the drive back, but apparently her husband had difficulty arousing her, then she was just looking at him, babbling, diaphoretic. She was brought to [facility] ER where she was evaluated by Neurology, per notes she would have intermittent periods where she would become lucid, then become non-verbal with eyes staring off to either side. There is note that she had diarrhea and nausea the day prior. On exam, she was oriented x 1, intermittently able to provide a history then becomes less responsive, intermittently  following commands. She had left-sided neglect. She had an MRI brain with and without contrast which I personally reviewed, there was prior right suboccipital craniotomy with encephalomalacia in the right cerebellar hemisphere. There was abnormal T2/FLAIR signal with edema involving predominantly the cortical gray matter of the bilateral temporal occipital regions, right greater than left, with involvement of the mesial right temporal lobe, as well as probable increased signal in the left mesial temporal lobe. Considerations included post-ictal changes, PRES, limbic encephalitis. Her routine wake and sleep EEG was abnormal due to mild diffuse slowing. She had a lumbar puncture with CSF WBC 0, RBC 2, protein 68, glucose 98. HSV, Lyme, West Nile, Enterovirus, gram stain and culture were negative. Serum Lyme and RMSF were negative. Her B12, Hepatitis panel, HIV Ab were negative. TSH elevated at 7.001. Ammonia level initially 77, down to 32 the next day. TPO Ab, NMDA Ab negative.   PAST MEDICAL HISTORY: Past Medical History:  Diagnosis Date  . Anxiety   . Brain cancer [facility])    awaiting pathology; assuming this is a lung primary with brain mets  . Dementia   . GERD (gastroesophageal reflux disease)   . Hypothyroidism   . Left homonymous hemianopsia    secondary to right posterior temporal and occipital lesions.  . Lung cancer [facility])    awaiting pathology  . Thyroid disease   . TMJ (dislocation of temporomandibular joint)     MEDICATIONS: Current Outpatient Prescriptions on File Prior to Visit  Medication Sig Dispense Refill  . gabapentin (NEURONTIN) 600 MG tablet Take 900 mg by mouth at bedtime.     . hydrocortisone (CORTEF) 5 MG tablet  Take 10 mg in am and 5 mg in pm.    . lactobacillus acidophilus (BACID) TABS tablet Take 2 tablets by mouth 3 (three) times daily. 30 tablet 0  . levothyroxine (SYNTHROID, LEVOTHROID) 88 MCG tablet Take 1 tablet (88 mcg total) by mouth daily before breakfast. 30  tablet 0  . loperamide (IMODIUM) 2 MG capsule Take 1-2 capsules (2-4 mg total) by mouth 3 (three) times daily as needed for diarrhea or loose stools (up to 8 mg daily). 30 capsule 0  . LORazepam (ATIVAN) 1 MG tablet Take 1 tablet (1 mg total) by mouth every 12 (twelve) hours as needed for anxiety or sleep. 60 tablet 0  . metoCLOPramide (REGLAN) 5 MG tablet Take 1 tablet (5 mg total) by mouth 3 (three) times daily before meals. 30 tablet 0  . mirtazapine (REMERON) 30 MG tablet Take 30 mg by mouth at bedtime.    . pantoprazole (PROTONIX) 40 MG tablet Take 1 tablet (40 mg total) by mouth daily. 30 tablet 0  . potassium chloride SA (K-DUR,KLOR-CON) 20 MEQ tablet Take 1 tablet (20 mEq total) by mouth daily. 30 tablet 0  . promethazine (PHENERGAN) 25 MG suppository Place 1 suppository (25 mg total) rectally every 6 (six) hours as needed for nausea or vomiting. 12 each 0  . sodium bicarbonate 650 MG tablet Take 1 tablet (650 mg total) by mouth 2 (two) times daily. 30 tablet 0  . UNABLE TO FIND Blood Work: Basic Metabolic Panel in 1 week. Dx: Hypokalemia. Results to PCP: Dr. Olen Pel. 1 each 0   No current facility-administered medications on file prior to visit.     ALLERGIES: Allergies  Allergen Reactions  . Morphine And Related Nausea Only  . Scopolamine Rash    FAMILY HISTORY: Family History  Problem Relation Age of Onset  . Dementia Mother   . Cancer - Other Other     SOCIAL HISTORY: Social History   Social History  . Marital status: Married    Spouse name: N/A  . Number of children: N/A  . Years of education: N/A   Occupational History  . Not on file.   Social History Main Topics  . Smoking status: Former Smoker    Quit date: 11/19/2013  . Smokeless tobacco: Never Used  . Alcohol use No  . Drug use: No  . Sexual activity: Not on file   Other Topics Concern  . Not on file   Social History Narrative  . No narrative on file    REVIEW OF SYSTEMS: Constitutional: No  fevers, chills, or sweats, generalized fatigue, change in appetite Eyes: No visual changes, double vision, eye pain Ear, nose and throat: No hearing loss, ear pain, nasal congestion, sore throat Cardiovascular: No chest pain, palpitations Respiratory:  No shortness of breath at rest or with exertion, wheezes GastrointestinaI: No nausea, vomiting, diarrhea, abdominal pain, fecal incontinence Genitourinary:  No dysuria, urinary retention or frequency Musculoskeletal:  No neck pain, back pain Integumentary: No rash, pruritus, skin lesions Neurological: as above Psychiatric: No depression, insomnia, anxiety Endocrine: No palpitations, fatigue, diaphoresis, mood swings, change in appetite,+change in weight,no increased thirst Hematologic/Lymphatic:  No anemia, purpura, petechiae. Allergic/Immunologic: no itchy/runny eyes, nasal congestion, recent allergic reactions, rashes  PHYSICAL EXAM: Vitals:   08/04/17 1126  BP: 104/68  Pulse: 76   General: No acute distress Head:  Normocephalic, post-surgical changes in the occipital region, well-healed scar Neck: supple, no paraspinal tenderness, full range of motion Heart:  Regular rate and rhythm Lungs:  Clear to auscultation bilaterally Back: No paraspinal tenderness Skin/Extremities: No rash, no edema Neurological Exam: alert and oriented to person, place, and time. No aphasia or dysarthria. Fund of knowledge is appropriate.  Recent and remote memory are intact. 3/3 delayed recall.  Attention and concentration are normal.    Able to name objects and repeat phrases.  Cranial nerves: CN I: not tested CN II: pupils equal, round and reactive to light, left homonymous hemianopia (similar to prior) CN III, IV, VI: full range of motion, no nystagmus, no ptosis CN V: facial sensation intact CN VII: upper and lower face symmetric CN VIII: hearing intact to finger rub CN IX, X: gag intact, uvula midline CN XI: sternocleidomastoid and trapezius  muscles intact CN XII: tongue midline Bulk & Tone: normal, no fasciculations. Motor: 5/5 throughout with no pronator drift. Sensation: intact to light touch. No extinction to double simultaneous stimulation. Romberg test negative Deep Tendon Reflexes: brisk +2 throughout, no ankle clonus Plantar responses: downgoing bilaterally Cerebellar: no incoordination on finger to nose testing. No dysdiadochokinesia Gait: narrow-based and steady, able to tandem walk adequately Tremor: none  IMPRESSION: This is a 56 yo RH woman with a history of metastatic melanoma to the brain s/p suboccipital craniotomy, who had an episode of altered mental status last 03/21/15. Her MRI brain at that time was abnormal with increased signal in the bilateral temporal occipital regions, right greater than left, with involvement of the mesial temporal lobes. This had resolved with subsequent scans, most recent MRI has been stable over time, with right temporal and occipital encephalomalacia. She then had interval scans at [facility] reported as stable. She continues to do interval scans with [facility]. She has improved well neurologically, with only left hemianopsia on exam. No further falls. Continue all current medications. She would like to continue follow-up in our office annually to have a local neurologist. She knows to call for any changes.   Thank you for allowing me to participate in her care.  Please do not hesitate to call for any questions or concerns.  The duration of this appointment visit was 15 minutes of face-to-face time with the patient.  Greater than 50% of this time was spent in counseling, explanation of diagnosis, planning of further management, and coordination of care.   Ellouise Newer, M.D.   CC: Dr. Olen Pel

## 2017-08-29 DIAGNOSIS — R51 Headache: Secondary | ICD-10-CM | POA: Diagnosis not present

## 2017-08-29 DIAGNOSIS — G9389 Other specified disorders of brain: Secondary | ICD-10-CM | POA: Diagnosis not present

## 2017-08-29 DIAGNOSIS — R221 Localized swelling, mass and lump, neck: Secondary | ICD-10-CM | POA: Diagnosis not present

## 2017-08-30 DIAGNOSIS — G4489 Other headache syndrome: Secondary | ICD-10-CM | POA: Diagnosis not present

## 2017-08-30 DIAGNOSIS — R197 Diarrhea, unspecified: Secondary | ICD-10-CM | POA: Diagnosis not present

## 2017-08-30 DIAGNOSIS — E2740 Unspecified adrenocortical insufficiency: Secondary | ICD-10-CM | POA: Diagnosis not present

## 2017-08-30 DIAGNOSIS — C7931 Secondary malignant neoplasm of brain: Secondary | ICD-10-CM | POA: Diagnosis not present

## 2017-08-30 DIAGNOSIS — E039 Hypothyroidism, unspecified: Secondary | ICD-10-CM | POA: Diagnosis not present

## 2017-10-16 DIAGNOSIS — Z08 Encounter for follow-up examination after completed treatment for malignant neoplasm: Secondary | ICD-10-CM | POA: Diagnosis not present

## 2017-10-16 DIAGNOSIS — Z9289 Personal history of other medical treatment: Secondary | ICD-10-CM | POA: Diagnosis not present

## 2017-10-16 DIAGNOSIS — E236 Other disorders of pituitary gland: Secondary | ICD-10-CM | POA: Diagnosis not present

## 2017-10-16 DIAGNOSIS — C439 Malignant melanoma of skin, unspecified: Secondary | ICD-10-CM | POA: Diagnosis not present

## 2017-10-16 DIAGNOSIS — E876 Hypokalemia: Secondary | ICD-10-CM | POA: Diagnosis not present

## 2017-10-16 DIAGNOSIS — R911 Solitary pulmonary nodule: Secondary | ICD-10-CM | POA: Diagnosis not present

## 2017-10-16 DIAGNOSIS — H547 Unspecified visual loss: Secondary | ICD-10-CM | POA: Diagnosis not present

## 2017-10-16 DIAGNOSIS — Z85820 Personal history of malignant melanoma of skin: Secondary | ICD-10-CM | POA: Diagnosis not present

## 2017-10-16 DIAGNOSIS — E273 Drug-induced adrenocortical insufficiency: Secondary | ICD-10-CM | POA: Diagnosis not present

## 2017-10-16 DIAGNOSIS — E039 Hypothyroidism, unspecified: Secondary | ICD-10-CM | POA: Diagnosis not present

## 2017-10-16 DIAGNOSIS — T451X5A Adverse effect of antineoplastic and immunosuppressive drugs, initial encounter: Secondary | ICD-10-CM | POA: Diagnosis not present

## 2017-10-30 DIAGNOSIS — H524 Presbyopia: Secondary | ICD-10-CM | POA: Diagnosis not present

## 2017-10-30 DIAGNOSIS — H5213 Myopia, bilateral: Secondary | ICD-10-CM | POA: Diagnosis not present

## 2017-10-30 DIAGNOSIS — H52209 Unspecified astigmatism, unspecified eye: Secondary | ICD-10-CM | POA: Diagnosis not present

## 2017-11-30 DIAGNOSIS — E038 Other specified hypothyroidism: Secondary | ICD-10-CM | POA: Diagnosis not present

## 2017-11-30 DIAGNOSIS — E063 Autoimmune thyroiditis: Secondary | ICD-10-CM | POA: Diagnosis not present

## 2018-01-01 DIAGNOSIS — R69 Illness, unspecified: Secondary | ICD-10-CM | POA: Diagnosis not present

## 2018-01-11 DIAGNOSIS — G43719 Chronic migraine without aura, intractable, without status migrainosus: Secondary | ICD-10-CM | POA: Diagnosis not present

## 2018-01-11 DIAGNOSIS — R51 Headache: Secondary | ICD-10-CM | POA: Diagnosis not present

## 2018-01-15 DIAGNOSIS — E063 Autoimmune thyroiditis: Secondary | ICD-10-CM | POA: Diagnosis not present

## 2018-01-15 DIAGNOSIS — E038 Other specified hypothyroidism: Secondary | ICD-10-CM | POA: Diagnosis not present

## 2018-01-24 DIAGNOSIS — L989 Disorder of the skin and subcutaneous tissue, unspecified: Secondary | ICD-10-CM | POA: Diagnosis not present

## 2018-01-24 DIAGNOSIS — G4700 Insomnia, unspecified: Secondary | ICD-10-CM | POA: Diagnosis not present

## 2018-02-23 DIAGNOSIS — L723 Sebaceous cyst: Secondary | ICD-10-CM | POA: Diagnosis not present

## 2018-03-28 DIAGNOSIS — R05 Cough: Secondary | ICD-10-CM | POA: Diagnosis not present

## 2018-04-16 DIAGNOSIS — D649 Anemia, unspecified: Secondary | ICD-10-CM | POA: Diagnosis not present

## 2018-04-16 DIAGNOSIS — Z85820 Personal history of malignant melanoma of skin: Secondary | ICD-10-CM | POA: Diagnosis not present

## 2018-04-16 DIAGNOSIS — Z08 Encounter for follow-up examination after completed treatment for malignant neoplasm: Secondary | ICD-10-CM | POA: Diagnosis not present

## 2018-04-16 DIAGNOSIS — E236 Other disorders of pituitary gland: Secondary | ICD-10-CM | POA: Diagnosis not present

## 2018-04-16 DIAGNOSIS — E2740 Unspecified adrenocortical insufficiency: Secondary | ICD-10-CM | POA: Diagnosis not present

## 2018-04-16 DIAGNOSIS — C439 Malignant melanoma of skin, unspecified: Secondary | ICD-10-CM | POA: Diagnosis not present

## 2018-04-16 DIAGNOSIS — E039 Hypothyroidism, unspecified: Secondary | ICD-10-CM | POA: Diagnosis not present

## 2018-04-16 DIAGNOSIS — E876 Hypokalemia: Secondary | ICD-10-CM | POA: Diagnosis not present

## 2018-04-16 DIAGNOSIS — Z9289 Personal history of other medical treatment: Secondary | ICD-10-CM | POA: Diagnosis not present

## 2018-04-16 DIAGNOSIS — H547 Unspecified visual loss: Secondary | ICD-10-CM | POA: Diagnosis not present

## 2018-04-16 DIAGNOSIS — Z87891 Personal history of nicotine dependence: Secondary | ICD-10-CM | POA: Diagnosis not present

## 2018-04-18 DIAGNOSIS — Z681 Body mass index (BMI) 19 or less, adult: Secondary | ICD-10-CM | POA: Diagnosis not present

## 2018-04-18 DIAGNOSIS — Z87891 Personal history of nicotine dependence: Secondary | ICD-10-CM | POA: Diagnosis not present

## 2018-04-18 DIAGNOSIS — R636 Underweight: Secondary | ICD-10-CM | POA: Diagnosis not present

## 2018-04-18 DIAGNOSIS — E237 Disorder of pituitary gland, unspecified: Secondary | ICD-10-CM | POA: Diagnosis not present

## 2018-04-18 DIAGNOSIS — G4700 Insomnia, unspecified: Secondary | ICD-10-CM | POA: Diagnosis not present

## 2018-04-18 DIAGNOSIS — Z85820 Personal history of malignant melanoma of skin: Secondary | ICD-10-CM | POA: Diagnosis not present

## 2018-04-18 DIAGNOSIS — E039 Hypothyroidism, unspecified: Secondary | ICD-10-CM | POA: Diagnosis not present

## 2018-04-18 DIAGNOSIS — Z85841 Personal history of malignant neoplasm of brain: Secondary | ICD-10-CM | POA: Diagnosis not present

## 2018-04-18 DIAGNOSIS — G43909 Migraine, unspecified, not intractable, without status migrainosus: Secondary | ICD-10-CM | POA: Diagnosis not present

## 2018-04-18 DIAGNOSIS — R69 Illness, unspecified: Secondary | ICD-10-CM | POA: Diagnosis not present

## 2018-05-07 DIAGNOSIS — C439 Malignant melanoma of skin, unspecified: Secondary | ICD-10-CM | POA: Diagnosis not present

## 2018-05-07 DIAGNOSIS — E2740 Unspecified adrenocortical insufficiency: Secondary | ICD-10-CM | POA: Diagnosis not present

## 2018-05-07 DIAGNOSIS — Z9289 Personal history of other medical treatment: Secondary | ICD-10-CM | POA: Diagnosis not present

## 2018-06-12 DIAGNOSIS — E2749 Other adrenocortical insufficiency: Secondary | ICD-10-CM | POA: Diagnosis not present

## 2018-06-12 DIAGNOSIS — E876 Hypokalemia: Secondary | ICD-10-CM | POA: Diagnosis not present

## 2018-06-12 DIAGNOSIS — E038 Other specified hypothyroidism: Secondary | ICD-10-CM | POA: Diagnosis not present

## 2018-06-12 DIAGNOSIS — R69 Illness, unspecified: Secondary | ICD-10-CM | POA: Diagnosis not present

## 2018-06-12 DIAGNOSIS — E063 Autoimmune thyroiditis: Secondary | ICD-10-CM | POA: Diagnosis not present

## 2018-06-21 DIAGNOSIS — R69 Illness, unspecified: Secondary | ICD-10-CM | POA: Diagnosis not present

## 2018-06-25 DIAGNOSIS — R69 Illness, unspecified: Secondary | ICD-10-CM | POA: Diagnosis not present

## 2018-07-16 DIAGNOSIS — R51 Headache: Secondary | ICD-10-CM | POA: Diagnosis not present

## 2018-07-16 DIAGNOSIS — G43719 Chronic migraine without aura, intractable, without status migrainosus: Secondary | ICD-10-CM | POA: Diagnosis not present

## 2018-08-01 DIAGNOSIS — R69 Illness, unspecified: Secondary | ICD-10-CM | POA: Diagnosis not present

## 2018-08-06 ENCOUNTER — Other Ambulatory Visit: Payer: Self-pay

## 2018-08-06 ENCOUNTER — Encounter: Payer: Self-pay | Admitting: Neurology

## 2018-08-06 ENCOUNTER — Ambulatory Visit (INDEPENDENT_AMBULATORY_CARE_PROVIDER_SITE_OTHER): Payer: Medicare HMO | Admitting: Neurology

## 2018-08-06 VITALS — BP 112/74 | HR 81 | Ht 67.0 in | Wt 125.0 lb

## 2018-08-06 DIAGNOSIS — C439 Malignant melanoma of skin, unspecified: Secondary | ICD-10-CM

## 2018-08-06 DIAGNOSIS — C799 Secondary malignant neoplasm of unspecified site: Secondary | ICD-10-CM

## 2018-08-06 DIAGNOSIS — H53462 Homonymous bilateral field defects, left side: Secondary | ICD-10-CM | POA: Diagnosis not present

## 2018-08-06 NOTE — Patient Instructions (Signed)
Looking good! Continue all your medications. Keep healthy and safe! Follow-up as needed, call for any changes

## 2018-08-06 NOTE — Progress Notes (Signed)
NEUROLOGY FOLLOW UP OFFICE NOTE  [name] [name] [id number]  DOB: 1961/02/04  HISTORY OF PRESENT ILLNESS: I had the pleasure of seeing [name] [name] in follow-up in the neurology clinic on 08/06/2018.  The patient was last seen a year ago when she presented after hospitalization for an episode of altered mental status in April 2016 with abnormal brain MRI, considerations included post-ictal changes, PRES, limbic encephalitis. CSF studies normal. She continues to follow-up regularly with [facility] and is happy to report that her most recent imaging studies have been good, her MRI brain studies are now scheduled a year apart, next MRI scheduled for November 2019. She has residual left homonymous hemianopia. She sees Dr. Domingo Cocking at the [facility] and reports good response to gabapentin 900mg  qhs, with headaches around once a week. She has stopped lorazepam at night. She reports overall doing well, no dizziness, diplopia, focal numbness/tingling/weakness, no falls.   HPI: This is a 57 yo RH woman with a history of metastatic melanoma with brain metastasis s/p craniotomy, with negative MRI brain scans at [facility], in her usual state of health until 03/21/15 when she presented to [facility] ER with altered mental status. She and her husband went camping from 4/13 to 4/17. She recalls having a bad headache and was up all night. She did not recall much of the drive back, but apparently her husband had difficulty arousing her, then she was just looking at him, babbling, diaphoretic. She was brought to [facility] ER where she was evaluated by Neurology, per notes she would have intermittent periods where she would become lucid, then become non-verbal with eyes staring off to either side. There is note that she had diarrhea and nausea the day prior. On exam, she was oriented x 1, intermittently able to provide a history then becomes less responsive, intermittently following commands. She had left-sided  neglect. She had an MRI brain with and without contrast which I personally reviewed, there was prior right suboccipital craniotomy with encephalomalacia in the right cerebellar hemisphere. There was abnormal T2/FLAIR signal with edema involving predominantly the cortical gray matter of the bilateral temporal occipital regions, right greater than left, with involvement of the mesial right temporal lobe, as well as probable increased signal in the left mesial temporal lobe. Considerations included post-ictal changes, PRES, limbic encephalitis. Her routine wake and sleep EEG was abnormal due to mild diffuse slowing. She had a lumbar puncture with CSF WBC 0, RBC 2, protein 68, glucose 98. HSV, Lyme, West Nile, Enterovirus, gram stain and culture were negative. Serum Lyme and RMSF were negative. Her B12, Hepatitis panel, HIV Ab were negative. TSH elevated at 7.001. Ammonia level initially 77, down to 32 the next day. TPO Ab, NMDA Ab negative.   PAST MEDICAL HISTORY: Past Medical History:  Diagnosis Date  . Anxiety   . Brain cancer [facility])    awaiting pathology; assuming this is a lung primary with brain mets  . Dementia   . GERD (gastroesophageal reflux disease)   . Hypothyroidism   . Left homonymous hemianopsia    secondary to right posterior temporal and occipital lesions.  . Lung cancer [facility])    awaiting pathology  . Thyroid disease   . TMJ (dislocation of temporomandibular joint)     MEDICATIONS: Current Outpatient Medications on File Prior to Visit  Medication Sig Dispense Refill  . gabapentin (NEURONTIN) 600 MG tablet Take 900 mg by mouth at bedtime.     . hydrocortisone (CORTEF) 5 MG tablet  Take 10 mg in am and 5 mg in pm.    . levothyroxine (SYNTHROID, LEVOTHROID) 75 MCG tablet Take 75 mcg by mouth daily before breakfast.    . metoCLOPramide (REGLAN) 5 MG tablet Take 1 tablet (5 mg total) by mouth 3 (three) times daily before meals. 30 tablet 0  . mirtazapine (REMERON) 30 MG tablet Take 30  mg by mouth at bedtime.    . pantoprazole (PROTONIX) 40 MG tablet Take 1 tablet (40 mg total) by mouth daily. 30 tablet 0  . potassium chloride SA (K-DUR,KLOR-CON) 20 MEQ tablet Take 1 tablet (20 mEq total) by mouth daily. 30 tablet 0  . promethazine (PHENERGAN) 25 MG suppository Place 1 suppository (25 mg total) rectally every 6 (six) hours as needed for nausea or vomiting. 12 each 0  . UNABLE TO FIND Blood Work: Basic Metabolic Panel in 1 week. Dx: Hypokalemia. Results to PCP: Dr. Olen Pel. 1 each 0   No current facility-administered medications on file prior to visit.     ALLERGIES: Allergies  Allergen Reactions  . Morphine And Related Nausea Only  . Scopolamine Rash    FAMILY HISTORY: Family History  Problem Relation Age of Onset  . Dementia Mother   . Cancer - Other Other     SOCIAL HISTORY: Social History   Socioeconomic History  . Marital status: Married    Spouse name: Not on file  . Number of children: Not on file  . Years of education: Not on file  . Highest education level: Not on file  Occupational History  . Not on file  Social Needs  . Financial resource strain: Not on file  . Food insecurity:    Worry: Not on file    Inability: Not on file  . Transportation needs:    Medical: Not on file    Non-medical: Not on file  Tobacco Use  . Smoking status: Former Smoker    Last attempt to quit: 11/19/2013    Years since quitting: 4.7  . Smokeless tobacco: Never Used  Substance and Sexual Activity  . Alcohol use: No    Alcohol/week: 0.0 standard drinks  . Drug use: No  . Sexual activity: Not on file  Lifestyle  . Physical activity:    Days per week: Not on file    Minutes per session: Not on file  . Stress: Not on file  Relationships  . Social connections:    Talks on phone: Not on file    Gets together: Not on file    Attends religious service: Not on file    Active member of club or organization: Not on file    Attends meetings of clubs or  organizations: Not on file    Relationship status: Not on file  . Intimate partner violence:    Fear of current or ex partner: Not on file    Emotionally abused: Not on file    Physically abused: Not on file    Forced sexual activity: Not on file  Other Topics Concern  . Not on file  Social History Narrative  . Not on file    REVIEW OF SYSTEMS: Constitutional: No fevers, chills, or sweats, generalized fatigue, change in appetite Eyes: No visual changes, double vision, eye pain Ear, nose and throat: No hearing loss, ear pain, nasal congestion, sore throat Cardiovascular: No chest pain, palpitations Respiratory:  No shortness of breath at rest or with exertion, wheezes GastrointestinaI: No nausea, vomiting, diarrhea, abdominal pain, fecal incontinence Genitourinary:  No dysuria, urinary retention or frequency Musculoskeletal:  No neck pain, back pain Integumentary: No rash, pruritus, skin lesions Neurological: as above Psychiatric: No depression, insomnia, anxiety Endocrine: No palpitations, fatigue, diaphoresis, mood swings, change in appetite,+change in weight,no increased thirst Hematologic/Lymphatic:  No anemia, purpura, petechiae. Allergic/Immunologic: no itchy/runny eyes, nasal congestion, recent allergic reactions, rashes  PHYSICAL EXAM: Vitals:   08/06/18 1023  BP: 112/74  Pulse: 81  SpO2: 98%   General: No acute distress Head:  Normocephalic, post-surgical changes in the occipital region, well-healed scar (similar to prior) Neck: supple, no paraspinal tenderness, full range of motion Heart:  Regular rate and rhythm Lungs:  Clear to auscultation bilaterally Back: No paraspinal tenderness Skin/Extremities: No rash, no edema Neurological Exam: alert and oriented to person, place, and time. No aphasia or dysarthria. Fund of knowledge is appropriate.  Recent and remote memory are intact.Attention and concentration are normal.    Able to name objects and repeat phrases.    Cranial nerves: CN I: not tested CN II: pupils equal, round and reactive to light, left homonymous hemianopia (unchanged) CN III, IV, VI: full range of motion, no nystagmus, no ptosis CN V: facial sensation intact CN VII: upper and lower face symmetric CN VIII: hearing intact to finger rub CN IX, X: gag intact, uvula midline CN XI: sternocleidomastoid and trapezius muscles intact CN XII: tongue midline Bulk & Tone: normal, no fasciculations. Motor: 5/5 throughout with no pronator drift. Sensation: intact to light touch. No extinction to double simultaneous stimulation. Romberg test negative Deep Tendon Reflexes: brisk +2 throughout, no ankle clonus Plantar responses: downgoing bilaterally Cerebellar: no incoordination on finger to nose testing. Gait: narrow-based and steady, mild difficulty with tandem walk Tremor: none  IMPRESSION: This is a 57 yo RH woman with a history of metastatic melanoma to the brain s/p suboccipital craniotomy, who had an episode of altered mental status last 03/21/15. Her MRI brain at that time was abnormal with increased signal in the bilateral temporal occipital regions, right greater than left, with involvement of the mesial temporal lobes. This had resolved with subsequent scans, most recent MRI has been stable over time, with right temporal and occipital encephalomalacia. She is scheduled for another brain scan in November 2019 with [facility]. She continues to do well neurologically, with residual left hemianopsia. She sees Dr. Domingo Cocking for the headaches. She will follow-up on a prn basis and knows to call for any changes.   Thank you for allowing me to participate in her care.  Please do not hesitate to call for any questions or concerns.  The duration of this appointment visit was 15 minutes of face-to-face time with the patient.  Greater than 50% of this time was spent in counseling, explanation of diagnosis, planning of further management, and  coordination of care.   Ellouise Newer, M.D.   CC: Dr. Olen Pel

## 2018-09-14 DIAGNOSIS — R110 Nausea: Secondary | ICD-10-CM | POA: Diagnosis not present

## 2018-09-14 DIAGNOSIS — R69 Illness, unspecified: Secondary | ICD-10-CM | POA: Diagnosis not present

## 2018-09-14 DIAGNOSIS — E2740 Unspecified adrenocortical insufficiency: Secondary | ICD-10-CM | POA: Diagnosis not present

## 2018-09-14 DIAGNOSIS — R64 Cachexia: Secondary | ICD-10-CM | POA: Diagnosis not present

## 2018-09-14 DIAGNOSIS — C439 Malignant melanoma of skin, unspecified: Secondary | ICD-10-CM | POA: Diagnosis not present

## 2018-09-14 DIAGNOSIS — Z515 Encounter for palliative care: Secondary | ICD-10-CM | POA: Diagnosis not present

## 2018-10-15 DIAGNOSIS — Z08 Encounter for follow-up examination after completed treatment for malignant neoplasm: Secondary | ICD-10-CM | POA: Diagnosis not present

## 2018-10-15 DIAGNOSIS — E039 Hypothyroidism, unspecified: Secondary | ICD-10-CM | POA: Diagnosis not present

## 2018-10-15 DIAGNOSIS — Z85820 Personal history of malignant melanoma of skin: Secondary | ICD-10-CM | POA: Diagnosis not present

## 2018-10-15 DIAGNOSIS — C439 Malignant melanoma of skin, unspecified: Secondary | ICD-10-CM | POA: Diagnosis not present

## 2018-10-15 DIAGNOSIS — C7931 Secondary malignant neoplasm of brain: Secondary | ICD-10-CM | POA: Diagnosis not present

## 2018-10-15 DIAGNOSIS — Z87891 Personal history of nicotine dependence: Secondary | ICD-10-CM | POA: Diagnosis not present

## 2018-10-15 DIAGNOSIS — E2740 Unspecified adrenocortical insufficiency: Secondary | ICD-10-CM | POA: Diagnosis not present

## 2018-10-15 DIAGNOSIS — E876 Hypokalemia: Secondary | ICD-10-CM | POA: Diagnosis not present

## 2018-10-15 DIAGNOSIS — H547 Unspecified visual loss: Secondary | ICD-10-CM | POA: Diagnosis not present

## 2018-12-07 DIAGNOSIS — C439 Malignant melanoma of skin, unspecified: Secondary | ICD-10-CM | POA: Diagnosis not present

## 2018-12-07 DIAGNOSIS — E2740 Unspecified adrenocortical insufficiency: Secondary | ICD-10-CM | POA: Diagnosis not present

## 2018-12-07 DIAGNOSIS — Z515 Encounter for palliative care: Secondary | ICD-10-CM | POA: Diagnosis not present

## 2018-12-07 DIAGNOSIS — R110 Nausea: Secondary | ICD-10-CM | POA: Diagnosis not present

## 2018-12-07 DIAGNOSIS — R69 Illness, unspecified: Secondary | ICD-10-CM | POA: Diagnosis not present

## 2018-12-13 DIAGNOSIS — E063 Autoimmune thyroiditis: Secondary | ICD-10-CM | POA: Diagnosis not present

## 2018-12-13 DIAGNOSIS — E038 Other specified hypothyroidism: Secondary | ICD-10-CM | POA: Diagnosis not present

## 2018-12-13 DIAGNOSIS — E2740 Unspecified adrenocortical insufficiency: Secondary | ICD-10-CM | POA: Diagnosis not present

## 2018-12-26 DIAGNOSIS — R69 Illness, unspecified: Secondary | ICD-10-CM | POA: Diagnosis not present

## 2019-03-01 DIAGNOSIS — R51 Headache: Secondary | ICD-10-CM | POA: Diagnosis not present

## 2019-03-01 DIAGNOSIS — C439 Malignant melanoma of skin, unspecified: Secondary | ICD-10-CM | POA: Diagnosis not present

## 2019-03-01 DIAGNOSIS — R69 Illness, unspecified: Secondary | ICD-10-CM | POA: Diagnosis not present

## 2019-03-22 DIAGNOSIS — R6889 Other general symptoms and signs: Secondary | ICD-10-CM | POA: Diagnosis not present

## 2019-03-22 DIAGNOSIS — Z1239 Encounter for other screening for malignant neoplasm of breast: Secondary | ICD-10-CM | POA: Diagnosis not present

## 2019-03-26 ENCOUNTER — Other Ambulatory Visit: Payer: Self-pay | Admitting: Family Medicine

## 2019-03-26 DIAGNOSIS — Z1231 Encounter for screening mammogram for malignant neoplasm of breast: Secondary | ICD-10-CM

## 2019-04-15 DIAGNOSIS — E2740 Unspecified adrenocortical insufficiency: Secondary | ICD-10-CM | POA: Diagnosis not present

## 2019-04-15 DIAGNOSIS — Z87891 Personal history of nicotine dependence: Secondary | ICD-10-CM | POA: Diagnosis not present

## 2019-04-15 DIAGNOSIS — Z85841 Personal history of malignant neoplasm of brain: Secondary | ICD-10-CM | POA: Diagnosis not present

## 2019-04-15 DIAGNOSIS — Z9289 Personal history of other medical treatment: Secondary | ICD-10-CM | POA: Diagnosis not present

## 2019-04-15 DIAGNOSIS — Z923 Personal history of irradiation: Secondary | ICD-10-CM | POA: Diagnosis not present

## 2019-04-15 DIAGNOSIS — Z85118 Personal history of other malignant neoplasm of bronchus and lung: Secondary | ICD-10-CM | POA: Diagnosis not present

## 2019-04-15 DIAGNOSIS — Z08 Encounter for follow-up examination after completed treatment for malignant neoplasm: Secondary | ICD-10-CM | POA: Diagnosis not present

## 2019-04-15 DIAGNOSIS — E039 Hypothyroidism, unspecified: Secondary | ICD-10-CM | POA: Diagnosis not present

## 2019-04-15 DIAGNOSIS — H547 Unspecified visual loss: Secondary | ICD-10-CM | POA: Diagnosis not present

## 2019-04-15 DIAGNOSIS — C7800 Secondary malignant neoplasm of unspecified lung: Secondary | ICD-10-CM | POA: Diagnosis not present

## 2019-04-15 DIAGNOSIS — C7931 Secondary malignant neoplasm of brain: Secondary | ICD-10-CM | POA: Diagnosis not present

## 2019-04-15 DIAGNOSIS — T451X5A Adverse effect of antineoplastic and immunosuppressive drugs, initial encounter: Secondary | ICD-10-CM | POA: Diagnosis not present

## 2019-04-15 DIAGNOSIS — C439 Malignant melanoma of skin, unspecified: Secondary | ICD-10-CM | POA: Diagnosis not present

## 2019-04-16 DIAGNOSIS — G43719 Chronic migraine without aura, intractable, without status migrainosus: Secondary | ICD-10-CM | POA: Diagnosis not present

## 2019-04-16 DIAGNOSIS — R51 Headache: Secondary | ICD-10-CM | POA: Diagnosis not present

## 2019-06-05 DIAGNOSIS — R112 Nausea with vomiting, unspecified: Secondary | ICD-10-CM | POA: Diagnosis not present

## 2019-06-05 DIAGNOSIS — Z515 Encounter for palliative care: Secondary | ICD-10-CM | POA: Diagnosis not present

## 2019-06-05 DIAGNOSIS — Z9289 Personal history of other medical treatment: Secondary | ICD-10-CM | POA: Diagnosis not present

## 2019-06-05 DIAGNOSIS — C799 Secondary malignant neoplasm of unspecified site: Secondary | ICD-10-CM | POA: Diagnosis not present

## 2019-06-05 DIAGNOSIS — C7931 Secondary malignant neoplasm of brain: Secondary | ICD-10-CM | POA: Diagnosis not present

## 2019-06-05 DIAGNOSIS — R69 Illness, unspecified: Secondary | ICD-10-CM | POA: Diagnosis not present

## 2019-06-13 DIAGNOSIS — E038 Other specified hypothyroidism: Secondary | ICD-10-CM | POA: Diagnosis not present

## 2019-06-13 DIAGNOSIS — E2740 Unspecified adrenocortical insufficiency: Secondary | ICD-10-CM | POA: Diagnosis not present

## 2019-06-13 DIAGNOSIS — E876 Hypokalemia: Secondary | ICD-10-CM | POA: Diagnosis not present

## 2019-06-13 DIAGNOSIS — E063 Autoimmune thyroiditis: Secondary | ICD-10-CM | POA: Diagnosis not present

## 2019-07-10 DIAGNOSIS — E876 Hypokalemia: Secondary | ICD-10-CM | POA: Diagnosis not present

## 2019-07-10 DIAGNOSIS — E063 Autoimmune thyroiditis: Secondary | ICD-10-CM | POA: Diagnosis not present

## 2019-07-10 DIAGNOSIS — E038 Other specified hypothyroidism: Secondary | ICD-10-CM | POA: Diagnosis not present

## 2019-07-10 DIAGNOSIS — E2740 Unspecified adrenocortical insufficiency: Secondary | ICD-10-CM | POA: Diagnosis not present

## 2019-07-16 DIAGNOSIS — R69 Illness, unspecified: Secondary | ICD-10-CM | POA: Diagnosis not present

## 2019-07-31 DIAGNOSIS — R69 Illness, unspecified: Secondary | ICD-10-CM | POA: Diagnosis not present

## 2019-08-02 ENCOUNTER — Other Ambulatory Visit: Payer: Self-pay

## 2019-08-02 ENCOUNTER — Ambulatory Visit
Admission: RE | Admit: 2019-08-02 | Discharge: 2019-08-02 | Disposition: A | Discharge status: Home / Self Care | Payer: Medicare HMO | Source: Ambulatory Visit | Attending: Family Medicine | Admitting: Family Medicine

## 2019-08-02 DIAGNOSIS — Z1231 Encounter for screening mammogram for malignant neoplasm of breast: Secondary | ICD-10-CM | POA: Diagnosis not present

## 2019-10-23 ENCOUNTER — Other Ambulatory Visit: Payer: Self-pay

## 2019-10-23 ENCOUNTER — Encounter: Payer: Self-pay | Admitting: Neurology

## 2019-10-23 ENCOUNTER — Telehealth (INDEPENDENT_AMBULATORY_CARE_PROVIDER_SITE_OTHER): Payer: Medicare HMO | Admitting: Neurology

## 2019-10-23 ENCOUNTER — Telehealth: Payer: Self-pay

## 2019-10-23 VITALS — Ht 67.0 in | Wt 120.0 lb

## 2019-10-23 DIAGNOSIS — H53462 Homonymous bilateral field defects, left side: Secondary | ICD-10-CM

## 2019-10-23 DIAGNOSIS — C799 Secondary malignant neoplasm of unspecified site: Secondary | ICD-10-CM

## 2019-10-23 DIAGNOSIS — C439 Malignant melanoma of skin, unspecified: Secondary | ICD-10-CM

## 2019-10-23 NOTE — Telephone Encounter (Signed)
Faxed referral form along with last ov note, ins info, and demos to [place] location today. Fax [phone]
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
## 2019-11-13 ENCOUNTER — Ambulatory Visit: Payer: Medicare HMO

## 2019-11-19 DIAGNOSIS — G43719 Chronic migraine without aura, intractable, without status migrainosus: Secondary | ICD-10-CM | POA: Diagnosis not present

## 2019-12-20 DIAGNOSIS — E063 Autoimmune thyroiditis: Secondary | ICD-10-CM | POA: Diagnosis not present

## 2019-12-20 DIAGNOSIS — E876 Hypokalemia: Secondary | ICD-10-CM | POA: Diagnosis not present

## 2019-12-20 DIAGNOSIS — E2740 Unspecified adrenocortical insufficiency: Secondary | ICD-10-CM | POA: Diagnosis not present

## 2019-12-20 DIAGNOSIS — E038 Other specified hypothyroidism: Secondary | ICD-10-CM | POA: Diagnosis not present

## 2020-01-15 DIAGNOSIS — R69 Illness, unspecified: Secondary | ICD-10-CM | POA: Diagnosis not present

## 2020-02-04 DIAGNOSIS — Z85118 Personal history of other malignant neoplasm of bronchus and lung: Secondary | ICD-10-CM | POA: Diagnosis not present

## 2020-02-04 DIAGNOSIS — G8929 Other chronic pain: Secondary | ICD-10-CM | POA: Diagnosis not present

## 2020-02-04 DIAGNOSIS — R69 Illness, unspecified: Secondary | ICD-10-CM | POA: Diagnosis not present

## 2020-02-04 DIAGNOSIS — Z7952 Long term (current) use of systemic steroids: Secondary | ICD-10-CM | POA: Diagnosis not present

## 2020-02-04 DIAGNOSIS — E039 Hypothyroidism, unspecified: Secondary | ICD-10-CM | POA: Diagnosis not present

## 2020-02-04 DIAGNOSIS — C7800 Secondary malignant neoplasm of unspecified lung: Secondary | ICD-10-CM | POA: Diagnosis not present

## 2020-02-04 DIAGNOSIS — E2740 Unspecified adrenocortical insufficiency: Secondary | ICD-10-CM | POA: Diagnosis not present

## 2020-02-04 DIAGNOSIS — C439 Malignant melanoma of skin, unspecified: Secondary | ICD-10-CM | POA: Diagnosis not present

## 2020-02-04 DIAGNOSIS — C719 Malignant neoplasm of brain, unspecified: Secondary | ICD-10-CM | POA: Diagnosis not present

## 2020-02-04 DIAGNOSIS — G43909 Migraine, unspecified, not intractable, without status migrainosus: Secondary | ICD-10-CM | POA: Diagnosis not present

## 2020-02-19 DIAGNOSIS — R69 Illness, unspecified: Secondary | ICD-10-CM | POA: Diagnosis not present

## 2020-03-24 DIAGNOSIS — H524 Presbyopia: Secondary | ICD-10-CM | POA: Diagnosis not present

## 2020-03-24 DIAGNOSIS — Z135 Encounter for screening for eye and ear disorders: Secondary | ICD-10-CM | POA: Diagnosis not present

## 2020-04-13 DIAGNOSIS — C439 Malignant melanoma of skin, unspecified: Secondary | ICD-10-CM | POA: Diagnosis not present

## 2020-04-13 DIAGNOSIS — E273 Drug-induced adrenocortical insufficiency: Secondary | ICD-10-CM | POA: Diagnosis not present

## 2020-04-13 DIAGNOSIS — E039 Hypothyroidism, unspecified: Secondary | ICD-10-CM | POA: Diagnosis not present

## 2020-04-13 DIAGNOSIS — T451X5D Adverse effect of antineoplastic and immunosuppressive drugs, subsequent encounter: Secondary | ICD-10-CM | POA: Diagnosis not present

## 2020-04-13 DIAGNOSIS — Z85820 Personal history of malignant melanoma of skin: Secondary | ICD-10-CM | POA: Diagnosis not present

## 2020-04-13 DIAGNOSIS — Z08 Encounter for follow-up examination after completed treatment for malignant neoplasm: Secondary | ICD-10-CM | POA: Diagnosis not present

## 2020-04-13 DIAGNOSIS — Z9225 Personal history of immunosupression therapy: Secondary | ICD-10-CM | POA: Diagnosis not present

## 2020-04-13 DIAGNOSIS — G939 Disorder of brain, unspecified: Secondary | ICD-10-CM | POA: Diagnosis not present

## 2020-04-13 DIAGNOSIS — H547 Unspecified visual loss: Secondary | ICD-10-CM | POA: Diagnosis not present

## 2020-04-13 DIAGNOSIS — R911 Solitary pulmonary nodule: Secondary | ICD-10-CM | POA: Diagnosis not present

## 2020-04-13 DIAGNOSIS — Z902 Acquired absence of lung [part of]: Secondary | ICD-10-CM | POA: Diagnosis not present

## 2020-04-13 DIAGNOSIS — M899 Disorder of bone, unspecified: Secondary | ICD-10-CM | POA: Diagnosis not present

## 2020-04-22 DIAGNOSIS — H5213 Myopia, bilateral: Secondary | ICD-10-CM | POA: Diagnosis not present

## 2020-04-22 DIAGNOSIS — H524 Presbyopia: Secondary | ICD-10-CM | POA: Diagnosis not present

## 2020-04-22 DIAGNOSIS — H52209 Unspecified astigmatism, unspecified eye: Secondary | ICD-10-CM | POA: Diagnosis not present

## 2020-05-12 DIAGNOSIS — M899 Disorder of bone, unspecified: Secondary | ICD-10-CM | POA: Diagnosis not present

## 2020-05-12 DIAGNOSIS — R911 Solitary pulmonary nodule: Secondary | ICD-10-CM | POA: Diagnosis not present

## 2020-05-12 DIAGNOSIS — Z87898 Personal history of other specified conditions: Secondary | ICD-10-CM | POA: Diagnosis not present

## 2020-05-19 DIAGNOSIS — G43719 Chronic migraine without aura, intractable, without status migrainosus: Secondary | ICD-10-CM | POA: Diagnosis not present

## 2020-05-19 DIAGNOSIS — R519 Headache, unspecified: Secondary | ICD-10-CM | POA: Diagnosis not present

## 2020-08-10 DIAGNOSIS — R69 Illness, unspecified: Secondary | ICD-10-CM | POA: Diagnosis not present

## 2020-08-11 DIAGNOSIS — E063 Autoimmune thyroiditis: Secondary | ICD-10-CM | POA: Diagnosis not present

## 2020-08-11 DIAGNOSIS — Z1159 Encounter for screening for other viral diseases: Secondary | ICD-10-CM | POA: Diagnosis not present

## 2020-08-11 DIAGNOSIS — E038 Other specified hypothyroidism: Secondary | ICD-10-CM | POA: Diagnosis not present

## 2020-08-11 DIAGNOSIS — R69 Illness, unspecified: Secondary | ICD-10-CM | POA: Diagnosis not present

## 2020-08-11 DIAGNOSIS — E876 Hypokalemia: Secondary | ICD-10-CM | POA: Diagnosis not present

## 2020-08-11 DIAGNOSIS — Z114 Encounter for screening for human immunodeficiency virus [HIV]: Secondary | ICD-10-CM | POA: Diagnosis not present

## 2020-08-11 DIAGNOSIS — E2740 Unspecified adrenocortical insufficiency: Secondary | ICD-10-CM | POA: Diagnosis not present

## 2020-08-11 DIAGNOSIS — Z7952 Long term (current) use of systemic steroids: Secondary | ICD-10-CM | POA: Diagnosis not present

## 2020-10-01 DIAGNOSIS — L237 Allergic contact dermatitis due to plants, except food: Secondary | ICD-10-CM | POA: Diagnosis not present

## 2020-10-12 DIAGNOSIS — E039 Hypothyroidism, unspecified: Secondary | ICD-10-CM | POA: Diagnosis not present

## 2020-10-12 DIAGNOSIS — E236 Other disorders of pituitary gland: Secondary | ICD-10-CM | POA: Diagnosis not present

## 2020-10-12 DIAGNOSIS — Z85841 Personal history of malignant neoplasm of brain: Secondary | ICD-10-CM | POA: Diagnosis not present

## 2020-10-12 DIAGNOSIS — E273 Drug-induced adrenocortical insufficiency: Secondary | ICD-10-CM | POA: Diagnosis not present

## 2020-10-12 DIAGNOSIS — E038 Other specified hypothyroidism: Secondary | ICD-10-CM | POA: Diagnosis not present

## 2020-10-12 DIAGNOSIS — Z79890 Hormone replacement therapy (postmenopausal): Secondary | ICD-10-CM | POA: Diagnosis not present

## 2020-10-12 DIAGNOSIS — Z9889 Other specified postprocedural states: Secondary | ICD-10-CM | POA: Diagnosis not present

## 2020-10-12 DIAGNOSIS — C439 Malignant melanoma of skin, unspecified: Secondary | ICD-10-CM | POA: Diagnosis not present

## 2020-10-12 DIAGNOSIS — Z85118 Personal history of other malignant neoplasm of bronchus and lung: Secondary | ICD-10-CM | POA: Diagnosis not present

## 2020-10-12 DIAGNOSIS — C7931 Secondary malignant neoplasm of brain: Secondary | ICD-10-CM | POA: Diagnosis not present

## 2020-10-12 DIAGNOSIS — C7989 Secondary malignant neoplasm of other specified sites: Secondary | ICD-10-CM | POA: Diagnosis not present

## 2020-10-12 DIAGNOSIS — Z85820 Personal history of malignant melanoma of skin: Secondary | ICD-10-CM | POA: Diagnosis not present

## 2020-10-12 DIAGNOSIS — R911 Solitary pulmonary nodule: Secondary | ICD-10-CM | POA: Diagnosis not present

## 2020-10-12 DIAGNOSIS — R918 Other nonspecific abnormal finding of lung field: Secondary | ICD-10-CM | POA: Diagnosis not present

## 2020-10-12 DIAGNOSIS — E2740 Unspecified adrenocortical insufficiency: Secondary | ICD-10-CM | POA: Diagnosis not present

## 2020-10-12 DIAGNOSIS — Z9289 Personal history of other medical treatment: Secondary | ICD-10-CM | POA: Diagnosis not present

## 2020-10-12 DIAGNOSIS — T451X5A Adverse effect of antineoplastic and immunosuppressive drugs, initial encounter: Secondary | ICD-10-CM | POA: Diagnosis not present

## 2020-10-12 DIAGNOSIS — E063 Autoimmune thyroiditis: Secondary | ICD-10-CM | POA: Diagnosis not present

## 2020-10-27 ENCOUNTER — Other Ambulatory Visit: Payer: Self-pay

## 2020-10-27 ENCOUNTER — Ambulatory Visit (INDEPENDENT_AMBULATORY_CARE_PROVIDER_SITE_OTHER): Payer: Medicare HMO

## 2020-10-27 ENCOUNTER — Ambulatory Visit (INDEPENDENT_AMBULATORY_CARE_PROVIDER_SITE_OTHER): Payer: Medicare HMO | Admitting: Family Medicine

## 2020-10-27 ENCOUNTER — Encounter: Payer: Self-pay | Admitting: Family Medicine

## 2020-10-27 DIAGNOSIS — M25552 Pain in left hip: Secondary | ICD-10-CM

## 2020-10-27 MED ORDER — BACLOFEN 10 MG PO TABS: 5.0000 mg | ORAL_TABLET | Freq: Three times a day (TID) | ORAL | 3 refills | Status: DC | PRN

## 2020-10-27 NOTE — Progress Notes (Signed)
Office Visit Note   Patient: [name] [name]           Date of Birth: [birth year]           MRN: [id number] Visit Date: 10/27/2020 Requested by: Orpah Melter, MD [address] [place],  [place] [address] PCP: Orpah Melter, MD  Subjective: Chief Complaint  Patient presents with  . Left Hip - Pain    Left posterior hip/buttock pain. Feels like the bones in the hip grind with walking. This started 5 days ago while walking the dog.    HPI: She is here with left hip pain.  Symptoms started about 5 days ago while walking her dog.  She started noticing a grinding sensation in her hip, and she has had pain on the posterior lateral aspect since then.  It hurts when transitioning from lying to sitting, and sitting to standing.  She has not have much groin pain.  She is not taking anything for her pain.  No previous problems with her hip.  No numbness or tingling in her leg.  She does have a history of brain cancer 7 years ago treated successfully.                ROS:   All other systems were reviewed and are negative.  Objective: Vital Signs: There were no vitals taken for this visit.  Physical Exam:  General:  Alert and oriented, in no acute distress. Pulm:  Breathing unlabored. Psy:  Normal mood, congruent affect. Skin: No visible rash Left hip: She has good range of motion with passive flexion and internal/external rotation.  She does not have a lot of pain with passive motion.  She is slightly tender over the greater trochanter, moderately tender in the sciatic notch, and maximally tender over the left SI joint.  Patrick's test increases her pain.  Lower extremity strength and reflexes are normal.    Imaging: XR HIP UNILAT W OR W/O PELVIS 2-3 VIEWS LEFT  Result Date: 10/27/2020 X-rays the left hip reveal well-preserved joint space, very early spurring in the acetabulum.  No sign of stress fracture or neoplasm.  SI joint looks unremarkable.   Assessment & Plan: 1.   Left posterior hip pain, suspect sacroiliac dysfunction. -We discussed treatment options and elected to try baclofen as needed, referred her to Dr. Imagene Sheller for chiropractic treatment.  If symptoms worsen, consider MRI scan.     Procedures: No procedures performed  No notes on file     PMFS History: Patient Active Problem List   Diagnosis Date Noted  . Chest pain 03/31/2017  . Elevated troponin   . Secondary malignant melanoma of brain ([place]) 10/14/2015  . Left homonymous hemianopsia 06/09/2015  . Chronic daily headache 06/09/2015  . Abnormal brain MRI 04/06/2015  . Awareness alteration, transient 04/06/2015  . Other headache syndrome   . Altered mental status 03/21/2015  . Acute encephalopathy 03/21/2015  . GERD (gastroesophageal reflux disease) 03/21/2015  . Anxiety 03/21/2015  . Dementia ([place]) 03/21/2015  . Polypharmacy 03/21/2015  . Vomiting 04/30/2014  . Nausea vomiting and diarrhea 04/30/2014  . Hyperglycemia 04/30/2014  . Metastatic melanoma to lung ([place]) 03/25/2014  . Protein-calorie malnutrition, severe ([place]) 03/19/2014  . Colitis 03/17/2014  . Candidiasis of mouth 12/16/2013  . Wound infection after surgery 12/05/2013  . Solitary brain metastasis from melanoma 12/03/2013  . S/P craniotomy 11/19/2013  . Lung nodule 11/16/2013  . Cerebellar mass 11/14/2013  . Hypothyroid 11/14/2013  . Tobacco  abuse 11/14/2013   Past Medical History:  Diagnosis Date  . Anxiety   . Brain cancer [facility])    awaiting pathology; assuming this is a lung primary with brain mets  . Dementia ([place])   . GERD (gastroesophageal reflux disease)   . Hypothyroidism   . Left homonymous hemianopsia    secondary to right posterior temporal and occipital lesions.  . Lung cancer [facility])    awaiting pathology  . Thyroid disease   . TMJ (dislocation of temporomandibular joint)     Family History  Problem Relation Age of Onset  . Dementia Mother   . Cancer - Other Other     Past Surgical  History:  Procedure Laterality Date  . APPENDECTOMY    . LUMBAR WOUND DEBRIDEMENT N/A 12/05/2013   Procedure: irrigation and debridement posterior cervical wound;  Surgeon: Eustace Moore, MD;  Location: [place] NEURO ORS;  Service: Neurosurgery;  Laterality: N/A;  . SUBOCCIPITAL CRANIECTOMY CERVICAL LAMINECTOMY N/A 11/19/2013   Procedure: SUBOCCIPITAL CRANIECTOMY for tumor;  Surgeon: Eustace Moore, MD;  Location: [place] NEURO ORS;  Service: Neurosurgery;  Laterality: N/A;  . TONSILLECTOMY    . VIDEO ASSISTED THORACOSCOPY (VATS)/WEDGE RESECTION Left 03/05/2014   Procedure: VIDEO ASSISTED THORACOSCOPY (VATS)/WEDGE RESECTION;  Surgeon: Melrose Nakayama, MD;  Location: [place];  Service: Thoracic;  Laterality: Left;   Social History   Occupational History  . Not on file  Tobacco Use  . Smoking status: Former Smoker    Quit date: 11/19/2013    Years since quitting: 6.9  . Smokeless tobacco: Never Used  Vaping Use  . Vaping Use: Never used  Substance and Sexual Activity  . Alcohol use: No    Alcohol/week: 0.0 standard drinks  . Drug use: No  . Sexual activity: Not on file

## 2020-11-09 DIAGNOSIS — M6289 Other specified disorders of muscle: Secondary | ICD-10-CM | POA: Diagnosis not present

## 2020-12-09 DIAGNOSIS — G43719 Chronic migraine without aura, intractable, without status migrainosus: Secondary | ICD-10-CM | POA: Diagnosis not present

## 2020-12-09 DIAGNOSIS — R519 Headache, unspecified: Secondary | ICD-10-CM | POA: Diagnosis not present

## 2020-12-29 DIAGNOSIS — N12 Tubulo-interstitial nephritis, not specified as acute or chronic: Secondary | ICD-10-CM

## 2020-12-29 HISTORY — DX: Tubulo-interstitial nephritis, not specified as acute or chronic: N12

## 2021-01-10 DIAGNOSIS — N12 Tubulo-interstitial nephritis, not specified as acute or chronic: Secondary | ICD-10-CM | POA: Diagnosis not present

## 2021-01-10 DIAGNOSIS — R310 Gross hematuria: Secondary | ICD-10-CM | POA: Diagnosis not present

## 2021-01-13 DIAGNOSIS — N12 Tubulo-interstitial nephritis, not specified as acute or chronic: Secondary | ICD-10-CM | POA: Diagnosis not present

## 2021-02-03 DIAGNOSIS — E876 Hypokalemia: Secondary | ICD-10-CM | POA: Diagnosis not present

## 2021-02-03 DIAGNOSIS — E559 Vitamin D deficiency, unspecified: Secondary | ICD-10-CM | POA: Diagnosis not present

## 2021-02-03 DIAGNOSIS — E038 Other specified hypothyroidism: Secondary | ICD-10-CM | POA: Diagnosis not present

## 2021-02-03 DIAGNOSIS — Z7952 Long term (current) use of systemic steroids: Secondary | ICD-10-CM | POA: Diagnosis not present

## 2021-02-03 DIAGNOSIS — E063 Autoimmune thyroiditis: Secondary | ICD-10-CM | POA: Diagnosis not present

## 2021-02-03 DIAGNOSIS — E2740 Unspecified adrenocortical insufficiency: Secondary | ICD-10-CM | POA: Diagnosis not present

## 2021-05-27 DIAGNOSIS — H524 Presbyopia: Secondary | ICD-10-CM | POA: Diagnosis not present

## 2021-05-27 DIAGNOSIS — H53462 Homonymous bilateral field defects, left side: Secondary | ICD-10-CM | POA: Diagnosis not present

## 2021-05-27 DIAGNOSIS — H25013 Cortical age-related cataract, bilateral: Secondary | ICD-10-CM | POA: Diagnosis not present

## 2021-05-27 DIAGNOSIS — H2513 Age-related nuclear cataract, bilateral: Secondary | ICD-10-CM | POA: Diagnosis not present

## 2021-05-27 DIAGNOSIS — R42 Dizziness and giddiness: Secondary | ICD-10-CM | POA: Diagnosis not present

## 2021-06-07 DIAGNOSIS — H5213 Myopia, bilateral: Secondary | ICD-10-CM | POA: Diagnosis not present

## 2021-06-07 DIAGNOSIS — Z0100 Encounter for examination of eyes and vision without abnormal findings: Secondary | ICD-10-CM | POA: Diagnosis not present

## 2021-06-07 DIAGNOSIS — H52209 Unspecified astigmatism, unspecified eye: Secondary | ICD-10-CM | POA: Diagnosis not present

## 2021-06-07 DIAGNOSIS — H524 Presbyopia: Secondary | ICD-10-CM | POA: Diagnosis not present

## 2021-06-16 DIAGNOSIS — G43719 Chronic migraine without aura, intractable, without status migrainosus: Secondary | ICD-10-CM | POA: Diagnosis not present

## 2021-06-16 DIAGNOSIS — R519 Headache, unspecified: Secondary | ICD-10-CM | POA: Diagnosis not present

## 2021-06-23 DIAGNOSIS — R42 Dizziness and giddiness: Secondary | ICD-10-CM | POA: Diagnosis not present

## 2021-06-23 DIAGNOSIS — Z87891 Personal history of nicotine dependence: Secondary | ICD-10-CM | POA: Diagnosis not present

## 2021-06-23 DIAGNOSIS — E2740 Unspecified adrenocortical insufficiency: Secondary | ICD-10-CM | POA: Diagnosis not present

## 2021-06-23 DIAGNOSIS — H903 Sensorineural hearing loss, bilateral: Secondary | ICD-10-CM | POA: Diagnosis not present

## 2021-06-23 DIAGNOSIS — Z85820 Personal history of malignant melanoma of skin: Secondary | ICD-10-CM | POA: Diagnosis not present

## 2021-06-23 DIAGNOSIS — J3489 Other specified disorders of nose and nasal sinuses: Secondary | ICD-10-CM | POA: Diagnosis not present

## 2021-06-23 DIAGNOSIS — M810 Age-related osteoporosis without current pathological fracture: Secondary | ICD-10-CM | POA: Diagnosis not present

## 2021-06-23 DIAGNOSIS — J34829 Nasal valve collapse, unspecified: Secondary | ICD-10-CM | POA: Insufficient documentation

## 2021-06-23 DIAGNOSIS — E063 Autoimmune thyroiditis: Secondary | ICD-10-CM | POA: Diagnosis not present

## 2021-06-23 DIAGNOSIS — E038 Other specified hypothyroidism: Secondary | ICD-10-CM | POA: Diagnosis not present

## 2021-06-23 HISTORY — DX: Nasal valve collapse, unspecified: J34.829

## 2021-08-30 DIAGNOSIS — R0981 Nasal congestion: Secondary | ICD-10-CM | POA: Diagnosis not present

## 2021-08-30 DIAGNOSIS — Z23 Encounter for immunization: Secondary | ICD-10-CM | POA: Diagnosis not present

## 2021-09-14 DIAGNOSIS — J342 Deviated nasal septum: Secondary | ICD-10-CM | POA: Diagnosis not present

## 2021-09-14 DIAGNOSIS — J3489 Other specified disorders of nose and nasal sinuses: Secondary | ICD-10-CM | POA: Diagnosis not present

## 2021-09-14 DIAGNOSIS — J310 Chronic rhinitis: Secondary | ICD-10-CM | POA: Diagnosis not present

## 2021-09-14 DIAGNOSIS — T485X5A Adverse effect of other anti-common-cold drugs, initial encounter: Secondary | ICD-10-CM | POA: Diagnosis not present

## 2021-10-11 DIAGNOSIS — C7931 Secondary malignant neoplasm of brain: Secondary | ICD-10-CM | POA: Diagnosis not present

## 2021-10-11 DIAGNOSIS — C439 Malignant melanoma of skin, unspecified: Secondary | ICD-10-CM | POA: Diagnosis not present

## 2021-10-11 DIAGNOSIS — R918 Other nonspecific abnormal finding of lung field: Secondary | ICD-10-CM | POA: Diagnosis not present

## 2021-10-11 DIAGNOSIS — C7802 Secondary malignant neoplasm of left lung: Secondary | ICD-10-CM | POA: Diagnosis not present

## 2021-10-11 DIAGNOSIS — Z9289 Personal history of other medical treatment: Secondary | ICD-10-CM | POA: Diagnosis not present

## 2021-10-11 DIAGNOSIS — Z79890 Hormone replacement therapy (postmenopausal): Secondary | ICD-10-CM | POA: Diagnosis not present

## 2021-10-11 DIAGNOSIS — Z87891 Personal history of nicotine dependence: Secondary | ICD-10-CM | POA: Diagnosis not present

## 2021-10-11 DIAGNOSIS — Z1589 Genetic susceptibility to other disease: Secondary | ICD-10-CM | POA: Diagnosis not present

## 2021-10-11 DIAGNOSIS — E039 Hypothyroidism, unspecified: Secondary | ICD-10-CM | POA: Diagnosis not present

## 2021-10-11 DIAGNOSIS — R519 Headache, unspecified: Secondary | ICD-10-CM | POA: Diagnosis not present

## 2021-10-11 DIAGNOSIS — E2740 Unspecified adrenocortical insufficiency: Secondary | ICD-10-CM | POA: Diagnosis not present

## 2021-12-02 DIAGNOSIS — R519 Headache, unspecified: Secondary | ICD-10-CM | POA: Diagnosis not present

## 2021-12-02 DIAGNOSIS — G43719 Chronic migraine without aura, intractable, without status migrainosus: Secondary | ICD-10-CM | POA: Diagnosis not present

## 2022-01-19 DIAGNOSIS — M810 Age-related osteoporosis without current pathological fracture: Secondary | ICD-10-CM | POA: Diagnosis not present

## 2022-01-19 DIAGNOSIS — E2740 Unspecified adrenocortical insufficiency: Secondary | ICD-10-CM | POA: Diagnosis not present

## 2022-01-19 DIAGNOSIS — E038 Other specified hypothyroidism: Secondary | ICD-10-CM | POA: Diagnosis not present

## 2022-01-19 DIAGNOSIS — E559 Vitamin D deficiency, unspecified: Secondary | ICD-10-CM | POA: Diagnosis not present

## 2022-01-19 DIAGNOSIS — E063 Autoimmune thyroiditis: Secondary | ICD-10-CM | POA: Diagnosis not present

## 2022-01-19 DIAGNOSIS — R69 Illness, unspecified: Secondary | ICD-10-CM | POA: Diagnosis not present

## 2022-02-23 ENCOUNTER — Ambulatory Visit (INDEPENDENT_AMBULATORY_CARE_PROVIDER_SITE_OTHER): Payer: Medicare HMO | Admitting: Psychology

## 2022-02-23 DIAGNOSIS — F4310 Post-traumatic stress disorder, unspecified: Secondary | ICD-10-CM

## 2022-02-23 DIAGNOSIS — F411 Generalized anxiety disorder: Secondary | ICD-10-CM

## 2022-02-23 DIAGNOSIS — R69 Illness, unspecified: Secondary | ICD-10-CM | POA: Diagnosis not present
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
## 2022-02-28 ENCOUNTER — Ambulatory Visit (INDEPENDENT_AMBULATORY_CARE_PROVIDER_SITE_OTHER): Payer: Medicare HMO | Admitting: Psychology

## 2022-02-28 DIAGNOSIS — F4310 Post-traumatic stress disorder, unspecified: Secondary | ICD-10-CM | POA: Diagnosis not present

## 2022-02-28 DIAGNOSIS — R69 Illness, unspecified: Secondary | ICD-10-CM | POA: Diagnosis not present
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
## 2022-03-16 ENCOUNTER — Ambulatory Visit (INDEPENDENT_AMBULATORY_CARE_PROVIDER_SITE_OTHER): Payer: Medicare HMO | Admitting: Psychology

## 2022-03-16 DIAGNOSIS — F4310 Post-traumatic stress disorder, unspecified: Secondary | ICD-10-CM | POA: Diagnosis not present

## 2022-03-16 DIAGNOSIS — R69 Illness, unspecified: Secondary | ICD-10-CM | POA: Diagnosis not present
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
## 2022-03-29 ENCOUNTER — Ambulatory Visit (INDEPENDENT_AMBULATORY_CARE_PROVIDER_SITE_OTHER): Payer: Medicare HMO | Admitting: Psychology

## 2022-03-29 DIAGNOSIS — F4310 Post-traumatic stress disorder, unspecified: Secondary | ICD-10-CM | POA: Diagnosis not present

## 2022-03-29 DIAGNOSIS — R69 Illness, unspecified: Secondary | ICD-10-CM | POA: Diagnosis not present
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
## 2022-04-06 ENCOUNTER — Ambulatory Visit: Payer: Medicare HMO | Admitting: Psychology

## 2022-04-13 ENCOUNTER — Ambulatory Visit: Payer: Medicare HMO | Admitting: Psychology

## 2022-04-13 DIAGNOSIS — R69 Illness, unspecified: Secondary | ICD-10-CM | POA: Diagnosis not present

## 2022-04-13 DIAGNOSIS — F4310 Post-traumatic stress disorder, unspecified: Secondary | ICD-10-CM
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
## 2022-04-20 ENCOUNTER — Ambulatory Visit: Payer: Medicare HMO | Admitting: Psychology

## 2022-04-20 DIAGNOSIS — F4310 Post-traumatic stress disorder, unspecified: Secondary | ICD-10-CM | POA: Diagnosis not present

## 2022-04-20 DIAGNOSIS — R69 Illness, unspecified: Secondary | ICD-10-CM | POA: Diagnosis not present
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
## 2022-05-08 DIAGNOSIS — G4700 Insomnia, unspecified: Secondary | ICD-10-CM | POA: Diagnosis not present

## 2022-05-08 DIAGNOSIS — M818 Other osteoporosis without current pathological fracture: Secondary | ICD-10-CM | POA: Diagnosis not present

## 2022-05-08 DIAGNOSIS — E039 Hypothyroidism, unspecified: Secondary | ICD-10-CM | POA: Diagnosis not present

## 2022-05-25 DIAGNOSIS — M818 Other osteoporosis without current pathological fracture: Secondary | ICD-10-CM | POA: Diagnosis not present

## 2022-05-25 DIAGNOSIS — G4700 Insomnia, unspecified: Secondary | ICD-10-CM | POA: Diagnosis not present

## 2022-05-25 DIAGNOSIS — Z1211 Encounter for screening for malignant neoplasm of colon: Secondary | ICD-10-CM | POA: Diagnosis not present

## 2022-05-25 DIAGNOSIS — Z0000 Encounter for general adult medical examination without abnormal findings: Secondary | ICD-10-CM | POA: Diagnosis not present

## 2022-05-25 DIAGNOSIS — Z1239 Encounter for other screening for malignant neoplasm of breast: Secondary | ICD-10-CM | POA: Diagnosis not present

## 2022-05-25 DIAGNOSIS — Z5320 Procedure and treatment not carried out because of patient's decision for unspecified reasons: Secondary | ICD-10-CM | POA: Diagnosis not present

## 2022-05-25 DIAGNOSIS — E2740 Unspecified adrenocortical insufficiency: Secondary | ICD-10-CM | POA: Diagnosis not present

## 2022-05-25 DIAGNOSIS — E039 Hypothyroidism, unspecified: Secondary | ICD-10-CM | POA: Diagnosis not present

## 2022-05-26 ENCOUNTER — Other Ambulatory Visit: Payer: Self-pay | Admitting: Family Medicine

## 2022-05-26 DIAGNOSIS — Z1231 Encounter for screening mammogram for malignant neoplasm of breast: Secondary | ICD-10-CM

## 2022-05-30 DIAGNOSIS — M810 Age-related osteoporosis without current pathological fracture: Secondary | ICD-10-CM | POA: Diagnosis not present

## 2022-05-30 LAB — HM DEXA SCAN

## 2022-06-06 DIAGNOSIS — G43719 Chronic migraine without aura, intractable, without status migrainosus: Secondary | ICD-10-CM | POA: Diagnosis not present

## 2022-06-10 ENCOUNTER — Ambulatory Visit
Admission: RE | Admit: 2022-06-10 | Discharge: 2022-06-10 | Disposition: A | Discharge status: Home / Self Care | Payer: Medicare HMO | Source: Ambulatory Visit | Attending: Family Medicine | Admitting: Family Medicine

## 2022-06-10 DIAGNOSIS — Z1231 Encounter for screening mammogram for malignant neoplasm of breast: Secondary | ICD-10-CM | POA: Diagnosis not present

## 2022-07-18 DIAGNOSIS — M810 Age-related osteoporosis without current pathological fracture: Secondary | ICD-10-CM | POA: Diagnosis not present

## 2022-07-18 DIAGNOSIS — E2740 Unspecified adrenocortical insufficiency: Secondary | ICD-10-CM | POA: Diagnosis not present

## 2022-07-18 DIAGNOSIS — E039 Hypothyroidism, unspecified: Secondary | ICD-10-CM | POA: Diagnosis not present

## 2022-08-12 DIAGNOSIS — M818 Other osteoporosis without current pathological fracture: Secondary | ICD-10-CM | POA: Diagnosis not present

## 2022-08-12 DIAGNOSIS — G4700 Insomnia, unspecified: Secondary | ICD-10-CM | POA: Diagnosis not present

## 2022-08-12 DIAGNOSIS — E039 Hypothyroidism, unspecified: Secondary | ICD-10-CM | POA: Diagnosis not present

## 2022-10-03 DIAGNOSIS — E2740 Unspecified adrenocortical insufficiency: Secondary | ICD-10-CM | POA: Diagnosis not present

## 2022-10-03 DIAGNOSIS — M818 Other osteoporosis without current pathological fracture: Secondary | ICD-10-CM | POA: Diagnosis not present

## 2022-10-03 DIAGNOSIS — R079 Chest pain, unspecified: Secondary | ICD-10-CM | POA: Diagnosis not present

## 2022-10-07 ENCOUNTER — Telehealth: Payer: Self-pay | Admitting: Interventional Cardiology

## 2022-10-07 NOTE — Telephone Encounter (Signed)
The patient is having chest pain for about 6 days, was with activity only but now it is at rest as well. Along with her left arm pain now as well. She is having pain of 3 on the pain scale. No SOB, nausea, vomiting, or sweating. She said it feels like rubber bands being twisted in her chest.  Advised the patient to go to the ER for a chest pain evaluation.    Verbalized understanding and agreement.

## 2022-10-07 NOTE — Progress Notes (Unsigned)
Cardiology Office Note:    Date:  10/10/2022   ID:  [name] [name], DOB 10-14-1961, MRN [id number]  PCP:  Orpah Melter, MD  Cardiologist:  None   Referring MD: Orpah Melter, MD   Chief Complaint  Patient presents with   Chest Pain    History of Present Illness:    [name] [name] is a 61 y.o. female with a hx of tobacco abuse, metastatic lung carcinoma, dementia, referred for evaluation of chest pain x 6 days with normal ECG by Dr Bjorn Loser..  7 to 8-day history of intermittent left arm, chest, and back discomfort.  Discomfort is occurring spontaneously.  Episodes last for relatively short period of time but have a twisting or tight feeling.  No associated shortness of breath.  Occasional palpitations associated.  Emergency room visit demonstrated no definite ischemic abnormality.  EKG did reveal some nonspecific ST change.  Previous 48 history of smoking.  No family history of coronary disease.  Past Medical History:  Diagnosis Date   Anxiety    Brain cancer [facility])    awaiting pathology; assuming this is a lung primary with brain mets   Dementia ([place])    GERD (gastroesophageal reflux disease)    Hypothyroidism    Left homonymous hemianopsia    secondary to right posterior temporal and occipital lesions.   Lung cancer [facility])    awaiting pathology   Thyroid disease    TMJ (dislocation of temporomandibular joint)     Past Surgical History:  Procedure Laterality Date   APPENDECTOMY     LUMBAR WOUND DEBRIDEMENT N/A 12/05/2013   Procedure: irrigation and debridement posterior cervical wound;  Surgeon: Eustace Moore, MD;  Location: [place] NEURO ORS;  Service: Neurosurgery;  Laterality: N/A;   SUBOCCIPITAL CRANIECTOMY CERVICAL LAMINECTOMY N/A 11/19/2013   Procedure: SUBOCCIPITAL CRANIECTOMY for tumor;  Surgeon: Eustace Moore, MD;  Location: [place] NEURO ORS;  Service: Neurosurgery;  Laterality: N/A;   TONSILLECTOMY     VIDEO ASSISTED THORACOSCOPY (VATS)/WEDGE RESECTION Left  03/05/2014   Procedure: VIDEO ASSISTED THORACOSCOPY (VATS)/WEDGE RESECTION;  Surgeon: Melrose Nakayama, MD;  Location: [facility] OR;  Service: Thoracic;  Laterality: Left;    Current Medications: Current Meds  Medication Sig   alendronate (FOSAMAX) 70 MG tablet Take 70 mg by mouth once a week.   bisacodyl (DULCOLAX) 5 MG EC tablet Take by mouth.   D 1000 25 MCG (1000 UT) capsule Take 2,000 Units by mouth daily.   gabapentin (NEURONTIN) 600 MG tablet Take 900 mg by mouth at bedtime.    hydrocortisone (CORTEF) 5 MG tablet Take 10 mg in am and 5 mg in pm.   hydrocortisone sodium succinate (SOLU-CORTEF) 100 MG SOLR injection Inject into the muscle.   levothyroxine (SYNTHROID, LEVOTHROID) 75 MCG tablet Take 75 mcg by mouth daily before breakfast.   metoprolol tartrate (LOPRESSOR) 50 MG tablet Take 1 tablet (50 mg total) by mouth once for 1 dose. Take 90-120 minutes prior to scan.   Multiple Vitamin (MULTI-VITAMIN) tablet Take 1 tablet by mouth daily.   potassium chloride SA (KLOR-CON M) 20 MEQ tablet Take 20 mEq by mouth daily. Pt takes 2 tablet 40 meg once a day.   UNABLE TO FIND Blood Work: Basic Metabolic Panel in 1 week. Dx: Hypokalemia. Results to PCP: Dr. Olen Pel.     Allergies:   Morphine and related and Scopolamine   Social History   Socioeconomic History   Marital status: Married    Spouse name: Not on file  Number of children: Not on file   Years of education: Not on file   Highest education level: Not on file  Occupational History   Not on file  Tobacco Use   Smoking status: Former    Types: Cigarettes    Quit date: 11/19/2013    Years since quitting: 8.8   Smokeless tobacco: Never  Vaping Use   Vaping Use: Never used  Substance and Sexual Activity   Alcohol use: No    Alcohol/week: 0.0 standard drinks of alcohol   Drug use: No   Sexual activity: Not on file  Other Topics Concern   Not on file  Social History Narrative   Right handed      Highest level of edu- 1  year of college      Lives with husband in one story home   Social Determinants of Health   Financial Resource Strain: Not on file  Food Insecurity: Not on file  Transportation Needs: Not on file  Physical Activity: Not on file  Stress: Not on file  Social Connections: Not on file     Family History: The patient's family history includes Cancer - Other in an other family member; Dementia in her mother.  ROS:   Please see the history of present illness.    Decreased short-term memory.  History of metastatic melanoma.  All other systems reviewed and are negative.  EKGs/Labs/Other Studies Reviewed:    The following studies were reviewed today: Chest x-ray did not reveal cardiac enlargement but did suggest emphysema.  EKG:  EKG performed on 09/1122 demonstrated nonspecific J-point and ST abnormality, diffuse.  Today's tracing of 10/10/2022 sinus rhythm, heart rate 68 bpm, normal in appearance.  Recent Labs: 10/08/2022: BUN 13; Creatinine, Ser 0.81; Hemoglobin 15.3; Platelets 278; Potassium 4.2; Sodium 137  Recent Lipid Panel No results found for: "CHOL", "TRIG", "HDL", "CHOLHDL", "VLDL", "LDLCALC", "LDLDIRECT"  Physical Exam:    VS:  BP 102/62   Pulse 68   Ht 5\' 7"  (1.702 m)   Wt 114 lb 3.2 oz (51.8 kg)   SpO2 98%   BMI 17.89 kg/m     Wt Readings from Last 3 Encounters:  10/10/22 114 lb 3.2 oz (51.8 kg)  10/08/22 115 lb (52.2 kg)  10/23/19 120 lb (54.4 kg)     GEN: Slender. No acute distress HEENT: Normal NECK: No JVD. LYMPHATICS: No lymphadenopathy CARDIAC: No murmur. RRR no gallop, or edema. VASCULAR:  Normal Pulses. No bruits. RESPIRATORY:  Clear to auscultation without rales, wheezing or rhonchi  ABDOMEN: Soft, non-tender, non-distended, No pulsatile mass, MUSCULOSKELETAL: No deformity  SKIN: Warm and dry NEUROLOGIC:  Alert and oriented x 3 PSYCHIATRIC:  Normal affect   ASSESSMENT:    1. Other chest pain    PLAN:    In order of problems listed  above:  Atypical features but in a patient with long smoking history and evidence of emphysema on chest x-ray.  Recommend a coronary CTA with FFR if indicated.  Significant calcification or plaque, will need risk factor modification.   Medication Adjustments/Labs and Tests Ordered: Current medicines are reviewed at length with the patient today.  Concerns regarding medicines are outlined above.  Orders Placed This Encounter  Procedures   CT CORONARY MORPH W/CTA COR W/SCORE W/CA W/CM &/OR WO/CM   EKG 12-Lead   Meds ordered this encounter  Medications   metoprolol tartrate (LOPRESSOR) 50 MG tablet    Sig: Take 1 tablet (50 mg total) by mouth once for 1  dose. Take 90-120 minutes prior to scan.    Dispense:  1 tablet    Refill:  0    Patient Instructions  Medication Instructions:  Your physician recommends that you continue on your current medications as directed. Please refer to the Current Medication list given to you today.  *If you need a refill on your cardiac medications before your next appointment, please call your pharmacy*  Lab Work: NONE  Testing/Procedures: Your physician has requested that you have a coronary CTA scan performed.  Follow-Up: As needed  Other Instructions   Your cardiac CT will be scheduled at:   [facility] [address] [place], [place] [address] [phone]  Please arrive at the [facility] Entrance (Entrance C2) of [facility] 30 minutes prior to test start time. You can use the FREE valet parking offered at entrance C (encouraged to control the heart rate for the test)  Proceed to the [facility] Radiology Department (first floor) to check-in and test prep.  All radiology patients and guests should use entrance C2 at [facility], accessed from [facility], even though the hospital's physical address listed is [address].    Please follow these instructions carefully  (unless otherwise directed):  On the Night Before the Test: Be sure to Drink plenty of water. Do not consume any caffeinated/decaffeinated beverages or chocolate 12 hours prior to your test. Do not take any antihistamines 12 hours prior to your test.  On the Day of the Test: Drink plenty of water until 1 hour prior to the test. Do not eat any food 1 hour prior to test. You may take your regular medications prior to the test.  Take metoprolol (Lopressor) 50mg  two hours prior to test. This was sent to [facility] in [place]. FEMALES- please wear underwire-free bra if available, avoid dresses & tight clothing      After the Test: Drink plenty of water. After receiving IV contrast, you may experience a mild flushed feeling. This is normal. On occasion, you may experience a mild rash up to 24 hours after the test. This is not dangerous. If this occurs, you can take Benadryl 25 mg and increase your fluid intake. If you experience trouble breathing, this can be serious. If it is severe call 911 IMMEDIATELY. If it is mild, please call our office. If you take any of these medications: Glipizide/Metformin, Avandament, Glucavance, please do not take 48 hours after completing test unless otherwise instructed.  We will call to schedule your test 2-4 weeks out understanding that some insurance companies will need an authorization prior to the service being performed.   For non-scheduling related questions, please contact the cardiac imaging nurse navigator should you have any questions/concerns: Marchia Bond, Cardiac Imaging Nurse Navigator Gordy Clement, Cardiac Imaging Nurse Navigator [facility] Direct Office Dial: [phone]   For scheduling needs, including cancellations and rescheduling, please call [place], [phone].   Important Information About Sugar         Signed, Sinclair Grooms, MD  10/10/2022 10:32 AM    [place]

## 2022-10-07 NOTE — Telephone Encounter (Signed)
Pt c/o of Chest Pain: STAT if CP now or developed within 24 hours  1. Are you having CP right now? No   2. Are you experiencing any other symptoms (ex. SOB, nausea, vomiting, sweating)?   3. How long have you been experiencing CP?   4. Is your CP continuous or coming and going?   5. Have you taken Nitroglycerin? No     Call routed to [facility], [place]  ?

## 2022-10-08 ENCOUNTER — Telehealth: Payer: Self-pay | Admitting: Physician Assistant

## 2022-10-08 ENCOUNTER — Emergency Department (HOSPITAL_BASED_OUTPATIENT_CLINIC_OR_DEPARTMENT_OTHER): Payer: Medicare HMO

## 2022-10-08 ENCOUNTER — Emergency Department (HOSPITAL_BASED_OUTPATIENT_CLINIC_OR_DEPARTMENT_OTHER)
Admission: EM | Admit: 2022-10-08 | Discharge: 2022-10-08 | Disposition: A | Discharge status: Home / Self Care | Payer: Medicare HMO | Attending: Emergency Medicine | Admitting: Emergency Medicine

## 2022-10-08 ENCOUNTER — Encounter (HOSPITAL_BASED_OUTPATIENT_CLINIC_OR_DEPARTMENT_OTHER): Payer: Self-pay | Admitting: Emergency Medicine

## 2022-10-08 DIAGNOSIS — R69 Illness, unspecified: Secondary | ICD-10-CM | POA: Diagnosis not present

## 2022-10-08 DIAGNOSIS — Z85841 Personal history of malignant neoplasm of brain: Secondary | ICD-10-CM | POA: Insufficient documentation

## 2022-10-08 DIAGNOSIS — E039 Hypothyroidism, unspecified: Secondary | ICD-10-CM | POA: Insufficient documentation

## 2022-10-08 DIAGNOSIS — R0789 Other chest pain: Secondary | ICD-10-CM | POA: Diagnosis not present

## 2022-10-08 DIAGNOSIS — Z79899 Other long term (current) drug therapy: Secondary | ICD-10-CM | POA: Insufficient documentation

## 2022-10-08 DIAGNOSIS — Z85118 Personal history of other malignant neoplasm of bronchus and lung: Secondary | ICD-10-CM | POA: Diagnosis not present

## 2022-10-08 DIAGNOSIS — F0390 Unspecified dementia without behavioral disturbance: Secondary | ICD-10-CM | POA: Diagnosis not present

## 2022-10-08 DIAGNOSIS — R079 Chest pain, unspecified: Secondary | ICD-10-CM | POA: Insufficient documentation

## 2022-10-08 LAB — CBC WITH DIFFERENTIAL/PLATELET
Abs Immature Granulocytes: 0.01 10*3/uL (ref 0.00–0.07)
Basophils Absolute: 0.1 10*3/uL (ref 0.0–0.1)
Basophils Relative: 1 %
Eosinophils Absolute: 0.1 10*3/uL (ref 0.0–0.5)
Eosinophils Relative: 2 %
HCT: 45.3 % (ref 36.0–46.0)
Hemoglobin: 15.3 g/dL — ABNORMAL HIGH (ref 12.0–15.0)
Immature Granulocytes: 0 %
Lymphocytes Relative: 27 %
Lymphs Abs: 1.8 10*3/uL (ref 0.7–4.0)
MCH: 31 pg (ref 26.0–34.0)
MCHC: 33.8 g/dL (ref 30.0–36.0)
MCV: 91.7 fL (ref 80.0–100.0)
Monocytes Absolute: 0.5 10*3/uL (ref 0.1–1.0)
Monocytes Relative: 7 %
Neutro Abs: 4.3 10*3/uL (ref 1.7–7.7)
Neutrophils Relative %: 63 %
Platelets: 278 10*3/uL (ref 150–400)
RBC: 4.94 MIL/uL (ref 3.87–5.11)
RDW: 12.7 % (ref 11.5–15.5)
WBC: 6.7 10*3/uL (ref 4.0–10.5)
nRBC: 0 % (ref 0.0–0.2)

## 2022-10-08 LAB — BASIC METABOLIC PANEL
Anion gap: 9 (ref 5–15)
BUN: 13 mg/dL (ref 8–23)
CO2: 21 mmol/L — ABNORMAL LOW (ref 22–32)
Calcium: 9.4 mg/dL (ref 8.9–10.3)
Chloride: 107 mmol/L (ref 98–111)
Creatinine, Ser: 0.81 mg/dL (ref 0.44–1.00)
GFR, Estimated: >60 mL/min (ref >60)
Glucose, Bld: 170 mg/dL — ABNORMAL HIGH (ref 70–99)
Potassium: 4.2 mmol/L (ref 3.5–5.1)
Sodium: 137 mmol/L (ref 135–145)

## 2022-10-08 LAB — TROPONIN I (HIGH SENSITIVITY)
Troponin I (High Sensitivity): 2 ng/L (ref <18)
Troponin I (High Sensitivity): 3 ng/L (ref <18)

## 2022-10-08 LAB — D-DIMER, QUANTITATIVE: D-Dimer, Quant: 0.31 ug/mL-FEU (ref 0.00–0.50)

## 2022-10-08 MED ORDER — FAMOTIDINE 20 MG PO TABS
20.0000 mg | ORAL_TABLET | Freq: Once | ORAL | Status: AC
  Administered 2022-10-08: 20 mg via ORAL
  Filled 2022-10-08: qty 1

## 2022-10-08 MED ORDER — KETOROLAC TROMETHAMINE 15 MG/ML IJ SOLN
15.0000 mg | Freq: Once | INTRAMUSCULAR | Status: AC
  Administered 2022-10-08: 15 mg via INTRAMUSCULAR
  Filled 2022-10-08: qty 1

## 2022-10-08 MED ORDER — ALUM & MAG HYDROXIDE-SIMETH 200-200-20 MG/5ML PO SUSP
30.0000 mL | Freq: Once | ORAL | Status: AC
  Administered 2022-10-08: 30 mL via ORAL
  Filled 2022-10-08: qty 30

## 2022-10-08 MED ORDER — LIDOCAINE VISCOUS HCL 2 % MT SOLN
15.0000 mL | Freq: Once | OROMUCOSAL | Status: AC
  Administered 2022-10-08: 15 mL via ORAL
  Filled 2022-10-08: qty 15

## 2022-10-08 NOTE — Telephone Encounter (Signed)
   The patient called the answering service after-hours today. Per notes, the patient was advised to go to ER yesterday for chest pain. She said she saw PCP earlier this week and was told her EKG was abnormal. She was pending OP appointment on Monday with Dr. Tamala Julian. (She was previously seen in consult remotely by Dr. Angelena Form.) In the interim she has continued to have several days worth of chest pain and is on the border of calling 911. I told her that we cannot fully/safely evaluate chest pain over the phone. She went into detail about how she is concerned that the hospitalists may not work her up the way that [place] would. I told her it is difficult to know whether her chest pain will turn out to be cardiac but to seek care in the ER as instructed so they can begin the workup. I encouraged her not to delay this any longer. Will keep OV on Monday for now pending ER outcome. Will cc Dr. Tamala Julian so he is aware. He also cares for patient's husband Tyrone Nine. The patient verbalized understanding and gratitude.  Charlie Pitter, PA-C

## 2022-10-08 NOTE — ED Triage Notes (Signed)
Pt arrives pov, steady gait, c/o LT side CP radiating to back x 7 days. Pt reports Im having heart pain"

## 2022-10-08 NOTE — Discharge Instructions (Addendum)
Note your work-up today was overall reassuring.  Recommend keeping your appointment with cardiology on Monday.  You can take Tylenol/Motrin as needed for pain.  As discussed, please do not hesitate to return to the emergency department for worrisome signs and symptoms we discussed become apparent.

## 2022-10-08 NOTE — ED Provider Notes (Signed)
[facility] EMERGENCY DEPARTMENT Provider Note   CSN: [id number] Arrival date & time: 10/08/22  1438     History  Chief Complaint  Patient presents with   Chest Pain    [name] [name] is a 61 y.o. female.   Chest Pain   61 year old female presents emergency department complaints of left-sided chest pain.  She states symptoms began 2 days ago when she was walking her dog.  She notes intermittent exacerbation of symptoms sometimes related to physical activity and sometimes experienced at rest.  Episodes last a matter of seconds in duration.  Pain is described as squeezing sensation.  She notes some radiation to her left lateral ribs beneath the axilla.  She has appointment set up with cardiology on Monday was told to come the emergency department for further evaluation.  Denies cardiac history.  She has extensive history of metastatic melanoma to the brain and lung.  Denies history of DVT/PE.  Denies any associate shortness of breath, fever, chills, night sweats abdominal pain, nausea, vomiting, lower extremity edema..  Past medical history significant for GERD, lung malignancy, dementia, anxiety, hypothyroidism, TMJ, malignant melanoma of brain.  Home Medications Prior to Admission medications   Medication Sig Start Date End Date Taking? Authorizing Provider  baclofen (LIORESAL) 10 MG tablet Take 0.5-1 tablets (5-10 mg total) by mouth 3 (three) times daily as needed for muscle spasms. 10/27/20   Hilts, Casimiro Needle, MD  bisacodyl (DULCOLAX) 5 MG EC tablet Take by mouth.    [provider]  gabapentin (NEURONTIN) 600 MG tablet Take 900 mg by mouth at bedtime.     [provider]  hydrocortisone (CORTEF) 5 MG tablet Take 10 mg in am and 5 mg in pm. 09/09/15   [provider]  hydrocortisone sodium succinate (SOLU-CORTEF) 100 MG SOLR injection Inject into the muscle. 06/12/18   [provider]  levothyroxine (SYNTHROID, LEVOTHROID) 75 MCG tablet  Take 75 mcg by mouth daily before breakfast.    [provider]  mirtazapine (REMERON) 30 MG tablet Take 30 mg by mouth at bedtime. 01/26/17   [provider]  potassium chloride SA (K-DUR,KLOR-CON) 20 MEQ tablet Take 1 tablet (20 mEq total) by mouth daily. 04/03/17   Osvaldo Shipper, MD  promethazine (PHENERGAN) 25 MG suppository Place 1 suppository (25 mg total) rectally every 6 (six) hours as needed for nausea or vomiting. 07/10/15   Mabe, Latanya Maudlin, MD  UNABLE TO FIND Blood Work: Basic Metabolic Panel in 1 week. Dx: Hypokalemia. Results to PCP: Dr. Lenise Arena. 04/03/17   Osvaldo Shipper, MD      Allergies    Morphine and related and Scopolamine    Review of Systems   Review of Systems  Cardiovascular:  Positive for chest pain.  All other systems reviewed and are negative.   Physical Exam Updated Vital Signs BP 108/82   Pulse 76   Temp 98.5 F (36.9 C) (Oral)   Resp 17   Ht 5\' 7"  (1.702 m)   Wt 52.2 kg   SpO2 99%   BMI 18.01 kg/m  Physical Exam Vitals and nursing note reviewed.  Constitutional:      General: She is not in acute distress.    Appearance: She is well-developed.  HENT:     Head: Normocephalic and atraumatic.  Eyes:     Conjunctiva/sclera: Conjunctivae normal.  Neck:     Vascular: No JVD.  Cardiovascular:     Rate and Rhythm: Normal rate and regular rhythm.  Pulses: Normal pulses.  Pulmonary:     Effort: Pulmonary effort is normal. No respiratory distress.     Breath sounds: Normal breath sounds. No wheezing, rhonchi or rales.  Abdominal:     Palpations: Abdomen is soft.     Tenderness: There is no abdominal tenderness.  Musculoskeletal:        General: No swelling.     Cervical back: Normal range of motion and neck supple.     Right lower leg: No edema.     Left lower leg: No edema.  Skin:    General: Skin is warm and dry.     Capillary Refill: Capillary refill takes less than 2 seconds.  Neurological:     Mental Status: She is alert.   Psychiatric:        Mood and Affect: Mood normal.     ED Results / Procedures / Treatments   Labs (all labs ordered are listed, but only abnormal results are displayed) Labs Reviewed  BASIC METABOLIC PANEL - Abnormal; Notable for the following components:      Result Value   CO2 21 (*)    Glucose, Bld 170 (*)    All other components within normal limits  CBC WITH DIFFERENTIAL/PLATELET - Abnormal; Notable for the following components:   Hemoglobin 15.3 (*)    All other components within normal limits  D-DIMER, QUANTITATIVE  TROPONIN I (HIGH SENSITIVITY)  TROPONIN I (HIGH SENSITIVITY)    EKG EKG Interpretation  Date/Time:  Saturday October 08 2022 14:50:56 EST Ventricular Rate:  84 PR Interval:  134 QRS Duration: 95 QT Interval:  367 QTC Calculation: 434 R Axis:   79 Text Interpretation: Sinus rhythm Confirmed by Virgina Norfolk ([id number]) on 10/08/2022 2:54:43 PM  Radiology DG Chest Portable 1 View  Result Date: 10/08/2022 CLINICAL DATA:  Chest pain EXAM: PORTABLE CHEST 1 VIEW COMPARISON:  03/31/2017 FINDINGS: The heart size and mediastinal contours are within normal limits. Probable emphysema. Surgical suture in the left midlung. The visualized skeletal structures are unremarkable. IMPRESSION: Probable emphysema without acute abnormality of the lungs. Electronically Signed   By: Jearld Lesch M.D.   On: 10/08/2022 15:01    Procedures Procedures    Medications Ordered in ED Medications  ketorolac (TORADOL) 15 MG/ML injection 15 mg (has no administration in time range)  famotidine (PEPCID) tablet 20 mg (20 mg Oral Given 10/08/22 1544)  alum & mag hydroxide-simeth (MAALOX/MYLANTA) 200-200-20 MG/5ML suspension 30 mL (30 mLs Oral Given 10/08/22 1544)    And  lidocaine (XYLOCAINE) 2 % viscous mouth solution 15 mL (15 mLs Oral Given 10/08/22 1544)    ED Course/ Medical Decision Making/ A&P                           Medical Decision Making Amount and/or Complexity of  Data Reviewed Labs: ordered. Radiology: ordered.  Risk OTC drugs. Prescription drug management.   This patient presents to the ED for concern of chest pain, this involves an extensive number of treatment options, and is a complaint that carries with it a high risk of complications and morbidity.  The differential diagnosis includes ACS, PE, pneumonia, aortic dissection, thoracic aortic aneurysm, COPD/asthma exacerbation, malignancy, CHF exacerbation, MSK, pleurisy, pneumothorax   Co morbidities that complicate the patient evaluation  See HPI   Additional history obtained:  Additional history obtained from EMR External records from outside source obtained and reviewed including prior EKGs from hospital records   Lab Tests:  I Ordered, and personally interpreted labs.  The pertinent results include: No leukocytosis noted.  No evidence anemia.  Platelets within normal range.  No electrolyte abnormalities noted.  Renal function within normal limits.  Initial troponin of 2 with repeat 3; EKG with sinus rhythm with nonspecific ST changes.   Imaging Studies ordered:  I ordered imaging studies including chest x-ray I independently visualized and interpreted imaging which showed probable emphysema without acute abnormality in the lung. I agree with the radiologist interpretation  Cardiac Monitoring: / EKG:  The patient was maintained on a cardiac monitor.  I personally viewed and interpreted the cardiac monitored which showed an underlying rhythm of: Sinus rhythm with nonspecific ST changes.   Consultations Obtained:  N/a   Problem List / ED Course / Critical interventions / Medication management  Chest pain I ordered medication including Pepcid, Maalox for GI cocktail.  Toradol for pain. Reevaluation of the patient after these medicines showed that the patient improved I have reviewed the patients home medicines and have made adjustments as needed   Social Determinants of  Health:  Denies tobacco, illicit drug use   Test / Admission - Considered:  Chest pain Vitals signs within normal range and stable throughout visit. Laboratory/imaging studies significant for: See above Doubt PE given lack of pleuritic type chest pain with negative D-dimer.  Doubt ACS given delta negative troponin EKG concerning for sinus rhythm with nonspecific ST changes.  Doubt dissection.  Doubt pneumothorax.  Doubt CHF exacerbation.  Patient reassured with overall negative work-up as well as symptomatic response to medicines administered in the emergency department.  Patient has close follow-up with cardiology on Monday.  Patient recommended to keep appointment for further assessment/evaluation.  Treatment plan discussed at length with patient she knowledge understanding was agreeable to said plan.  Strict return precautions were discussed at length. Worrisome signs and symptoms were discussed with the patient, and the patient acknowledged understanding to return to the ED if noticed. Patient was stable upon discharge.          Final Clinical Impression(s) / ED Diagnoses Final diagnoses:  Chest pain, unspecified type    Rx / DC Orders ED Discharge Orders     None         Peter Garter, [place] 10/08/22 1821    Alvira Monday, MD 10/10/22 1256

## 2022-10-10 ENCOUNTER — Encounter: Payer: Self-pay | Admitting: Interventional Cardiology

## 2022-10-10 ENCOUNTER — Ambulatory Visit: Payer: Medicare HMO | Attending: Interventional Cardiology | Admitting: Interventional Cardiology

## 2022-10-10 VITALS — BP 102/62 | HR 68 | Ht 67.0 in | Wt 114.2 lb

## 2022-10-10 DIAGNOSIS — R0789 Other chest pain: Secondary | ICD-10-CM

## 2022-10-10 MED ORDER — METOPROLOL TARTRATE 50 MG PO TABS: 50.0000 mg | ORAL_TABLET | Freq: Once | ORAL | 0 refills | Status: DC

## 2022-10-10 NOTE — Patient Instructions (Addendum)
Medication Instructions:  Your physician recommends that you continue on your current medications as directed. Please refer to the Current Medication list given to you today.  *If you need a refill on your cardiac medications before your next appointment, please call your pharmacy*  Lab Work: NONE  Testing/Procedures: Your physician has requested that you have a coronary CTA scan performed.  Follow-Up: As needed  Other Instructions   Your cardiac CT will be scheduled at:   [facility] [address] [place], [place] [address] [phone]  Please arrive at the [facility] (Entrance C2) of [facility] 30 minutes prior to test start time. You can use the FREE valet parking offered at entrance C (encouraged to control the heart rate for the test)  Proceed to the [facility] Radiology Department (first floor) to check-in and test prep.  All radiology patients and guests should use entrance C2 at [facility], accessed from [facility], even though the hospital's physical address listed is [address].    Please follow these instructions carefully (unless otherwise directed):  On the Night Before the Test: Be sure to Drink plenty of water. Do not consume any caffeinated/decaffeinated beverages or chocolate 12 hours prior to your test. Do not take any antihistamines 12 hours prior to your test.  On the Day of the Test: Drink plenty of water until 1 hour prior to the test. Do not eat any food 1 hour prior to test. You may take your regular medications prior to the test.  Take metoprolol (Lopressor) 50mg  two hours prior to test. This was sent to [facility] in [place]. FEMALES- please wear underwire-free bra if available, avoid dresses & tight clothing      After the Test: Drink plenty of water. After receiving IV contrast, you may experience a mild flushed feeling. This is normal. On occasion, you may  experience a mild rash up to 24 hours after the test. This is not dangerous. If this occurs, you can take Benadryl 25 mg and increase your fluid intake. If you experience trouble breathing, this can be serious. If it is severe call 911 IMMEDIATELY. If it is mild, please call our office. If you take any of these medications: Glipizide/Metformin, Avandament, Glucavance, please do not take 48 hours after completing test unless otherwise instructed.  We will call to schedule your test 2-4 weeks out understanding that some insurance companies will need an authorization prior to the service being performed.   For non-scheduling related questions, please contact the cardiac imaging nurse navigator should you have any questions/concerns: Marchia Bond, Cardiac Imaging Nurse Navigator Gordy Clement, Cardiac Imaging Nurse Navigator [facility] Direct Office Dial: [phone]   For scheduling needs, including cancellations and rescheduling, please call [place], [phone].   Important Information About Sugar

## 2022-10-11 DIAGNOSIS — E039 Hypothyroidism, unspecified: Secondary | ICD-10-CM | POA: Diagnosis not present

## 2022-10-11 DIAGNOSIS — C7802 Secondary malignant neoplasm of left lung: Secondary | ICD-10-CM | POA: Diagnosis not present

## 2022-10-11 DIAGNOSIS — Z79890 Hormone replacement therapy (postmenopausal): Secondary | ICD-10-CM | POA: Diagnosis not present

## 2022-10-11 DIAGNOSIS — Z87891 Personal history of nicotine dependence: Secondary | ICD-10-CM | POA: Diagnosis not present

## 2022-10-11 DIAGNOSIS — R930 Abnormal findings on diagnostic imaging of skull and head, not elsewhere classified: Secondary | ICD-10-CM | POA: Diagnosis not present

## 2022-10-11 DIAGNOSIS — C439 Malignant melanoma of skin, unspecified: Secondary | ICD-10-CM | POA: Diagnosis not present

## 2022-10-11 DIAGNOSIS — C7931 Secondary malignant neoplasm of brain: Secondary | ICD-10-CM | POA: Diagnosis not present

## 2022-10-11 DIAGNOSIS — Z9221 Personal history of antineoplastic chemotherapy: Secondary | ICD-10-CM | POA: Diagnosis not present

## 2022-10-11 DIAGNOSIS — E2740 Unspecified adrenocortical insufficiency: Secondary | ICD-10-CM | POA: Diagnosis not present

## 2022-10-13 ENCOUNTER — Telehealth: Payer: Self-pay | Admitting: Radiation Oncology

## 2022-10-13 NOTE — Telephone Encounter (Signed)
Received an urgent Medical Record Release Request from [facility]. Request forwarded to [employer] 11/16.

## 2022-10-14 DIAGNOSIS — C7931 Secondary malignant neoplasm of brain: Secondary | ICD-10-CM | POA: Diagnosis not present

## 2022-10-19 ENCOUNTER — Telehealth (HOSPITAL_COMMUNITY): Payer: Self-pay | Admitting: *Deleted

## 2022-10-19 NOTE — Telephone Encounter (Signed)
Reaching out to patient to offer assistance regarding upcoming cardiac imaging study; pt verbalizes understanding of appt date/time, parking situation and where to check in, medications ordered, and verified current allergies; name and call back number provided for further questions should they arise  Gordy Clement RN Navigator Cardiac Imaging [facility] [phone] office [phone] cell  Patient to take 25mg  metoprolol tartrate if HR greater than 65bpm and 50mg  if HR greater than 70bpm two hours prior to her cardiac CT scan.  She is aware to arrive at 12:30pm.

## 2022-10-21 ENCOUNTER — Ambulatory Visit (HOSPITAL_COMMUNITY)
Admission: RE | Admit: 2022-10-21 | Discharge: 2022-10-21 | Disposition: A | Discharge status: Home / Self Care | Payer: Medicare HMO | Source: Ambulatory Visit | Attending: Interventional Cardiology | Admitting: Interventional Cardiology

## 2022-10-21 DIAGNOSIS — I2510 Atherosclerotic heart disease of native coronary artery without angina pectoris: Secondary | ICD-10-CM | POA: Insufficient documentation

## 2022-10-21 DIAGNOSIS — R0789 Other chest pain: Secondary | ICD-10-CM | POA: Diagnosis not present

## 2022-10-21 MED ORDER — NITROGLYCERIN 0.4 MG SL SUBL
0.8000 mg | SUBLINGUAL_TABLET | Freq: Once | SUBLINGUAL | Status: AC
  Administered 2022-10-21: 0.8 mg via SUBLINGUAL

## 2022-10-21 MED ORDER — NITROGLYCERIN 0.4 MG SL SUBL
SUBLINGUAL_TABLET | SUBLINGUAL | Status: AC
  Filled 2022-10-21: qty 2

## 2022-10-21 MED ORDER — IOHEXOL 350 MG/ML SOLN
100.0000 mL | Freq: Once | INTRAVENOUS | Status: AC | PRN
  Administered 2022-10-21: 100 mL via INTRAVENOUS

## 2022-10-26 ENCOUNTER — Telehealth: Payer: Self-pay | Admitting: Interventional Cardiology

## 2022-10-26 DIAGNOSIS — R931 Abnormal findings on diagnostic imaging of heart and coronary circulation: Secondary | ICD-10-CM

## 2022-10-26 MED ORDER — ASPIRIN 81 MG PO TBEC: 81.0000 mg | DELAYED_RELEASE_TABLET | Freq: Every day | ORAL | Status: AC

## 2022-10-26 NOTE — Telephone Encounter (Signed)
-----   Message from Belva Crome, MD sent at 10/25/2022  5:17 PM EST ----- Let the patient know the CT scan does not reveal any evidence of significant blockage.  The coronary calcium score is mildly elevated at 98.  Needs to be on statin therapy for prevention.  Do not feel that chest pain is due to blocked arteries on the surface of the heart.

## 2022-10-26 NOTE — Telephone Encounter (Signed)
Spoke with patient and discussed results of CT scan.  Per Dr. Tamala Julian: Let the patient know the CT scan does not reveal any evidence of significant blockage. The coronary calcium score is mildly elevated at 98. Needs to be on statin therapy for prevention. Do not feel that chest pain is due to blocked arteries on the surface of the heart.   Needs lipid panel. Needs statin based on results.     Lab appointment scheduled for 10/31/22 for Lipid panel.  ASA 81mg  QD added to medication list.  Patient verbalized understanding of the above and expressed appreciation for call.

## 2022-10-28 DIAGNOSIS — B001 Herpesviral vesicular dermatitis: Secondary | ICD-10-CM | POA: Diagnosis not present

## 2022-10-28 DIAGNOSIS — H9201 Otalgia, right ear: Secondary | ICD-10-CM | POA: Diagnosis not present

## 2022-10-31 ENCOUNTER — Other Ambulatory Visit: Payer: Medicare HMO

## 2022-11-01 ENCOUNTER — Other Ambulatory Visit: Payer: Medicare HMO

## 2022-11-02 ENCOUNTER — Ambulatory Visit: Payer: Medicare HMO | Attending: Interventional Cardiology

## 2022-11-02 DIAGNOSIS — R931 Abnormal findings on diagnostic imaging of heart and coronary circulation: Secondary | ICD-10-CM

## 2022-11-02 LAB — LIPID PANEL
Chol/HDL Ratio: 3 ratio (ref 0.0–4.4)
Cholesterol, Total: 163 mg/dL (ref 100–199)
HDL: 54 mg/dL (ref >39)
LDL Chol Calc (NIH): 92 mg/dL (ref 0–99)
Triglycerides: 93 mg/dL (ref 0–149)
VLDL Cholesterol Cal: 17 mg/dL (ref 5–40)

## 2022-11-09 ENCOUNTER — Telehealth: Payer: Self-pay | Admitting: Interventional Cardiology

## 2022-11-09 DIAGNOSIS — E785 Hyperlipidemia, unspecified: Secondary | ICD-10-CM

## 2022-11-09 DIAGNOSIS — Z79899 Other long term (current) drug therapy: Secondary | ICD-10-CM

## 2022-11-09 MED ORDER — ROSUVASTATIN CALCIUM 5 MG PO TABS: 5.0000 mg | ORAL_TABLET | Freq: Every day | ORAL | 3 refills | Status: DC

## 2022-11-09 NOTE — Telephone Encounter (Signed)
Spoke with patient and discussed lab results.  Per Dr. Tamala Julian: Let the patient know there is mild elevation in LDL cholesterol. With positive calcium score, LDL should be less than 70. I recommend very low-dose rosuvastatin 5 mg daily. Liver and lipid panel in 6 to 8 weeks after initiating therapy.   [facility]uvastatin 5mg  [facility]D [facility] per patient request.  Liver and lipid panel ordered, lab appt scheduled for 12/22/2022.   Patient verbalized understanding of the above and expressed appreciation for call.

## 2022-11-09 NOTE — Telephone Encounter (Signed)
-----   Message from Belva Crome, MD sent at 11/09/2022  8:17 AM EST ----- Let the patient know there is mild elevation in LDL cholesterol.  With positive calcium score, LDL should be less than 70.  I recommend very low-dose rosuvastatin 5 mg daily.  Liver and lipid panel in 6 to 8 weeks after initiating therapy. A copy will be sent to Stacie Glaze, DO

## 2022-12-05 DIAGNOSIS — G43719 Chronic migraine without aura, intractable, without status migrainosus: Secondary | ICD-10-CM | POA: Diagnosis not present

## 2022-12-06 DIAGNOSIS — Z1211 Encounter for screening for malignant neoplasm of colon: Secondary | ICD-10-CM | POA: Diagnosis not present

## 2022-12-06 DIAGNOSIS — H6121 Impacted cerumen, right ear: Secondary | ICD-10-CM | POA: Diagnosis not present

## 2022-12-06 DIAGNOSIS — H9201 Otalgia, right ear: Secondary | ICD-10-CM | POA: Diagnosis not present

## 2022-12-13 DIAGNOSIS — Z1211 Encounter for screening for malignant neoplasm of colon: Secondary | ICD-10-CM | POA: Diagnosis not present

## 2022-12-22 ENCOUNTER — Ambulatory Visit: Payer: Medicare HMO | Attending: Family Medicine

## 2022-12-22 DIAGNOSIS — Z79899 Other long term (current) drug therapy: Secondary | ICD-10-CM

## 2022-12-22 DIAGNOSIS — E785 Hyperlipidemia, unspecified: Secondary | ICD-10-CM | POA: Diagnosis not present

## 2022-12-22 LAB — LIPID PANEL
Chol/HDL Ratio: 2.4 ratio (ref 0.0–4.4)
Cholesterol, Total: 139 mg/dL (ref 100–199)
HDL: 59 mg/dL (ref >39)
LDL Chol Calc (NIH): 56 mg/dL (ref 0–99)
Triglycerides: 140 mg/dL (ref 0–149)
VLDL Cholesterol Cal: 24 mg/dL (ref 5–40)

## 2022-12-22 LAB — HEPATIC FUNCTION PANEL
ALT: 16 IU/L (ref 0–32)
AST: 22 IU/L (ref 0–40)
Albumin: 4.3 g/dL (ref 3.9–4.9)
Alkaline Phosphatase: 120 IU/L (ref 44–121)
Bilirubin Total: 0.2 mg/dL (ref 0.0–1.2)
Bilirubin, Direct: <0.1 mg/dL (ref 0.00–0.40)
Total Protein: 6.8 g/dL (ref 6.0–8.5)

## 2022-12-29 DIAGNOSIS — H10403 Unspecified chronic conjunctivitis, bilateral: Secondary | ICD-10-CM | POA: Diagnosis not present

## 2022-12-29 DIAGNOSIS — H0012 Chalazion right lower eyelid: Secondary | ICD-10-CM | POA: Diagnosis not present

## 2023-01-25 DIAGNOSIS — Z79899 Other long term (current) drug therapy: Secondary | ICD-10-CM | POA: Diagnosis not present

## 2023-01-25 DIAGNOSIS — C7931 Secondary malignant neoplasm of brain: Secondary | ICD-10-CM | POA: Diagnosis not present

## 2023-02-01 ENCOUNTER — Encounter: Payer: Self-pay | Admitting: Cardiology

## 2023-02-01 ENCOUNTER — Ambulatory Visit: Payer: Medicare HMO | Attending: Cardiology | Admitting: Cardiology

## 2023-02-01 VITALS — BP 83/54 | HR 85 | Ht 67.0 in | Wt 115.0 lb

## 2023-02-01 DIAGNOSIS — G9389 Other specified disorders of brain: Secondary | ICD-10-CM

## 2023-02-01 DIAGNOSIS — R931 Abnormal findings on diagnostic imaging of heart and coronary circulation: Secondary | ICD-10-CM | POA: Diagnosis not present

## 2023-02-01 DIAGNOSIS — R0789 Other chest pain: Secondary | ICD-10-CM | POA: Diagnosis not present

## 2023-02-01 DIAGNOSIS — E785 Hyperlipidemia, unspecified: Secondary | ICD-10-CM

## 2023-02-01 NOTE — Patient Instructions (Signed)
Medication Instructions:   Your physician recommends that you continue on your current medications as directed. Please refer to the Current Medication list given to you today.   *If you need a refill on your cardiac medications before your next appointment, please call your pharmacy*   Lab Work:  None ordered.  If you have labs (blood work) drawn today and your tests are completely normal, you will receive your results only by: [place] (if you have MyChart) OR A paper copy in the mail If you have any lab test that is abnormal or we need to change your treatment, we will call you to review the results.   Testing/Procedures:  None ordered.   Follow-Up: At [facility], you and your health needs are our priority.  As part of our continuing mission to provide you with exceptional heart care, we have created designated Provider Care Teams.  These Care Teams include your primary Cardiologist (physician) and Advanced Practice Providers (APPs -  Physician Assistants and Nurse Practitioners) who all work together to provide you with the care you need, when you need it.  We recommend signing up for the patient portal called "MyChart".  Sign up information is provided on this After Visit Summary.  MyChart is used to connect with patients for Virtual Visits (Telemedicine).  Patients are able to view lab/test results, encounter notes, upcoming appointments, etc.  Non-urgent messages can be sent to your provider as well.   To learn more about what you can do with MyChart, go to [email or web address].    Your next appointment:   1 year(s)  Provider:   Candee Furbish, MD    Other Instructions  Your physician wants you to follow-up in: 1 year with Dr.Skains.  You will receive a reminder letter in the mail two months in advance. If you don't receive a letter, please call our office to schedule the follow-up appointment.

## 2023-02-01 NOTE — Progress Notes (Signed)
Cardiology Office Note:    Date:  02/01/2023   ID:  [name], DOB 08-31-1961, MRN [id number]  PCP:  Orpah Melter, MD   [place] Providers Cardiologist:  None     Referring MD: Orpah Melter, MD    History of Present Illness:    [name] [name] is a 62 y.o. female former patient of Dr. Daneen Schick is here for clinic follow-up with history of tobacco use metastatic lung carcinoma, prior chest pain evaluation.  At last visit was having a few days of intermittent left arm chest and back discomfort.  Spontaneous.  Palpitations noted.  Emergency room visit reviewed with nonspecific ST-T wave changes no definitive ischemic changes.  No family history of coronary artery disease.  Overall been doing fairly well.  She showed me her craniotomy site.  She has hyperacusis.  No fevers chills nausea vomiting syncope bleeding.  Has adrenal insufficiency.  Past Medical History:  Diagnosis Date   Anxiety    Brain cancer [facility])    awaiting pathology; assuming this is a lung primary with brain mets   Dementia ([place])    GERD (gastroesophageal reflux disease)    Hypothyroidism    Left homonymous hemianopsia    secondary to right posterior temporal and occipital lesions.   Lung cancer [facility])    awaiting pathology   Thyroid disease    TMJ (dislocation of temporomandibular joint)     Past Surgical History:  Procedure Laterality Date   APPENDECTOMY     LUMBAR WOUND DEBRIDEMENT N/A 12/05/2013   Procedure: irrigation and debridement posterior cervical wound;  Surgeon: Eustace Moore, MD;  Location: [place] [facility];  Service: Neurosurgery;  Laterality: N/A;   SUBOCCIPITAL CRANIECTOMY CERVICAL LAMINECTOMY N/A 11/19/2013   Procedure: SUBOCCIPITAL CRANIECTOMY for tumor;  Surgeon: Eustace Moore, MD;  Location: [place] [facility];  Service: Neurosurgery;  Laterality: N/A;   TONSILLECTOMY     VIDEO ASSISTED THORACOSCOPY (VATS)/WEDGE RESECTION Left 03/05/2014   Procedure: VIDEO ASSISTED THORACOSCOPY  (VATS)/WEDGE RESECTION;  Surgeon: Melrose Nakayama, MD;  Location: [facility] OR;  Service: Thoracic;  Laterality: Left;    Current Medications: Current Meds  Medication Sig   alendronate (FOSAMAX) 70 MG tablet Take 70 mg by mouth once a week.   amoxicillin (AMOXIL) 500 MG capsule Take 500 mg by mouth 3 (three) times daily.   aspirin EC 81 MG tablet Take 1 tablet (81 mg total) by mouth daily. Swallow whole.   D 1000 25 MCG (1000 UT) capsule Take 2,000 Units by mouth daily.   gabapentin (NEURONTIN) 600 MG tablet Take 900 mg by mouth at bedtime.    hydrocortisone (CORTEF) 5 MG tablet Take 10 mg in am and 5 mg in pm.   hydrocortisone sodium succinate (SOLU-CORTEF) 100 MG SOLR injection Inject into the muscle.   levothyroxine (SYNTHROID, LEVOTHROID) 75 MCG tablet Take 75 mcg by mouth daily before breakfast.   Multiple Vitamin (MULTI-VITAMIN) tablet Take 1 tablet by mouth daily.   potassium chloride SA (KLOR-CON M) 20 MEQ tablet Take 20 mEq by mouth daily. Pt takes 2 tablet 40 meg once a day.   rosuvastatin (CRESTOR) 5 MG tablet Take 1 tablet (5 mg total) by mouth daily.   UNABLE TO FIND Blood Work: Basic Metabolic Panel in 1 week. Dx: Hypokalemia. Results to PCP: Dr. Olen Pel.     Allergies:   Morphine and related and Scopolamine   Social History   Socioeconomic History   Marital status: Married    Spouse  name: Not on file   Number of children: Not on file   Years of education: Not on file   Highest education level: Not on file  Occupational History   Not on file  Tobacco Use   Smoking status: Former    Types: Cigarettes    Quit date: 11/19/2013    Years since quitting: 9.2   Smokeless tobacco: Never  Vaping Use   Vaping Use: Never used  Substance and Sexual Activity   Alcohol use: No    Alcohol/week: 0.0 standard drinks of alcohol   Drug use: No   Sexual activity: Not on file  Other Topics Concern   Not on file  Social History Narrative   Right handed      Highest level of  edu- 1 year of college      Lives with husband in one story home   Social Determinants of Health   Financial Resource Strain: Not on file  Food Insecurity: Not on file  Transportation Needs: Not on file  Physical Activity: Not on file  Stress: Not on file  Social Connections: Not on file     Family History: The patient's family history includes Cancer - Other in an other family member; Dementia in her mother.  ROS:   Please see the history of present illness.     All other systems reviewed and are negative.  EKGs/Labs/Other Studies Reviewed:    The following studies were reviewed today: Cardiac Studies & Procedures       ECHOCARDIOGRAM  ECHOCARDIOGRAM COMPLETE 04/02/2017  Narrative *[facility]* [address], [place] [address] [phone]  ------------------------------------------------------------------- Transthoracic Echocardiography  Patient:    Mallerie, Radford MR #:       [id number] Study Date: 04/02/2017 Gender:     F Age:        29 Height:     170.2 cm Weight:     54.6 kg BSA:        1.6 m^2 Pt. Status: Room:       [place], [place] SONOGRAPHER  Tresa Res, RDCS ADMITTING    Lady Deutscher PERFORMING   [facility], Inpatient  cc:  ------------------------------------------------------------------- LV EF: 65% -   70%  ------------------------------------------------------------------- Indications:      Chest pain 786.51.  ------------------------------------------------------------------- History:   Risk factors:  Lung Cancer. Brain Cancer. Dementia.  ------------------------------------------------------------------- Study Conclusions  - Left ventricle: The cavity size was normal. Systolic function was vigorous. The estimated ejection fraction was in the range of 65% to 70%. Wall motion was normal; there were no  regional wall motion abnormalities. There was an increased relative contribution of atrial contraction to ventricular filling. Doppler parameters are consistent with abnormal left ventricular relaxation (grade 1 diastolic dysfunction). - Aortic valve: Trileaflet; mildly thickened, mildly calcified leaflets. - Atrial septum: There was increased thickness of the septum, consistent with lipomatous hypertrophy. - Tricuspid valve: There was trivial regurgitation.  ------------------------------------------------------------------- Study data:   Study status:  Routine.  Study completion:  There were no complications.          Transthoracic echocardiography. M-mode, complete 2D, spectral Doppler, and color Doppler. Birthdate:  Patient birthdate: 05/22/1961.  Age:  Patient is 62 yr old.  Sex:  Gender: female.    BMI: 18.9 kg/m^2.  Blood pressure: 107/65  Patient status:  Inpatient.  Study date:  Study date: 04/02/2017. Study time: 09:27 AM.  Location:  Bedside.  -------------------------------------------------------------------  ------------------------------------------------------------------- Left ventricle:  The cavity size was normal. Systolic function was vigorous. The estimated ejection fraction was in the range of 65% to 70%. Wall motion was normal; there were no regional wall motion abnormalities. There was an increased relative contribution of atrial contraction to ventricular filling. Doppler parameters are consistent with abnormal left ventricular relaxation (grade 1 diastolic dysfunction).  ------------------------------------------------------------------- Aortic valve:   Trileaflet; mildly thickened, mildly calcified leaflets. Mobility was not restricted.  Doppler:  Transvalvular velocity was within the normal range. There was no stenosis. There was no regurgitation.  ------------------------------------------------------------------- Aorta:  Aortic root: The aortic root  was normal in size.  ------------------------------------------------------------------- Mitral valve:   Structurally normal valve.   Mobility was not restricted.  Doppler:  Transvalvular velocity was within the normal range. There was no evidence for stenosis. There was no regurgitation.  ------------------------------------------------------------------- Left atrium:  The atrium was normal in size.  ------------------------------------------------------------------- Atrial septum:  There was increased thickness of the septum, consistent with lipomatous hypertrophy.  ------------------------------------------------------------------- Right ventricle:  The cavity size was normal. Wall thickness was normal. Systolic function was normal.  ------------------------------------------------------------------- Pulmonic valve:    Structurally normal valve.   Cusp separation was normal.  Doppler:  Transvalvular velocity was within the normal range. There was no evidence for stenosis. There was no regurgitation.  ------------------------------------------------------------------- Tricuspid valve:   Structurally normal valve.   Leaflet separation was normal.  Doppler:  Transvalvular velocity was within the normal range. There was trivial regurgitation.  ------------------------------------------------------------------- Pulmonary artery:   The main pulmonary artery was normal-sized. Systolic pressure was within the normal range.  ------------------------------------------------------------------- Right atrium:  The atrium was normal in size.  ------------------------------------------------------------------- Pericardium:  There was no pericardial effusion.  ------------------------------------------------------------------- Systemic veins: Inferior vena cava: The vessel was normal in size.  ------------------------------------------------------------------- Measurements  Left  ventricle                           Value        Reference LV ID, ED, PLAX chordal        (L)       35.1  mm     43 - 52 LV ID, ES, PLAX chordal                  24.4  mm     23 - 38 LV fx shortening, PLAX chordal           30    %      >=29 LV PW thickness, ED                      8.13  mm     --------- IVS/LV PW ratio, ED                      1.16         <=1.3 Stroke volume, 2D                        60    ml     --------- Stroke volume/bsa, 2D                    38    ml/m^2 --------- LV e&', lateral  8.38  cm/s   --------- LV E/e&', lateral                         6.72         --------- LV e&', medial                            6.32  cm/s   --------- LV E/e&', medial                          8.91         --------- LV e&', average                           7.35  cm/s   --------- LV E/e&', average                         7.66         ---------  Ventricular septum                       Value        Reference IVS thickness, ED                        9.46  mm     ---------  LVOT                                     Value        Reference LVOT ID, S                               20    mm     --------- LVOT area                                3.14  cm^2   --------- LVOT peak velocity, S                    104   cm/s   --------- LVOT mean velocity, S                    70.4  cm/s   --------- LVOT VTI, S                              19.2  cm     ---------  Aorta                                    Value        Reference Aortic root ID, ED                       32    mm     ---------  Left atrium                              Value  Reference LA ID, A-P, ES                           25    mm     --------- LA ID/bsa, A-P                           1.56  cm/m^2 <=2.2 LA volume, S                             25.8  ml     --------- LA volume/bsa, S                         16.1  ml/m^2 --------- LA volume, ES, 1-p A4C                   13.9  ml     --------- LA  volume/bsa, ES, 1-p A4C               8.7   ml/m^2 --------- LA volume, ES, 1-p A2C                   41.4  ml     --------- LA volume/bsa, ES, 1-p A2C               25.9  ml/m^2 ---------  Mitral valve                             Value        Reference Mitral E-wave peak velocity              56.3  cm/s   --------- Mitral A-wave peak velocity              59.6  cm/s   --------- Mitral deceleration time                 208   ms     150 - 230 Mitral E/A ratio, peak                   0.9          ---------  Right ventricle                          Value        Reference TAPSE                                    18.3  mm     --------- RV s&', lateral, S                        13.2  cm/s   ---------  Legend: (L)  and  (H)  Herman Mell values outside specified reference range.  ------------------------------------------------------------------- Prepared and Electronically Authenticated by  Fransico Him, MD 2018-05-06T12:31:07     CT SCANS  CT CORONARY MORPH W/CTA COR W/SCORE 10/24/2022  Addendum 10/24/2022 10:01 AM ADDENDUM REPORT: 10/24/2022 09:58  EXAM: OVER-READ INTERPRETATION  CT CHEST  The following report is an over-read performed by radiologist Dr. Jason Nest [facility] on 10/24/2022. This over-read does not include interpretation of cardiac or  coronary anatomy or pathology. The coronary CTA interpretation by the cardiologist is attached.  COMPARISON:  CT chest 04/02/2017  FINDINGS: Vascular: No significant extracardiac vascular findings.  Mediastinum/Nodes: No lymphadenopathy.  Lungs/Pleura: Right middle lobe and lingular subsegmental atelectasis. No suspicious pulmonary nodules. Calcified granuloma in the medial left lower lobe.  Upper Abdomen: No acute abnormality.  Musculoskeletal: No acute osseous abnormality. No suspicious osseous lesion.  IMPRESSION: No significant extracardiac findings of the chest.   Electronically Signed By: Maurine Simmering  M.D. On: 10/24/2022 09:58  Narrative HISTORY: 62 yo female with chest pain, nonspecific  EXAM: Cardiac/Coronary CTA  TECHNIQUE: The patient was scanned on a [employer].  PROTOCOL: A 120 kV prospective scan was triggered in the descending thoracic aorta at 111 HU's. Axial non-contrast 3 mm slices were carried out through the heart. The data set was analyzed on a dedicated work station and scored using the [place]. Gantry rotation speed was 250 msecs and collimation was .6 mm. Beta blockade and 0.8 mg of sl NTG was given. The 3D data set was reconstructed in 5% intervals of the 35-75 % of the R-R cycle. Diastolic phases were analyzed on a dedicated work station using MPR, MIP and VRT modes. The patient received 175m OMNIPAQUE IOHEXOL 350 MG/ML SOLN of contrast.  FINDINGS: Quality: Very good, HR 56  Coronary calcium score: The patient's coronary artery calcium score is 98, which places the patient in the 88th percentile.  Coronary arteries: Normal coronary origins.  Right dominance.  Right Coronary Artery: Dominant.  No disease.  Left Main Coronary Artery: Normal. Bifurcates into the LAD and LCx arteries.  Left Anterior Descending Coronary Artery: Large anterior artery which wraps around the apex. Mild mixed proximal stenosis (1-24%). Several small diagonal branches without disease.  Left Circumflex Artery: AV groove LCX artery. No disease. Large OM branch without disease.  Aorta: Normal size, 30 mm at the mid ascending aorta (level of the PA bifurcation) measured double oblique. Aortic atherosclerosis. No dissection.  Aortic Valve: Trileaflet. No calcifications.  Other findings:  Normal pulmonary vein drainage into the left atrium.  Normal left atrial appendage without a thrombus.  Normal size of the pulmonary artery.  IMPRESSION: 1. Minimal mixed non-obstructive CAD, CADRADS = 1.  2. Coronary calcium score of 98. This was 88th percentile  for age and sex matched control.  3. Normal coronary origin with right dominance.  4. Consider non-coronary causes of chest pain.  Electronically Signed: By: KPixie CasinoM.D. On: 10/21/2022 14:46           EKG:  09/1122 demonstrated nonspecific J-point and ST abnormality, diffuse. Today's tracing of 10/10/2022 sinus rhythm, heart rate 68 bpm, normal in appearance.   Recent Labs: 10/08/2022: BUN 13; Creatinine, Ser 0.81; Hemoglobin 15.3; Platelets 278; Potassium 4.2; Sodium 137 12/22/2022: ALT 16  Recent Lipid Panel    Component Value Date/Time   CHOL 139 12/22/2022 1000   TRIG 140 12/22/2022 1000   HDL 59 12/22/2022 1000   CHOLHDL 2.4 12/22/2022 1000   LDLCALC 56 12/22/2022 1000     Risk Assessment/Calculations:               Physical Exam:    VS:  BP (!) 83/54   Pulse 85   Ht '5\' 7"'$  (1.702 m)   Wt 115 lb (52.2 kg)   SpO2 93%   BMI 18.01 kg/m     Wt Readings from Last 3 Encounters:  02/01/23 115 lb (52.2 kg)  10/10/22  114 lb 3.2 oz (51.8 kg)  10/08/22 115 lb (52.2 kg)     GEN:  Well nourished, well developed in no acute distress HEENT: Normal NECK: No JVD; No carotid bruits LYMPHATICS: No lymphadenopathy CARDIAC: RRR, no murmurs, rubs, gallops RESPIRATORY:  Clear to auscultation without rales, wheezing or rhonchi  ABDOMEN: Soft, non-tender, non-distended MUSCULOSKELETAL:  No edema; No deformity  SKIN: Warm and dry NEUROLOGIC:  Alert and oriented x 3 PSYCHIATRIC:  Normal affect   ASSESSMENT:    1. Hyperlipidemia LDL goal <70   2. Elevated coronary artery calcium score   3. Other chest pain   4. Cerebellar mass    PLAN:    In order of problems listed above:  Melanoma Lung, Brain, craniotomy  Addison's disease or adrenal insufficiency - Blood pressure soft.  Okay to salt food.  She takes hydrocortisone daily.  Coronary calcium - Noted on CT scan as above.  No flow-limiting coronary artery disease.  She is on low-dose rosuvastatin 5 mg  a day.  Excellent LDL of 56.        Medication Adjustments/Labs and Tests Ordered: Current medicines are reviewed at length with the patient today.  Concerns regarding medicines are outlined above.  No orders of the defined types were placed in this encounter.  No orders of the defined types were placed in this encounter.   Patient Instructions  Medication Instructions:   Your physician recommends that you continue on your current medications as directed. Please refer to the Current Medication list given to you today.   *If you need a refill on your cardiac medications before your next appointment, please call your pharmacy*   Lab Work:  None ordered.  If you have labs (blood work) drawn today and your tests are completely normal, you will receive your results only by: [place] (if you have MyChart) OR A paper copy in the mail If you have any lab test that is abnormal or we need to change your treatment, we will call you to review the results.   Testing/Procedures:  None ordered.   Follow-Up: At [facility], you and your health needs are our priority.  As part of our continuing mission to provide you with exceptional heart care, we have created designated Provider Care Teams.  These Care Teams include your primary Cardiologist (physician) and Advanced Practice Providers (APPs -  Physician Assistants and Nurse Practitioners) who all work together to provide you with the care you need, when you need it.  We recommend signing up for the patient portal called "MyChart".  Sign up information is provided on this After Visit Summary.  MyChart is used to connect with patients for Virtual Visits (Telemedicine).  Patients are able to view lab/test results, encounter notes, upcoming appointments, etc.  Non-urgent messages can be sent to your provider as well.   To learn more about what you can do with MyChart, go to [email or web address].    Your next appointment:    1 year(s)  Provider:   Candee Furbish, MD    Other Instructions  Your physician wants you to follow-up in: 1 year with Dr.Nydia Ytuarte.  You will receive a reminder letter in the mail two months in advance. If you don't receive a letter, please call our office to schedule the follow-up appointment.     Signed, Candee Furbish, MD  02/01/2023 1:26 PM    [place]

## 2023-02-02 DIAGNOSIS — H0012 Chalazion right lower eyelid: Secondary | ICD-10-CM | POA: Diagnosis not present

## 2023-02-02 DIAGNOSIS — H53462 Homonymous bilateral field defects, left side: Secondary | ICD-10-CM | POA: Diagnosis not present

## 2023-02-02 DIAGNOSIS — H10403 Unspecified chronic conjunctivitis, bilateral: Secondary | ICD-10-CM | POA: Diagnosis not present

## 2023-03-10 ENCOUNTER — Ambulatory Visit: Payer: Medicare HMO | Admitting: Cardiology

## 2023-03-20 DIAGNOSIS — E039 Hypothyroidism, unspecified: Secondary | ICD-10-CM | POA: Diagnosis not present

## 2023-03-20 DIAGNOSIS — E559 Vitamin D deficiency, unspecified: Secondary | ICD-10-CM | POA: Diagnosis not present

## 2023-03-20 DIAGNOSIS — M810 Age-related osteoporosis without current pathological fracture: Secondary | ICD-10-CM | POA: Diagnosis not present

## 2023-03-20 DIAGNOSIS — E2740 Unspecified adrenocortical insufficiency: Secondary | ICD-10-CM | POA: Diagnosis not present

## 2023-05-26 DIAGNOSIS — C7931 Secondary malignant neoplasm of brain: Secondary | ICD-10-CM | POA: Diagnosis not present

## 2023-05-31 DIAGNOSIS — G4700 Insomnia, unspecified: Secondary | ICD-10-CM | POA: Diagnosis not present

## 2023-05-31 DIAGNOSIS — Z5320 Procedure and treatment not carried out because of patient's decision for unspecified reasons: Secondary | ICD-10-CM | POA: Diagnosis not present

## 2023-05-31 DIAGNOSIS — M818 Other osteoporosis without current pathological fracture: Secondary | ICD-10-CM | POA: Diagnosis not present

## 2023-05-31 DIAGNOSIS — Z1239 Encounter for other screening for malignant neoplasm of breast: Secondary | ICD-10-CM | POA: Diagnosis not present

## 2023-05-31 DIAGNOSIS — E46 Unspecified protein-calorie malnutrition: Secondary | ICD-10-CM | POA: Diagnosis not present

## 2023-05-31 DIAGNOSIS — Z0000 Encounter for general adult medical examination without abnormal findings: Secondary | ICD-10-CM | POA: Diagnosis not present

## 2023-05-31 DIAGNOSIS — E039 Hypothyroidism, unspecified: Secondary | ICD-10-CM | POA: Diagnosis not present

## 2023-05-31 DIAGNOSIS — Z1211 Encounter for screening for malignant neoplasm of colon: Secondary | ICD-10-CM | POA: Diagnosis not present

## 2023-05-31 DIAGNOSIS — E2740 Unspecified adrenocortical insufficiency: Secondary | ICD-10-CM | POA: Diagnosis not present

## 2023-05-31 DIAGNOSIS — I700 Atherosclerosis of aorta: Secondary | ICD-10-CM | POA: Diagnosis not present

## 2023-06-12 DIAGNOSIS — G43719 Chronic migraine without aura, intractable, without status migrainosus: Secondary | ICD-10-CM | POA: Diagnosis not present

## 2023-08-24 DIAGNOSIS — S2231XA Fracture of one rib, right side, initial encounter for closed fracture: Secondary | ICD-10-CM | POA: Diagnosis not present

## 2023-09-28 DIAGNOSIS — G40A09 Absence epileptic syndrome, not intractable, without status epilepticus: Secondary | ICD-10-CM | POA: Diagnosis not present

## 2023-09-28 DIAGNOSIS — Z681 Body mass index (BMI) 19 or less, adult: Secondary | ICD-10-CM | POA: Diagnosis not present

## 2023-10-02 DIAGNOSIS — C439 Malignant melanoma of skin, unspecified: Secondary | ICD-10-CM | POA: Diagnosis not present

## 2023-10-02 DIAGNOSIS — C7931 Secondary malignant neoplasm of brain: Secondary | ICD-10-CM | POA: Diagnosis not present

## 2023-10-02 DIAGNOSIS — Z87891 Personal history of nicotine dependence: Secondary | ICD-10-CM | POA: Diagnosis not present

## 2023-10-02 DIAGNOSIS — C7802 Secondary malignant neoplasm of left lung: Secondary | ICD-10-CM | POA: Diagnosis not present

## 2023-10-02 DIAGNOSIS — E039 Hypothyroidism, unspecified: Secondary | ICD-10-CM | POA: Diagnosis not present

## 2023-11-29 DIAGNOSIS — R569 Unspecified convulsions: Secondary | ICD-10-CM

## 2023-11-29 HISTORY — DX: Unspecified convulsions: R56.9

## 2023-12-04 ENCOUNTER — Encounter: Payer: Self-pay | Admitting: Family Medicine

## 2023-12-04 ENCOUNTER — Ambulatory Visit: Payer: Medicare HMO | Admitting: Family Medicine

## 2023-12-04 VITALS — BP 96/60 | HR 73 | Temp 97.8°F | Ht 66.54 in | Wt 109.8 lb

## 2023-12-04 DIAGNOSIS — E782 Mixed hyperlipidemia: Secondary | ICD-10-CM | POA: Insufficient documentation

## 2023-12-04 DIAGNOSIS — E2740 Unspecified adrenocortical insufficiency: Secondary | ICD-10-CM

## 2023-12-04 DIAGNOSIS — I700 Atherosclerosis of aorta: Secondary | ICD-10-CM | POA: Insufficient documentation

## 2023-12-04 DIAGNOSIS — G40A09 Absence epileptic syndrome, not intractable, without status epilepticus: Secondary | ICD-10-CM | POA: Insufficient documentation

## 2023-12-04 DIAGNOSIS — C7931 Secondary malignant neoplasm of brain: Secondary | ICD-10-CM | POA: Insufficient documentation

## 2023-12-04 DIAGNOSIS — C7800 Secondary malignant neoplasm of unspecified lung: Secondary | ICD-10-CM

## 2023-12-04 DIAGNOSIS — F419 Anxiety disorder, unspecified: Secondary | ICD-10-CM

## 2023-12-04 DIAGNOSIS — R911 Solitary pulmonary nodule: Secondary | ICD-10-CM | POA: Diagnosis not present

## 2023-12-04 DIAGNOSIS — F4321 Adjustment disorder with depressed mood: Secondary | ICD-10-CM

## 2023-12-04 DIAGNOSIS — E063 Autoimmune thyroiditis: Secondary | ICD-10-CM

## 2023-12-04 DIAGNOSIS — Z23 Encounter for immunization: Secondary | ICD-10-CM

## 2023-12-04 DIAGNOSIS — Z1231 Encounter for screening mammogram for malignant neoplasm of breast: Secondary | ICD-10-CM

## 2023-12-04 DIAGNOSIS — R519 Headache, unspecified: Secondary | ICD-10-CM | POA: Diagnosis not present

## 2023-12-04 HISTORY — DX: Absence epileptic syndrome, not intractable, without status epilepticus: G40.A09

## 2023-12-04 MED ORDER — GABAPENTIN 600 MG PO TABS: 900.0000 mg | ORAL_TABLET | Freq: Every day | ORAL | 1 refills | Status: DC

## 2023-12-04 MED ORDER — ESCITALOPRAM OXALATE 5 MG PO TABS: 5.0000 mg | ORAL_TABLET | Freq: Every day | ORAL | 1 refills | Status: DC

## 2023-12-04 NOTE — Progress Notes (Signed)
 Patient ID: [name] [name], female  DOB: 1961/06/05, 63 y.o.   MRN: [id number] Patient Care Team    Relationship Specialty Notifications Start End  Catherine Charlies DELENA, DO PCP - General Family Medicine  12/04/23   Edmund Mose Koyanagi, MD Referring Physician Radiation Oncology  12/04/23   Jeffrie Oneil BROCKS, MD Consulting Physician Cardiology  12/04/23   Billy Rocky Collar, MD Referring Physician Endocrinology  12/04/23   Winthrop Earing, MD Referring Physician Internal Medicine  12/04/23    Comment: oncologist for Melanoma  [name]    12/04/23     Chief Complaint  Patient presents with   Establish Care    Concerned she may have absent seizures; grieving     Subjective:  [name] [name] is a 63 y.o.  female present for new patient establishment. All past medical history, surgical history, allergies, family history, immunizations, medications and social history were updated in the electronic medical record today. All recent labs, ED visits and hospitalizations within the last year were reviewed. No records were made available prior to patient's visit.  Prior PCP: Thresa Sever, PA-C- [facility]  Malignant melanoma metastatic to brain [facility]) (Primary)/melanoma metastatic to lung Patient is established with oncologist for management.  Grief/anxiety Patient reports she is currently under a lot of stress and is grieving the loss of her husband.  Her husband passed away unexpectedly 10/18/2024after being diagnosed with cancer.  She states after diagnosis and only lived for a few weeks.  She and her husband have been together since they were teenagers.  She is feeling lonely.  She has never been on medications to help with anxiety or depression in the past.  She states she is always been like the person that is high strung.  She is having difficulty adjusting to a life on her own, but states she is going to continue to try to adapt.  Chronic daily headache Patient  reports she had chronic headaches for many years.  These headaches are even prior to her melanoma metastasis to her brain.  She has been under the care with a headache specialist during this time as prescribed gabapentin  900 mg nightly, along with a breakthrough gabapentin  100 mg daily.  She would like these medications to be managed by this provider today.  She states this regimen is working well for her headaches.  Adrenal insufficiency (HCC)/hypothyroidism Patient reports she follows with endocrinology who prescribes her levothyroxine  for her hypothyroidism and the hydrocortisone  for her adrenal insufficiency.  Absence seizure (HCC) This seems to be potentially new diagnosis for patient.  She has an establishment visit already put in place with [facility] on 01/09/2024 to address concerns.  No prior records available.  Mixed hyperlipidemia/aortic atherosclerosis Patient is established with cardiology.  Cardiology prescribes patient's Crestor  5 mg daily.  Patient takes a baby aspirin  daily.       12/04/2023   11:23 AM 10/27/2016   10:36 AM 07/11/2016    3:26 PM 03/29/2016    2:35 PM 12/23/2015    3:05 PM  Depression screen PHQ 2/9  Decreased Interest 0 0 1 0 0  Down, Depressed, Hopeless 0 0  0 0  PHQ - 2 Score 0 0 1 0 0       No data to display                   12/04/2023   11:23 AM 08/06/2018   10:24 AM 08/04/2017  11:30 AM 10/27/2016   10:36 AM 07/19/2016    3:33 PM  Fall Risk   Falls in the past year? 0 No No No No  Number falls in past yr: 0      Injury with Fall? 0      Risk for fall due to : No Fall Risks      Follow up Falls evaluation completed         Immunization History  Administered Date(s) Administered   Influenza, Seasonal, Injecte, Preservative Fre 09/01/2014, 12/04/2023   Influenza,inj,Quad PF,6+ Mos 12/06/2013, 08/01/2018, 07/31/2019   Influenza-Unspecified 08/29/2016, 08/10/2020, 08/17/2022   PFIZER Comirnaty(Gray Top)Covid-19  Tri-Sucrose Vaccine 02/14/2020, 03/06/2020, 07/19/2021, 09/27/2021   PFIZER(Purple Top)SARS-COV-2 Vaccination 09/03/2020, 02/01/2021   Pfizer(Comirnaty)Fall Seasonal Vaccine 12 years and older 04/12/2022, 08/17/2022   Pneumococcal Polysaccharide-23 11/16/2013   Tdap 06/03/2015    No results found.  Past Medical History:  Diagnosis Date   Acute encephalopathy 03/21/2015   Anxiety    Brain cancer (HCC)    awaiting pathology; assuming this is a lung primary with brain mets   Candidiasis of mouth 12/16/2013   Colitis 03/17/2014   Dementia (HCC)    GERD (gastroesophageal reflux disease)    Hypothyroidism    Left homonymous hemianopsia    secondary to right posterior temporal and occipital lesions.   Lung cancer (HCC)    awaiting pathology   Melanoma (HCC)    With metastasis to lung and brain   Other headache syndrome    TMJ (dislocation of temporomandibular joint)    Allergies  Allergen Reactions   Morphine  And Codeine  Nausea Only   Scopolamine Rash   Past Surgical History:  Procedure Laterality Date   APPENDECTOMY  1979   LUMBAR WOUND DEBRIDEMENT N/A 12/05/2013   Procedure: irrigation and debridement posterior cervical wound;  Surgeon: Alm GORMAN Molt, MD;  Location: [facility];  Service: Neurosurgery;  Laterality: N/A;   SUBOCCIPITAL CRANIECTOMY CERVICAL LAMINECTOMY N/A 11/19/2013   Procedure: SUBOCCIPITAL CRANIECTOMY for tumor;  Surgeon: Alm GORMAN Molt, MD;  Location: [facility];  Service: Neurosurgery;  Laterality: N/A;   TONSILLECTOMY     VIDEO ASSISTED THORACOSCOPY (VATS)/WEDGE RESECTION Left 03/05/2014   Procedure: VIDEO ASSISTED THORACOSCOPY (VATS)/WEDGE RESECTION;  Surgeon: Elspeth JAYSON Millers, MD;  Location: [facility] OR;  Service: Thoracic;  Laterality: Left;   Family History  Problem Relation Age of Onset   Alzheimer's disease Mother    Social History   Social History Narrative   Marital status/children/pets: Widowed.  No children.   Education/employment: 2  years of college, retired/disabled   Right handed       Allergies as of 12/04/2023       Reactions   Morphine  And Codeine  Nausea Only   Scopolamine Rash        Medication List        Accurate as of December 04, 2023 11:59 PM. If you have any questions, ask your nurse or doctor.          STOP taking these medications    amoxicillin 500 MG capsule Commonly known as: AMOXIL Stopped by: Isiaih Hollenbach   bisacodyl  5 MG EC tablet Commonly known as: DULCOLAX Stopped by: Charlies Bellini   hydrocortisone  sodium succinate 100 MG Solr injection Commonly known as: SOLU-CORTEF  Stopped by: Zowie Lundahl   metoprolol  tartrate 50 MG tablet Commonly known as: LOPRESSOR  Stopped by: Charlies Bellini   potassium chloride  SA 20 MEQ tablet Commonly known as: KLOR-CON  M Stopped by: Charlies Bellini  traMADol  50 MG tablet Commonly known as: ULTRAM  Stopped by: Charlies Bellini   UNABLE TO FIND Stopped by: Charlies Bellini       TAKE these medications    alendronate 70 MG tablet Commonly known as: FOSAMAX Take 70 mg by mouth once a week.   Alkindi  Sprinkle 5 MG Cpsp Generic drug: Hydrocortisone  What changed: Another medication with the same name was removed. Continue taking this medication, and follow the directions you see here. Changed by: Charlies Bellini   aspirin  EC 81 MG tablet Take 1 tablet (81 mg total) by mouth daily. Swallow whole.   D 1000 25 MCG (1000 UT) capsule Generic drug: Cholecalciferol Take 2,000 Units by mouth daily.   escitalopram  5 MG tablet Commonly known as: Lexapro  Take 1 tablet (5 mg total) by mouth daily. Started by: Charlies Bellini   gabapentin  100 MG capsule Commonly known as: NEURONTIN  Take by mouth.   gabapentin  600 MG tablet Commonly known as: NEURONTIN  Take 1.5 tablets (900 mg total) by mouth at bedtime.   levothyroxine  75 MCG tablet Commonly known as: SYNTHROID  Take 75 mcg by mouth daily before breakfast.   Multi-Vitamin tablet Take 1 tablet by  mouth daily.   potassium chloride  10 MEQ tablet Commonly known as: KLOR-CON  Take 20 mEq by mouth daily.   rosuvastatin  5 MG tablet Commonly known as: CRESTOR  Take 1 tablet (5 mg total) by mouth daily.   Super Calcium  1500 (600 Ca) MG Tabs tablet Generic drug: calcium  carbonate 1 tablet with meals Orally Twice a day        All past medical history, surgical history, allergies, family history, immunizations andmedications were updated in the EMR today and reviewed under the history and medication portions of their EMR.    No results found for this or any previous visit (from the past 2160 hours).  CT CORONARY MORPH W/CTA COR W/SCORE W/CA W/CM &/OR WO/CM Result Date: 10/24/2022 IMPRESSION:  1. Minimal mixed non-obstructive CAD, CADRADS = 1.  2. Coronary calcium  score of 98. This was 88th percentile for age and sex matched control.  3. Normal coronary origin with right dominance.     ROS 14 pt review of systems performed and negative (unless mentioned in an HPI)  Objective: BP 96/60   Pulse 73   Temp 97.8 F (36.6 C)   Ht 5' 6.54 (1.69 m)   Wt 109 lb 12.8 oz (49.8 kg)   SpO2 96%   BMI 17.44 kg/m  Physical Exam Vitals and nursing note reviewed.  Constitutional:      General: She is not in acute distress.    Appearance: Normal appearance. She is not ill-appearing, toxic-appearing or diaphoretic.     Comments: Very pleasant female.  HENT:     Head: Normocephalic and atraumatic.  Eyes:     General: No scleral icterus.       Right eye: No discharge.        Left eye: No discharge.     Extraocular Movements: Extraocular movements intact.     Conjunctiva/sclera: Conjunctivae normal.     Pupils: Pupils are equal, round, and reactive to light.  Cardiovascular:     Rate and Rhythm: Normal rate and regular rhythm.     Heart sounds: No murmur heard. Pulmonary:     Effort: Pulmonary effort is normal. No respiratory distress.     Breath sounds: Normal breath sounds. No  wheezing, rhonchi or rales.  Musculoskeletal:     Right lower leg: No edema.  Left lower leg: No edema.  Skin:    General: Skin is warm.     Findings: No rash.  Neurological:     Mental Status: She is alert and oriented to person, place, and time. Mental status is at baseline.     Motor: No weakness.     Gait: Gait normal.  Psychiatric:        Attention and Perception: Attention normal.        Mood and Affect: Mood normal. Affect is tearful.        Behavior: Behavior normal. Behavior is cooperative.        Thought Content: Thought content normal.        Cognition and Memory: Cognition normal.        Judgment: Judgment normal.     Comments: Grieving      Assessment/plan: [name] [name] is a 63 y.o. female present for establish care Malignant melanoma metastatic to brain [facility]) (Primary)/melanoma metastatic to lung Patient is established with oncologist with regular routine visits and imaging.  Need for influenza vaccination - Flu vaccine trivalent PF, 6mos and older(Flulaval,Afluria,Fluarix,Fluzone)  Grief/anxiety Patient recently lost her husband in October 2024 to cancer.  His passing was sudden after not expected diagnosis of cancer. Recommended grief sharing at her church.  She declined therapy referral today.  Start Lexapro  5 mg daily  Chronic daily headache Patient has been established with headache specialist for many years. Would like this provider to take over gabapentin , agreed to do so for her. Gabapentin  900 mg nightly, gabapentin  100 mg daily  Adrenal insufficiency (HCC)/hypothyroidism Conditions are managed by endocrine. Patient prescribed chronic hydrocortisone  by endocrine Patient prescribed levothyroxine  75 mcg by endocrine.  Absence seizure [facility]) Patient has neurology established appointment 01/09/2024 with [facility] for evaluation.  Mixed hyperlipidemia/aortic atherosclerosis Patient established with cardiology-Dr.  Jeffrie Continue Crestor  5 mg daily-cardiology provides  Osteoporosis/vitamin D  deficiency: Patient is prescribed Fosamax 70 mg weekly. She supplements with vitamin D  and calcium . Managed by her endocrine team DEXA completed 05/30/2022 (-3.0)-managed by endocrine  Breast cancer screening by mammogram: Last completed 2023-order placed for breast center  Return in about 11 weeks (around 02/19/2024) for Routine chronic condition follow-up.  Orders Placed This Encounter  Procedures   Flu vaccine trivalent PF, 6mos and older(Flulaval,Afluria,Fluarix,Fluzone)   Meds ordered this encounter  Medications   escitalopram  (LEXAPRO ) 5 MG tablet    Sig: Take 1 tablet (5 mg total) by mouth daily.    Dispense:  90 tablet    Refill:  1   gabapentin  (NEURONTIN ) 600 MG tablet    Sig: Take 1.5 tablets (900 mg total) by mouth at bedtime.    Dispense:  135 tablet    Refill:  1   Referral Orders  No referral(s) requested today     Note is dictated utilizing voice recognition software. Although note has been proof read prior to signing, occasional typographical errors still can be missed. If any questions arise, please do not hesitate to call for verification.  Electronically signed by: Charlies Bellini, DO [facility] [place]

## 2023-12-04 NOTE — Patient Instructions (Addendum)
 Return in about 11 weeks (around 02/19/2024) for Routine chronic condition follow-up. On grief and lexapro  start        Great to see you today.  I have refilled the medication(s) we provide.   If labs were collected or images ordered, we will inform you of  results once we have received them and reviewed. We will contact you either by echart message, or telephone call.  Please give ample time to the testing facility, and our office to run,  receive and review results. Please do not call inquiring of results, even if you can see them in your chart. We will contact you as soon as we are able. If it has been over 1 week since the test was completed, and you have not yet heard from us , then please call us .    - echart message- for normal results that have been seen by the patient already.   - telephone call: abnormal results or if patient has not viewed results in their echart.  If a referral to a specialist was entered for you, please call us  in 2 weeks if you have not heard from the specialist office to schedule.

## 2023-12-07 ENCOUNTER — Encounter: Payer: Self-pay | Admitting: Family Medicine

## 2023-12-07 DIAGNOSIS — F4381 Prolonged grief disorder: Secondary | ICD-10-CM | POA: Insufficient documentation

## 2023-12-07 DIAGNOSIS — F4321 Adjustment disorder with depressed mood: Secondary | ICD-10-CM | POA: Insufficient documentation

## 2023-12-07 MED ORDER — GABAPENTIN 100 MG PO CAPS: 100.0000 mg | ORAL_CAPSULE | Freq: Every day | ORAL | 1 refills | Status: DC | PRN

## 2023-12-15 ENCOUNTER — Encounter: Payer: Self-pay | Admitting: Family Medicine

## 2023-12-20 ENCOUNTER — Encounter: Payer: Self-pay | Admitting: Family Medicine

## 2023-12-20 DIAGNOSIS — M810 Age-related osteoporosis without current pathological fracture: Secondary | ICD-10-CM | POA: Insufficient documentation

## 2024-01-09 ENCOUNTER — Encounter: Payer: Self-pay | Admitting: Neurology

## 2024-01-09 ENCOUNTER — Ambulatory Visit: Payer: Medicare HMO | Admitting: Neurology

## 2024-01-09 VITALS — BP 102/70 | HR 69 | Ht 67.0 in | Wt 110.8 lb

## 2024-01-09 DIAGNOSIS — R569 Unspecified convulsions: Secondary | ICD-10-CM

## 2024-01-09 NOTE — Progress Notes (Signed)
[name]  PATIENT: [name] [name] DOB: 1961-08-01  REQUESTING CLINICIAN: Arman Bogus, MD HISTORY FROM: Patient and sister  REASON FOR VISIT: Event concerning for seizure   HISTORICAL  CHIEF COMPLAINT:  Chief Complaint  Patient presents with   New Patient (Initial Visit)    Pt in 13, here with sister Edwyna Shell Pt is referred by Dr Marikay Alar for possible absence seizures. Pt states she has been episodes where she has starring spells and slurred speech that last for about 10 seconds. States they usually happen in the afternoon.     HISTORY OF PRESENT ILLNESS:  This is a 63 year old right handed woman with past medical history of melanoma that metastasized to the brain status post craniectomy 10 years ago, adrenal insufficiency, depression following the death of her husband who is presenting with events concerning for seizure.  Per sister, for the past year and a half, patient has been having episodes where she will slur her speech, lasting about 10 seconds.  Patient is not aware of these events.  During these events, she has slurred speech, unintelligible speech for about 10-seconds, after that her speech will normalize and she will act like nothing happened.  When asked about the events, she does not have any recollection.  She denies losing consciousness with this episodes, denies any staring spells, denies any generalized convulsion and denies any falls.  She denies any history of seizures and no family history of seizures.    OTHER MEDICAL CONDITIONS: History of Melanoma with mets to the brain s/p craniotomy 10 year ago, Adrenal insufficiency,    REVIEW OF SYSTEMS: Full 14 system review of systems performed and negative with exception of: As noted in the HPI   ALLERGIES: Allergies  Allergen Reactions   Morphine And Codeine Nausea Only   Scopolamine Rash    HOME MEDICATIONS: Outpatient Medications Prior to Visit  Medication Sig Dispense Refill    alendronate (FOSAMAX) 70 MG tablet Take 70 mg by mouth once a week.     aspirin EC 81 MG tablet Take 1 tablet (81 mg total) by mouth daily. Swallow whole.     calcium carbonate (SUPER CALCIUM) 1500 (600 Ca) MG TABS tablet 1 tablet with meals Orally Twice a day     D 1000 25 MCG (1000 UT) capsule Take 2,000 Units by mouth daily.     escitalopram (LEXAPRO) 5 MG tablet Take 1 tablet (5 mg total) by mouth daily. 90 tablet 1   gabapentin (NEURONTIN) 100 MG capsule Take 1 capsule (100 mg total) by mouth daily as needed. 90 capsule 1   gabapentin (NEURONTIN) 600 MG tablet Take 1.5 tablets (900 mg total) by mouth at bedtime. 135 tablet 1   Hydrocortisone (ALKINDI SPRINKLE) 5 MG CPSP      levothyroxine (SYNTHROID, LEVOTHROID) 75 MCG tablet Take 75 mcg by mouth daily before breakfast.     Multiple Vitamin (MULTI-VITAMIN) tablet Take 1 tablet by mouth daily.     potassium chloride (KLOR-CON) 10 MEQ tablet Take 20 mEq by mouth daily.     rosuvastatin (CRESTOR) 5 MG tablet Take 1 tablet (5 mg total) by mouth daily. 90 tablet 3   No facility-administered medications prior to visit.    PAST MEDICAL HISTORY: Past Medical History:  Diagnosis Date   Abnormal electrocardiogram 07/30/2014   Acute encephalopathy 03/21/2015   Anxiety    Brain cancer (HCC)    Candidiasis of mouth 12/16/2013   Colitis 03/17/2014   GERD (gastroesophageal reflux disease)  Hypothyroidism    Left homonymous hemianopsia    secondary to right posterior temporal and occipital lesions.   Lung cancer (HCC)    Lung nodule 11/16/2013   Metastatic melanoma- wedge resection 03/05/2014     Melanoma (HCC)    With metastasis to lung and brain   Nasal valve collapse 06/23/2021   Other headache syndrome    Pyelonephritis 12/2020   Syncope and collapse 03/31/2015   TMJ (dislocation of temporomandibular joint)     PAST SURGICAL HISTORY: Past Surgical History:  Procedure Laterality Date   APPENDECTOMY  1979   LUMBAR WOUND  DEBRIDEMENT N/A 12/05/2013   Procedure: irrigation and debridement posterior cervical wound;  Surgeon: Tia Alert, MD;  Location: [facility];  Service: Neurosurgery;  Laterality: N/A;   SUBOCCIPITAL CRANIECTOMY CERVICAL LAMINECTOMY N/A 11/19/2013   Procedure: SUBOCCIPITAL CRANIECTOMY for tumor;  Surgeon: Tia Alert, MD;  Location: [facility];  Service: Neurosurgery;  Laterality: N/A;   TONSILLECTOMY     VIDEO ASSISTED THORACOSCOPY (VATS)/WEDGE RESECTION Left 03/05/2014   Procedure: VIDEO ASSISTED THORACOSCOPY (VATS)/WEDGE RESECTION;  Surgeon: Loreli Slot, MD;  Location: [facility] OR;  Service: Thoracic;  Laterality: Left;    FAMILY HISTORY: Family History  Problem Relation Age of Onset   Alzheimer's disease Mother     SOCIAL HISTORY: Social History   Socioeconomic History   Marital status: Widowed    Spouse name: Not on file   Number of children: Not on file   Years of education: Not on file   Highest education level: Not on file  Occupational History   Not on file  Tobacco Use   Smoking status: Former    Current packs/day: 0.00    Types: Cigarettes    Quit date: 11/19/2013    Years since quitting: 10.1   Smokeless tobacco: Never  Vaping Use   Vaping status: Never Used  Substance and Sexual Activity   Alcohol use: No    Alcohol/week: 0.0 standard drinks of alcohol   Drug use: Never   Sexual activity: Not Currently    Partners: Male    Comment: widowed  Other Topics Concern   Not on file  Social History Narrative   Marital status/children/pets: Widowed.  No children.   Education/employment: 2 years of college, retired/disabled   Right handed      Social Drivers of Health   Financial Resource Strain: Low Risk  (03/17/2023)   Received from [facility]   Overall Financial Resource Strain (CARDIA)    Difficulty of Paying Living Expenses: Not very hard  Food Insecurity: No Food Insecurity (03/17/2023)   Received from [facility]   Hunger Vital Sign    Worried About Running Out of Food in the Last Year: Never true    Ran Out of Food in the Last Year: Never true  Transportation Needs: No Transportation Needs (03/17/2023)   Received from [facility] - Transportation    In the past 12 months, has lack of transportation kept you from medical appointments or from getting medications?: No    Lack of Transportation (Non-Medical): No  Physical Activity: Insufficiently Active (08/08/2020)   Received from [facility], [facility]   Exercise Vital Sign    Days of Exercise per Week: 7 days    Minutes of Exercise per Session: 10 min  Stress: No Stress Concern Present (08/08/2020)   Received from [facility], [facility]   [employer] [facility] - Occupational Stress Questionnaire    Feeling of Stress : Only a little  Social Connections: Not on file  Intimate Partner Violence: Not on file    PHYSICAL EXAM   GENERAL EXAM/CONSTITUTIONAL: Vitals:  Vitals:   01/09/24 1309  BP: 102/70  Pulse: 69  Weight: 110 lb 12.8 oz (50.3 kg)  Height: 5\' 7"  (1.702 m)   Body mass index is 17.35 kg/m. Wt Readings from Last 3 Encounters:  01/09/24 110 lb 12.8 oz (50.3 kg)  12/04/23 109 lb 12.8 oz (49.8 kg)  02/01/23 115 lb (52.2 kg)   Patient is in no distress; well developed, nourished and groomed; neck is supple  MUSCULOSKELETAL: Gait, strength, tone, movements noted in Neurologic exam below  NEUROLOGIC: MENTAL STATUS:     04/06/2015    2:33 PM  MMSE - Mini Mental State Exam  Orientation to time 5  Orientation to Place 5  Registration 3  Attention/ Calculation 5  Recall 3  Language- name 2 objects 2  Language- repeat 1  Language- follow 3 step command 3  Language- read & follow direction 1  Write a sentence 1  Copy design 1  Total score 30   awake, alert, oriented to person, place and  time recent and remote memory intact normal attention and concentration language fluent, comprehension intact, naming intact fund of knowledge appropriate  CRANIAL NERVE:  2nd, 3rd, 4th, 6th - Visual fields full to confrontation, extraocular muscles intact, no nystagmus 5th - facial sensation symmetric 7th - facial strength symmetric 8th - hearing intact 9th - palate elevates symmetrically, uvula midline 11th - shoulder shrug symmetric 12th - tongue protrusion midline  MOTOR:  normal bulk and tone, full strength in the BUE, BLE  SENSORY:  normal and symmetric to light touch  COORDINATION:  finger-nose-finger, fine finger movements normal  GAIT/STATION:  normal    DIAGNOSTIC DATA (LABS, IMAGING, TESTING) - I reviewed patient records, labs, notes, testing and imaging myself where available.  Lab Results  Component Value Date   WBC 6.7 10/08/2022   HGB 15.3 (H) 10/08/2022   HCT 45.3 10/08/2022   MCV 91.7 10/08/2022   PLT 278 10/08/2022      Component Value Date/Time   NA 137 10/08/2022 1502   NA 142 02/10/2014 1458   K 4.2 10/08/2022 1502   K 4.6 02/10/2014 1458   CL 107 10/08/2022 1502   CO2 21 (L) 10/08/2022 1502   CO2 24 02/10/2014 1458   GLUCOSE 170 (H) 10/08/2022 1502   GLUCOSE 106 02/10/2014 1458   BUN 13 10/08/2022 1502   BUN 18.6 02/10/2014 1458   CREATININE 0.81 10/08/2022 1502   CREATININE 0.8 02/10/2014 1458   CALCIUM 9.4 10/08/2022 1502   CALCIUM 9.5 02/10/2014 1458   PROT 6.8 12/22/2022 1000   PROT 6.5 02/10/2014 1458   ALBUMIN 4.3 12/22/2022 1000   ALBUMIN TNP 03/21/2015 1633   ALBUMIN 3.6 02/10/2014 1458   AST 22 12/22/2022 1000   AST 15 02/10/2014 1458   ALT 16 12/22/2022 1000   ALT 17 02/10/2014 1458   ALKPHOS 120 12/22/2022 1000   ALKPHOS 105 02/10/2014 1458   BILITOT 0.2 12/22/2022 1000   BILITOT 0.48 02/10/2014 1458   GFRNONAA >60 10/08/2022 1502   GFRAA >60 04/03/2017 0819   Lab Results  Component Value Date   CHOL 139  12/22/2022   HDL 59 12/22/2022   LDLCALC 56 12/22/2022   TRIG 140 12/22/2022  CHOLHDL 2.4 12/22/2022   Lab Results  Component Value Date   HGBA1C 5.7 (H) 03/22/2015   Lab Results  Component Value Date   VITAMINB12 1,407 (H) 03/21/2015   Lab Results  Component Value Date   TSH 7.001 (H) 03/21/2015    MRI Brain 05/26/2023 Stable appearance of encephalomalacia and linear associated enhancement  within the right superior cerebellum. No new or increasing suspicious  enhancement identified.     ASSESSMENT AND PLAN  63 y.o. year old female with past medical history of melanoma with metastasis to the brain status post resection, adrenal insufficiency, who is presenting with events concerning for seizures.  Events are described as slurred and intelligible speech lasting about 10 seconds with return to normal self.  These events are concerning for seizures but the time is too short for focal unaware seizure, and this is atypical for absence seizures due to age.  Will start by obtaining routine EEG, if normal then we will complete a 3-days ambulatory EEG to attempt to capture the events or to capture any abnormality.  I will contact the patient to go over the result.  Advised her to continue following up with her PCP and neurosurgeon and to contact us if she does have another event.  Both patient and her sister are agreeable and comfortable with plans.   1. Seizure-like activity [facility])      Patient Instructions  Routine EEG, if normal will obtain a 3-days ambulatory EEG Continue your other medications Continue to follow with PCP return and sooner if worse Please contact me if you do have another event.  Orders Placed This Encounter  Procedures   EEG adult    No orders of the defined types were placed in this encounter.   No follow-ups on file.    Windell Norfolk, MD 01/09/2024, 7:25 PM  [name] [address] [place], [place] [address] [phone]

## 2024-01-09 NOTE — Patient Instructions (Signed)
Routine EEG, if normal will obtain a 3-days ambulatory EEG Continue your other medications Continue to follow with PCP return and sooner if worse Please contact me if you do have another event.

## 2024-01-11 ENCOUNTER — Ambulatory Visit: Payer: Medicare HMO | Admitting: *Deleted

## 2024-01-11 DIAGNOSIS — R569 Unspecified convulsions: Secondary | ICD-10-CM | POA: Diagnosis not present

## 2024-01-11 NOTE — Procedures (Signed)
    History:  63 year old man with paroxysmal events concerning for seizure  EEG classification: Awake and drowsy  Duration: 26 minutes   Technical aspects: This EEG study was done with scalp electrodes positioned according to the 10-20 International system of electrode placement. Electrical activity was reviewed with band pass filter of 1-70Hz , sensitivity of 7 uV/mm, display speed of 32mm/sec with a 60Hz  notched filter applied as appropriate. EEG data were recorded continuously and digitally stored.   Description of the recording: The background rhythms of this recording consists of a fairly well modulated medium amplitude alpha rhythm of 9 Hz that is reactive to eye opening and closure. Present in the anterior head region is a 15-20 Hz beta activity. Photic stimulation was performed, did not show any abnormalities. Hyperventilation was also  performed, did not show any abnormalities. Drowsiness was manifested by background fragmentation. No abnormal epileptiform discharges seen during this recording.  There were no electrographic seizure identified. There was presence of right posterior occipital quadrant continuous slowing, irregular frequency, high amplitude, sharp and spiky waveforms consistent with breach rhythm.   Abnormality: None   Impression: This is essentially a normal awake and drowsy EEG. No evidence of interictal epileptiform discharges. There is presence of breach rhythm in the right posterior occipital quadrant as described above. Normal EEGs, however, do not rule out epilepsy.    Windell Norfolk, MD [name]

## 2024-01-26 ENCOUNTER — Encounter: Payer: Self-pay | Admitting: Neurology

## 2024-02-19 ENCOUNTER — Ambulatory Visit (INDEPENDENT_AMBULATORY_CARE_PROVIDER_SITE_OTHER): Payer: Medicare HMO | Admitting: Family Medicine

## 2024-02-19 ENCOUNTER — Encounter: Payer: Self-pay | Admitting: Family Medicine

## 2024-02-19 VITALS — BP 112/68 | HR 67 | Temp 98.0°F | Wt 111.0 lb

## 2024-02-19 DIAGNOSIS — Z23 Encounter for immunization: Secondary | ICD-10-CM | POA: Diagnosis not present

## 2024-02-19 DIAGNOSIS — F4321 Adjustment disorder with depressed mood: Secondary | ICD-10-CM

## 2024-02-19 DIAGNOSIS — I700 Atherosclerosis of aorta: Secondary | ICD-10-CM

## 2024-02-19 DIAGNOSIS — E2740 Unspecified adrenocortical insufficiency: Secondary | ICD-10-CM

## 2024-02-19 DIAGNOSIS — Z9289 Personal history of other medical treatment: Secondary | ICD-10-CM | POA: Diagnosis not present

## 2024-02-19 DIAGNOSIS — E063 Autoimmune thyroiditis: Secondary | ICD-10-CM | POA: Diagnosis not present

## 2024-02-19 DIAGNOSIS — C7931 Secondary malignant neoplasm of brain: Secondary | ICD-10-CM

## 2024-02-19 DIAGNOSIS — C439 Malignant melanoma of skin, unspecified: Secondary | ICD-10-CM

## 2024-02-19 DIAGNOSIS — E782 Mixed hyperlipidemia: Secondary | ICD-10-CM | POA: Diagnosis not present

## 2024-02-19 DIAGNOSIS — M810 Age-related osteoporosis without current pathological fracture: Secondary | ICD-10-CM | POA: Diagnosis not present

## 2024-02-19 DIAGNOSIS — C7800 Secondary malignant neoplasm of unspecified lung: Secondary | ICD-10-CM

## 2024-02-19 DIAGNOSIS — F419 Anxiety disorder, unspecified: Secondary | ICD-10-CM | POA: Diagnosis not present

## 2024-02-19 MED ORDER — ESCITALOPRAM OXALATE 10 MG PO TABS: 10.0000 mg | ORAL_TABLET | Freq: Every day | ORAL | 1 refills | Status: DC

## 2024-02-19 NOTE — Patient Instructions (Addendum)
 Return in about 20 weeks (around 07/08/2024) for cpe (20 min), Routine chronic condition follow-up.        Great to see you today.  I have refilled the medication(s) we provide.   If labs were collected or images ordered, we will inform you of  results once we have received them and reviewed. We will contact you either by echart message, or telephone call.  Please give ample time to the testing facility, and our office to run,  receive and review results. Please do not call inquiring of results, even if you can see them in your chart. We will contact you as soon as we are able. If it has been over 1 week since the test was completed, and you have not yet heard from [place], then please call us.    - echart message- for normal results that have been seen by the patient already.   - telephone call: abnormal results or if patient has not viewed results in their echart.  If a referral to a specialist was entered for you, please call us in 2 weeks if you have not heard from the specialist office to schedule.

## 2024-02-19 NOTE — Progress Notes (Signed)
 Patient ID: [name] [name], female  DOB: Mar 18, 1961, 63 y.o.   MRN: [id number] Patient Care Team    Relationship Specialty Notifications Start End  Natalia Leatherwood, DO PCP - General Family Medicine  12/04/23   Marijo Conception, MD Referring Physician Radiation Oncology  12/04/23   Jake Bathe, MD Consulting Physician Cardiology  12/04/23   Philippa Chester, MD Referring Physician Endocrinology  12/04/23   Annalee Genta, MD Referring Physician Internal Medicine  12/04/23    Comment: oncologist for Melanoma  [name]    12/04/23     Chief Complaint  Patient presents with   Anxiety    Subjective:  [name] [name] is a 63 y.o.  female present for Chronic Conditions/illness Management  All past medical history, surgical history, allergies, family history, immunizations, medications and social history were updated in the electronic medical record today. All recent labs, ED visits and hospitalizations within the last year were reviewed. No records were made available prior to patient's visit.   Malignant melanoma metastatic to brain [facility]) (Primary)/melanoma metastatic to lung Patient is established with oncologist for management.  Grief/anxiety Patient reports she is currently under a lot of stress and is grieving the loss of her husband.  Her husband passed away unexpectedly 11-04-2024after being diagnosed with cancer.  She states after diagnosis and only lived for a few weeks.  She and her husband have been together since they were teenagers.  She is feeling lonely.  She has never been on medications to help with anxiety or depression in the past.  She states she is always been like the person that is high strung.  She is having difficulty adjusting to a life on her own, but states she is going to continue to try to adapt.  Chronic daily headache Patient reports she had chronic headaches for many years.  These headaches are even prior to her  melanoma metastasis to her brain.  She has been under the care with a headache specialist during this time as prescribed gabapentin 900 mg nightly, along with a breakthrough gabapentin 100 mg daily.  She would like these medications to be managed by this provider today.  She states this regimen is working well for her headaches.  Adrenal insufficiency (HCC)/hypothyroidism Patient reports she follows with endocrinology who prescribes her levothyroxine for her hypothyroidism and the hydrocortisone for her adrenal insufficiency.  Absence seizure (HCC) This seems to be potentially new diagnosis for patient.  She has an establishment visit already put in place with [facility] on 01/09/2024 to address concerns.  No prior records available.  Mixed hyperlipidemia/aortic atherosclerosis Patient is established with cardiology.  Cardiology prescribes patient's Crestor 5 mg daily.  Patient takes a baby aspirin daily.       02/19/2024    7:54 AM 12/04/2023   11:23 AM 10/27/2016   10:36 AM 07/11/2016    3:26 PM 03/29/2016    2:35 PM  Depression screen PHQ 2/9  Decreased Interest 1 0 0 1 0  Down, Depressed, Hopeless 0 0 0  0  PHQ - 2 Score 1 0 0 1 0  Tired, decreased energy 1      Change in appetite 1      Trouble concentrating 0      Moving slowly or fidgety/restless 1      Suicidal thoughts 0      Difficult doing work/chores Not difficult at all  02/19/2024    7:55 AM  GAD 7 : Generalized Anxiety Score  Nervous, Anxious, on Edge 1  Control/stop worrying 1  Worry too much - different things 0  Trouble relaxing 0  Restless 1  Easily annoyed or irritable 1  Afraid - awful might happen 1  Total GAD 7 Score 5  Anxiety Difficulty Not difficult at all             02/19/2024    7:54 AM 12/04/2023   11:23 AM 08/06/2018   10:24 AM 08/04/2017   11:30 AM 10/27/2016   10:36 AM  Fall Risk   Falls in the past year? 1 0 No No No  Number falls in past yr: 0 0     Injury  with Fall? 0 0     Risk for fall due to :  No Fall Risks     Follow up Falls evaluation completed Falls evaluation completed        Immunization History  Administered Date(s) Administered   Influenza, Seasonal, Injecte, Preservative Fre 09/01/2014, 12/04/2023   Influenza,inj,Quad PF,6+ Mos 12/06/2013, 08/01/2018, 07/31/2019   Influenza-Unspecified 08/29/2016, 08/10/2020, 08/17/2022   PFIZER Comirnaty(Gray Top)Covid-19 Tri-Sucrose Vaccine 02/14/2020, 03/06/2020, 07/19/2021, 09/27/2021   PFIZER(Purple Top)SARS-COV-2 Vaccination 09/03/2020, 02/01/2021   PNEUMOCOCCAL CONJUGATE-20 02/19/2024   Pfizer(Comirnaty)Fall Seasonal Vaccine 12 years and older 04/12/2022, 08/17/2022   Pneumococcal Polysaccharide-23 11/16/2013   Tdap 06/03/2015    No results found.  Past Medical History:  Diagnosis Date   Abnormal electrocardiogram 07/30/2014   Absence seizure (HCC) 12/04/2023   Acute encephalopathy 03/21/2015   Anxiety    Brain cancer (HCC)    Candidiasis of mouth 12/16/2013   Colitis 03/17/2014   GERD (gastroesophageal reflux disease)    Hyperacusia    Hypothyroidism    Left homonymous hemianopsia    secondary to right posterior temporal and occipital lesions.   Lung cancer (HCC)    Lung nodule 11/16/2013   Metastatic melanoma- wedge resection 03/05/2014     Melanoma (HCC)    With metastasis to lung and brain   Nasal valve collapse 06/23/2021   Other headache syndrome    Pyelonephritis 12/2020   Syncope and collapse 03/31/2015   TMJ (dislocation of temporomandibular joint)    Allergies  Allergen Reactions   Morphine And Codeine Nausea Only   Scopolamine Rash   Past Surgical History:  Procedure Laterality Date   APPENDECTOMY  1979   LUMBAR WOUND DEBRIDEMENT N/A 12/05/2013   Procedure: irrigation and debridement posterior cervical wound;  Surgeon: Tia Alert, MD;  Location: [facility];  Service: Neurosurgery;  Laterality: N/A;   SUBOCCIPITAL CRANIECTOMY CERVICAL LAMINECTOMY  N/A 11/19/2013   Procedure: SUBOCCIPITAL CRANIECTOMY for tumor;  Surgeon: Tia Alert, MD;  Location: [facility];  Service: Neurosurgery;  Laterality: N/A;   TONSILLECTOMY     VIDEO ASSISTED THORACOSCOPY (VATS)/WEDGE RESECTION Left 03/05/2014   Procedure: VIDEO ASSISTED THORACOSCOPY (VATS)/WEDGE RESECTION;  Surgeon: Loreli Slot, MD;  Location: [facility] OR;  Service: Thoracic;  Laterality: Left;   Family History  Problem Relation Age of Onset   Alzheimer's disease Mother    Social History   Social History Narrative   Marital status/children/pets: Widowed.  No children.   Education/employment: 2 years of college, retired/disabled   Right handed       Allergies as of 02/19/2024       Reactions   Morphine And Codeine Nausea Only   Scopolamine Rash        Medication List  Accurate as of February 19, 2024  8:15 AM. If you have any questions, ask your nurse or doctor.          alendronate 70 MG tablet Commonly known as: FOSAMAX Take 70 mg by mouth once a week.   Alkindi Sprinkle 5 MG Cpsp Generic drug: Hydrocortisone   aspirin EC 81 MG tablet Take 1 tablet (81 mg total) by mouth daily. Swallow whole.   Comirnaty syringe Generic drug: COVID-19 mRNA vaccine (Pfizer)   D 1000 25 MCG (1000 UT) capsule Generic drug: Cholecalciferol Take 2,000 Units by mouth daily.   escitalopram 10 MG tablet Commonly known as: Lexapro Take 1 tablet (10 mg total) by mouth daily. What changed:  medication strength how much to take Changed by: Felix Pacini   gabapentin 600 MG tablet Commonly known as: NEURONTIN Take 1.5 tablets (900 mg total) by mouth at bedtime.   gabapentin 100 MG capsule Commonly known as: NEURONTIN Take 1 capsule (100 mg total) by mouth daily as needed.   levothyroxine 75 MCG tablet Commonly known as: SYNTHROID Take 75 mcg by mouth daily before breakfast.   Multi-Vitamin tablet Take 1 tablet by mouth daily.   potassium chloride 10 MEQ  tablet Commonly known as: KLOR-CON Take 20 mEq by mouth daily.   rosuvastatin 5 MG tablet Commonly known as: CRESTOR Take 1 tablet (5 mg total) by mouth daily.   Super Calcium 1500 (600 Ca) MG Tabs tablet Generic drug: calcium carbonate 1 tablet with meals Orally Twice a day        All past medical history, surgical history, allergies, family history, immunizations andmedications were updated in the EMR today and reviewed under the history and medication portions of their EMR.    No results found for this or any previous visit (from the past 2160 hours).  CT CORONARY MORPH W/CTA COR W/SCORE W/CA W/CM &/OR WO/CM Result Date: 10/24/2022 IMPRESSION:  1. Minimal mixed non-obstructive CAD, CADRADS = 1.  2. Coronary calcium score of 98. This was 88th percentile for age and sex matched control.  3. Normal coronary origin with right dominance.     ROS 14 pt review of systems performed and negative (unless mentioned in an HPI)  Objective: BP 112/68   Pulse 67   Temp 98 F (36.7 C)   Wt 111 lb (50.3 kg)   SpO2 95%   BMI 17.39 kg/m  Physical Exam Vitals and nursing note reviewed.  Constitutional:      General: She is not in acute distress.    Appearance: Normal appearance. She is not ill-appearing, toxic-appearing or diaphoretic.  HENT:     Head: Normocephalic and atraumatic.  Eyes:     General: No scleral icterus.       Right eye: No discharge.        Left eye: No discharge.     Extraocular Movements: Extraocular movements intact.     Conjunctiva/sclera: Conjunctivae normal.     Pupils: Pupils are equal, round, and reactive to light.  Cardiovascular:     Rate and Rhythm: Normal rate and regular rhythm.  Pulmonary:     Effort: Pulmonary effort is normal. No respiratory distress.     Breath sounds: Normal breath sounds. No wheezing, rhonchi or rales.  Musculoskeletal:     Right lower leg: No edema.     Left lower leg: No edema.  Skin:    General: Skin is warm.      Findings: No rash.  Neurological:  Mental Status: She is alert and oriented to person, place, and time. Mental status is at baseline.     Motor: No weakness.     Gait: Gait normal.  Psychiatric:        Mood and Affect: Mood normal.        Behavior: Behavior normal.        Thought Content: Thought content normal.        Judgment: Judgment normal.      Assessment/plan: [name] [name] is a 63 y.o. female present for Chronic Conditions/illness Management Malignant melanoma metastatic to brain [facility]) (Primary)/melanoma metastatic to lung Patient is established with oncologist with regular routine visits and imaging.  Grief/anxiety Patient recently lost her husband in October 2024 to cancer.  His passing was sudden after not expected diagnosis of cancer. Recommended grief sharing at her church.  She declined therapy referral today.  Increase Lexapro 5> 10 mg daily> doing better -could use more   Chronic daily headache Patient has been established with headache specialist for many years. Would like this provider to take over gabapentin, agreed to do so for her. Continue gabapentin 900 mg nightly, gabapentin 100 mg daily  Adrenal insufficiency (HCC)/hypothyroidism Conditions are managed by endocrine ([facility]) > would like local referral so she no longer has to travel> placed. Patient prescribed chronic hydrocortisone by endocrine Patient prescribed levothyroxine 75 mcg by endocrine.  Absence seizure [facility]) Patient has neurology established with [facility] for evaluation.  Mixed hyperlipidemia/aortic atherosclerosis Patient established with cardiology-Dr. Anne Fu Continue Crestor 5 mg daily-cardiology provides  Osteoporosis/vitamin D deficiency: Patient is prescribed Fosamax 70 mg weekly. She supplements with vitamin D and calcium. Managed by her endocrine team DEXA completed 05/30/2022 (-3.0)-managed by endocrine  Health maintenance: Pneumonia vaccine  completed today  Return in about 20 weeks (around 07/08/2024) for cpe (20 min), Routine chronic condition follow-up.  Orders Placed This Encounter  Procedures   Pneumococcal conjugate vaccine 20-valent   Ambulatory referral to Endocrinology   Meds ordered this encounter  Medications   escitalopram (LEXAPRO) 10 MG tablet    Sig: Take 1 tablet (10 mg total) by mouth daily.    Dispense:  90 tablet    Refill:  1   Referral Orders         Ambulatory referral to Endocrinology       Note is dictated utilizing voice recognition software. Although note has been proof read prior to signing, occasional typographical errors still can be missed. If any questions arise, please do not hesitate to call for verification.  Electronically signed by: Felix Pacini, DO [facility] [place]

## 2024-02-20 DIAGNOSIS — E559 Vitamin D deficiency, unspecified: Secondary | ICD-10-CM | POA: Diagnosis not present

## 2024-02-20 DIAGNOSIS — E063 Autoimmune thyroiditis: Secondary | ICD-10-CM | POA: Diagnosis not present

## 2024-02-20 DIAGNOSIS — M810 Age-related osteoporosis without current pathological fracture: Secondary | ICD-10-CM | POA: Diagnosis not present

## 2024-02-20 DIAGNOSIS — E2740 Unspecified adrenocortical insufficiency: Secondary | ICD-10-CM | POA: Diagnosis not present

## 2024-04-01 ENCOUNTER — Telehealth: Payer: Self-pay

## 2024-04-01 ENCOUNTER — Ambulatory Visit (INDEPENDENT_AMBULATORY_CARE_PROVIDER_SITE_OTHER): Admitting: Licensed Clinical Social Worker

## 2024-04-01 DIAGNOSIS — E2740 Unspecified adrenocortical insufficiency: Secondary | ICD-10-CM

## 2024-04-01 DIAGNOSIS — F419 Anxiety disorder, unspecified: Secondary | ICD-10-CM | POA: Diagnosis not present

## 2024-04-01 DIAGNOSIS — F4321 Adjustment disorder with depressed mood: Secondary | ICD-10-CM

## 2024-04-01 DIAGNOSIS — F32A Depression, unspecified: Secondary | ICD-10-CM

## 2024-04-01 DIAGNOSIS — E063 Autoimmune thyroiditis: Secondary | ICD-10-CM

## 2024-04-01 NOTE — Telephone Encounter (Signed)
 Please look into endocrinology referral placed in March for this patient for adrenal insufficiency.  Please mark this referral no urgent since they have not contacted her to make an appointment as of yet she is now in more urgent need of appointment for management of her adrenal insufficiency and seem to be her hydrocortisone 

## 2024-04-01 NOTE — Progress Notes (Signed)
 Comprehensive Clinical Assessment (CCA) Note  04/01/2024 [name] [name] [id number]  Chief Complaint:  Chief Complaint  Patient presents with   Grief   Anxiety   Depression   Visit Diagnosis: Anxiety and depression, grief  CCA Biopsychosocial Intake/Chief Complaint:  Patient says 10-year brain cancer survivor therapist asked why here she lost [name] and 25 days after he was diagnosed with cancer married 40 years she knew him when they first met 6 weeks and got married. Died last 09-29-2024.  Therapist noted could not keep up with her patient says she has been hyper all her life when she first saw Dr. After loss thing only depressed doctor can see is better because more talkative patient says a talker and very lonely.  Current Symptoms/Problems: Grief, anxiety, depression   Patient Reported Schizophrenia/Schizoaffective Diagnosis in Past: No   Strengths: does like to make people smile, can bake pretty well, good dog mommy-has Buddy  Preferences: therapy, med management  Abilities: bake preety well, read, good dog mom   Type of Services Patient Feels are Needed: therapy, med management   Initial Clinical Notes/Concerns: Patient on gabapentin  that so she does not wake up with a headache every morning once you have head split open gets headaches.  Because of immunotherapy adrenal gland stopped working patient diagnosed with adrenal insufficiency hypothyroidism. On Lexapro . 7 months patient said time has helped. Patient said what supposed to not curl in bed and cry.  Only mental health treatment patient has had his medications from her primary care doctor. Medical-adrenal gland  BP issues made her pass, lost left peripheral vision from passing out. Cancer in head and lung taken out.  Her husband wants her everything her dad her kid her husband, her best friend.  Patient also has heart problem. Family history-none patient added afterward medical issues hyperacusis   Mental Health  Symptoms Depression:  Change in energy/activity; Difficulty Concentrating; Fatigue; Increase/decrease in appetite; Tearfulness   Duration of Depressive symptoms: Greater than two weeks   Mania:  None (bit fingernails.)   Anxiety:   Worrying; Difficulty concentrating; Fatigue; Restlessness; Tension (Takes a sleeping pill, bites nails. Different that used to be as a brain cancer survivor. has short-term memory issues.)   Psychosis:  None   Duration of Psychotic symptoms: n/a  Trauma:  None   Obsessions:  None   Compulsions:  None   Inattention:  None   Hyperactivity/Impulsivity:  None   Oppositional/Defiant Behaviors:  None   Emotional Irregularity:  None   Other Mood/Personality Symptoms:  No data recorded   Mental Status Exam Appearance and self-care  Stature:  Average   Weight:  Underweight   Clothing:  Casual   Grooming:  Normal   Cosmetic use:  None   Posture/gait:  Normal   Motor activity:  No data recorded  Sensorium  Attention:  Normal   Concentration:  Normal   Orientation:  X5   Recall/memory:  Defective in Short-term   Affect and Mood  Affect:  -- (euthymic and hyper)   Mood:  Anxious; Depressed   Relating  Eye contact:  Normal   Facial expression:  Responsive   Attitude toward examiner:  Cooperative   Thought and Language  Speech flow: Normal   Thought content:  Appropriate to Mood and Circumstances   Preoccupation:  None   Hallucinations:  None   Organization:  No data recorded  [employer] of Knowledge:  Average   Intelligence:  Average   Abstraction:  Normal  Judgement:  Fair   Dance movement psychotherapist:  Realistic   Insight:  Fair   Decision Making:  Normal (feedback from friends.)   Social Functioning  Social Maturity:  Isolates; Responsible (feels only feels both connecting and also isolating. Lives with Noah Baston)   Social Judgement:  Normal   Stress  Stressors:  Grief/losses (doesn't like storms and  winds. Can be lonely living alone never done before.)   Coping Ability:  Overwhelmed; Exhausted   Skill Deficits:  -- (memory has white board around the house, social connection, grief)   Supports:  Friends/Service system; Family (friends and sister in [place]  calls once a day brother who drinks and no help)     Religion: Religion/Spirituality Are You A Religious Person?: Yes What is Your Religious Affiliation?: Methodist How Might This Affect Treatment?: n/a  Leisure/Recreation: Leisure / Recreation Do You Have Hobbies?: Yes Leisure and Hobbies: see above  Exercise/Diet: Exercise/Diet Do You Exercise?: Yes What Type of Exercise Do You Do?: Run/Walk How Many Times a Week Do You Exercise?: 6-7 times a week (several times a day) Have You Gained or Lost A Significant Amount of Weight in the Past Six Months?: No Do You Follow a Special Diet?: No Do You Have Any Trouble Sleeping?: No   CCA Employment/Education Employment/Work Situation: Employment / Work Situation Employment Situation: On disability Why is Patient on Disability: brain cancer How Long has Patient Been on Disability: March 2015, Surgery 2014 worked for a little while but couldn't Patient's Job has Been Impacted by Current Illness:  (n/a) What is the Longest Time Patient has Held a Job?: [employer]-claims insured [employer] Where was the Patient Employed at that Time?: see above Has Patient ever Been in the 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 Did You Attend Graduate School?: No What Was Your Major?: n/a Did You Have Any Special Interests In School?: English and Math Did You Have An Individualized Education Program  (IIEP): No Did You Have Any Difficulty At School?: No Patient's Education Has Been Impacted by Current Illness: No   CCA Family/Childhood History Family and Relationship History: Family history Marital status: Widowed Widowed, when?: August 30, 2023 Are you sexually active?: No What is your sexual orientation?: Heterosexual Has your sexual activity been affected by drugs, alcohol, medication, or emotional stress?: n/a Does patient have children?: No  Childhood History:  Childhood History By whom was/is the patient raised?: Both parents Additional childhood history information: great childhood Description of patient's relationship with caregiver when they were a child: really good Patient's description of current relationship with people who raised him/her: passed. How were you disciplined when you got in trouble as a child/adolescent?: didn't want to disappoint parents. Does patient have siblings?: Yes Number of Siblings: 2 Description of patient's current relationship with siblings: patient is the middle brother older and sister younger. Relationship see above.  sister calls every day brother drinks patient says she tells people she loves them but does not like him too much Did patient suffer any verbal/emotional/physical/sexual abuse as a child?: No Did patient suffer from severe childhood neglect?: No Has patient ever been sexually abused/assaulted/raped as an adolescent or adult?: No Was the patient ever a victim of a crime or a disaster?:  No Witnessed domestic violence?: No Has patient been affected by domestic violence as an adult?: No  Child/Adolescent Assessment: n/a     CCA Substance Use Alcohol/Drug Use: Alcohol / Drug Use Pain Medications: See MAR Prescriptions: see MAR Over the Counter: see MAR History of alcohol / drug use?: No history of alcohol / drug abuse (every once in awhile smokes pot helps with nausea)                         ASAM's:  Six  Dimensions of Multidimensional Assessment  Dimension 1:  Acute Intoxication and/or Withdrawal Potential:      Dimension 2:  Biomedical Conditions and Complications:      Dimension 3:  Emotional, Behavioral, or Cognitive Conditions and Complications:     Dimension 4:  Readiness to Change:     Dimension 5:  Relapse, Continued use, or Continued Problem Potential:     Dimension 6:  Recovery/Living Environment:     ASAM Severity Score:    ASAM Recommended Level of Treatment:     Substance use Disorder (SUD)-n/a    Recommendations for Services/Supports/Treatments: Recommendations for Services/Supports/Treatments Recommendations For Services/Supports/Treatments: Individual Therapy, Medication Management  DSM5 Diagnoses: Patient Active Problem List   Diagnosis Date Noted   Osteoporosis 12/20/2023   Grief 12/07/2023   Malignant melanoma metastatic to brain (HCC) 12/04/2023   Hardening of the aorta (main artery of the heart) (HCC) 12/04/2023   Mixed hyperlipidemia 12/04/2023   Adrenal insufficiency (HCC) 08/10/2015   Left homonymous hemianopsia 06/09/2015   Chronic daily headache 06/09/2015   History of immunotherapy 04/07/2015   Anxiety 03/21/2015   Metastatic melanoma to lung (HCC) 03/25/2014   Melanoma of skin (HCC) 11/20/2013   S/P craniotomy 11/19/2013   Hypothyroid 11/14/2013    Patient Centered Plan: Patient is on the following Treatment Plan(s):  Anxiety and Depression, Grief, patient reports lonely-complete last 2 questionnaires and treatment plan next treatment session   Referrals to Alternative Service(s): Referred to Alternative Service(s):   Place:   Date:   Time:    Referred to Alternative Service(s):   Place:   Date:   Time:    Referred to Alternative Service(s):   Place:   Date:   Time:    Referred to Alternative Service(s):   Place:   Date:   Time:      Collaboration of Care: Other review of recent note by primary care doctor  Patient/Guardian was advised  Release of Information must be obtained prior to any record release in order to collaborate their care with an outside provider. Patient/Guardian was advised if they have not already done so to contact the registration department to sign all necessary forms in order for us  to release information regarding their care.   Consent: Patient/Guardian gives verbal consent for treatment and assignment of benefits for services provided during this visit. Patient/Guardian expressed understanding and agreed to proceed.   Dallie Duel, LCSW

## 2024-04-01 NOTE — Telephone Encounter (Signed)
 Copied from CRM [id number]. Topic: Clinical - Medication Question >> Apr 01, 2024 11:00 AM [name] [name] wrote: Reason for CRM: Patient would like to let Dr. Marylee Snowball she has yet to see a new endocrinologist (no one has contacted her) and desperately needs Hydrocortisone  (ALKINDI  SPRINKLE) 5 MG CPSP to be refilled. Please call [phone] with an update.

## 2024-04-02 NOTE — Telephone Encounter (Signed)
 LVM for clinical nurse at [facility]. Referral was placed for pt 3/24; I do not see any documentation that pt was called for scheduling.

## 2024-04-03 MED ORDER — ALKINDI SPRINKLE 5 MG PO CPSP: 5.0000 mg | ORAL_CAPSULE | Freq: Every day | ORAL | 1 refills | Status: DC

## 2024-04-03 NOTE — Telephone Encounter (Signed)
 Pt aware and verbalized understanding.

## 2024-04-03 NOTE — Telephone Encounter (Signed)
 Spoke with [facility]. Pt is scheduled for 07/08/24. Pt cannot be seen any sooner due to provider unavailability.

## 2024-04-03 NOTE — Telephone Encounter (Signed)
 Please inform patient of appointment at [facility] Endo 07/08/2024. I have put in a second referral to the [facility] system endocrinology.  If [name] [name] is able to get into them quicker I would recommend [name] [name] take the sooner appointment (and then [name] [name] would cancel the other Endo).  I did go ahead and call in a refill of the hydrocortisone  5 mg sprinkles that was on her med list, so that [name] [name] does not run out of medicine.  When [name] [name] does establish with endocrinology make sure they to take over the medication refill on that prescription.

## 2024-04-03 NOTE — Addendum Note (Signed)
 Addended by: Napolean Backbone A on: 04/03/2024 03:53 PM   Modules accepted: Orders

## 2024-04-03 NOTE — Telephone Encounter (Signed)
 Still unable to reach staff from [facility].

## 2024-04-04 ENCOUNTER — Telehealth: Payer: Self-pay

## 2024-04-04 MED ORDER — ALKINDI SPRINKLE 5 MG PO CPSP: 5.0000 mg | ORAL_CAPSULE | Freq: Every day | ORAL | 1 refills | Status: DC

## 2024-04-04 NOTE — Telephone Encounter (Signed)
Called pharmacy, prescription canceled.

## 2024-04-04 NOTE — Addendum Note (Signed)
 Addended by: Terris Fickle D on: 04/04/2024 11:56 AM   Modules accepted: Orders

## 2024-04-04 NOTE — Telephone Encounter (Signed)
 [facility] is unable to dispense Alkindi  sprinkle. New rx pending with pharmacy that is able to complete order  Please fill, if appropriate.

## 2024-04-04 NOTE — Telephone Encounter (Signed)
 Please contact patient We are not going to be able to prescribe her hydrocortisone  for her.  We are receiving many questions from the pharmacies that we have attempted to call this medication in for asking about the diagnoses, when it was started and if it was approved for the off label use.  These are not questions I will be able to answer for the pharmacies, therefore I cannot refill the medicine for her.   Please contact pharmacy that is calling and tell them to cancel prescription.

## 2024-04-04 NOTE — Telephone Encounter (Signed)
 Communication  Reason for CRM: [facility] called in to receive clarification on prescription Hydrocortisone  (ALKINDI  SPRINKLE) 5 MG CPSP diagnoses code and verifying if it's being used off labeled since it's approved use in pediatric patients, depending on dx code will patient need more medication for stress. Call back [phone]   Please advise.

## 2024-04-05 NOTE — Telephone Encounter (Signed)
 Communication  Reason for CRM: Trevor Fudge from [facility] called stating that she will need the code number associated with the patient's diagnosis for Hydrocortisone  (ALKINDI  SPRINKLE) 5 MG CPSP. Trevor Fudge stated this medication with also need prior authorization. Callback number for Trevor Fudge is [phone], and the phone number for the prior auth department is [phone].   Refer to phone note from 5/8.

## 2024-04-08 ENCOUNTER — Ambulatory Visit (HOSPITAL_COMMUNITY): Admitting: Licensed Clinical Social Worker

## 2024-04-08 ENCOUNTER — Encounter (HOSPITAL_COMMUNITY): Payer: Self-pay

## 2024-04-08 DIAGNOSIS — F4321 Adjustment disorder with depressed mood: Secondary | ICD-10-CM

## 2024-04-08 DIAGNOSIS — F32A Depression, unspecified: Secondary | ICD-10-CM

## 2024-04-08 DIAGNOSIS — F419 Anxiety disorder, unspecified: Secondary | ICD-10-CM | POA: Diagnosis not present

## 2024-04-08 NOTE — Progress Notes (Signed)
   THERAPIST PROGRESS NOTE  Session Time: 11:10 AM to 11:55 AM  Participation Level: Active  Behavioral Response: CasualAlertpatient talkative hyper in session but mood in general has grief issues  Type of Therapy: Individual Therapy  Treatment Goals addressed: Addressed grief issues helpful to talk about [name] [name], addressed anxiety issues when triggered, coping  ProgressTowards Goals: Initial-completed treatment plan assess helpful session to work on therapeutic relationship as well as patient initially being able to start talking about her life with husband  Interventions: Solution Focused, Strength-based, Supportive, and Other: Grief  Summary: [name] [name] is a 63 y.o. female who presents with good book and a pet helps. Boring in retirement. Talk about [name] [name] can't fix her bored and lonely. Talk to somebody. Read to [facility] he could read not the best reader when 65 got a [employer] card. He likes books from [facility] . He picked up [facility] and did the different voices. He turned 64 in 06/25/2024 and died 09-10-23. Eloped to [place] . He was a glass man knew how to work with glass his profession beyond charming. Only know each other 6 weeks before married. Liked way treated mom and kitty cat. Talked about her history 20 years went to work at [employer] at the time.  Wants to stay at her home "full of [name]". Sister is started her drive tomorrow from [place] . Great childhood and great life with [name] [name] camped everywhere. Will spend the day with her son, wife and daughter. Going to the beach in August. [name] [name] and [name] [name] are sister's kids grew up with them they were close. Sister is [name] [name].    Helpful session to get to know patient better she has good understanding of therapy noting therapist cannot fix her the same time helpful to have someone to talk to about [name] [name] helpful to talk to when describes being bored and lonely.  As we talk therapist can see many resources patient has likes  gardening likes reading has dog [name] [name].  Has family member and state that helpful as well.  Patient thinks therapy will be helpful as well in addition to talking about [name] [name].  Patient able to share pictures of her spouse share stories both of us  can see had a good life with him had a good upbringing.  Able to complete treatment plan patient gave consent to complete virtually in session.  Helpful to cover different topics different interest, different stories help with mood.  Assess helpful to share interests and see overlap such as books perspectives of world affairs helpful for connection. Suicidal/Homicidal: No  Plan: Return again in 1 week.2.  Complete questionnaires, encourage patient to talk about life with her husband continue explore resources that we will help her with coping  Diagnosis: Grief, anxiety and depression  Collaboration of Care: Other none needed  Patient/Guardian was advised Release of Information must be obtained prior to any record release in order to collaborate their care with an outside provider. Patient/Guardian was advised if they have not already done so to contact the registration department to sign all necessary forms in order for us  to release information regarding their care.   Consent: Patient/Guardian gives verbal consent for treatment and assignment of benefits for services provided during this visit. Patient/Guardian expressed understanding and agreed to proceed.   [name] Duel, LCSW 04/08/2024

## 2024-04-11 ENCOUNTER — Other Ambulatory Visit (HOSPITAL_COMMUNITY): Payer: Self-pay

## 2024-04-11 ENCOUNTER — Telehealth: Payer: Self-pay

## 2024-04-11 DIAGNOSIS — E273 Drug-induced adrenocortical insufficiency: Secondary | ICD-10-CM | POA: Diagnosis not present

## 2024-04-11 DIAGNOSIS — M810 Age-related osteoporosis without current pathological fracture: Secondary | ICD-10-CM | POA: Diagnosis not present

## 2024-04-11 DIAGNOSIS — E063 Autoimmune thyroiditis: Secondary | ICD-10-CM | POA: Diagnosis not present

## 2024-04-11 LAB — LAB REPORT - SCANNED
EGFR: 82
Free T4: 1.19 ng/dL
TSH: 17.5 — AB (ref 0.41–5.90)

## 2024-04-11 NOTE — Telephone Encounter (Signed)
 Pharmacy Patient Advocate Encounter   Received notification from CoverMyMeds that prior authorization for Alkindi  Sprinkle 5MG  sprinkle capsules is required/requested.   Insurance verification completed.   The patient is insured through [facility] .   Per test claim: Refill too soon. PA is not needed at this time. Medication was filled 04/02/2024. Next eligible fill date is 06/16/2024.

## 2024-04-15 ENCOUNTER — Ambulatory Visit (HOSPITAL_COMMUNITY): Admitting: Licensed Clinical Social Worker

## 2024-04-15 DIAGNOSIS — F4321 Adjustment disorder with depressed mood: Secondary | ICD-10-CM

## 2024-04-15 DIAGNOSIS — F32A Depression, unspecified: Secondary | ICD-10-CM

## 2024-04-15 DIAGNOSIS — F419 Anxiety disorder, unspecified: Secondary | ICD-10-CM

## 2024-04-15 NOTE — Progress Notes (Signed)
 THERAPIST PROGRESS NOTE  Session Time: 11:00 AM to 11:50 PM  Participation Level: Active  Behavioral Response: CasualAlertin session affect  euthymic but mood related to grief depressed  Type of Therapy: Individual Therapy  Treatment Goals addressed:  Addressed grief issues helpful to talk about Consuelo Denmark, addressed anxiety issues when triggered, coping  ProgressTowards Goals: Progressing-patient sharing about memories +ones as well as traumatic ones helping her work on grief issues  Interventions: Solution Focused, Strength-based, Supportive, and Other: grief  Summary: [name] [name] is a 63 y.o. female who presents with talking about visiting sister, her son and family and plans on doing more she is here for two weeks.  She has been sitting at son's place but plans to come visit patient for couple days.  Therapist noted happy to hear that patient having activities with family positive impact on her particularly when coming to therapy working on grief.  Patient goes on to share about her relationship Hinton Luis could help her look at her and know she was feeling funky.  A friend told her to help her wouldn't want [name] to go what going through.  Patient had cancer thought would go first.  Noted telling friend she is going to grief counselor doing that for them as well they are happy she is doing that.   Patient opened up about past trauma. Talked about Hinton Luis being in accident 2005 was a tragic accident young person was killed.  He ended up in the hospital was not sure if he would make it then was in prison for 5 months. Patient aware of life though shared the expression that therapist liked and wanted to put in her note "grab pops of joy".  Noted therapist wants her to experience meaning in life going forward patient says how can not around [place] and Trenia Fritter is pregnant, Glee Lana is her daughter's son.  Patient said an easier way to remember may be to call them "[place]". Patient opened up about other  tragedy sister drove boat that killed Dad, patient got cancer he was her rock, bed rock.  Sharing this with to help therapist see how deep her loss has been.  Therapist noted any type of spirituality knowing they are still there and patient says she feels that way helping guide her things as far as just making sure to turn her head due to her visual issues when she needs to can be at that basic level.   We also in session shared pictures helpful for grieving process patient sharing pictures [name] therapist noting L hand some he was.  He also showing pictures of sister niece ways to see positive people in her life important people.  Patient sharing about her loss helping us  work on grief issues patient sharing about past tragedy helpful for processing helpful for helping her with grief issues.  Therapist provided support and space for patient to talk about thoughts and feelings helping her process.  Suicidal/Homicidal: No  Plan: Return again in 3 weeks.2.  Work on grief issues, work on coping  Diagnosis: Grief, anxiety and depression  Collaboration of Care: Other none needed  Patient/Guardian was advised Release of Information must be obtained prior to any record release in order to collaborate their care with an outside provider. Patient/Guardian was advised if they have not already done so to contact the registration department to sign all necessary forms in order for us  to release information regarding their care.   Consent: Patient/Guardian gives verbal consent for treatment and assignment  of benefits for services provided during this visit. Patient/Guardian expressed understanding and agreed to proceed.   Dallie Duel, LCSW 04/15/2024

## 2024-05-06 ENCOUNTER — Ambulatory Visit (HOSPITAL_COMMUNITY): Admitting: Licensed Clinical Social Worker

## 2024-05-06 DIAGNOSIS — F419 Anxiety disorder, unspecified: Secondary | ICD-10-CM

## 2024-05-06 DIAGNOSIS — F4321 Adjustment disorder with depressed mood: Secondary | ICD-10-CM

## 2024-05-06 DIAGNOSIS — F32A Depression, unspecified: Secondary | ICD-10-CM

## 2024-05-06 NOTE — Progress Notes (Signed)
   THERAPIST PROGRESS NOTE  Session Time: 2:00 PM to 2:48 PM  Participation Level: Active  Behavioral Response: CasualAlertappropriate in session but still dealing with grief issues  Type of Therapy: Individual Therapy  Treatment Goals addressed: Addressed grief issues helpful to talk about [name], addressed anxiety issues when triggered, coping  ProgressTowards Goals: Progressing-processed thoughts and feelings helpful for patient to find connection after losing her husband per report of sister doing better anxiety decreased  Interventions: Solution Focused, Strength-based, Supportive, and Other: Coping  Summary: [name] [name] is a 63 y.o. female who presents with lost somebody and is therapist talks about working with her to find purpose patient gives therapist context feels that she lost herself and explains why married 21 years 2/3 life with [place]. [place] Kerbs had that a lot of people don't have that.  Therapist and patient both now that can make it hard to wake up and nobody went to 5 PM before sister called. Don't want people to feel responsible why limits going out explains why a little spacey a part of brain gone don't see right memory sucks. Hyperacusis hear too well loud noise bother her especially certain tones. Doesn't go to church the sounds in church bother her.  Why has earplugs.  Talked about difficulty of losing people in this life but positive patient has had some experiences of connection that make her believe in something other than's and that is helpful.   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 Sister's son and his wife, Trenia Fritter, are pregnant again just found out pregnant. Call them "S[place]"  Therapist asked patient to remind her of some she said last session that really likes"Pops of Joy".  Sister's daughter Pattie Borders married an Architect showed therapist a picture and therapist impressed. .   Reviewed recent events since last  saw patient patient shares what she appreciates with our sessions is having someone to talk to.  Hard to be with someone for 21 years two thirds of her life as she gives therapist perspective one of the values of coming for therapy to engage to talk about what is going on her life with somebody who is supportive.  Talked about various issues positive things like her sister and her family, also getting to know patient better some of her issues why not getting out as much.  Therapist continues to guide patient of seeing her life will go on living out her purpose but agree with patient it takes time report sister says better possibly because of medication that they are going to increase patient again believes it is also time. Therapist provided active listening / Reflection utilized as we work on grieving issues and coping in general. Emotional Support Provided         Suicidal/Homicidal: No  Plan: Return again in 1 week.2.Work on grief issues, work on coping  Diagnosis: Grief, anxiety and depression  Collaboration of Care: Other none needed  Patient/Guardian was advised Release of Information must be obtained prior to any record release in order to collaborate their care with an outside provider. Patient/Guardian was advised if they have not already done so to contact the registration department to sign all necessary forms in order for us  to release information regarding their care.   Consent: Patient/Guardian gives verbal consent for treatment and assignment of benefits for services provided during this visit. Patient/Guardian expressed understanding and agreed to proceed.   Dallie Duel, LCSW 05/06/2024

## 2024-05-07 ENCOUNTER — Emergency Department (HOSPITAL_COMMUNITY)

## 2024-05-07 ENCOUNTER — Emergency Department (HOSPITAL_COMMUNITY)
Admission: EM | Admit: 2024-05-07 | Discharge: 2024-05-07 | Disposition: A | Discharge status: Home / Self Care | Attending: Emergency Medicine | Admitting: Emergency Medicine

## 2024-05-07 DIAGNOSIS — D72829 Elevated white blood cell count, unspecified: Secondary | ICD-10-CM | POA: Insufficient documentation

## 2024-05-07 DIAGNOSIS — Z7982 Long term (current) use of aspirin: Secondary | ICD-10-CM | POA: Insufficient documentation

## 2024-05-07 DIAGNOSIS — E1165 Type 2 diabetes mellitus with hyperglycemia: Secondary | ICD-10-CM | POA: Diagnosis not present

## 2024-05-07 DIAGNOSIS — E876 Hypokalemia: Secondary | ICD-10-CM | POA: Diagnosis not present

## 2024-05-07 DIAGNOSIS — N83202 Unspecified ovarian cyst, left side: Secondary | ICD-10-CM | POA: Diagnosis not present

## 2024-05-07 DIAGNOSIS — R112 Nausea with vomiting, unspecified: Secondary | ICD-10-CM | POA: Insufficient documentation

## 2024-05-07 DIAGNOSIS — K5730 Diverticulosis of large intestine without perforation or abscess without bleeding: Secondary | ICD-10-CM | POA: Diagnosis not present

## 2024-05-07 DIAGNOSIS — R1084 Generalized abdominal pain: Secondary | ICD-10-CM | POA: Diagnosis not present

## 2024-05-07 DIAGNOSIS — K5792 Diverticulitis of intestine, part unspecified, without perforation or abscess without bleeding: Secondary | ICD-10-CM | POA: Diagnosis not present

## 2024-05-07 DIAGNOSIS — Z7952 Long term (current) use of systemic steroids: Secondary | ICD-10-CM | POA: Insufficient documentation

## 2024-05-07 DIAGNOSIS — Z85841 Personal history of malignant neoplasm of brain: Secondary | ICD-10-CM | POA: Diagnosis not present

## 2024-05-07 DIAGNOSIS — R110 Nausea: Secondary | ICD-10-CM | POA: Diagnosis not present

## 2024-05-07 DIAGNOSIS — E2740 Unspecified adrenocortical insufficiency: Secondary | ICD-10-CM | POA: Insufficient documentation

## 2024-05-07 DIAGNOSIS — R7989 Other specified abnormal findings of blood chemistry: Secondary | ICD-10-CM

## 2024-05-07 DIAGNOSIS — R739 Hyperglycemia, unspecified: Secondary | ICD-10-CM | POA: Insufficient documentation

## 2024-05-07 DIAGNOSIS — R231 Pallor: Secondary | ICD-10-CM | POA: Diagnosis not present

## 2024-05-07 LAB — URINALYSIS, ROUTINE W REFLEX MICROSCOPIC
Bacteria, UA: NONE SEEN
Bilirubin Urine: NEGATIVE
Glucose, UA: >=500 mg/dL — AB
Hgb urine dipstick: NEGATIVE
Ketones, ur: 20 mg/dL — AB
Leukocytes,Ua: NEGATIVE
Nitrite: NEGATIVE
Protein, ur: NEGATIVE mg/dL
Specific Gravity, Urine: 1.015 (ref 1.005–1.030)
pH: 8 (ref 5.0–8.0)

## 2024-05-07 LAB — COMPREHENSIVE METABOLIC PANEL WITH GFR
ALT: 48 U/L — ABNORMAL HIGH (ref 0–44)
AST: 35 U/L (ref 15–41)
Albumin: 4 g/dL (ref 3.5–5.0)
Alkaline Phosphatase: 108 U/L (ref 38–126)
Anion gap: 14 (ref 5–15)
BUN: 11 mg/dL (ref 8–23)
CO2: 19 mmol/L — ABNORMAL LOW (ref 22–32)
Calcium: 9.5 mg/dL (ref 8.9–10.3)
Chloride: 105 mmol/L (ref 98–111)
Creatinine, Ser: 0.72 mg/dL (ref 0.44–1.00)
GFR, Estimated: >60 mL/min (ref >60)
Glucose, Bld: 313 mg/dL — ABNORMAL HIGH (ref 70–99)
Potassium: 3.2 mmol/L — ABNORMAL LOW (ref 3.5–5.1)
Sodium: 138 mmol/L (ref 135–145)
Total Bilirubin: 0.9 mg/dL (ref 0.0–1.2)
Total Protein: 7 g/dL (ref 6.5–8.1)

## 2024-05-07 LAB — CBC WITH DIFFERENTIAL/PLATELET
Abs Immature Granulocytes: 0.07 10*3/uL (ref 0.00–0.07)
Basophils Absolute: 0.1 10*3/uL (ref 0.0–0.1)
Basophils Relative: 1 %
Eosinophils Absolute: 0.2 10*3/uL (ref 0.0–0.5)
Eosinophils Relative: 2 %
HCT: 48 % — ABNORMAL HIGH (ref 36.0–46.0)
Hemoglobin: 15.9 g/dL — ABNORMAL HIGH (ref 12.0–15.0)
Immature Granulocytes: 1 %
Lymphocytes Relative: 18 %
Lymphs Abs: 2 10*3/uL (ref 0.7–4.0)
MCH: 31.7 pg (ref 26.0–34.0)
MCHC: 33.1 g/dL (ref 30.0–36.0)
MCV: 95.6 fL (ref 80.0–100.0)
Monocytes Absolute: 0.9 10*3/uL (ref 0.1–1.0)
Monocytes Relative: 8 %
Neutro Abs: 8 10*3/uL — ABNORMAL HIGH (ref 1.7–7.7)
Neutrophils Relative %: 70 %
Platelets: 242 10*3/uL (ref 150–400)
RBC: 5.02 MIL/uL (ref 3.87–5.11)
RDW: 12.7 % (ref 11.5–15.5)
WBC: 11.2 10*3/uL — ABNORMAL HIGH (ref 4.0–10.5)
nRBC: 0 % (ref 0.0–0.2)

## 2024-05-07 LAB — LIPASE, BLOOD: Lipase: 28 U/L (ref 11–51)

## 2024-05-07 LAB — TSH: TSH: 18.516 u[IU]/mL — ABNORMAL HIGH (ref 0.350–4.500)

## 2024-05-07 MED ORDER — PROMETHAZINE HCL 25 MG RE SUPP: 25.0000 mg | Freq: Four times a day (QID) | RECTAL | 0 refills | Status: AC | PRN

## 2024-05-07 MED ORDER — SODIUM CHLORIDE 0.9 % IV BOLUS
1000.0000 mL | Freq: Once | INTRAVENOUS | Status: AC
  Administered 2024-05-07: 1000 mL via INTRAVENOUS

## 2024-05-07 MED ORDER — DROPERIDOL 2.5 MG/ML IJ SOLN
1.2500 mg | Freq: Once | INTRAMUSCULAR | Status: AC
  Administered 2024-05-07: 1.25 mg via INTRAVENOUS
  Filled 2024-05-07: qty 2

## 2024-05-07 MED ORDER — HYDROCORTISONE SOD SUC (PF) 100 MG IJ SOLR: 100.0000 mg | Freq: Once | INTRAMUSCULAR | Status: DC

## 2024-05-07 MED ORDER — METOCLOPRAMIDE HCL 5 MG/ML IJ SOLN
10.0000 mg | Freq: Once | INTRAMUSCULAR | Status: AC
  Administered 2024-05-07: 10 mg via INTRAVENOUS
  Filled 2024-05-07: qty 2

## 2024-05-07 MED ORDER — DIAZEPAM 5 MG/ML IJ SOLN
2.5000 mg | Freq: Once | INTRAMUSCULAR | Status: AC
  Administered 2024-05-07: 2.5 mg via INTRAVENOUS
  Filled 2024-05-07: qty 2

## 2024-05-07 MED ORDER — HYDROCORTISONE 10 MG PO TABS
10.0000 mg | ORAL_TABLET | Freq: Every day | ORAL | Status: DC
  Administered 2024-05-07: 10 mg via ORAL
  Filled 2024-05-07: qty 1

## 2024-05-07 MED ORDER — IOHEXOL 350 MG/ML SOLN
75.0000 mL | Freq: Once | INTRAVENOUS | Status: AC | PRN
  Administered 2024-05-07: 75 mL via INTRAVENOUS

## 2024-05-07 NOTE — ED Triage Notes (Signed)
 Pt arrived via ems from home. abd pain started today, vomiting, found crawling on her floor at home.

## 2024-05-07 NOTE — ED Provider Notes (Signed)
 [place] EMERGENCY DEPARTMENT AT [facility] Provider Note   CSN: [id number] Arrival date & time: 05/07/24  1247     History Brain tumor No chief complaint on file.   [name] [name] is a 63 y.o. female.  63 y.o female with no PMH presents to the ED via EMS with a chief complaint of nausea, vomiting and abdominal pain x this morning.  Patient reports waking up with some nausea, vomiting, was crawling on her floor due to uncontrolled vomiting.  She does report trying to eat a protein bar this morning, and states that this came right back up.  She has a prior history of brain cancer, is currently on medication for adrenal insufficiency including hydrocortisone  daily to help with blood pressure control.  She is endorsing chills as well.  No prior episodes similar to these. No fever, no hematemesis, no diarrhea.  Last bowel movement while in the ED.    The history is provided by the patient.       Home Medications Prior to Admission medications   Medication Sig Start Date End Date Taking? Authorizing Provider  alendronate (FOSAMAX) 70 MG tablet Take 70 mg by mouth once a week.   Yes [provider]  aspirin  EC 81 MG tablet Take 1 tablet (81 mg total) by mouth daily. Swallow whole. 10/26/22  Yes Arty Binning, MD  calcium  carbonate (SUPER CALCIUM ) 1500 (600 Ca) MG TABS tablet 1 tablet with meals Orally Twice a day 10/28/21  Yes [provider]  D 1000 25 MCG (1000 UT) capsule Take 2,000 Units by mouth daily. 06/25/21  Yes [provider]  escitalopram  (LEXAPRO ) 10 MG tablet Take 1 tablet (10 mg total) by mouth daily. 02/19/24  Yes Kuneff, Renee A, DO  gabapentin  (NEURONTIN ) 600 MG tablet Take 1.5 tablets (900 mg total) by mouth at bedtime. 12/04/23  Yes Kuneff, Renee A, DO  Hydrocortisone  (ALKINDI  SPRINKLE) 5 MG CPSP Take 5 mg by mouth daily. 04/04/24  Yes Kuneff, Renee A, DO  levothyroxine  (SYNTHROID , LEVOTHROID) 75 MCG tablet Take 75 mcg by mouth daily  before breakfast.   Yes [provider]  Multiple Vitamin (MULTI-VITAMIN) tablet Take 1 tablet by mouth daily.   Yes [provider]  potassium chloride  (KLOR-CON ) 10 MEQ tablet Take 20 mEq by mouth daily.   Yes [provider]  rosuvastatin  (CRESTOR ) 5 MG tablet Take 1 tablet (5 mg total) by mouth daily. 11/09/22  Yes Arty Binning, MD      Allergies    Morphine  and codeine  and Scopolamine    Review of Systems   Review of Systems  Constitutional:  Negative for chills and fever.  Respiratory:  Negative for shortness of breath.   Cardiovascular:  Negative for chest pain.  Gastrointestinal:  Positive for abdominal pain, nausea and vomiting. Negative for blood in stool, constipation and diarrhea.  All other systems reviewed and are negative.   Physical Exam Updated Vital Signs BP (!) 145/87   Pulse 68   Temp (!) 97.4 F (36.3 C) (Oral)   Resp (!) 23   Ht 5\' 7"  (1.702 m)   Wt 61.2 kg   SpO2 100%   BMI 21.14 kg/m  Physical Exam Vitals and nursing note reviewed.  Constitutional:      General: She is not in acute distress.    Appearance: She is well-developed. She is ill-appearing.  HENT:     Head: Normocephalic and atraumatic.     Mouth/Throat:  Pharynx: No oropharyngeal exudate.  Eyes:     Pupils: Pupils are equal, round, and reactive to light.  Cardiovascular:     Rate and Rhythm: Regular rhythm.     Heart sounds: Normal heart sounds.  Pulmonary:     Effort: Pulmonary effort is normal. No respiratory distress.     Breath sounds: Normal breath sounds.  Abdominal:     General: Bowel sounds are normal. There is distension.     Palpations: Abdomen is soft.     Tenderness: There is abdominal tenderness. There is no right CVA tenderness or left CVA tenderness.  Musculoskeletal:        General: No tenderness or deformity.     Cervical back: Normal range of motion.     Right lower leg: No edema.     Left lower leg: No edema.  Skin:     General: Skin is warm and dry.  Neurological:     Mental Status: She is alert and oriented to person, place, and time.     ED Results / Procedures / Treatments   Labs (all labs ordered are listed, but only abnormal results are displayed) Labs Reviewed  CBC WITH DIFFERENTIAL/PLATELET - Abnormal; Notable for the following components:      Result Value   WBC 11.2 (*)    Hemoglobin 15.9 (*)    HCT 48.0 (*)    Neutro Abs 8.0 (*)    All other components within normal limits  TSH - Abnormal; Notable for the following components:   TSH 18.516 (*)    All other components within normal limits  URINALYSIS, ROUTINE W REFLEX MICROSCOPIC  COMPREHENSIVE METABOLIC PANEL WITH GFR  LIPASE, BLOOD  T4, FREE    EKG None  Radiology No results found.  Procedures Procedures    Medications Ordered in ED Medications  hydrocortisone  (CORTEF ) tablet 10 mg (has no administration in time range)  metoCLOPramide  (REGLAN ) injection 10 mg (10 mg Intravenous Given 05/07/24 1348)  sodium chloride  0.9 % bolus 1,000 mL (1,000 mLs Intravenous New Bag/Given 05/07/24 1348)    ED Course/ Medical Decision Making/ A&P Clinical Course as of 05/07/24 1506  Tue May 07, 2024  1453 TSH(!): 18.516 [JS]    Clinical Course User Index [JS] Martell Mcfadyen, PA-C                                 Medical Decision Making Amount and/or Complexity of Data Reviewed Labs: ordered. Decision-making details documented in ED Course. Radiology: ordered.  Risk Prescription drug management.    This patient presents to the ED for concern of nausea and vomiting, this involves a number of treatment options, and is a complaint that carries with it a high risk of complications and morbidity.  The differential diagnosis includes obstruction, cholecystitis.    Co morbidities: Discussed in HPI   Brief History:  See HPI.   EMR reviewed including pt PMHx, past surgical history and past visits to ER.   See HPI for more  details   Lab Tests:  I ordered and independently interpreted labs.  The pertinent results include:    CBC with a slight leukocytosis of 11.2, CMP is currently pending.   Imaging Studies:  CT abdomen and pelvis pending for further evaluation of intra-abdominal pathology.  Cardiac Monitoring:  {Blank single:19197::"The patient was maintained on a cardiac monitor.  I personally viewed and interpreted the cardiac monitored which showed an underlying rhythm  of:","NA"} {Blank single:19197::"EKG non-ischemic","NA"}   Medicines ordered:  I ordered medication including Reglan , bolus for symptomatic improvement. Reevaluation of the patient after these medicines showed that the patient stayed the same I have reviewed the patients home medicines and have made adjustments as needed   Critical Interventions:   Patient with underlying history of adrenal insufficiency, given hydrocortisone  tablet of 10 mg while in the emergency department.   Consults:  I requested consultation with ***,  and discussed lab and imaging findings as well as pertinent plan - they recommend: ***  Reevaluation:  After the interventions noted above I re-evaluated patient and found that they have :{resolved/improved/worsened:23923::"improved"}   Social Determinants of Health:  The patient's social determinants of health were a factor in the care of this patient  Problem List / ED Course:  Patient presented to the ED with a chief complaint of nausea, vomiting, abdominal pain which began yesterday.  Reports she also has a history of adrenal insufficiency, she is currently managed by Dr. Fairy Homer at [place].  She is currently on Hydro cortisone 5 mg 4 times a day, reports she did take this medication this morning however immediately had an episode of emesis after.  According to records reviewed along with a note from Apr 11, 2024 by Dr. Fairy Homer where he states.  Double your hydrocortisone  dose with illness/stress  events.  I attempted to provide patient with hydrocortisone  IV as she is currently vomiting, however she tells me that she only wants to take this orally.  She is also on Synthroid  and continues to take 75 mcgs 6 days a week.   Dispostion:  Patient care signed to incoming provider pending CT abdomen and pelvis.   Portions of this note were generated with Scientist, clinical (histocompatibility and immunogenetics). Dictation errors may occur despite best attempts at proofreading.   Final Clinical Impression(s) / ED Diagnoses Final diagnoses:  Nausea and vomiting, unspecified vomiting type    Rx / DC Orders ED Discharge Orders     None

## 2024-05-07 NOTE — Discharge Instructions (Signed)
Your exam shows you have had an episode of vertigo, which causes a false sense of movement such as a spinning feeling or walls that seem to move.  Most vertigo is caused by a (usually temporary) problem in the inner ear. Rarely, the back part of the brain can cause vertigo (some mini-strokes / TIA's / strokes), but it appears to be a low risk cause for you at this time. It is important to follow-up with your doctor however, to see if you need further testing.  °Do not drive or participate in potentially dangerous activities requiring balance unless off meds (not drowsy) and the vertigo has resolved. °Most of the time benign vertigo is much better after a few days. However, mild unsteadiness may last for up to 3 months in some patients. An MRI scan or other special tests to evaluate your hearing and balance may be needed if the vertigo does not improve or returns in the future. RETURN IMMEDIATELY IF YOU HAVE ANY OF THE FOLLOWING (call 911): °Increasing vertigo, earache, ear drainage, or loss of hearing.  °Severe headache, blurred or double vision, or trouble walking.  °Fainting or poorly responsive, extreme weakness, chest pain, or palpitations.  °Fever, persistent vomiting, or dehydration.  °Numbness, tingling, incoordination, or weakness of the limbs.  °Change in speech, vision, swallowing, understanding, or other concerns. °

## 2024-05-07 NOTE — ED Provider Notes (Signed)
 Here with N/V Abd has a history of redeem adrenal insufficiency.  Patient was offered her dose of hydrocortisone  IV but refused it.  She still having severe nausea awaiting CT scan and CMP. Physical Exam  BP (!) 145/87   Pulse 68   Temp (!) 97.4 F (36.3 C) (Oral)   Resp (!) 23   Ht 5\' 7"  (1.702 m)   Wt 61.2 kg   SpO2 100%   BMI 21.14 kg/m   Physical Exam  Procedures  Procedures  ED Course / MDM   Clinical Course as of 05/07/24 1648  Tue May 07, 2024  1453 TSH(!): 18.516 [JS]  1621 Anion gap: 14 [AH]    Clinical Course User Index [AH] Alylah Blakney, PA-C [JS] Soto, Johana, PA-C   Medical Decision Making Amount and/or Complexity of Data Reviewed Labs: ordered. Decision-making details documented in ED Course. Radiology: ordered.  Risk Prescription drug management.   Patient's MP and CT have resulted.  She does have a significantly elevated blood glucose likely due to her chronic use of hydrocortisone .  Her nausea is resolved at this time.  She would like to be discharged with Phenergan  suppositories for home use.  She appears otherwise appropriate for discharge no active vomiting here in the emergency department throughout my visit and/or the visit of the previous provider.       Tama Fails, PA-C 05/07/24 1649    Merdis Stalling, MD 05/07/24 [phone]

## 2024-05-09 ENCOUNTER — Ambulatory Visit: Payer: Self-pay

## 2024-05-09 ENCOUNTER — Ambulatory Visit: Payer: Self-pay | Admitting: *Deleted

## 2024-05-09 NOTE — Telephone Encounter (Signed)
 Sent pt a mychart. This can be discussed tomorrow at pts appt.

## 2024-05-09 NOTE — Telephone Encounter (Signed)
 FYI Only or Action Required?: FYI only for provider  Patient was last seen in primary care on 02/19/2024 by Napolean Backbone A, DO. Called Nurse Triage reporting No chief complaint on file.. Symptoms began today. Interventions attempted: Nothing. Symptoms are: unchanged.  Triage Disposition: See Physician Within 24 Hours  Patient/caregiver understands and will follow disposition?: Yes  Copied from CRM [id number]. Topic: Clinical - Red Word Triage >> May 09, 2024  8:02 AM Clyde Darling P wrote: Red Word that prompted transfer to Nurse Triage: nausea and vomitting getting worst- was released from ER 06/10  Reason for Disposition  [1] MILD or MODERATE vomiting AND [2] present > 48 hours (2 days) (Exception: Mild vomiting with associated diarrhea.)  Answer Assessment - Initial Assessment Questions 1. VOMITING SEVERITY: How many times have you vomited in the past 24 hours?     - MILD:  1 - 2 times/day    - MODERATE: 3 - 5 times/day, decreased oral intake without significant weight loss or symptoms of dehydration    - SEVERE: 6 or more times/day, vomits everything or nearly everything, with significant weight loss, symptoms of dehydration      Mild to Moderate  2. ONSET: When did the vomiting begin?      Yesterday  3. FLUIDS: What fluids or food have you vomited up today? Have you been able to keep any fluids down?     Yes  4. ABDOMEN PAIN: Are your having any abdomen pain? If Yes : How bad is it and what does it feel like? (e.g., crampy, dull, intermittent, constant)      Generalized, 3-4, Dull ache  5. DIARRHEA: Is there any diarrhea? If Yes, ask: How many times today?      No  6. CONTACTS: Is there anyone else in the family with the same symptoms?      No  7. CAUSE: What do you think is causing your vomiting?     Unsure  8. HYDRATION STATUS: Any signs of dehydration? (e.g., dry mouth [not only dry lips], too weak to stand) When did you last urinate?     Dry lips, Dry  Mouth  9. OTHER SYMPTOMS: Do you have any other symptoms? (e.g., fever, headache, vertigo, vomiting blood or coffee grounds, recent head injury)     Nausea,   10. PREGNANCY: Is there any chance you are pregnant? When was your last menstrual period?       No and No  Recently discharged on 05/07/2024  Protocols used: Vomiting-A-AH

## 2024-05-09 NOTE — Telephone Encounter (Signed)
 Pt was scheduled

## 2024-05-09 NOTE — Telephone Encounter (Signed)
 Copied from CRM [id number]. Topic: Clinical - Red Word Triage >> May 09, 2024  9:20 AM Adonis Hoot wrote: Red Word that prompted transfer to Nurse Triage: Nausea ,vomiting

## 2024-05-09 NOTE — Telephone Encounter (Signed)
 Pt calling in requesting home health to be arranged for her.   She is having vomiting. I noticed she has already been triaged this morning for this by Marit Sides, RN and scheduled for 05/10/2024 at 11:40 with Dr. Marylee Snowball for this.  Since she is requesting home health services I had the Patient Access Specialist transfer this call into the practice since nurse triage cannot arrange these services.

## 2024-05-10 ENCOUNTER — Encounter: Payer: Self-pay | Admitting: Family Medicine

## 2024-05-10 ENCOUNTER — Ambulatory Visit: Admitting: Family Medicine

## 2024-05-10 VITALS — BP 138/76 | HR 102 | Temp 98.0°F | Wt 114.0 lb

## 2024-05-10 DIAGNOSIS — E2740 Unspecified adrenocortical insufficiency: Secondary | ICD-10-CM | POA: Diagnosis not present

## 2024-05-10 DIAGNOSIS — R112 Nausea with vomiting, unspecified: Secondary | ICD-10-CM

## 2024-05-10 DIAGNOSIS — E876 Hypokalemia: Secondary | ICD-10-CM | POA: Diagnosis not present

## 2024-05-10 DIAGNOSIS — E063 Autoimmune thyroiditis: Secondary | ICD-10-CM | POA: Diagnosis not present

## 2024-05-10 DIAGNOSIS — Z7985 Long-term (current) use of injectable non-insulin antidiabetic drugs: Secondary | ICD-10-CM

## 2024-05-10 DIAGNOSIS — E1165 Type 2 diabetes mellitus with hyperglycemia: Secondary | ICD-10-CM | POA: Diagnosis not present

## 2024-05-10 DIAGNOSIS — R7309 Other abnormal glucose: Secondary | ICD-10-CM | POA: Diagnosis not present

## 2024-05-10 DIAGNOSIS — E119 Type 2 diabetes mellitus without complications: Secondary | ICD-10-CM | POA: Insufficient documentation

## 2024-05-10 HISTORY — DX: Hypokalemia: E87.6

## 2024-05-10 LAB — POCT GLYCOSYLATED HEMOGLOBIN (HGB A1C)
HbA1c POC (<> result, manual entry): 8.3 % (ref 4.0–5.6)
HbA1c, POC (controlled diabetic range): 8.3 % — AB (ref 0.0–7.0)
HbA1c, POC (prediabetic range): 8.3 % — AB (ref 5.7–6.4)
Hemoglobin A1C: 8.3 % — AB (ref 4.0–5.6)

## 2024-05-10 MED ORDER — ONDANSETRON 4 MG PO TBDP: 4.0000 mg | ORAL_TABLET | Freq: Three times a day (TID) | ORAL | 5 refills | Status: DC | PRN

## 2024-05-10 MED ORDER — LEVOTHYROXINE SODIUM 100 MCG PO TABS: 100.0000 ug | ORAL_TABLET | Freq: Every day | ORAL | 0 refills | Status: DC

## 2024-05-10 MED ORDER — METFORMIN HCL 500 MG PO TABS
500.0000 mg | ORAL_TABLET | Freq: Two times a day (BID) | ORAL | 1 refills | Status: AC
Start: 2024-05-10 — End: ?

## 2024-05-10 NOTE — Progress Notes (Signed)
 [name] [name] , 06-26-1961, 63 y.o., female MRN: [id number] Patient Care Team    Relationship Specialty Notifications Start End  Mariel Shope, DO PCP - General Family Medicine  12/04/23   Herman Longs, MD Referring Physician Radiation Oncology  12/04/23   Hugh Madura, MD Consulting Physician Cardiology  12/04/23   Baron Border, MD Referring Physician Endocrinology  12/04/23   Angelia Kelp, MD Referring Physician Internal Medicine  12/04/23    Comment: oncologist for Melanoma  [name]    12/04/23     Chief Complaint  Patient presents with   Hospitalization Follow-up    ED F/U.      Subjective: [name] [name] is a 63 y.o. Pt presents for an OV for ED follow up. Pt presented to the ED 05/07/2024 with complaints of nausea and vomiting.  Blood pressure was elevated 145/87. afebrile. She admits to feeling better after IVF in ED. Elevated glucose >300, suspected to be from her chronic hydrocortisone  use for AI and sugary diet. She was provided with phenergan  suppositories and dc home. Potassium 3.2, glucose 313. Alt 48. Wbc 11.2, TSH 18.5 (prior 17.5 1 mo prior. ) Glucose in urine.  She reports her hydrocortisone  dose had remained the same for her AI.  Her tsh was 17.5 mid May and Dr. Marcella Serge increased her levo 75 mcg from 6 days a week to 7 days a week. Pt reports she has been compliant with levo 75 mcg every day- 7 days a week, however her TSH lab in ED 6/10 her TSH was 18.5. She has a friend with her today and the friend reports she has noticed [name] [name] slurring her words sometimes, not all the time and being forgetful.      04/01/2024    4:46 PM 02/19/2024    7:54 AM 12/04/2023   11:23 AM 10/27/2016   10:36 AM 07/11/2016    3:26 PM  Depression screen PHQ 2/9  Decreased Interest  1 0 0 1  Down, Depressed, Hopeless  0 0 0   PHQ - 2 Score  1 0 0 1  Altered sleeping       Tired, decreased energy  1     Change in appetite  1     Feeling  bad or failure about yourself        Trouble concentrating  0     Moving slowly or fidgety/restless  1     Suicidal thoughts  0     PHQ-9 Score       Difficult doing work/chores  Not difficult at all        Information is confidential and restricted. Go to Review Flowsheets to unlock data.    Allergies  Allergen Reactions   Morphine  And Codeine  Nausea And Vomiting   Scopolamine Rash   Social History   Social History Narrative   Marital status/children/pets: Widowed.  No children.   Education/employment: 2 years of college, retired/disabled   Right handed      Past Medical History:  Diagnosis Date   Abnormal electrocardiogram 07/30/2014   Absence seizure (HCC) 12/04/2023   Acute encephalopathy 03/21/2015   Anxiety    Brain cancer (HCC)    Candidiasis of mouth 12/16/2013   Colitis 03/17/2014   GERD (gastroesophageal reflux disease)    Hyperacusia    Hypothyroidism    Left homonymous hemianopsia    secondary to right posterior temporal and occipital lesions.   Lung cancer (  HCC)    Lung nodule 11/16/2013   Metastatic melanoma- wedge resection 03/05/2014     Melanoma (HCC)    With metastasis to lung and brain   Nasal valve collapse 06/23/2021   Other headache syndrome    Pyelonephritis 12/2020   Syncope and collapse 03/31/2015   TMJ (dislocation of temporomandibular joint)    Past Surgical History:  Procedure Laterality Date   APPENDECTOMY  1979   LUMBAR WOUND DEBRIDEMENT N/A 12/05/2013   Procedure: irrigation and debridement posterior cervical wound;  Surgeon: Isadora Mar, MD;  Location: [facility];  Service: Neurosurgery;  Laterality: N/A;   SUBOCCIPITAL CRANIECTOMY CERVICAL LAMINECTOMY N/A 11/19/2013   Procedure: SUBOCCIPITAL CRANIECTOMY for tumor;  Surgeon: Isadora Mar, MD;  Location: [facility];  Service: Neurosurgery;  Laterality: N/A;   TONSILLECTOMY     VIDEO ASSISTED THORACOSCOPY (VATS)/WEDGE RESECTION Left 03/05/2014   Procedure: VIDEO ASSISTED  THORACOSCOPY (VATS)/WEDGE RESECTION;  Surgeon: Zelphia Higashi, MD;  Location: [facility] OR;  Service: Thoracic;  Laterality: Left;   Family History  Problem Relation Age of Onset   Alzheimer's disease Mother    Allergies as of 05/10/2024       Reactions   Morphine  And Codeine  Nausea And Vomiting   Scopolamine Rash        Medication List        Accurate as of May 10, 2024 12:28 PM. If you have any questions, ask your nurse or doctor.          STOP taking these medications    Alkindi  Sprinkle 5 MG Cpsp Generic drug: Hydrocortisone  Stopped by: Napolean Backbone       TAKE these medications    alendronate 70 MG tablet Commonly known as: FOSAMAX Take 70 mg by mouth once a week.   aspirin  EC 81 MG tablet Take 1 tablet (81 mg total) by mouth daily. Swallow whole.   D 1000 25 MCG (1000 UT) capsule Generic drug: Cholecalciferol Take 2,000 Units by mouth daily.   escitalopram  10 MG tablet Commonly known as: Lexapro  Take 1 tablet (10 mg total) by mouth daily.   gabapentin  600 MG tablet Commonly known as: NEURONTIN  Take 1.5 tablets (900 mg total) by mouth at bedtime.   levothyroxine  100 MCG tablet Commonly known as: SYNTHROID  Take 1 tablet (100 mcg total) by mouth daily. What changed:  medication strength how much to take when to take this Changed by: Napolean Backbone   metFORMIN 500 MG tablet Commonly known as: GLUCOPHAGE Take 1 tablet (500 mg total) by mouth 2 (two) times daily with a meal. Started by: Jackelin Correia   Multi-Vitamin tablet Take 1 tablet by mouth daily.   ondansetron  4 MG disintegrating tablet Commonly known as: ZOFRAN -ODT Take 1 tablet (4 mg total) by mouth every 8 (eight) hours as needed for nausea or vomiting. Started by: Bellah Alia   potassium chloride  10 MEQ tablet Commonly known as: KLOR-CON  Take 20 mEq by mouth daily.   promethazine  25 MG suppository Commonly known as: PHENERGAN  Place 1 suppository (25 mg total) rectally every 6  (six) hours as needed for nausea or vomiting.   rosuvastatin  5 MG tablet Commonly known as: CRESTOR  Take 1 tablet (5 mg total) by mouth daily.   Super Calcium  1500 (600 Ca) MG Tabs tablet Generic drug: calcium  carbonate 1 tablet with meals Orally Twice a day        All past medical history, surgical history, allergies, family history, immunizations andmedications were updated in the EMR  today and reviewed under the history and medication portions of their EMR.     ROS Negative, with the exception of above mentioned in HPI   Objective:  BP 138/76   Pulse (!) 102   Temp 98 F (36.7 C)   Wt 114 lb (51.7 kg)   SpO2 96%   BMI 17.85 kg/m  Body mass index is 17.85 kg/m. Physical Exam Vitals and nursing note reviewed.  Constitutional:      General: She is not in acute distress.    Appearance: Normal appearance. She is not ill-appearing, toxic-appearing or diaphoretic.  HENT:     Head: Normocephalic and atraumatic.   Eyes:     General: No scleral icterus.       Right eye: No discharge.        Left eye: No discharge.     Extraocular Movements: Extraocular movements intact.     Conjunctiva/sclera: Conjunctivae normal.     Pupils: Pupils are equal, round, and reactive to light.   Neck:     Comments: No thyromegaly Cardiovascular:     Rate and Rhythm: Normal rate and regular rhythm.     Heart sounds: No murmur heard. Pulmonary:     Effort: Pulmonary effort is normal. No respiratory distress.     Breath sounds: Normal breath sounds. No wheezing, rhonchi or rales.   Musculoskeletal:     Cervical back: Neck supple. No tenderness.     Right lower leg: No edema.     Left lower leg: No edema.  Lymphadenopathy:     Cervical: No cervical adenopathy.   Skin:    General: Skin is warm.     Findings: No rash.   Neurological:     Mental Status: She is alert and oriented to person, place, and time. Mental status is at baseline.     Motor: No weakness.     Gait: Gait normal.    Psychiatric:        Mood and Affect: Mood normal.        Behavior: Behavior normal.        Thought Content: Thought content normal.        Judgment: Judgment normal.     No results found. No results found. Results for orders placed or performed in visit on 05/10/24 (from the past 24 hours)  POCT HgB A1C     Status: Abnormal   Collection Time: 05/10/24 11:47 AM  Result Value Ref Range   Hemoglobin A1C 8.3 (A) 4.0 - 5.6 %   HbA1c POC (<> result, manual entry) 8.3 4.0 - 5.6 %   HbA1c, POC (prediabetic range) 8.3 (A) 5.7 - 6.4 %   HbA1c, POC (controlled diabetic range) 8.3 (A) 0.0 - 7.0 %    Assessment/Plan: [name] [name] is a 63 y.o. female present for OV for  Elevated glucose - POCT HgB A1C> 8.3 - Comp Met (CMET)  Hypokalemia 3.2 at emergency room> encouraged her to take an extra 10 meq for 2 days, further guidance after I get labs back.  - Comp Met (CMET)  New onset type 2 diabetes mellitus (HCC) (Primary) A1c> 8.3 today Start metformin 500 BID AC  Start diabetic diet- sugar free products only, no soda.  Increase water  consumption to at least 80 ounces daily. Chronic steroid use required for her adrenal insufficiency. Follow up in 2 weeks. Will recheck glucose  then and provide diabetic education .  Will need 2nd diabetic agent, hesitant to start sulfonylurea or insulin, since  she skips meals. She also seems to consume fair amount of sugar/carbs.  Given her 2 weeks to start to improve her diet, get used to metformin and then sugar recheck in 2 weeks.  Would add on medication at that time.  Hypothyroidism due to Hashimoto thyroiditis Patient reports compliance on levothyroxine  75 mcg daily-7 days a week.  Her TSH had increased from her endocrine appointment 4 weeks ago. Increased levothyroxine  to 100 mcg daily.  Encouraged her to inform her endocrine of the changes secondary to increasing TSH. She has a lab appointment in 4 weeks at her endocrine, encouraged her to  inform her endocrine and they will likely have her postpone the lab for 6 to 8 weeks from today since we alter the dose.  Nausea and vomiting, unspecified vomiting type Possibly from hyperglycemia Zofran  prescribed Will reevaluate 2 weeks  Reviewed expectations re: course of current medical issues. Discussed self-management of symptoms. Outlined signs and symptoms indicating need for more acute intervention. Patient verbalized understanding and all questions were answered. Patient received an After-Visit Summary.    Orders Placed This Encounter  Procedures   Comp Met (CMET)   POCT HgB A1C   Meds ordered this encounter  Medications   levothyroxine  (SYNTHROID ) 100 MCG tablet    Sig: Take 1 tablet (100 mcg total) by mouth daily.    Dispense:  90 tablet    Refill:  0   ondansetron  (ZOFRAN -ODT) 4 MG disintegrating tablet    Sig: Take 1 tablet (4 mg total) by mouth every 8 (eight) hours as needed for nausea or vomiting.    Dispense:  20 tablet    Refill:  5   metFORMIN (GLUCOPHAGE) 500 MG tablet    Sig: Take 1 tablet (500 mg total) by mouth 2 (two) times daily with a meal.    Dispense:  180 tablet    Refill:  1   Referral Orders  No referral(s) requested today   65 minutes spent during patient encounter on day of service covering ED visit, new onset diabetes, worsening hypothyroid, placing orders and starting new meds with patient today.  Note is dictated utilizing voice recognition software. Although note has been proof read prior to signing, occasional typographical errors still can be missed. If any questions arise, please do not hesitate to call for verification.   electronically signed by:  Napolean Backbone, DO  [facility] [place]

## 2024-05-10 NOTE — Patient Instructions (Signed)
 Return in about 2 weeks (around 05/24/2024) for Routine chronic condition follow-up.        Great to see you today.  I have refilled the medication(s) we provide.   If labs were collected or images ordered, we will inform you of  results once we have received them and reviewed. We will contact you either by echart message, or telephone call.  Please give ample time to the testing facility, and our office to run,  receive and review results. Please do not call inquiring of results, even if you can see them in your chart. We will contact you as soon as we are able. If it has been over 1 week since the test was completed, and you have not yet heard from us , then please call us .    - echart message- for normal results that have been seen by the patient already.   - telephone call: abnormal results or if patient has not viewed results in their echart.  If a referral to a specialist was entered for you, please call us  in 2 weeks if you have not heard from the specialist office to schedule.

## 2024-05-11 ENCOUNTER — Encounter: Payer: Self-pay | Admitting: Family Medicine

## 2024-05-11 DIAGNOSIS — R739 Hyperglycemia, unspecified: Secondary | ICD-10-CM | POA: Diagnosis not present

## 2024-05-11 DIAGNOSIS — R112 Nausea with vomiting, unspecified: Secondary | ICD-10-CM | POA: Diagnosis not present

## 2024-05-11 LAB — COMPREHENSIVE METABOLIC PANEL WITH GFR
AG Ratio: 1.4 (calc) (ref 1.0–2.5)
ALT: 30 U/L — ABNORMAL HIGH (ref 6–29)
AST: 22 U/L (ref 10–35)
Albumin: 4.6 g/dL (ref 3.6–5.1)
Alkaline phosphatase (APISO): 113 U/L (ref 37–153)
BUN: 20 mg/dL (ref 7–25)
CO2: 20 mmol/L (ref 20–32)
Calcium: 10.1 mg/dL (ref 8.6–10.4)
Chloride: 95 mmol/L — ABNORMAL LOW (ref 98–110)
Creat: 1.05 mg/dL (ref 0.50–1.05)
Globulin: 3.2 g/dL (ref 1.9–3.7)
Glucose, Bld: 446 mg/dL — ABNORMAL HIGH (ref 65–99)
Potassium: 4.6 mmol/L (ref 3.5–5.3)
Sodium: 129 mmol/L — ABNORMAL LOW (ref 135–146)
Total Bilirubin: 0.9 mg/dL (ref 0.2–1.2)
Total Protein: 7.8 g/dL (ref 6.1–8.1)
eGFR: 60 mL/min/{1.73_m2} (ref >=60)

## 2024-05-12 ENCOUNTER — Encounter: Payer: Self-pay | Admitting: Family Medicine

## 2024-05-13 ENCOUNTER — Ambulatory Visit (HOSPITAL_COMMUNITY): Admitting: Licensed Clinical Social Worker

## 2024-05-14 ENCOUNTER — Telehealth (INDEPENDENT_AMBULATORY_CARE_PROVIDER_SITE_OTHER): Admitting: Family Medicine

## 2024-05-14 ENCOUNTER — Ambulatory Visit: Payer: Self-pay

## 2024-05-14 ENCOUNTER — Telehealth: Payer: Self-pay

## 2024-05-14 ENCOUNTER — Encounter: Payer: Self-pay | Admitting: Family Medicine

## 2024-05-14 DIAGNOSIS — E871 Hypo-osmolality and hyponatremia: Secondary | ICD-10-CM

## 2024-05-14 DIAGNOSIS — E063 Autoimmune thyroiditis: Secondary | ICD-10-CM

## 2024-05-14 DIAGNOSIS — Z743 Need for continuous supervision: Secondary | ICD-10-CM | POA: Diagnosis not present

## 2024-05-14 DIAGNOSIS — N289 Disorder of kidney and ureter, unspecified: Secondary | ICD-10-CM | POA: Diagnosis not present

## 2024-05-14 DIAGNOSIS — R519 Headache, unspecified: Secondary | ICD-10-CM | POA: Diagnosis not present

## 2024-05-14 DIAGNOSIS — E2740 Unspecified adrenocortical insufficiency: Secondary | ICD-10-CM

## 2024-05-14 DIAGNOSIS — I700 Atherosclerosis of aorta: Secondary | ICD-10-CM | POA: Diagnosis not present

## 2024-05-14 DIAGNOSIS — R000 Tachycardia, unspecified: Secondary | ICD-10-CM | POA: Diagnosis not present

## 2024-05-14 DIAGNOSIS — R1111 Vomiting without nausea: Secondary | ICD-10-CM | POA: Diagnosis not present

## 2024-05-14 DIAGNOSIS — E872 Acidosis, unspecified: Secondary | ICD-10-CM | POA: Diagnosis not present

## 2024-05-14 DIAGNOSIS — E119 Type 2 diabetes mellitus without complications: Secondary | ICD-10-CM | POA: Insufficient documentation

## 2024-05-14 DIAGNOSIS — R110 Nausea: Secondary | ICD-10-CM | POA: Diagnosis not present

## 2024-05-14 DIAGNOSIS — E860 Dehydration: Secondary | ICD-10-CM | POA: Diagnosis not present

## 2024-05-14 DIAGNOSIS — R112 Nausea with vomiting, unspecified: Secondary | ICD-10-CM | POA: Diagnosis not present

## 2024-05-14 HISTORY — DX: Hypo-osmolality and hyponatremia: E87.1

## 2024-05-14 MED ORDER — ONDANSETRON 4 MG PO TBDP: 4.0000 mg | ORAL_TABLET | Freq: Three times a day (TID) | ORAL | 5 refills | Status: AC | PRN

## 2024-05-14 MED ORDER — METFORMIN HCL 500 MG PO TABS: 500.0000 mg | ORAL_TABLET | Freq: Two times a day (BID) | ORAL | 1 refills | Status: DC

## 2024-05-14 NOTE — Telephone Encounter (Signed)
 FYI Only or Action Required?: Action required by provider- patient wants PCP to call the ED and give them report to hasten her care  Patient was last seen in primary care on 05/14/2024 by [name] [name], Renee A, DO. Called Nurse Triage reporting Advice Only. Symptoms began today. Interventions attempted: Other: in ED, seeking medical care. Symptoms are: feeling horrible, elevated blood glucose (reports it was over 400) unchanged.  Triage Disposition: Call PCP Now  Patient/caregiver understands and will follow disposition?: Yes                  Copied from CRM [id number]. Topic: Clinical - Medication Question >> May 14, 2024 10:05 AM Keitha Pata L wrote: Reason for CRM: patient wants a call from the Doctor in reference to how bad she feels. Offered to have her speak to the nurse and she declined >> May 14, 2024  3:05 PM [place] J wrote: Patient is calling in distress because she's been waiting in the ER for 3 hours now and states that she is needing an IV. She said she had an appointment today to go over medication reactions and was asked to rush to be admitted to the hospital. Reason for Disposition  Call about patient who is currently hospitalized  Answer Assessment - Initial Assessment Questions 1. REASON FOR CALL or QUESTION: What is your reason for calling today? or How can I best help you? or What question do you have that I can help answer?     Patient states she was told to go to ED by her PCP Dr [name] [name]. Her endocrinologist also called her and she states told her she needed to go to the ED, get fluids and be admitted. She states she is in the [facility] ED. She states she feels horrible and would like her PCP to call the ED to help move her care along. Patient tearful during triage. While speaking with patient, the ED provider entered the room to assess patient and patient needed to hang up.   2. CALLER: Document the source of call. (e.g.,  laboratory, patient).     Patient.  Protocols used: PCP Call - No Triage-A-AH

## 2024-05-14 NOTE — Progress Notes (Signed)
 VIRTUAL VISIT VIA VIDEO  I connected with [name] [name] on 05/14/24 at 11:20 AM EDT by a video enabled telemedicine application and verified that I am speaking with the correct person using two identifiers. Location patient: Home Location provider: [facility], Office Persons participating in the virtual visit: Patient, Dr. Marylee Snowball and Pressley Brome, CMA  I discussed the limitations of evaluation and management by telemedicine and the availability of in person appointments. The patient expressed understanding and agreed to proceed.    [name] [name] , Feb 19, 1961, 63 y.o., female MRN: [id number] Patient Care Team    Relationship Specialty Notifications Start End  Mariel Shope, DO PCP - General Family Medicine  12/04/23   Herman Longs, MD Referring Physician Radiation Oncology  12/04/23   Hugh Madura, MD Consulting Physician Cardiology  12/04/23   Baron Border, MD Referring Physician Endocrinology  12/04/23   Angelia Kelp, MD Referring Physician Internal Medicine  12/04/23    Comment: oncologist for Melanoma  [name]    12/04/23     Chief Complaint  Patient presents with   Nausea    Ongoing nausea & vomiting. Pt has not been able to find any kind of relief.      Subjective: [name] [name] is a 63 y.o. Pt presents for an OV with complaints of continued nausea and vomit over the weekend. She has not been able to tolerate much by PO. Hel d down a small amount of food yesterday.  Pt also states she did not get the three medications I prescribed Friday yet, which were called into her mail in pharmacy.  She presented to the ED and they gave her a bag of fluids, zofran  and metformin.  Her endocrine called in levothyroxine  88 mcg, but she did not start it yet.      04/01/2024    4:46 PM 02/19/2024    7:54 AM 12/04/2023   11:23 AM 10/27/2016   10:36 AM 07/11/2016    3:26 PM  Depression screen PHQ 2/9  Decreased Interest  1 0 0 1   Down, Depressed, Hopeless  0 0 0   PHQ - 2 Score  1 0 0 1  Altered sleeping       Tired, decreased energy  1     Change in appetite  1     Feeling bad or failure about yourself        Trouble concentrating  0     Moving slowly or fidgety/restless  1     Suicidal thoughts  0     PHQ-9 Score       Difficult doing work/chores  Not difficult at all        Information is confidential and restricted. Go to Review Flowsheets to unlock data.    Allergies  Allergen Reactions   Morphine  And Codeine  Nausea And Vomiting   Scopolamine Rash   Social History   Social History Narrative   Marital status/children/pets: Widowed.  No children.   Education/employment: 2 years of college, retired/disabled   Right handed      Past Medical History:  Diagnosis Date   Abnormal electrocardiogram 07/30/2014   Absence seizure (HCC) 12/04/2023   Acute encephalopathy 03/21/2015   Anxiety    Brain cancer (HCC)    Candidiasis of mouth 12/16/2013   Colitis 03/17/2014   GERD (gastroesophageal reflux disease)    Hyperacusia    Hypothyroidism    Left  homonymous hemianopsia    secondary to right posterior temporal and occipital lesions.   Lung cancer (HCC)    Lung nodule 11/16/2013   Metastatic melanoma- wedge resection 03/05/2014     Melanoma (HCC)    With metastasis to lung and brain   Nasal valve collapse 06/23/2021   Other headache syndrome    Pyelonephritis 12/2020   Syncope and collapse 03/31/2015   TMJ (dislocation of temporomandibular joint)    Past Surgical History:  Procedure Laterality Date   APPENDECTOMY  1979   LUMBAR WOUND DEBRIDEMENT N/A 12/05/2013   Procedure: irrigation and debridement posterior cervical wound;  Surgeon: Isadora Mar, MD;  Location: [facility];  Service: Neurosurgery;  Laterality: N/A;   SUBOCCIPITAL CRANIECTOMY CERVICAL LAMINECTOMY N/A 11/19/2013   Procedure: SUBOCCIPITAL CRANIECTOMY for tumor;  Surgeon: Isadora Mar, MD;  Location: [facility];  Service:  Neurosurgery;  Laterality: N/A;   TONSILLECTOMY     VIDEO ASSISTED THORACOSCOPY (VATS)/WEDGE RESECTION Left 03/05/2014   Procedure: VIDEO ASSISTED THORACOSCOPY (VATS)/WEDGE RESECTION;  Surgeon: Zelphia Higashi, MD;  Location: [facility] OR;  Service: Thoracic;  Laterality: Left;   Family History  Problem Relation Age of Onset   Alzheimer's disease Mother    Allergies as of 05/14/2024       Reactions   Morphine  And Codeine  Nausea And Vomiting   Scopolamine Rash        Medication List        Accurate as of May 14, 2024 11:21 AM. If you have any questions, ask your nurse or doctor.          alendronate 70 MG tablet Commonly known as: FOSAMAX Take 70 mg by mouth once a week.   aspirin  EC 81 MG tablet Take 1 tablet (81 mg total) by mouth daily. Swallow whole.   D 1000 25 MCG (1000 UT) capsule Generic drug: Cholecalciferol Take 2,000 Units by mouth daily.   escitalopram  10 MG tablet Commonly known as: Lexapro  Take 1 tablet (10 mg total) by mouth daily.   gabapentin  600 MG tablet Commonly known as: NEURONTIN  Take 1.5 tablets (900 mg total) by mouth at bedtime.   levothyroxine  88 MCG tablet Commonly known as: SYNTHROID  Take 88 mcg by mouth. What changed: Another medication with the same name was removed. Continue taking this medication, and follow the directions you see here. Changed by: Napolean Backbone   metFORMIN 500 MG tablet Commonly known as: GLUCOPHAGE Take 1 tablet (500 mg total) by mouth 2 (two) times daily with a meal.   Multi-Vitamin tablet Take 1 tablet by mouth daily.   ondansetron  4 MG disintegrating tablet Commonly known as: ZOFRAN -ODT Take 1 tablet (4 mg total) by mouth every 8 (eight) hours as needed for nausea or vomiting.   potassium chloride  10 MEQ tablet Commonly known as: KLOR-CON  Take 20 mEq by mouth daily.   promethazine  25 MG suppository Commonly known as: PHENERGAN  Place 1 suppository (25 mg total) rectally every 6 (six) hours as needed  for nausea or vomiting.   rosuvastatin  5 MG tablet Commonly known as: CRESTOR  Take 1 tablet (5 mg total) by mouth daily.   Super Calcium  1500 (600 Ca) MG Tabs tablet Generic drug: calcium  carbonate 1 tablet with meals Orally Twice a day        All past medical history, surgical history, allergies, family history, immunizations andmedications were updated in the EMR today and reviewed under the history and medication portions of their EMR.     ROS Negative,  with the exception of above mentioned in HPI   Objective:  There were no vitals taken for this visit. There is no height or weight on file to calculate BMI.  Physical Exam Vitals and nursing note reviewed.  Constitutional:      General: She is not in acute distress.    Appearance: Normal appearance. She is not toxic-appearing.     Comments: Pt laying in bed, appears ill.   HENT:     Head: Normocephalic and atraumatic.   Eyes:     General: No scleral icterus.       Right eye: No discharge.        Left eye: No discharge.     Conjunctiva/sclera: Conjunctivae normal.   Pulmonary:     Effort: Pulmonary effort is normal.   Musculoskeletal:     Cervical back: Normal range of motion.   Skin:    Findings: No rash.   Neurological:     Mental Status: She is alert and oriented to person, place, and time. Mental status is at baseline.   Psychiatric:        Mood and Affect: Mood normal.        Behavior: Behavior normal.        Thought Content: Thought content normal.        Judgment: Judgment normal.      No results found. No results found. No results found for this or any previous visit (from the past 24 hours).  Assessment/Plan: ELZA VARRICCHIO is a 62 y.o. female present for OV for  Adrenal insufficiency (HCC) (Primary)/ Nausea and vomiting, unspecified vomiting type Likely needs increased dose of steroid for sick coverage .  Hypothyroidism due to Hashimoto thyroiditis Needs increased to the levo 88 mcg  dosing. Pt reports it is at her pharmacy, she has to pick it up.   New onset type 2 diabetes mellitus (HCC) New onset.  With her other endocrine disorders also not in control, TSH high, AI and current illness, there is no safe home treatment to initiate medication for new onset diabetes with her persistent nausea and vomiting not responding to antiemetics.  I recommend she present to the [facility] for eval, IV fluids, and likely admission with insulin coverage for elevated glucose > 400, and AI sick treatment. Once n/v resolves, lytes corrected, glucose corrected, IV fluids provided and pt tolerating PO, then we can proceed with DM coverage or she can discuss with her endocrine.   Pt has a friend over and they plan to take her to the ED now.   Reviewed expectations re: course of current medical issues. Discussed self-management of symptoms. Outlined signs and symptoms indicating need for more acute intervention. Patient verbalized understanding and all questions were answered. Patient received an After-Visit Summary.    No orders of the defined types were placed in this encounter.  Meds ordered this encounter  Medications   ondansetron  (ZOFRAN -ODT) 4 MG disintegrating tablet    Sig: Take 1 tablet (4 mg total) by mouth every 8 (eight) hours as needed for nausea or vomiting.    Dispense:  20 tablet    Refill:  5   metFORMIN (GLUCOPHAGE) 500 MG tablet    Sig: Take 1 tablet (500 mg total) by mouth 2 (two) times daily with a meal.    Dispense:  180 tablet    Refill:  1   Referral Orders  No referral(s) requested today     Note is dictated utilizing voice recognition software.  Although note has been proof read prior to signing, occasional typographical errors still can be missed. If any questions arise, please do not hesitate to call for verification.   electronically signed by:  Napolean Backbone, DO  [facility] [place]

## 2024-05-14 NOTE — Patient Instructions (Signed)
 Advised pt to report to ED.

## 2024-05-14 NOTE — Telephone Encounter (Signed)
 Scheduled pt for same day virtual visit

## 2024-05-14 NOTE — Telephone Encounter (Signed)
 Copied from CRM [id number]. Topic: Clinical - Medication Question >> May 14, 2024 10:05 AM Keitha Pata L wrote: Reason for CRM: patient wants a call from the Doctor in reference to how bad she feels. Offered to have her speak to the nurse and she declined

## 2024-05-15 DIAGNOSIS — E8889 Other specified metabolic disorders: Secondary | ICD-10-CM | POA: Diagnosis not present

## 2024-05-15 DIAGNOSIS — Z888 Allergy status to other drugs, medicaments and biological substances status: Secondary | ICD-10-CM | POA: Diagnosis not present

## 2024-05-15 DIAGNOSIS — R519 Headache, unspecified: Secondary | ICD-10-CM | POA: Diagnosis not present

## 2024-05-15 DIAGNOSIS — R630 Anorexia: Secondary | ICD-10-CM | POA: Diagnosis not present

## 2024-05-15 DIAGNOSIS — Z79890 Hormone replacement therapy (postmenopausal): Secondary | ICD-10-CM | POA: Diagnosis not present

## 2024-05-15 DIAGNOSIS — E872 Acidosis, unspecified: Secondary | ICD-10-CM | POA: Diagnosis not present

## 2024-05-15 DIAGNOSIS — F32A Depression, unspecified: Secondary | ICD-10-CM | POA: Diagnosis not present

## 2024-05-15 DIAGNOSIS — Z7409 Other reduced mobility: Secondary | ICD-10-CM | POA: Diagnosis not present

## 2024-05-15 DIAGNOSIS — E860 Dehydration: Secondary | ICD-10-CM | POA: Diagnosis not present

## 2024-05-15 DIAGNOSIS — C439 Malignant melanoma of skin, unspecified: Secondary | ICD-10-CM | POA: Diagnosis not present

## 2024-05-15 DIAGNOSIS — K219 Gastro-esophageal reflux disease without esophagitis: Secondary | ICD-10-CM | POA: Diagnosis not present

## 2024-05-15 DIAGNOSIS — F419 Anxiety disorder, unspecified: Secondary | ICD-10-CM | POA: Diagnosis not present

## 2024-05-15 DIAGNOSIS — C7802 Secondary malignant neoplasm of left lung: Secondary | ICD-10-CM | POA: Diagnosis not present

## 2024-05-15 DIAGNOSIS — T451X5A Adverse effect of antineoplastic and immunosuppressive drugs, initial encounter: Secondary | ICD-10-CM | POA: Diagnosis not present

## 2024-05-15 DIAGNOSIS — Z7983 Long term (current) use of bisphosphonates: Secondary | ICD-10-CM | POA: Diagnosis not present

## 2024-05-15 DIAGNOSIS — Z7984 Long term (current) use of oral hypoglycemic drugs: Secondary | ICD-10-CM | POA: Diagnosis not present

## 2024-05-15 DIAGNOSIS — Z7982 Long term (current) use of aspirin: Secondary | ICD-10-CM | POA: Diagnosis not present

## 2024-05-15 DIAGNOSIS — R000 Tachycardia, unspecified: Secondary | ICD-10-CM | POA: Diagnosis not present

## 2024-05-15 DIAGNOSIS — C7931 Secondary malignant neoplasm of brain: Secondary | ICD-10-CM | POA: Diagnosis not present

## 2024-05-15 DIAGNOSIS — E440 Moderate protein-calorie malnutrition: Secondary | ICD-10-CM | POA: Diagnosis not present

## 2024-05-15 DIAGNOSIS — Z681 Body mass index (BMI) 19 or less, adult: Secondary | ICD-10-CM | POA: Diagnosis not present

## 2024-05-15 DIAGNOSIS — E039 Hypothyroidism, unspecified: Secondary | ICD-10-CM | POA: Diagnosis not present

## 2024-05-15 DIAGNOSIS — E273 Drug-induced adrenocortical insufficiency: Secondary | ICD-10-CM | POA: Diagnosis not present

## 2024-05-15 DIAGNOSIS — E119 Type 2 diabetes mellitus without complications: Secondary | ICD-10-CM | POA: Diagnosis not present

## 2024-05-15 DIAGNOSIS — R112 Nausea with vomiting, unspecified: Secondary | ICD-10-CM | POA: Diagnosis not present

## 2024-05-16 ENCOUNTER — Encounter: Payer: Self-pay | Admitting: Family Medicine

## 2024-05-18 DIAGNOSIS — E2740 Unspecified adrenocortical insufficiency: Secondary | ICD-10-CM | POA: Diagnosis not present

## 2024-05-18 DIAGNOSIS — Z85841 Personal history of malignant neoplasm of brain: Secondary | ICD-10-CM | POA: Diagnosis not present

## 2024-05-18 DIAGNOSIS — Z7984 Long term (current) use of oral hypoglycemic drugs: Secondary | ICD-10-CM | POA: Diagnosis not present

## 2024-05-18 DIAGNOSIS — E039 Hypothyroidism, unspecified: Secondary | ICD-10-CM | POA: Diagnosis not present

## 2024-05-18 DIAGNOSIS — Z85118 Personal history of other malignant neoplasm of bronchus and lung: Secondary | ICD-10-CM | POA: Diagnosis not present

## 2024-05-18 DIAGNOSIS — E119 Type 2 diabetes mellitus without complications: Secondary | ICD-10-CM | POA: Diagnosis not present

## 2024-05-18 DIAGNOSIS — Z85828 Personal history of other malignant neoplasm of skin: Secondary | ICD-10-CM | POA: Diagnosis not present

## 2024-05-18 DIAGNOSIS — Z9089 Acquired absence of other organs: Secondary | ICD-10-CM | POA: Diagnosis not present

## 2024-05-18 DIAGNOSIS — F32A Depression, unspecified: Secondary | ICD-10-CM | POA: Diagnosis not present

## 2024-05-18 DIAGNOSIS — I351 Nonrheumatic aortic (valve) insufficiency: Secondary | ICD-10-CM | POA: Diagnosis not present

## 2024-05-18 DIAGNOSIS — Z7952 Long term (current) use of systemic steroids: Secondary | ICD-10-CM | POA: Diagnosis not present

## 2024-05-18 DIAGNOSIS — Z7983 Long term (current) use of bisphosphonates: Secondary | ICD-10-CM | POA: Diagnosis not present

## 2024-05-18 DIAGNOSIS — K219 Gastro-esophageal reflux disease without esophagitis: Secondary | ICD-10-CM | POA: Diagnosis not present

## 2024-05-18 DIAGNOSIS — Z87891 Personal history of nicotine dependence: Secondary | ICD-10-CM | POA: Diagnosis not present

## 2024-05-18 DIAGNOSIS — F419 Anxiety disorder, unspecified: Secondary | ICD-10-CM | POA: Diagnosis not present

## 2024-05-20 ENCOUNTER — Ambulatory Visit: Payer: Self-pay

## 2024-05-20 ENCOUNTER — Telehealth: Payer: Self-pay

## 2024-05-20 NOTE — Telephone Encounter (Signed)
 Pt is scheduled

## 2024-05-20 NOTE — Telephone Encounter (Signed)
 Does patient hospital follow-up scheduled?  I have not seen this patient since her hospital discharge

## 2024-05-20 NOTE — Telephone Encounter (Signed)
 Communication  Caller/Agency: [facility]    Callback Number: [phone]    Service Requested: Skilled Nursing    Frequency:    1 time week 1 week 2 time week 1 1 time week 7 weeks diabetic teaching,medication management    Any new concerns about the patient? Yes pt does not have the glucometer to check blood sugar    Okay for orders?

## 2024-05-20 NOTE — Telephone Encounter (Signed)
 FYI Only or Action Required?: FYI only for provider.  Patient was last seen in primary care on 05/14/2024 by Catherine Fuller A, DO. Called Nurse Triage reporting No chief complaint on file.. Symptoms began yesterday. Interventions attempted: Nothing. Symptoms are: unchanged.  Triage Disposition: See HCP Within 4 Hours (Or PCP Triage)  Patient/caregiver understands and will follow disposition?: Yes          Copied from CRM [id number]. Topic: Clinical - Red Word Triage >> May 20, 2024  8:53 AM [name] [name] wrote: Red Word that prompted transfer to Nurse Triage: Low Bp 86/52 Reason for Disposition  [1] Systolic BP < 90 AND [2] NOT dizzy, lightheaded or weak  Answer Assessment - Initial Assessment Questions 1. BLOOD PRESSURE: What is the blood pressure? Did you take at least two measurements 5 minutes apart?     86/52 yesterday 2. ONSET: When did you take your blood pressure?     Nurse was there yesterday and took the pressure 3. HOW: How did you obtain the blood pressure? (e.g., visiting nurse, automatic home BP monitor)     Home health nurse 4. HISTORY: Do you have a history of low blood pressure? What is your blood pressure normally?     yes 5. MEDICINES: Are you taking any medications for blood pressure? If Yes, ask: Have they been changed recently?     no 6. PULSE RATE: Do you know what your pulse rate is?      no 7. OTHER SYMPTOMS: Have you been sick recently? Have you had a recent injury?     Recently lost husband, hx brain cancer 8. PREGNANCY: Is there any chance you are pregnant? When was your last menstrual period?     na  Protocols used: Blood Pressure - Low-A-AH

## 2024-05-21 ENCOUNTER — Encounter: Payer: Self-pay | Admitting: Family Medicine

## 2024-05-21 ENCOUNTER — Ambulatory Visit: Admitting: Family Medicine

## 2024-05-21 VITALS — BP 106/68 | HR 72 | Temp 98.0°F | Wt 116.0 lb

## 2024-05-21 DIAGNOSIS — E2740 Unspecified adrenocortical insufficiency: Secondary | ICD-10-CM

## 2024-05-21 DIAGNOSIS — E119 Type 2 diabetes mellitus without complications: Secondary | ICD-10-CM | POA: Diagnosis not present

## 2024-05-21 DIAGNOSIS — E876 Hypokalemia: Secondary | ICD-10-CM

## 2024-05-21 DIAGNOSIS — R112 Nausea with vomiting, unspecified: Secondary | ICD-10-CM | POA: Diagnosis not present

## 2024-05-21 DIAGNOSIS — E273 Drug-induced adrenocortical insufficiency: Secondary | ICD-10-CM | POA: Diagnosis not present

## 2024-05-21 DIAGNOSIS — E871 Hypo-osmolality and hyponatremia: Secondary | ICD-10-CM

## 2024-05-21 LAB — BASIC METABOLIC PANEL WITH GFR
BUN: 11 mg/dL (ref 6–23)
CO2: 30 meq/L (ref 19–32)
Calcium: 9.6 mg/dL (ref 8.4–10.5)
Chloride: 100 meq/L (ref 96–112)
Creatinine, Ser: 0.65 mg/dL (ref 0.40–1.20)
GFR: 93.82 mL/min (ref >60.00)
Glucose, Bld: 140 mg/dL — ABNORMAL HIGH (ref 70–99)
Potassium: 4.1 meq/L (ref 3.5–5.1)
Sodium: 137 meq/L (ref 135–145)

## 2024-05-21 LAB — MAGNESIUM: Magnesium: 1.5 mg/dL (ref 1.5–2.5)

## 2024-05-21 NOTE — Progress Notes (Signed)
 [name] [name] , Feb 05, 1961, 63 y.o., female MRN: [id number] Patient Care Team    Relationship Specialty Notifications Start End  [name] Charlies DELENA, DO PCP - General Family Medicine  12/04/23   [name] Mose Koyanagi, MD Referring Physician Radiation Oncology  12/04/23   [name] Oneil BROCKS, MD Consulting Physician Cardiology  12/04/23   [name] Rocky Collar, MD Referring Physician Endocrinology  12/04/23   [name] Earing, MD Referring Physician Internal Medicine  12/04/23    Comment: oncologist for Melanoma  [name]    12/04/23     Chief Complaint  Patient presents with   Low Blood Pressure   Hospitalization Follow-up     Subjective:  [name] [name]  is a 63 y.o. female presents for hospital follow up after recent admission on 05/14/2024 for primary diagnosis nausea and vomiting, new onset type 2 diabetes, dehydration, adrenal insufficiency, hypothyroidism was discharged on 05/16/2024 to her home with home health. Patients discharge summary has been reviewed, as well as all labs/image studies obtained during hospitalization.  Medication reconciliation completed today.  Patients hospital course: Patient had reported rather significant nausea and vomiting not tolerating any p.o. went to the ED and admitted.  She was recently diagnosed with new onset diabetes and had hyperglycemia-which was prior to any type of medication started for her diabetes.  She has a history of hypothyroidism, and recent TSH significantly high-again symptoms started before thyroid  medication dose changed. Since hospital discharge patient reports she is feeling much improved.  Getting back to her self.  Has no additional nausea or vomiting and she is tolerating p.o.  She does have endocrine follow-up.  She reports she has an MRI scheduled 05/22/2024 through [facility] for her brain cancer follow-up which she completes yearly.  No results for input(s): HGB, HCT, WBC, PLT in the last 168 hours.     Latest Ref Rng & Units 05/10/2024   12:11 PM 05/07/2024    2:30 PM 12/22/2022   10:00 AM  CMP  Glucose 65 - 99 mg/dL 553  686    BUN 7 - 25 mg/dL 20  11    Creatinine 9.49 - 1.05 mg/dL 8.94  9.27    Sodium 864 - 146 mmol/L 129  138    Potassium 3.5 - 5.3 mmol/L 4.6  3.2    Chloride 98 - 110 mmol/L 95  105    CO2 20 - 32 mmol/L 20  19    Calcium  8.6 - 10.4 mg/dL 89.8  9.5    Total Protein 6.1 - 8.1 g/dL 7.8  7.0  6.8   Total Bilirubin 0.2 - 1.2 mg/dL 0.9  0.9  0.2   Alkaline Phos 38 - 126 U/L  108  120   AST 10 - 35 U/L 22  35  22   ALT 6 - 29 U/L 30  48  16        04/01/2024    4:46 PM 02/19/2024    7:54 AM 12/04/2023   11:23 AM 10/27/2016   10:36 AM 07/11/2016    3:26 PM  Depression screen PHQ 2/9  Decreased Interest  1 0 0 1  Down, Depressed, Hopeless  0 0 0   PHQ - 2 Score  1 0 0 1  Altered sleeping       Tired, decreased energy  1     Change in appetite  1     Feeling bad or failure about yourself  Trouble concentrating  0     Moving slowly or fidgety/restless  1     Suicidal thoughts  0     PHQ-9 Score       Difficult doing work/chores  Not difficult at all        Information is confidential and restricted. Go to Review Flowsheets to unlock data.    Allergies  Allergen Reactions   Morphine  And Codeine  Nausea And Vomiting   Scopolamine Rash   Social History   Tobacco Use   Smoking status: Former    Current packs/day: 0.00    Types: Cigarettes    Quit date: 11/19/2013    Years since quitting: 10.5   Smokeless tobacco: Never  Substance Use Topics   Alcohol use: No    Alcohol/week: 0.0 standard drinks of alcohol   Past Medical History:  Diagnosis Date   Abnormal electrocardiogram 07/30/2014   Absence seizure (HCC) 12/04/2023   Acute encephalopathy 03/21/2015   Anxiety    Brain cancer (HCC)    Candidiasis of mouth 12/16/2013   Colitis 03/17/2014   GERD (gastroesophageal reflux disease)    Hyperacusia    Hypothyroidism    Left homonymous  hemianopsia    secondary to right posterior temporal and occipital lesions.   Lung cancer (HCC)    Lung nodule 11/16/2013   Metastatic melanoma- wedge resection 03/05/2014     Melanoma (HCC)    With metastasis to lung and brain   Nasal valve collapse 06/23/2021   Other headache syndrome    Pyelonephritis 12/2020   Syncope and collapse 03/31/2015   TMJ (dislocation of temporomandibular joint)    Past Surgical History:  Procedure Laterality Date   APPENDECTOMY  1979   LUMBAR WOUND DEBRIDEMENT N/A 12/05/2013   Procedure: irrigation and debridement posterior cervical wound;  Surgeon: Alm GORMAN Molt, MD;  Location: [facility];  Service: Neurosurgery;  Laterality: N/A;   SUBOCCIPITAL CRANIECTOMY CERVICAL LAMINECTOMY N/A 11/19/2013   Procedure: SUBOCCIPITAL CRANIECTOMY for tumor;  Surgeon: Alm GORMAN Molt, MD;  Location: [facility];  Service: Neurosurgery;  Laterality: N/A;   TONSILLECTOMY     VIDEO ASSISTED THORACOSCOPY (VATS)/WEDGE RESECTION Left 03/05/2014   Procedure: VIDEO ASSISTED THORACOSCOPY (VATS)/WEDGE RESECTION;  Surgeon: Elspeth JAYSON Millers, MD;  Location: [facility] OR;  Service: Thoracic;  Laterality: Left;   Family History  Problem Relation Age of Onset   Alzheimer's disease Mother    Allergies as of 05/21/2024       Reactions   Morphine  And Codeine  Nausea And Vomiting   Scopolamine Rash        Medication List        Accurate as of May 21, 2024 10:00 AM. If you have any questions, ask your nurse or doctor.          alendronate 70 MG tablet Commonly known as: FOSAMAX Take 70 mg by mouth once a week.   aspirin  EC 81 MG tablet Take 1 tablet (81 mg total) by mouth daily. Swallow whole.   D 1000 25 MCG (1000 UT) capsule Generic drug: Cholecalciferol Take 2,000 Units by mouth daily.   escitalopram  10 MG tablet Commonly known as: Lexapro  Take 1 tablet (10 mg total) by mouth daily.   gabapentin  600 MG tablet Commonly known as: NEURONTIN  Take 1.5 tablets (900 mg  total) by mouth at bedtime.   Lancets 33G Misc Use to check blood sugar 1 time(s) daily   levothyroxine  88 MCG tablet Commonly known as: SYNTHROID  Take 88 mcg by mouth.  metFORMIN  500 MG tablet Commonly known as: GLUCOPHAGE  Take 1 tablet (500 mg total) by mouth 2 (two) times daily with a meal.   Multi-Vitamin tablet Take 1 tablet by mouth daily.   ondansetron  4 MG disintegrating tablet Commonly known as: ZOFRAN -ODT Take 1 tablet (4 mg total) by mouth every 8 (eight) hours as needed for nausea or vomiting.   OneTouch Verio Flex System w/Device Kit 1 each by Does not apply route.   OneTouch Verio test strip Generic drug: glucose blood Use to check blood sugar 1 time(s) daily.   potassium chloride  10 MEQ tablet Commonly known as: KLOR-CON  Take 20 mEq by mouth daily.   promethazine  25 MG suppository Commonly known as: PHENERGAN  Place 1 suppository (25 mg total) rectally every 6 (six) hours as needed for nausea or vomiting.   rosuvastatin  5 MG tablet Commonly known as: CRESTOR  Take 1 tablet (5 mg total) by mouth daily.   Super Calcium  1500 (600 Ca) MG Tabs tablet Generic drug: calcium  carbonate 1 tablet with meals Orally Twice a day        All past medical history, surgical history, allergies, family history, immunizations and medications were updated in the EMR today and reviewed under the history and medication portions of their EMR.      ROS: Negative, with the exception of above mentioned in HPI   Objective:  BP 106/68   Pulse 72   Temp 98 F (36.7 C)   Wt 116 lb (52.6 kg)   SpO2 95%   BMI 18.17 kg/m  Body mass index is 18.17 kg/m. Physical Exam Vitals and nursing note reviewed.  Constitutional:      General: She is not in acute distress.    Appearance: Normal appearance. She is not ill-appearing, toxic-appearing or diaphoretic.     Comments: Thin  HENT:     Head: Normocephalic and atraumatic.   Eyes:     General: No scleral icterus.        Right eye: No discharge.        Left eye: No discharge.     Extraocular Movements: Extraocular movements intact.     Conjunctiva/sclera: Conjunctivae normal.     Pupils: Pupils are equal, round, and reactive to light.   Neck:     Comments: No thyromegaly Cardiovascular:     Rate and Rhythm: Normal rate and regular rhythm.  Pulmonary:     Effort: Pulmonary effort is normal. No respiratory distress.     Breath sounds: Normal breath sounds. No wheezing, rhonchi or rales.   Musculoskeletal:     Cervical back: Neck supple.     Right lower leg: No edema.     Left lower leg: No edema.  Lymphadenopathy:     Cervical: No cervical adenopathy.   Skin:    General: Skin is warm.     Findings: No rash.   Neurological:     Mental Status: She is alert and oriented to person, place, and time. Mental status is at baseline.     Motor: No weakness.     Gait: Gait normal.   Psychiatric:        Mood and Affect: Mood normal.        Behavior: Behavior normal.        Thought Content: Thought content normal.        Judgment: Judgment normal.      Assessment/Plan: LAFONDA PATRON is a 63 y.o. female present for OV for Hospital discharge follow up Nausea and vomiting,  unspecified vomiting type (Primary) - Basic Metabolic Panel (BMET) - Magnesium   Adrenal insufficiency (HCC) Managed by endocrine - Basic Metabolic Panel (BMET) - Magnesium   New onset type 2 diabetes mellitus (HCC) Started metformin  500 twice daily - Basic Metabolic Panel (BMET) - Magnesium   Hypokalemia/Hyponatremia BMP collected today  Patient appearing much better, tolerating p.o.  She has endocrine follow-up scheduled.  Reviewed expectations re: course of current medical issues. Discussed self-management of symptoms. Outlined signs and symptoms indicating need for more acute intervention. Patient verbalized understanding and all questions were answered. Patient received an After-Visit Summary. Any changes in  medications were reviewed and patient was provided with updated med list with their AVS.     Orders Placed This Encounter  Procedures   Basic Metabolic Panel (BMET)   Magnesium      Note is dictated utilizing voice recognition software. Although note has been proof read prior to signing, occasional typographical errors still can be missed. If any questions arise, please do not hesitate to call for verification.   electronically signed by:  Charlies Bellini, DO  [facility] - [place]

## 2024-05-21 NOTE — Patient Instructions (Addendum)

## 2024-05-21 NOTE — Telephone Encounter (Signed)
 Spoke with Jon from [place].

## 2024-05-21 NOTE — Telephone Encounter (Signed)
 Patient was seen today in the office for hospital follow-up.  Home health orders approved

## 2024-05-22 ENCOUNTER — Ambulatory Visit: Payer: Self-pay | Admitting: Family Medicine

## 2024-05-22 DIAGNOSIS — Z9889 Other specified postprocedural states: Secondary | ICD-10-CM | POA: Diagnosis not present

## 2024-05-22 DIAGNOSIS — C7931 Secondary malignant neoplasm of brain: Secondary | ICD-10-CM | POA: Diagnosis not present

## 2024-05-23 ENCOUNTER — Ambulatory Visit: Admitting: Family Medicine

## 2024-05-24 ENCOUNTER — Ambulatory Visit: Payer: Self-pay

## 2024-05-24 DIAGNOSIS — K828 Other specified diseases of gallbladder: Secondary | ICD-10-CM | POA: Diagnosis not present

## 2024-05-24 DIAGNOSIS — E039 Hypothyroidism, unspecified: Secondary | ICD-10-CM | POA: Diagnosis not present

## 2024-05-24 DIAGNOSIS — I351 Nonrheumatic aortic (valve) insufficiency: Secondary | ICD-10-CM | POA: Diagnosis not present

## 2024-05-24 DIAGNOSIS — R109 Unspecified abdominal pain: Secondary | ICD-10-CM | POA: Diagnosis not present

## 2024-05-24 DIAGNOSIS — Z85820 Personal history of malignant melanoma of skin: Secondary | ICD-10-CM | POA: Diagnosis not present

## 2024-05-24 DIAGNOSIS — R1084 Generalized abdominal pain: Secondary | ICD-10-CM | POA: Diagnosis not present

## 2024-05-24 DIAGNOSIS — Z85828 Personal history of other malignant neoplasm of skin: Secondary | ICD-10-CM | POA: Diagnosis not present

## 2024-05-24 DIAGNOSIS — E869 Volume depletion, unspecified: Secondary | ICD-10-CM | POA: Diagnosis not present

## 2024-05-24 DIAGNOSIS — Z85841 Personal history of malignant neoplasm of brain: Secondary | ICD-10-CM | POA: Diagnosis not present

## 2024-05-24 DIAGNOSIS — E2740 Unspecified adrenocortical insufficiency: Secondary | ICD-10-CM | POA: Diagnosis not present

## 2024-05-24 DIAGNOSIS — F1290 Cannabis use, unspecified, uncomplicated: Secondary | ICD-10-CM | POA: Diagnosis not present

## 2024-05-24 DIAGNOSIS — K529 Noninfective gastroenteritis and colitis, unspecified: Secondary | ICD-10-CM | POA: Diagnosis not present

## 2024-05-24 DIAGNOSIS — Z79890 Hormone replacement therapy (postmenopausal): Secondary | ICD-10-CM | POA: Diagnosis not present

## 2024-05-24 DIAGNOSIS — R519 Headache, unspecified: Secondary | ICD-10-CM | POA: Diagnosis not present

## 2024-05-24 DIAGNOSIS — G8929 Other chronic pain: Secondary | ICD-10-CM | POA: Diagnosis not present

## 2024-05-24 DIAGNOSIS — Z7983 Long term (current) use of bisphosphonates: Secondary | ICD-10-CM | POA: Diagnosis not present

## 2024-05-24 DIAGNOSIS — Z7952 Long term (current) use of systemic steroids: Secondary | ICD-10-CM | POA: Diagnosis not present

## 2024-05-24 DIAGNOSIS — R1111 Vomiting without nausea: Secondary | ICD-10-CM | POA: Diagnosis not present

## 2024-05-24 DIAGNOSIS — F419 Anxiety disorder, unspecified: Secondary | ICD-10-CM | POA: Diagnosis not present

## 2024-05-24 DIAGNOSIS — E119 Type 2 diabetes mellitus without complications: Secondary | ICD-10-CM | POA: Diagnosis not present

## 2024-05-24 DIAGNOSIS — F32A Depression, unspecified: Secondary | ICD-10-CM | POA: Diagnosis not present

## 2024-05-24 DIAGNOSIS — R112 Nausea with vomiting, unspecified: Secondary | ICD-10-CM | POA: Diagnosis not present

## 2024-05-24 DIAGNOSIS — E860 Dehydration: Secondary | ICD-10-CM | POA: Diagnosis not present

## 2024-05-24 DIAGNOSIS — Z9089 Acquired absence of other organs: Secondary | ICD-10-CM | POA: Diagnosis not present

## 2024-05-24 DIAGNOSIS — E785 Hyperlipidemia, unspecified: Secondary | ICD-10-CM | POA: Diagnosis not present

## 2024-05-24 DIAGNOSIS — Z85118 Personal history of other malignant neoplasm of bronchus and lung: Secondary | ICD-10-CM | POA: Diagnosis not present

## 2024-05-24 DIAGNOSIS — K449 Diaphragmatic hernia without obstruction or gangrene: Secondary | ICD-10-CM | POA: Diagnosis not present

## 2024-05-24 DIAGNOSIS — R110 Nausea: Secondary | ICD-10-CM | POA: Diagnosis not present

## 2024-05-24 DIAGNOSIS — K388 Other specified diseases of appendix: Secondary | ICD-10-CM | POA: Diagnosis not present

## 2024-05-24 DIAGNOSIS — Q6211 Congenital occlusion of ureteropelvic junction: Secondary | ICD-10-CM | POA: Diagnosis not present

## 2024-05-24 DIAGNOSIS — E1165 Type 2 diabetes mellitus with hyperglycemia: Secondary | ICD-10-CM | POA: Diagnosis not present

## 2024-05-24 DIAGNOSIS — E871 Hypo-osmolality and hyponatremia: Secondary | ICD-10-CM | POA: Diagnosis not present

## 2024-05-24 DIAGNOSIS — Z9049 Acquired absence of other specified parts of digestive tract: Secondary | ICD-10-CM | POA: Diagnosis not present

## 2024-05-24 DIAGNOSIS — T451X5A Adverse effect of antineoplastic and immunosuppressive drugs, initial encounter: Secondary | ICD-10-CM | POA: Diagnosis not present

## 2024-05-24 DIAGNOSIS — E1143 Type 2 diabetes mellitus with diabetic autonomic (poly)neuropathy: Secondary | ICD-10-CM | POA: Diagnosis not present

## 2024-05-24 DIAGNOSIS — K219 Gastro-esophageal reflux disease without esophagitis: Secondary | ICD-10-CM | POA: Diagnosis not present

## 2024-05-24 DIAGNOSIS — Z87891 Personal history of nicotine dependence: Secondary | ICD-10-CM | POA: Diagnosis not present

## 2024-05-24 DIAGNOSIS — E273 Drug-induced adrenocortical insufficiency: Secondary | ICD-10-CM | POA: Diagnosis not present

## 2024-05-24 DIAGNOSIS — Z7984 Long term (current) use of oral hypoglycemic drugs: Secondary | ICD-10-CM | POA: Diagnosis not present

## 2024-05-24 DIAGNOSIS — K3184 Gastroparesis: Secondary | ICD-10-CM | POA: Diagnosis not present

## 2024-05-24 NOTE — Telephone Encounter (Signed)
 Copied from CRM [id number]. Topic: Clinical - Red Word Triage >> May 24, 2024  9:12 AM Berneda FALCON wrote: Red Word that prompted transfer to Nurse Triage:  Lavetta (nurse) from [facility] nursing called stating Pt woke up at 2 am vomiting and was vomiting from 2-5AM today. States zofran  is not helping and patient has increased breathing and anxiety when speaking, though her vitals are within normal range (per nurse).  Nurse would like to know if provider would recommend she go back to the ER (this is what the PCP recommended last time this happened.) Reason for Disposition  Patient sounds very sick or weak to the triager  Answer Assessment - Initial Assessment Questions 1. VOMITING SEVERITY: How many times have you vomited in the past 24 hours?     - MILD:  1 - 2 times/day    - MODERATE: 3 - 5 times/day, decreased oral intake without significant weight loss or symptoms of dehydration    - SEVERE: 6 or more times/day, vomits everything or nearly everything, with significant weight loss, symptoms of dehydration      Not sure 2. ONSET: When did the vomiting begin?      230 am  3. FLUIDS: What fluids or food have you vomited up today? Have you been able to keep any fluids down?     Drinking water   4. ABDOMEN PAIN: Are your having any abdomen pain? If Yes : How bad is it and what does it feel like? (e.g., crampy, dull, intermittent, constant)      Yes, while vomiting  5. DIARRHEA: Is there any diarrhea? If Yes, ask: How many times today?      Denies  6. CONTACTS: Is there anyone else in the family with the same symptoms?      Na  7. CAUSE: What do you think is causing your vomiting?     Not sure 8. HYDRATION STATUS: Any signs of dehydration? (e.g., dry mouth [not only dry lips], too weak to stand) When did you last urinate?     weakness 9. OTHER SYMPTOMS: Do you have any other symptoms? (e.g., fever, headache, vertigo, vomiting blood or coffee grounds, recent head  injury)     Weakness, anxious.     Lavetta RN from [facility] called. BS 184. Vomiting 2am-530. Anxious. Breathing heavy. BP 122/84. HR 88. O2 98. Pain 5/10. Pt smokes weed but didn't  help with nausea. LeeAnn calling 911 to transport pt  to ED.  Protocols used: Vomiting-A-AH FYI Only or Action Required?: FYI only for provider.  Patient was last seen in primary care on 05/21/2024 by Catherine Fuller A, DO. Called Nurse Triage reporting Vomiting. Symptoms began today. Interventions attempted: Prescription medications: Zofran  . Symptoms are: gradually worsening.  Triage Disposition: Go to ED or PCP/Alternative with Approval  Patient/caregiver understands and will follow disposition?: Yes

## 2024-05-28 ENCOUNTER — Telehealth: Payer: Self-pay

## 2024-05-28 DIAGNOSIS — E2740 Unspecified adrenocortical insufficiency: Secondary | ICD-10-CM | POA: Diagnosis not present

## 2024-05-28 DIAGNOSIS — F32A Depression, unspecified: Secondary | ICD-10-CM | POA: Diagnosis not present

## 2024-05-28 DIAGNOSIS — Z7984 Long term (current) use of oral hypoglycemic drugs: Secondary | ICD-10-CM | POA: Diagnosis not present

## 2024-05-28 DIAGNOSIS — Z85118 Personal history of other malignant neoplasm of bronchus and lung: Secondary | ICD-10-CM | POA: Diagnosis not present

## 2024-05-28 DIAGNOSIS — F419 Anxiety disorder, unspecified: Secondary | ICD-10-CM | POA: Diagnosis not present

## 2024-05-28 DIAGNOSIS — Z85828 Personal history of other malignant neoplasm of skin: Secondary | ICD-10-CM | POA: Diagnosis not present

## 2024-05-28 DIAGNOSIS — Z7983 Long term (current) use of bisphosphonates: Secondary | ICD-10-CM | POA: Diagnosis not present

## 2024-05-28 DIAGNOSIS — Z7952 Long term (current) use of systemic steroids: Secondary | ICD-10-CM | POA: Diagnosis not present

## 2024-05-28 DIAGNOSIS — I351 Nonrheumatic aortic (valve) insufficiency: Secondary | ICD-10-CM | POA: Diagnosis not present

## 2024-05-28 DIAGNOSIS — E119 Type 2 diabetes mellitus without complications: Secondary | ICD-10-CM | POA: Diagnosis not present

## 2024-05-28 DIAGNOSIS — Z9089 Acquired absence of other organs: Secondary | ICD-10-CM | POA: Diagnosis not present

## 2024-05-28 DIAGNOSIS — E039 Hypothyroidism, unspecified: Secondary | ICD-10-CM | POA: Diagnosis not present

## 2024-05-28 DIAGNOSIS — M810 Age-related osteoporosis without current pathological fracture: Secondary | ICD-10-CM | POA: Diagnosis not present

## 2024-05-28 DIAGNOSIS — Z85841 Personal history of malignant neoplasm of brain: Secondary | ICD-10-CM | POA: Diagnosis not present

## 2024-05-28 DIAGNOSIS — Z87891 Personal history of nicotine dependence: Secondary | ICD-10-CM | POA: Diagnosis not present

## 2024-05-28 DIAGNOSIS — K219 Gastro-esophageal reflux disease without esophagitis: Secondary | ICD-10-CM | POA: Diagnosis not present

## 2024-05-28 LAB — HM DEXA SCAN

## 2024-05-28 NOTE — Telephone Encounter (Signed)
 [facility] faxed x2 forms to be signed by provider.  [facility] inbox front office  Order # [id number]  Order # [id number]

## 2024-05-29 DIAGNOSIS — I351 Nonrheumatic aortic (valve) insufficiency: Secondary | ICD-10-CM | POA: Diagnosis not present

## 2024-05-29 DIAGNOSIS — E2740 Unspecified adrenocortical insufficiency: Secondary | ICD-10-CM | POA: Diagnosis not present

## 2024-05-29 DIAGNOSIS — Z9089 Acquired absence of other organs: Secondary | ICD-10-CM | POA: Diagnosis not present

## 2024-05-29 DIAGNOSIS — Z7983 Long term (current) use of bisphosphonates: Secondary | ICD-10-CM | POA: Diagnosis not present

## 2024-05-29 DIAGNOSIS — F32A Depression, unspecified: Secondary | ICD-10-CM | POA: Diagnosis not present

## 2024-05-29 DIAGNOSIS — Z7984 Long term (current) use of oral hypoglycemic drugs: Secondary | ICD-10-CM | POA: Diagnosis not present

## 2024-05-29 DIAGNOSIS — Z7952 Long term (current) use of systemic steroids: Secondary | ICD-10-CM | POA: Diagnosis not present

## 2024-05-29 DIAGNOSIS — E119 Type 2 diabetes mellitus without complications: Secondary | ICD-10-CM | POA: Diagnosis not present

## 2024-05-29 DIAGNOSIS — E039 Hypothyroidism, unspecified: Secondary | ICD-10-CM | POA: Diagnosis not present

## 2024-05-29 DIAGNOSIS — Z87891 Personal history of nicotine dependence: Secondary | ICD-10-CM | POA: Diagnosis not present

## 2024-05-29 DIAGNOSIS — K219 Gastro-esophageal reflux disease without esophagitis: Secondary | ICD-10-CM | POA: Diagnosis not present

## 2024-05-29 DIAGNOSIS — F419 Anxiety disorder, unspecified: Secondary | ICD-10-CM | POA: Diagnosis not present

## 2024-05-29 NOTE — Telephone Encounter (Signed)
 Home health orders received 05/29/2024 for Heron DELENA [facility] initiation orders: Yes.  Home health re-certification orders: No. Patient last seen by ordering physician for this condition: yes. Must be less than 90 days for re-certification and less than 30 days prior for initiation. Visit must have been for the condition the orders are being placed.  Patient meets criteria for Physician to sign orders: Yes.        Current med list has been attached: No     Charlies Bellini   Completed and placed on CMA work basket

## 2024-05-29 NOTE — Telephone Encounter (Signed)
 Orders faxed

## 2024-06-03 DIAGNOSIS — F419 Anxiety disorder, unspecified: Secondary | ICD-10-CM | POA: Diagnosis not present

## 2024-06-03 DIAGNOSIS — E039 Hypothyroidism, unspecified: Secondary | ICD-10-CM | POA: Diagnosis not present

## 2024-06-03 DIAGNOSIS — Z85828 Personal history of other malignant neoplasm of skin: Secondary | ICD-10-CM | POA: Diagnosis not present

## 2024-06-03 DIAGNOSIS — Z7984 Long term (current) use of oral hypoglycemic drugs: Secondary | ICD-10-CM | POA: Diagnosis not present

## 2024-06-03 DIAGNOSIS — Z9089 Acquired absence of other organs: Secondary | ICD-10-CM | POA: Diagnosis not present

## 2024-06-03 DIAGNOSIS — Z85118 Personal history of other malignant neoplasm of bronchus and lung: Secondary | ICD-10-CM | POA: Diagnosis not present

## 2024-06-03 DIAGNOSIS — Z87891 Personal history of nicotine dependence: Secondary | ICD-10-CM | POA: Diagnosis not present

## 2024-06-03 DIAGNOSIS — Z7952 Long term (current) use of systemic steroids: Secondary | ICD-10-CM | POA: Diagnosis not present

## 2024-06-03 DIAGNOSIS — K219 Gastro-esophageal reflux disease without esophagitis: Secondary | ICD-10-CM | POA: Diagnosis not present

## 2024-06-03 DIAGNOSIS — Z85841 Personal history of malignant neoplasm of brain: Secondary | ICD-10-CM | POA: Diagnosis not present

## 2024-06-03 DIAGNOSIS — F32A Depression, unspecified: Secondary | ICD-10-CM | POA: Diagnosis not present

## 2024-06-03 DIAGNOSIS — I351 Nonrheumatic aortic (valve) insufficiency: Secondary | ICD-10-CM | POA: Diagnosis not present

## 2024-06-03 DIAGNOSIS — Z7983 Long term (current) use of bisphosphonates: Secondary | ICD-10-CM | POA: Diagnosis not present

## 2024-06-03 DIAGNOSIS — E119 Type 2 diabetes mellitus without complications: Secondary | ICD-10-CM | POA: Diagnosis not present

## 2024-06-03 DIAGNOSIS — E2740 Unspecified adrenocortical insufficiency: Secondary | ICD-10-CM | POA: Diagnosis not present

## 2024-06-04 ENCOUNTER — Telehealth: Payer: Self-pay

## 2024-06-04 NOTE — Telephone Encounter (Signed)
 Copied from CRM [id number]. Topic: Clinical - Home Health Verbal Orders >> Jun 04, 2024 11:04 AM Suzen RAMAN wrote: Caller/Agency: Gracie/ [facility] Callback Number: [phone] Service Requested: Physical Therapy Frequency: 2x week for 1 week 1x week for 6 weeks Any new concerns about the patient? No

## 2024-06-04 NOTE — Telephone Encounter (Signed)
**Note De-identified  [place] Obfuscation** Please advise 

## 2024-06-05 DIAGNOSIS — R112 Nausea with vomiting, unspecified: Secondary | ICD-10-CM | POA: Diagnosis not present

## 2024-06-05 DIAGNOSIS — Z1211 Encounter for screening for malignant neoplasm of colon: Secondary | ICD-10-CM | POA: Diagnosis not present

## 2024-06-05 NOTE — Telephone Encounter (Signed)
**Note De-identified  [place] Obfuscation** Please advise 

## 2024-06-05 NOTE — Telephone Encounter (Signed)
 Copied from CRM [id number]. Topic: Clinical - Home Health Verbal Orders >> Jun 05, 2024  8:53 AM Deaijah H wrote: Caller/Agency: Courtney Nurse w/ [facility] Callback Number: [phone] Service Requested: Skilled Nursing Frequency: 1x a wk for 4wks Any new concerns about the patient? No

## 2024-06-06 NOTE — Telephone Encounter (Signed)
Placed in PCP office.

## 2024-06-06 NOTE — Telephone Encounter (Signed)
 [facility] faxed x1 form to be signed by provider.  [facility] inbox front office  Order #  [id number]  Note Date 06/05/24   [facility] requesting verbal orders for chronic disease assessment & education  [phone]

## 2024-06-07 ENCOUNTER — Other Ambulatory Visit: Payer: Self-pay | Admitting: Family Medicine

## 2024-06-07 NOTE — Telephone Encounter (Signed)
 Caller/Agency: Gracee from [facility]    Callback Number: [phone]    Service Requested: Physical Therapy    Frequency: 1 time a week for 7 weeks    Any new concerns about the patient? No

## 2024-06-10 NOTE — Telephone Encounter (Signed)
 Called Gracee but unable to leave VM

## 2024-06-10 NOTE — Telephone Encounter (Signed)
 approved

## 2024-06-14 ENCOUNTER — Telehealth: Payer: Self-pay

## 2024-06-14 NOTE — Telephone Encounter (Signed)
 [facility] faxed 1 form to be signed by provider.  [facility] inbox front office   Order [phone]

## 2024-06-17 NOTE — Telephone Encounter (Signed)
 Forms faxed

## 2024-06-17 NOTE — Telephone Encounter (Signed)
 Completed resumption of care from health orders for patient and placed in CMA work basket

## 2024-06-17 NOTE — Telephone Encounter (Signed)
 In PCP office for review & signature.

## 2024-06-18 DIAGNOSIS — E785 Hyperlipidemia, unspecified: Secondary | ICD-10-CM | POA: Diagnosis not present

## 2024-06-18 DIAGNOSIS — C439 Malignant melanoma of skin, unspecified: Secondary | ICD-10-CM | POA: Diagnosis not present

## 2024-06-18 DIAGNOSIS — K219 Gastro-esophageal reflux disease without esophagitis: Secondary | ICD-10-CM | POA: Diagnosis not present

## 2024-06-18 DIAGNOSIS — E2740 Unspecified adrenocortical insufficiency: Secondary | ICD-10-CM | POA: Diagnosis not present

## 2024-06-18 DIAGNOSIS — F419 Anxiety disorder, unspecified: Secondary | ICD-10-CM | POA: Diagnosis not present

## 2024-06-18 DIAGNOSIS — Z7983 Long term (current) use of bisphosphonates: Secondary | ICD-10-CM | POA: Diagnosis not present

## 2024-06-18 DIAGNOSIS — Z7984 Long term (current) use of oral hypoglycemic drugs: Secondary | ICD-10-CM | POA: Diagnosis not present

## 2024-06-18 DIAGNOSIS — E871 Hypo-osmolality and hyponatremia: Secondary | ICD-10-CM | POA: Diagnosis not present

## 2024-06-18 DIAGNOSIS — Z7952 Long term (current) use of systemic steroids: Secondary | ICD-10-CM | POA: Diagnosis not present

## 2024-06-18 DIAGNOSIS — I351 Nonrheumatic aortic (valve) insufficiency: Secondary | ICD-10-CM | POA: Diagnosis not present

## 2024-06-18 DIAGNOSIS — E119 Type 2 diabetes mellitus without complications: Secondary | ICD-10-CM | POA: Diagnosis not present

## 2024-06-18 DIAGNOSIS — Z85828 Personal history of other malignant neoplasm of skin: Secondary | ICD-10-CM | POA: Diagnosis not present

## 2024-06-18 DIAGNOSIS — F32A Depression, unspecified: Secondary | ICD-10-CM | POA: Diagnosis not present

## 2024-06-18 DIAGNOSIS — E039 Hypothyroidism, unspecified: Secondary | ICD-10-CM | POA: Diagnosis not present

## 2024-06-18 DIAGNOSIS — Z87891 Personal history of nicotine dependence: Secondary | ICD-10-CM | POA: Diagnosis not present

## 2024-06-18 DIAGNOSIS — Z9089 Acquired absence of other organs: Secondary | ICD-10-CM | POA: Diagnosis not present

## 2024-06-20 DIAGNOSIS — E063 Autoimmune thyroiditis: Secondary | ICD-10-CM | POA: Diagnosis not present

## 2024-06-27 DIAGNOSIS — M810 Age-related osteoporosis without current pathological fracture: Secondary | ICD-10-CM | POA: Diagnosis not present

## 2024-06-27 DIAGNOSIS — E063 Autoimmune thyroiditis: Secondary | ICD-10-CM | POA: Diagnosis not present

## 2024-06-27 DIAGNOSIS — Z1330 Encounter for screening examination for mental health and behavioral disorders, unspecified: Secondary | ICD-10-CM | POA: Diagnosis not present

## 2024-06-27 DIAGNOSIS — E273 Drug-induced adrenocortical insufficiency: Secondary | ICD-10-CM | POA: Diagnosis not present

## 2024-06-27 DIAGNOSIS — E119 Type 2 diabetes mellitus without complications: Secondary | ICD-10-CM | POA: Diagnosis not present

## 2024-07-08 ENCOUNTER — Other Ambulatory Visit: Payer: Self-pay | Admitting: Family Medicine

## 2024-07-08 ENCOUNTER — Ambulatory Visit: Admitting: "Endocrinology

## 2024-07-12 ENCOUNTER — Encounter: Payer: Self-pay | Admitting: Family Medicine

## 2024-07-12 ENCOUNTER — Ambulatory Visit: Admitting: Family Medicine

## 2024-07-12 VITALS — BP 106/70 | HR 77 | Temp 98.1°F | Ht 67.0 in | Wt 111.0 lb

## 2024-07-12 DIAGNOSIS — R519 Headache, unspecified: Secondary | ICD-10-CM

## 2024-07-12 DIAGNOSIS — M818 Other osteoporosis without current pathological fracture: Secondary | ICD-10-CM

## 2024-07-12 DIAGNOSIS — F419 Anxiety disorder, unspecified: Secondary | ICD-10-CM

## 2024-07-12 DIAGNOSIS — Z7984 Long term (current) use of oral hypoglycemic drugs: Secondary | ICD-10-CM

## 2024-07-12 DIAGNOSIS — F4321 Adjustment disorder with depressed mood: Secondary | ICD-10-CM

## 2024-07-12 DIAGNOSIS — R931 Abnormal findings on diagnostic imaging of heart and coronary circulation: Secondary | ICD-10-CM | POA: Diagnosis not present

## 2024-07-12 DIAGNOSIS — Z23 Encounter for immunization: Secondary | ICD-10-CM

## 2024-07-12 DIAGNOSIS — Z0000 Encounter for general adult medical examination without abnormal findings: Secondary | ICD-10-CM | POA: Diagnosis not present

## 2024-07-12 DIAGNOSIS — E063 Autoimmune thyroiditis: Secondary | ICD-10-CM | POA: Diagnosis not present

## 2024-07-12 DIAGNOSIS — Z1211 Encounter for screening for malignant neoplasm of colon: Secondary | ICD-10-CM

## 2024-07-12 DIAGNOSIS — I700 Atherosclerosis of aorta: Secondary | ICD-10-CM | POA: Diagnosis not present

## 2024-07-12 DIAGNOSIS — Z9089 Acquired absence of other organs: Secondary | ICD-10-CM | POA: Diagnosis not present

## 2024-07-12 DIAGNOSIS — E2740 Unspecified adrenocortical insufficiency: Secondary | ICD-10-CM

## 2024-07-12 DIAGNOSIS — Z7952 Long term (current) use of systemic steroids: Secondary | ICD-10-CM | POA: Diagnosis not present

## 2024-07-12 DIAGNOSIS — Z87891 Personal history of nicotine dependence: Secondary | ICD-10-CM | POA: Diagnosis not present

## 2024-07-12 DIAGNOSIS — Z7983 Long term (current) use of bisphosphonates: Secondary | ICD-10-CM | POA: Diagnosis not present

## 2024-07-12 DIAGNOSIS — C439 Malignant melanoma of skin, unspecified: Secondary | ICD-10-CM | POA: Diagnosis not present

## 2024-07-12 DIAGNOSIS — C7931 Secondary malignant neoplasm of brain: Secondary | ICD-10-CM | POA: Diagnosis not present

## 2024-07-12 DIAGNOSIS — Z85828 Personal history of other malignant neoplasm of skin: Secondary | ICD-10-CM | POA: Diagnosis not present

## 2024-07-12 DIAGNOSIS — F32A Depression, unspecified: Secondary | ICD-10-CM | POA: Diagnosis not present

## 2024-07-12 DIAGNOSIS — K219 Gastro-esophageal reflux disease without esophagitis: Secondary | ICD-10-CM | POA: Diagnosis not present

## 2024-07-12 DIAGNOSIS — I351 Nonrheumatic aortic (valve) insufficiency: Secondary | ICD-10-CM | POA: Diagnosis not present

## 2024-07-12 DIAGNOSIS — E119 Type 2 diabetes mellitus without complications: Secondary | ICD-10-CM

## 2024-07-12 DIAGNOSIS — Z1231 Encounter for screening mammogram for malignant neoplasm of breast: Secondary | ICD-10-CM

## 2024-07-12 DIAGNOSIS — E871 Hypo-osmolality and hyponatremia: Secondary | ICD-10-CM | POA: Diagnosis not present

## 2024-07-12 DIAGNOSIS — C7800 Secondary malignant neoplasm of unspecified lung: Secondary | ICD-10-CM

## 2024-07-12 DIAGNOSIS — E785 Hyperlipidemia, unspecified: Secondary | ICD-10-CM | POA: Diagnosis not present

## 2024-07-12 DIAGNOSIS — E039 Hypothyroidism, unspecified: Secondary | ICD-10-CM | POA: Diagnosis not present

## 2024-07-12 LAB — CBC
HCT: 43 % (ref 36.0–46.0)
Hemoglobin: 14.1 g/dL (ref 12.0–15.0)
MCHC: 32.7 g/dL (ref 30.0–36.0)
MCV: 95.7 fl (ref 78.0–100.0)
Platelets: 304 K/uL (ref 150.0–400.0)
RBC: 4.49 Mil/uL (ref 3.87–5.11)
RDW: 13.9 % (ref 11.5–15.5)
WBC: 9.2 K/uL (ref 4.0–10.5)

## 2024-07-12 LAB — COMPREHENSIVE METABOLIC PANEL WITH GFR
ALT: 18 U/L (ref 0–35)
AST: 18 U/L (ref 0–37)
Albumin: 3.9 g/dL (ref 3.5–5.2)
Alkaline Phosphatase: 82 U/L (ref 39–117)
BUN: 14 mg/dL (ref 6–23)
CO2: 26 meq/L (ref 19–32)
Calcium: 9.3 mg/dL (ref 8.4–10.5)
Chloride: 105 meq/L (ref 96–112)
Creatinine, Ser: 0.64 mg/dL (ref 0.40–1.20)
GFR: 94.07 mL/min (ref >60.00)
Glucose, Bld: 239 mg/dL — ABNORMAL HIGH (ref 70–99)
Potassium: 3.6 meq/L (ref 3.5–5.1)
Sodium: 140 meq/L (ref 135–145)
Total Bilirubin: 0.3 mg/dL (ref 0.2–1.2)
Total Protein: 6.1 g/dL (ref 6.0–8.3)

## 2024-07-12 LAB — LIPID PANEL
Cholesterol: 110 mg/dL (ref 0–200)
HDL: 41.5 mg/dL (ref >39.00)
LDL Cholesterol: 24 mg/dL (ref 0–99)
NonHDL: 68.13
Total CHOL/HDL Ratio: 3
Triglycerides: 221 mg/dL — ABNORMAL HIGH (ref 0.0–149.0)
VLDL: 44.2 mg/dL — ABNORMAL HIGH (ref 0.0–40.0)

## 2024-07-12 LAB — HEMOGLOBIN A1C: Hgb A1c MFr Bld: 7.8 % — ABNORMAL HIGH (ref 4.6–6.5)

## 2024-07-12 LAB — MICROALBUMIN / CREATININE URINE RATIO
Creatinine,U: 58.9 mg/dL
Microalb Creat Ratio: UNDETERMINED mg/g (ref 0.0–30.0)
Microalb, Ur: <0.7 mg/dL

## 2024-07-12 LAB — VITAMIN D 25 HYDROXY (VIT D DEFICIENCY, FRACTURES): VITD: 60.33 ng/mL (ref 30.00–100.00)

## 2024-07-12 MED ORDER — GABAPENTIN 600 MG PO TABS
900.0000 mg | ORAL_TABLET | Freq: Every day | ORAL | 1 refills | Status: AC
Start: 2024-07-12 — End: ?

## 2024-07-12 MED ORDER — ESCITALOPRAM OXALATE 10 MG PO TABS: 10.0000 mg | ORAL_TABLET | Freq: Every day | ORAL | 1 refills | Status: AC

## 2024-07-12 MED ORDER — ROSUVASTATIN CALCIUM 5 MG PO TABS: 5.0000 mg | ORAL_TABLET | Freq: Every day | ORAL | 3 refills | Status: AC

## 2024-07-12 MED ORDER — ZOSTER VAC RECOMB ADJUVANTED 50 MCG/0.5ML IM SUSR: 0.5000 mL | Freq: Once | INTRAMUSCULAR | 1 refills | Status: AC

## 2024-07-12 MED ORDER — METFORMIN HCL 500 MG PO TABS: 500.0000 mg | ORAL_TABLET | Freq: Two times a day (BID) | ORAL | 1 refills | Status: AC

## 2024-07-12 NOTE — Patient Instructions (Addendum)
 Return in about 24 weeks (around 12/27/2024) for Routine chronic condition follow-up.  Mammogram bus Aug. 22, 2025 at our parking lot at 10:20.         Great to see you today.  I have refilled the medication(s) we provide.   If labs were collected or images ordered, we will inform you of  results once we have received them and reviewed. We will contact you either by echart message, or telephone call.  Please give ample time to the testing facility, and our office to run,  receive and review results. Please do not call inquiring of results, even if you can see them in your chart. We will contact you as soon as we are able. If it has been over 1 week since the test was completed, and you have not yet heard from us , then please call us .    - echart message- for normal results that have been seen by the patient already.   - telephone call: abnormal results or if patient has not viewed results in their echart.  If a referral to a specialist was entered for you, please call us  in 2 weeks if you have not heard from the specialist office to schedule.

## 2024-07-12 NOTE — Progress Notes (Signed)
 Patient ID: [name] [name], female  DOB: 11-16-1961, 63 y.o.   MRN: [id number] Patient Care Team    Relationship Specialty Notifications Start End  Catherine Charlies DELENA, DO PCP - General Family Medicine  12/04/23   Edmund Mose Koyanagi, MD Referring Physician Radiation Oncology  12/04/23   Jeffrie Oneil BROCKS, MD Consulting Physician Cardiology  12/04/23   Winthrop Earing, MD Referring Physician Internal Medicine  12/04/23    Comment: oncologist for Melanoma  [name]    12/04/23   Beryl Donnice BRAVO, MD Referring Physician Endocrinology  07/12/24   Jefrey Bruckner, MD Referring Physician Gastroenterology  07/12/24     Chief Complaint  Patient presents with   Annual Exam    combined chronic condition appointment.  Pt is not fasting.      Subjective:  [name] [name] is a 63 y.o.  female present for CPE and combined chronic condition appointment  All past medical history, surgical history, allergies, family history, immunizations, medications and social history were updated in the electronic medical record today. All recent labs, ED visits and hospitalizations within the last year were reviewed. No records were made available prior to patient's visit.  Health maintenance:  Colon cancer screening: Completed 11/18/2014 established with digestive health plan EGD and colonoscopy 06/2024 Breast cancer screening:will schedule at [facility] bus next week Cervical cancer screening:declined Immunizations: Tdap UTD 06/03/2015, influenza vaccine encouraged yearly, Shingrix-printed, Prevnar 20 completed Infectious disease screening: HIV and hepatitis C screenings completed DEXA: Completed 05/28/2024.  Osteoporosis T-score -3.0, ordered by endocrinology Dr. Beryl   type 2 diabetes mellitus (HCC) Pt reports compliance with metformin  500 mg BID. Denies numbness, tingling of extremities, hypo/hyperglycemic events or non-healing wounds.    Malignant melanoma metastatic to brain [facility])  (Primary)/melanoma metastatic to lung Patient is established with oncologist for management.  Grief/anxiety: She reports she is compliant with lexapro  10 mg and feels it is working well.   Prior note: Patient reports she is currently under a lot of stress and is grieving the loss of her husband.  Her husband passed away unexpectedly 2024/11/22after being diagnosed with cancer.  She states after diagnosis and only lived for a few weeks.  She and her husband have been together since they were teenagers.  She is feeling lonely.  She has never been on medications to help with anxiety or depression in the past.  She states she is always been like the person that is high strung.  She is having difficulty adjusting to a life on her own, but states she is going to continue to try to adapt.  Chronic daily headache Patient reports she had chronic headaches for many years.  These headaches are even prior to her melanoma metastasis to her brain.  She was under the care with a headache specialist and prescribed gabapentin  900 mg nightly, along with a breakthrough gabapentin  100 mg daily.  Per patient request medication management taken over by this provider.  Patient reports compliance of medication regimen is working well for her.  Adrenal insufficiency (HCC)/hypothyroidism Patient is established and follows closely with endocrinology who prescribes her levothyroxine  for her hypothyroidism and the hydrocortisone  for her adrenal insufficiency.  Absence seizure (HCC) This seems to be potentially new diagnosis for patient.  She has an establishment visit already put in place with [facility] on 01/09/2024 to address concerns.  No prior records available.  Mixed hyperlipidemia/aortic atherosclerosis Patient is established with cardiology.   Cardiology prescribes patient's  Crestor  5 mg daily.   Patient takes a baby aspirin  daily.       07/12/2024    7:41 AM 04/01/2024    4:46 PM  02/19/2024    7:54 AM 12/04/2023   11:23 AM 10/27/2016   10:36 AM  Depression screen PHQ 2/9  Decreased Interest 0  1 0 0  Down, Depressed, Hopeless 1  0 0 0  PHQ - 2 Score 1  1 0 0  Altered sleeping 0      Tired, decreased energy 1  1    Change in appetite 0  1    Feeling bad or failure about yourself  0      Trouble concentrating 0  0    Moving slowly or fidgety/restless 0  1    Suicidal thoughts 0  0    PHQ-9 Score 2      Difficult doing work/chores Not difficult at all  Not difficult at all       Information is confidential and restricted. Go to Review Flowsheets to unlock data.      07/12/2024    7:41 AM 02/19/2024    7:55 AM  GAD 7 : Generalized Anxiety Score  Nervous, Anxious, on Edge 1 1  Control/stop worrying 0 1  Worry too much - different things 0 0  Trouble relaxing 0 0  Restless 0 1  Easily annoyed or irritable 0 1  Afraid - awful might happen 0 1  Total GAD 7 Score 1 5  Anxiety Difficulty Not difficult at all Not difficult at all             07/12/2024    7:41 AM 02/19/2024    7:54 AM 12/04/2023   11:23 AM 08/06/2018   10:24 AM 08/04/2017   11:30 AM  Fall Risk   Falls in the past year? 1 1 0 No  No   Number falls in past yr: 0 0 0    Injury with Fall? 0 0 0    Risk for fall due to :   No Fall Risks    Follow up Falls evaluation completed Falls evaluation completed Falls evaluation completed       Data saved with a previous flowsheet row definition     Immunization History  Administered Date(s) Administered   Influenza, Seasonal, Injecte, Preservative Fre 09/01/2014, 12/04/2023   Influenza,inj,Quad PF,6+ Mos 12/06/2013, 08/01/2018, 07/31/2019   Influenza-Unspecified 08/29/2016, 08/10/2020, 08/17/2022   PFIZER Comirnaty(Gray Top)Covid-19 Tri-Sucrose Vaccine 02/14/2020, 03/06/2020, 07/19/2021, 09/27/2021   PFIZER(Purple Top)SARS-COV-2 Vaccination 09/03/2020, 02/01/2021   PNEUMOCOCCAL CONJUGATE-20 02/19/2024   Pfizer(Comirnaty)Fall Seasonal Vaccine 12  years and older 04/12/2022, 08/17/2022   Pneumococcal Polysaccharide-23 11/16/2013   Tdap 06/03/2015    No results found.  Past Medical History:  Diagnosis Date   Abnormal electrocardiogram 07/30/2014   Absence seizure (HCC) 12/04/2023   Acute encephalopathy 03/21/2015   Anxiety    Brain cancer (HCC)    Candidiasis of mouth 12/16/2013   Colitis 03/17/2014   GERD (gastroesophageal reflux disease)    Hyperacusia    Hypokalemia 05/10/2024   Hyponatremia 05/14/2024   Hypothyroidism    Left homonymous hemianopsia    secondary to right posterior temporal and occipital lesions.   Lung cancer (HCC)    Lung nodule 11/16/2013   Metastatic melanoma- wedge resection 03/05/2014     Melanoma (HCC)    With metastasis to lung and brain   Nasal valve collapse 06/23/2021   Other headache syndrome    Pyelonephritis 12/2020  S/P craniotomy 11/19/2013   Syncope and collapse 03/31/2015   TMJ (dislocation of temporomandibular joint)    Allergies  Allergen Reactions   Morphine  And Codeine  Nausea And Vomiting   Scopolamine Rash   Past Surgical History:  Procedure Laterality Date   APPENDECTOMY  1979   LUMBAR WOUND DEBRIDEMENT N/A 12/05/2013   Procedure: irrigation and debridement posterior cervical wound;  Surgeon: Alm GORMAN Molt, MD;  Location: [facility];  Service: Neurosurgery;  Laterality: N/A;   SUBOCCIPITAL CRANIECTOMY CERVICAL LAMINECTOMY N/A 11/19/2013   Procedure: SUBOCCIPITAL CRANIECTOMY for tumor;  Surgeon: Alm GORMAN Molt, MD;  Location: [facility];  Service: Neurosurgery;  Laterality: N/A;   TONSILLECTOMY     VIDEO ASSISTED THORACOSCOPY (VATS)/WEDGE RESECTION Left 03/05/2014   Procedure: VIDEO ASSISTED THORACOSCOPY (VATS)/WEDGE RESECTION;  Surgeon: Elspeth JAYSON Millers, MD;  Location: [facility] OR;  Service: Thoracic;  Laterality: Left;   Family History  Problem Relation Age of Onset   Alzheimer's disease Mother    Social History   Social History Narrative   Marital  status/children/pets: Widowed.  No children.   Education/employment: 2 years of college, retired/disabled   Right handed       Allergies as of 07/12/2024       Reactions   Morphine  And Codeine  Nausea And Vomiting   Scopolamine Rash        Medication List        Accurate as of July 12, 2024  8:12 AM. If you have any questions, ask your nurse or doctor.          alendronate 70 MG tablet Commonly known as: FOSAMAX Take 70 mg by mouth once a week.   aspirin  EC 81 MG tablet Take 1 tablet (81 mg total) by mouth daily. Swallow whole.   D 1000 25 MCG (1000 UT) capsule Generic drug: Cholecalciferol Take 2,000 Units by mouth daily.   doxylamine (Sleep) 25 MG tablet Commonly known as: UNISOM Take 25 mg by mouth at bedtime.   escitalopram  10 MG tablet Commonly known as: Lexapro  Take 1 tablet (10 mg total) by mouth daily.   gabapentin  600 MG tablet Commonly known as: NEURONTIN  Take 1.5 tablets (900 mg total) by mouth at bedtime.   hydrocortisone  5 MG tablet Commonly known as: CORTEF  Take by mouth.   hydrocortisone  sodium succinate 100 MG injection Commonly known as: SOLU-CORTEF  Inject 100 mg into the muscle.   Lancets 33G Misc Use to check blood sugar 1 time(s) daily   levothyroxine  100 MCG tablet Commonly known as: SYNTHROID  Take 100 mcg by mouth.   metFORMIN  500 MG tablet Commonly known as: GLUCOPHAGE  Take 1 tablet (500 mg total) by mouth 2 (two) times daily with a meal.   Multi-Vitamin tablet Take 1 tablet by mouth daily.   ondansetron  4 MG disintegrating tablet Commonly known as: ZOFRAN -ODT Take 1 tablet (4 mg total) by mouth every 8 (eight) hours as needed for nausea or vomiting.   OneTouch Verio Flex System w/Device Kit 1 each by Does not apply route.   OneTouch Verio test strip Generic drug: glucose blood Use to check blood sugar 1 time(s) daily.   potassium chloride  10 MEQ tablet Commonly known as: KLOR-CON  Take 20 mEq by mouth daily.    promethazine  25 MG suppository Commonly known as: PHENERGAN  Place 1 suppository (25 mg total) rectally every 6 (six) hours as needed for nausea or vomiting.   rosuvastatin  5 MG tablet Commonly known as: CRESTOR  Take 1 tablet (5 mg total) by mouth daily.  Super Calcium  1500 (600 Ca) MG Tabs tablet Generic drug: calcium  carbonate 1 tablet with meals Orally Twice a day   Zoster Vaccine Adjuvanted injection Commonly known as: SHINGRIX Inject 0.5 mLs into the muscle once for 1 dose. Rpt injection once in 2-6 months Started by: Charlies Bellini        All past medical history, surgical history, allergies, family history, immunizations andmedications were updated in the EMR today and reviewed under the history and medication portions of their EMR.      CT CORONARY MORPH W/CTA COR W/SCORE W/CA W/CM &/OR WO/CM Result Date: 10/24/2022 IMPRESSION:  1. Minimal mixed non-obstructive CAD, CADRADS = 1.  2. Coronary calcium  score of 98. This was 88th percentile for age and sex matched control.  3. Normal coronary origin with right dominance.     ROS 14 pt review of systems performed and negative (unless mentioned in an HPI)  Objective: BP 106/70   Pulse 77   Temp 98.1 F (36.7 C)   Ht 5' 7 (1.702 m)   Wt 111 lb (50.3 kg)   SpO2 95%   BMI 17.39 kg/m  Physical Exam Vitals and nursing note reviewed.  Constitutional:      General: She is not in acute distress.    Appearance: Normal appearance. She is not ill-appearing or toxic-appearing.  HENT:     Head: Normocephalic and atraumatic.     Right Ear: Tympanic membrane, ear canal and external ear normal. There is no impacted cerumen.     Left Ear: Tympanic membrane, ear canal and external ear normal. There is no impacted cerumen.     Nose: No congestion or rhinorrhea.     Mouth/Throat:     Mouth: Mucous membranes are moist.     Pharynx: Oropharynx is clear. No oropharyngeal exudate or posterior oropharyngeal erythema.  Eyes:      General: No scleral icterus.       Right eye: No discharge.        Left eye: No discharge.     Extraocular Movements: Extraocular movements intact.     Conjunctiva/sclera: Conjunctivae normal.     Pupils: Pupils are equal, round, and reactive to light.  Cardiovascular:     Rate and Rhythm: Normal rate and regular rhythm.     Pulses: Normal pulses.     Heart sounds: Normal heart sounds. No murmur heard.    No friction rub. No gallop.  Pulmonary:     Effort: Pulmonary effort is normal. No respiratory distress.     Breath sounds: Normal breath sounds. No stridor. No wheezing, rhonchi or rales.  Chest:     Chest wall: No tenderness.  Abdominal:     General: Abdomen is flat. Bowel sounds are normal. There is no distension.     Palpations: Abdomen is soft. There is no mass.     Tenderness: There is no abdominal tenderness. There is no right CVA tenderness, left CVA tenderness, guarding or rebound.     Hernia: No hernia is present.  Musculoskeletal:        General: No swelling, tenderness or deformity. Normal range of motion.     Cervical back: Normal range of motion and neck supple. No rigidity or tenderness.     Right lower leg: No edema.     Left lower leg: No edema.  Lymphadenopathy:     Cervical: No cervical adenopathy.  Skin:    General: Skin is warm and dry.     Coloration: Skin is not  jaundiced or pale.     Findings: No bruising, erythema, lesion or rash.  Neurological:     General: No focal deficit present.     Mental Status: She is alert and oriented to person, place, and time. Mental status is at baseline.     Cranial Nerves: No cranial nerve deficit.     Sensory: No sensory deficit.     Motor: No weakness.     Coordination: Coordination normal.     Gait: Gait normal.     Deep Tendon Reflexes: Reflexes normal.  Psychiatric:        Mood and Affect: Mood normal.        Behavior: Behavior normal.        Thought Content: Thought content normal.        Judgment: Judgment  normal.      Assessment/plan: KELTY SZAFRAN is a 63 y.o. female present for CPE and chronic condition management Malignant melanoma metastatic to brain [facility]) (Primary)/melanoma metastatic to lung Patient is established with oncologist with regular routine visits and imaging.  Grief/anxiety Patient recently lost her husband in October 2024 to cancer.  His passing was sudden after not expected diagnosis of cancer. Recommended grief sharing at her church.  She declined therapy referral today.  Continue Lexapro  10 mg daily  Chronic daily headache Stable Continue gabapentin  900 mg nightly, gabapentin  100 mg daily  Adrenal insufficiency (HCC)/hypothyroidism Conditions are managed by endocrine.  Dr. Beryl  Absence seizure [facility]) Patient has neurology established appointment 01/09/2024 with [facility]   Mixed hyperlipidemia/aortic atherosclerosis.ca score 88th %- 98 Patient established with cardiology-Dr. Jeffrie Continue Crestor  5 mg daily  Osteoporosis/vitamin D  deficiency: Patient is prescribed Fosamax 70 mg weekly. She supplements with vitamin D  and calcium . Managed by her endocrine team DEXA completed (-3.0)-managed by endocrine   Breast cancer screening by mammogram - MM 3D SCREENING MAMMOGRAM BILATERAL BREAST; Future Need for zoster vaccination Printed for her  Type 2 diabetes mellitus without complication, without long-term current use of insulin (HCC) Continue metformin  500 mg bid - CBC - Comprehensive metabolic panel with GFR - Hemoglobin A1c - Lipid panel - Urine Microalbumin w/creat. Ratio  Routine general medical examination at a health care facility (Primary) - CBC - Comprehensive metabolic panel with GFR Colon cancer screening: Completed 11/18/2014 established with digestive health plan EGD and colonoscopy 06/2024 Breast cancer screening:will schedule at [facility] bus next week Cervical cancer screening:declined Immunizations: Tdap UTD 06/03/2015,  influenza vaccine encouraged yearly, Shingrix-printed, Prevnar 20 completed Infectious disease screening: HIV and hepatitis C screenings completed DEXA: Completed 05/28/2024.  Osteoporosis T-score -3.0, ordered by endocrinology Dr. Beryl Patient was encouraged to exercise greater than 150 minutes a week. Patient was encouraged to choose a diet filled with fresh fruits and vegetables, and lean meats. AVS provided to patient today for education/recommendation on gender specific health and safety maintenance.  Return in about 24 weeks (around 12/27/2024) for Routine chronic condition follow-up.  Orders Placed This Encounter  Procedures   MM 3D SCREENING MAMMOGRAM BILATERAL BREAST   CBC   Comprehensive metabolic panel with GFR   Hemoglobin A1c   Lipid panel   Urine Microalbumin w/creat. ratio   Vitamin D  (25 hydroxy)   Meds ordered this encounter  Medications   escitalopram  (LEXAPRO ) 10 MG tablet    Sig: Take 1 tablet (10 mg total) by mouth daily.    Dispense:  90 tablet    Refill:  1   Zoster Vaccine Adjuvanted [facility]) injection    Sig:  Inject 0.5 mLs into the muscle once for 1 dose. Rpt injection once in 2-6 months    Dispense:  1 each    Refill:  1   metFORMIN  (GLUCOPHAGE ) 500 MG tablet    Sig: Take 1 tablet (500 mg total) by mouth 2 (two) times daily with a meal.    Dispense:  180 tablet    Refill:  1   gabapentin  (NEURONTIN ) 600 MG tablet    Sig: Take 1.5 tablets (900 mg total) by mouth at bedtime.    Dispense:  135 tablet    Refill:  1   rosuvastatin  (CRESTOR ) 5 MG tablet    Sig: Take 1 tablet (5 mg total) by mouth daily.    Dispense:  90 tablet    Refill:  3   Referral Orders  No referral(s) requested today     Note is dictated utilizing voice recognition software. Although note has been proof read prior to signing, occasional typographical errors still can be missed. If any questions arise, please do not hesitate to call for verification.  Electronically signed  by: Charlies Bellini, DO [facility] [place]

## 2024-07-15 ENCOUNTER — Ambulatory Visit: Payer: Self-pay | Admitting: Family Medicine

## 2024-07-15 MED ORDER — SITAGLIPTIN PHOSPHATE 50 MG PO TABS: 50.0000 mg | ORAL_TABLET | Freq: Every day | ORAL | 1 refills | Status: DC

## 2024-07-15 NOTE — Telephone Encounter (Signed)
 Please call patient A1c is 7.8, mildly improved from 8.3 prior this is still high. She needs to continue the metformin  twice daily, and start a medication called Januvia  once daily to protect her A1c closer to 6.5. -Continue to follow diabetic diet and routine exercise.  Liver and kidney function were normal.   Blood cell counts and electrolytes normal with the exception of an elevated glucose of 239.  Cholesterol panel at goal. Vitamin D  levels are normal not great at 60. Urine protein levels are normal.

## 2024-07-19 ENCOUNTER — Ambulatory Visit
Admission: RE | Admit: 2024-07-19 | Discharge: 2024-07-19 | Disposition: A | Discharge status: Home / Self Care | Source: Ambulatory Visit | Attending: Family Medicine | Admitting: Family Medicine

## 2024-07-19 DIAGNOSIS — Z1231 Encounter for screening mammogram for malignant neoplasm of breast: Secondary | ICD-10-CM | POA: Diagnosis not present

## 2024-07-24 ENCOUNTER — Encounter: Payer: Self-pay | Admitting: Family Medicine

## 2024-07-25 NOTE — Telephone Encounter (Signed)
 Please forward this to pharmacy assistance, so we can provide the best option to treat her diabetes and also financially best option for her personally.  Would like to attempt to avoid sulfonylurea class due to pt having dizziness symptoms with her adrenal insuffiencey.    Please inform patient we are sending this to our pharmacy assistance program and they may be reaching out to her to discuss.

## 2024-07-30 ENCOUNTER — Telehealth: Payer: Self-pay

## 2024-07-30 ENCOUNTER — Other Ambulatory Visit: Payer: Self-pay | Admitting: Pharmacist

## 2024-07-30 ENCOUNTER — Encounter: Payer: Self-pay | Admitting: Pharmacist

## 2024-07-30 DIAGNOSIS — E119 Type 2 diabetes mellitus without complications: Secondary | ICD-10-CM

## 2024-07-30 NOTE — Telephone Encounter (Signed)
 PAP: Patient assistance application for Januvia through [employer] has been mailed to pt's home address on file. Provider portion of application will be faxed to provider's office.

## 2024-07-30 NOTE — Progress Notes (Signed)
 07/30/2024 Name: [name] [name] MRN: [id number] DOB: 1961/02/18  Chief Complaint  Patient presents with   Medication Management    [name] [name] is a 63 y.o. year old female who presented for a telephone visit.   They were referred to the pharmacist by their PCP for assistance in managing medication access.   Subjective: Received forwarded message from primary care team that patient's Januvia  would be about $250. She does not have a deductible but all tier 3 medications have a co-insurance of 25% of medication cost.   Care Team: Primary Care Provider: Catherine Charlies DELENA, DO ; Next Scheduled Visit: 12/25/2024  Medication Access/Adherence  Current Pharmacy:  [id number] - [place], [place] - [address] [place] [address] Phone: [phone] Fax: [phone]  [facility] [place], [place] - [address] [place] [address] Phone: [phone] Fax: [phone]   Patient reports affordability concerns with their medications: Yes  Patient reports access/transportation concerns to their pharmacy: No  Patient reports adherence concerns with their medications:  No     Currently taking metformin  500mg  twice a day for diabetes. A1c is not at goal.  Dr [name] would like to avoid sulfonylureas due to patient already has dizziness from adrenal insuffiencey .    Objective:  Lab Results  Component Value Date   HGBA1C 7.8 (H) 07/12/2024    Lab Results  Component Value Date   CREATININE 0.64 07/12/2024   BUN 14 07/12/2024   NA 140 07/12/2024   K 3.6 07/12/2024   CL 105 07/12/2024   CO2 26 07/12/2024    Lab Results  Component Value Date   CHOL 110 07/12/2024   HDL 41.50 07/12/2024   LDLCALC 24 07/12/2024   TRIG 221.0 (H) 07/12/2024   CHOLHDL 3 07/12/2024    Current Outpatient Medications  Medication Instructions   alendronate (FOSAMAX) 70 mg, Weekly   aspirin  EC 81 mg,  Oral, Daily, Swallow whole.   Blood Glucose Monitoring Suppl (ONETOUCH VERIO FLEX SYSTEM) w/Device KIT 1 each   calcium  carbonate (SUPER CALCIUM ) 1500 (600 Ca) MG TABS tablet 1 tablet with meals Orally Twice a day   D 1000 2,000 Units, Daily   doxylamine (Sleep) (UNISOM) 25 mg, Daily at bedtime   escitalopram  (LEXAPRO ) 10 mg, Oral, Daily   gabapentin  (NEURONTIN ) 900 mg, Oral, Daily at bedtime   hydrocortisone  (CORTEF ) 5 MG tablet Take by mouth.   hydrocortisone  sodium succinate (SOLU-CORTEF ) 100 mg   Lancets 33G MISC Use to check blood sugar 1 time(s) daily   levothyroxine  (SYNTHROID ) 100 mcg   metFORMIN  (GLUCOPHAGE ) 500 mg, Oral, 2 times daily with meals   Multiple Vitamin (MULTI-VITAMIN) tablet 1 tablet, Daily   ondansetron  (ZOFRAN -ODT) 4 mg, Oral, Every 8 hours PRN   ONETOUCH VERIO test strip Use to check blood sugar 1 time(s) daily.   potassium chloride  (KLOR-CON ) 10 MEQ tablet 20 mEq, Daily   promethazine  (PHENERGAN ) 25 mg, Rectal, Every 6 hours PRN   rosuvastatin  (CRESTOR ) 5 mg, Oral, Daily   sitaGLIPtin  (JANUVIA ) 50 mg, Oral, Daily      Assessment/Plan:   Medication Management / Access - Patient would likely qualify for for Januvia  medication assistance program. Forwarded her link to application thru MyChart message. Patient will print and start application.  - Will forward to medication assistance team to follow up on application and to send PCP portion to Dr [name].  Follow Up Plan: 2 to 3 weeks.   Madelin Ray, PharmD Clinical Pharmacist [facility]  Population Health [phone]

## 2024-08-01 NOTE — Telephone Encounter (Signed)
 Received medication assistance form for Januvia .  Form completed and returned to [facility] work basket.

## 2024-08-01 NOTE — Telephone Encounter (Signed)
 Received provider's portion of Pap application ([employer]) Januvia .

## 2024-08-01 NOTE — Telephone Encounter (Signed)
 Forms faxed

## 2024-08-06 ENCOUNTER — Encounter: Payer: Self-pay | Admitting: Pharmacist

## 2024-08-06 ENCOUNTER — Other Ambulatory Visit: Payer: Self-pay | Admitting: Pharmacist

## 2024-08-06 NOTE — Progress Notes (Signed)
 Opened in error

## 2024-08-06 NOTE — Progress Notes (Signed)
 08/06/2024 Name: [name] [name] MRN: [id number] DOB: 07/03/1961  Chief Complaint  Patient presents with   Medication Management    [name] [name] is a 63 y.o. year old female who presented for a telephone visit.   They were referred to the pharmacist by their PCP for assistance in managing medication access.   Subjective: Received forwarded message from primary care team that patient's Januvia  would be about $250. She does not have a deductible but all tier 3 medications have a co-insurance of 25% of medication cost.   Care Team: Primary Care Provider: Catherine Charlies DELENA, DO ; Next Scheduled Visit: 12/25/2024  Medication Access/Adherence  Current Pharmacy:  [id number] - [place], [place] - [address] [place] [address] [place] [address] [place] [address] Phone: [phone] Fax: [phone]  [facility] [place], [place] - [address] [place] [address] Phone: [phone] Fax: [phone]   Patient reports affordability concerns with their medications: Yes  Patient reports access/transportation concerns to their pharmacy: No  Patient reports adherence concerns with their medications:  No     Currently taking metformin  500mg  twice a day for diabetes. A1c is not at goal.  Dr Catherine would like to avoid sulfonylureas due to patient already has dizziness from adrenal insuffiencey.   Sent link to application for [employer] / Januvia  last week but patient was not able to open it because her phone was not secure. Paper application has been mailed to patient 9/4 but she has not received yet.  Dr Kuneff has completed her portion of the application.    Objective:  Lab Results  Component Value Date   HGBA1C 7.8 (H) 07/12/2024    Lab Results  Component Value Date   CREATININE 0.64 07/12/2024   BUN 14 07/12/2024   NA 140 07/12/2024   K 3.6 07/12/2024   CL 105 07/12/2024   CO2 26 07/12/2024    Lab Results   Component Value Date   CHOL 110 07/12/2024   HDL 41.50 07/12/2024   LDLCALC 24 07/12/2024   TRIG 221.0 (H) 07/12/2024   CHOLHDL 3 07/12/2024    Current Outpatient Medications  Medication Instructions   alendronate (FOSAMAX) 70 mg, Weekly   aspirin  EC 81 mg, Oral, Daily, Swallow whole.   Blood Glucose Monitoring Suppl (ONETOUCH VERIO FLEX SYSTEM) w/Device KIT 1 each   calcium  carbonate (SUPER CALCIUM ) 1500 (600 Ca) MG TABS tablet 1 tablet with meals Orally Twice a day   D 1000 2,000 Units, Daily   doxylamine (Sleep) (UNISOM) 25 mg, Daily at bedtime   escitalopram  (LEXAPRO ) 10 mg, Oral, Daily   gabapentin  (NEURONTIN ) 900 mg, Oral, Daily at bedtime   hydrocortisone  (CORTEF ) 5 MG tablet Take by mouth.   hydrocortisone  sodium succinate (SOLU-CORTEF ) 100 mg   Lancets 33G MISC Use to check blood sugar 1 time(s) daily   levothyroxine  (SYNTHROID ) 100 mcg   metFORMIN  (GLUCOPHAGE ) 500 mg, Oral, 2 times daily with meals   Multiple Vitamin (MULTI-VITAMIN) tablet 1 tablet, Daily   ondansetron  (ZOFRAN -ODT) 4 mg, Oral, Every 8 hours PRN   ONETOUCH VERIO test strip Use to check blood sugar 1 time(s) daily.   potassium chloride  (KLOR-CON ) 10 MEQ tablet 20 mEq, Daily   promethazine  (PHENERGAN ) 25 mg, Rectal, Every 6 hours PRN   rosuvastatin  (CRESTOR ) 5 mg, Oral, Daily   sitaGLIPtin  (JANUVIA ) 50 mg, Oral, Daily      Assessment/Plan:   Medication  Management / Access - Patient would likely qualify for for Januvia  medication assistance program. She has not received mailed application yet but will complete ASAP when she does. Dr Catherine has completed and returned provider portion of application to medication assistance team - Suzen Ket.     Follow Up Plan: 2 to 3 weeks.   Madelin Ray, PharmD Clinical Pharmacist [facility]  Population Health [phone]

## 2024-08-08 DIAGNOSIS — E063 Autoimmune thyroiditis: Secondary | ICD-10-CM | POA: Diagnosis not present

## 2024-08-08 DIAGNOSIS — E119 Type 2 diabetes mellitus without complications: Secondary | ICD-10-CM | POA: Diagnosis not present

## 2024-08-08 DIAGNOSIS — E273 Drug-induced adrenocortical insufficiency: Secondary | ICD-10-CM | POA: Diagnosis not present

## 2024-08-13 NOTE — Telephone Encounter (Signed)
 Reached out to patient regarding Pap application Januvia  [employer]) and it was received via mail. Patient will fill out application this week and return to our team.

## 2024-08-15 ENCOUNTER — Ambulatory Visit: Payer: Self-pay

## 2024-08-15 DIAGNOSIS — S60512A Abrasion of left hand, initial encounter: Secondary | ICD-10-CM | POA: Diagnosis not present

## 2024-08-15 NOTE — Telephone Encounter (Signed)
 FYI Only or Action Required?: FYI only for provider.  Patient was last seen in primary care on 07/12/2024 by Catherine Fuller A, DO.  Called Nurse Triage reporting Hand Pain, hand swelling, and blue/white areas to hand and red spot.  Symptoms began yesterday.  Interventions attempted: Rest, hydration, or home remedies.  Symptoms are: gradually worsening.  Triage Disposition: Go to ED Now (or PCP Triage)  Patient/caregiver understands and will follow disposition?: No, refuses disposition     Copied from CRM [id number]. Topic: Clinical - Red Word Triage >> Aug 15, 2024  9:30 AM Pinkey ORN wrote: Red Word that prompted transfer to Nurse Triage: Discoloration Of Hand (Left) >> Aug 15, 2024  9:34 AM Pinkey ORN wrote: Patient states she injured her hand and it's now turning blue and causing some pain. Patient also mentions that it's swollen.      Reason for Disposition  Patient sounds very sick or weak to the triager  Answer Assessment - Initial Assessment Questions Advised pt go to ED for symptoms, pt refusing, states she will go to UC near her home instead. Advised pt go to ED for any worsening especially if seeming like hand/finger losing circulation. Alerted CAL to ED refusal.    1. ONSET: When did the pain start?     Injured hand while gardening yesterday or day before 2. LOCATION: Where is the pain located?     Left hand 3. PAIN: How bad is the pain? (Scale 1-10; or mild, moderate, severe)     Can move it but hurts little bit Not severe pain, 6/10 7. OTHER SYMPTOMS: Do you have any other symptoms? (e.g., fever, neck pain, numbness or tingling, rash, swelling)     Just at middle finger knuckle, just the palm a little bit and red spot Little bit swollen Kind of blue white, Confirms not like usual bruising, Not looking like other hand No fever, numbness or tingling Not crooked or disformed No SOB, chest pain, nausea, vomiting per pt report  Protocols used: Hand  Pain-A-AH

## 2024-08-15 NOTE — Telephone Encounter (Signed)
 noted

## 2024-08-16 ENCOUNTER — Telehealth: Payer: Self-pay | Admitting: Pharmacist

## 2024-08-16 ENCOUNTER — Other Ambulatory Visit: Payer: Self-pay | Admitting: Pharmacist

## 2024-08-16 NOTE — Telephone Encounter (Signed)
 Attempt was made to contact patient by phone today for follow up by Clinical Pharmacist regarding Januvia  cost / patient assistance program application.  Unable to reach patient. LM on VM with my contact number [phone].

## 2024-08-23 NOTE — Telephone Encounter (Signed)
 Reached out to patient again regarding PAP application for Januvia  [employer]) and she will complete application and mail back this week.

## 2024-08-27 NOTE — Progress Notes (Signed)
 [name] [name]                                          MRN: [id number]   08/27/2024   The [facility] Quality Team Specialist reviewed this patient medical record for the purposes of chart review for care gap closure. The following were reviewed: abstraction for care gap closure-kidney health evaluation for diabetes:eGFR  and uACR.    [facility] Quality Team

## 2024-09-06 ENCOUNTER — Telehealth: Payer: Self-pay | Admitting: Pharmacist

## 2024-09-06 NOTE — Telephone Encounter (Signed)
 Following up with patient regarding Januvia  patient assistance program application. She has seen Dr Beryl, endocrinologist who has started Tresiba 10 units once a day. She is still taking metformin  500mg  twice a day and blood glucose has been improved.  She has decided not to pursue medication assistance program application for Januvia .   Updated her med records. No additional follow up needed at this time but if she has any future medication cost issues and questions she can contact clinical pharmacist.

## 2024-09-25 DIAGNOSIS — E119 Type 2 diabetes mellitus without complications: Secondary | ICD-10-CM | POA: Diagnosis not present

## 2024-09-26 NOTE — Progress Notes (Signed)
 DEVRA STARE                                          MRN: [id number]   09/26/2024   The [facility] Quality Team Specialist reviewed this patient medical record for the purposes of chart review for care gap closure. The following were reviewed: chart review for care gap closure-diabetic eye exam.    [facility] Quality Team

## 2024-09-30 DIAGNOSIS — E119 Type 2 diabetes mellitus without complications: Secondary | ICD-10-CM | POA: Diagnosis not present

## 2024-09-30 DIAGNOSIS — E063 Autoimmune thyroiditis: Secondary | ICD-10-CM | POA: Diagnosis not present

## 2024-09-30 DIAGNOSIS — E273 Drug-induced adrenocortical insufficiency: Secondary | ICD-10-CM | POA: Diagnosis not present

## 2024-10-02 DIAGNOSIS — I951 Orthostatic hypotension: Secondary | ICD-10-CM | POA: Diagnosis not present

## 2024-10-02 DIAGNOSIS — E139 Other specified diabetes mellitus without complications: Secondary | ICD-10-CM | POA: Diagnosis not present

## 2024-10-02 DIAGNOSIS — E063 Autoimmune thyroiditis: Secondary | ICD-10-CM | POA: Diagnosis not present

## 2024-10-02 DIAGNOSIS — E273 Drug-induced adrenocortical insufficiency: Secondary | ICD-10-CM | POA: Diagnosis not present

## 2024-10-18 DIAGNOSIS — C7931 Secondary malignant neoplasm of brain: Secondary | ICD-10-CM | POA: Diagnosis not present

## 2024-10-21 DIAGNOSIS — C7931 Secondary malignant neoplasm of brain: Secondary | ICD-10-CM | POA: Diagnosis not present

## 2024-10-30 DIAGNOSIS — E063 Autoimmune thyroiditis: Secondary | ICD-10-CM | POA: Diagnosis not present

## 2024-10-30 DIAGNOSIS — I951 Orthostatic hypotension: Secondary | ICD-10-CM | POA: Diagnosis not present

## 2024-10-30 DIAGNOSIS — E273 Drug-induced adrenocortical insufficiency: Secondary | ICD-10-CM | POA: Diagnosis not present

## 2024-11-26 LAB — BASIC METABOLIC PANEL WITH GFR
BUN: 7 (ref 4–21)
CO2: 26 — AB (ref 13–22)
Chloride: 101 (ref 99–108)
Creatinine: 0.6 (ref 0.5–1.1)
Glucose: 220
Potassium: 3.9 meq/L (ref 3.5–5.1)
Sodium: 137 (ref 137–147)

## 2024-11-26 LAB — PROTEIN / CREATININE RATIO, URINE
Albumin, U: <3
Creatinine, Urine: 20.6

## 2024-11-26 LAB — COMPREHENSIVE METABOLIC PANEL WITH GFR
Calcium: 8.9 (ref 8.7–10.7)
eGFR: 103

## 2024-11-26 LAB — MICROALBUMIN / CREATININE URINE RATIO: Microalb Creat Ratio: <15

## 2024-11-27 ENCOUNTER — Telehealth: Payer: Self-pay

## 2024-11-27 NOTE — Transitions of Care (Post Inpatient/ED Visit) (Signed)
 "  11/27/2024  Name: [name] [name] MRN: [id number] DOB: January 21, 1961  Today's TOC FU Call Status: Today's TOC FU Call Status:: Successful TOC FU Call Completed TOC FU Call Complete Date: 11/27/24  Patient's Name and Date of Birth confirmed. Name, DOB  Transition Care Management Follow-up Telephone Call How have you been since you were released from the hospital?: Better Any questions or concerns?: No  Items Reviewed: Did you receive and understand the discharge instructions provided?: Yes Medications obtained,verified, and reconciled?: Yes (Medications Reviewed) Any new allergies since your discharge?: No Dietary orders reviewed?: Yes Type of Diet Ordered:: DM diet Do you have support at home?: Yes People in Home [RPT]: friend(s)  Medications Reviewed Today: Medications Reviewed Today     Reviewed by Rumalda Alan PENNER, RN (Registered Nurse) on 11/27/24 at 1442  Med List Status: <None>   Medication Order Taking? Sig Documenting Provider Last Dose Status Informant  acetaminophen  (TYLENOL ) 500 MG tablet [id number] Yes Take 1,000 mg by mouth every 6 (six) hours as needed. [provider]  Active   alendronate (FOSAMAX) 70 MG tablet [id number] Yes Take 70 mg by mouth once a week. [provider]  Active Self, Pharmacy Records  aspirin  EC 81 MG tablet [id number] Yes Take 1 tablet (81 mg total) by mouth daily. Swallow whole. Claudene Victory ORN, MD  Active Self, Pharmacy Records  Blood Glucose Monitoring Suppl [facility] VERIO FLEX SYSTEM) w/Device KIT [id number] Yes 1 each by Does not apply route. [provider]  Active   calcium  carbonate (SUPER CALCIUM ) 1500 (600 Ca) MG TABS tablet [id number] Yes 1 tablet with meals Orally Twice a day [provider]  Active Self, Pharmacy Records  D 1000 25 MCG (1000 UT) capsule [id number] Yes Take 2,000 Units by mouth daily. [provider]  Active Self, Pharmacy Records  doxylamine, Sleep, (UNISOM) 25 MG tablet  [id number] Yes Take 25 mg by mouth at bedtime. [provider]  Active   escitalopram  (LEXAPRO ) 10 MG tablet [id number] Yes Take 1 tablet (10 mg total) by mouth daily. Kuneff, Renee A, DO  Active   gabapentin  (NEURONTIN ) 600 MG tablet [id number] Yes Take 1.5 tablets (900 mg total) by mouth at bedtime. Kuneff, Renee A, DO  Active   hydrocortisone  (CORTEF ) 5 MG tablet [id number] Yes Take by mouth.  Patient taking differently: Take 2 tablets by mouth every morning.   [provider]  Active   hydrocortisone  (CORTEF ) 5 MG tablet [id number] Yes Take 5 mg by mouth 2 (two) times daily. At 11 am, 4pm. [provider]  Active   hydrocortisone  sodium succinate (SOLU-CORTEF ) 100 MG injection [id number]  Inject 100 mg into the muscle.  Patient not taking: Reported on 11/27/2024   [provider]  Active   Lancets 33G MISC [id number] Yes Use to check blood sugar 1 time(s) daily [provider]  Active   levothyroxine  (SYNTHROID ) 100 MCG tablet [id number] Yes Take 75 mcg by mouth daily before breakfast. [provider]  Active   magnesium  oxide (MAG-OX) 400 (240 Mg) MG tablet [id number] Yes Take 400 mg by mouth daily. [provider]  Active   metFORMIN  (GLUCOPHAGE ) 500 MG tablet [id number]  Take 1 tablet (500 mg total) by mouth 2 (two) times daily with a meal.  Patient not taking: Reported on 11/27/2024   Catherine, Renee A, DO  Active   Multiple Vitamin (MULTI-VITAMIN) tablet [id number]  Take 1 tablet by mouth daily.  Patient not taking: Reported on 11/27/2024   [provider]  Active Self, Pharmacy Records  ondansetron  (ZOFRAN -ODT) 4 MG disintegrating tablet [id number] Yes Take 1 tablet (4 mg total) by mouth every 8 (eight) hours as needed for nausea or vomiting. Kuneff, Renee A, DO  Active   ONETOUCH VERIO test strip [id number] Yes Use to check blood sugar 1 time(s) daily. [provider]  Active   potassium chloride  (KLOR-CON ) 10 MEQ  tablet [id number] Yes Take 20 mEq by mouth daily. [provider]  Active Self, Pharmacy Records  promethazine  (PHENERGAN ) 25 MG suppository [id number] Yes Place 1 suppository (25 mg total) rectally every 6 (six) hours as needed for nausea or vomiting. Harris, Abigail, PA-C  Active   rosuvastatin  (CRESTOR ) 5 MG tablet [id number] Yes Take 1 tablet (5 mg total) by mouth daily. Kuneff, Renee A, DO  Active   TRESIBA FLEXTOUCH 100 UNIT/ML FlexTouch Pen [id number] Yes Inject 10 Units into the skin daily.  Patient taking differently: Inject 5 Units into the skin daily.   [provider]  Active             Home Care and Equipment/Supplies: Were Home Health Services Ordered?: No Any new equipment or medical supplies ordered?: No  Functional Questionnaire: Do you need assistance with bathing/showering or dressing?: No Do you need assistance with meal preparation?: No Do you need assistance with eating?: No Do you have difficulty maintaining continence: No Do you need assistance with getting out of bed/getting out of a chair/moving?: No Do you have difficulty managing or taking your medications?: No  Follow up appointments reviewed: PCP Follow-up appointment confirmed?: Yes MD Provider Line [phone] Given: No Date of PCP follow-up appointment?: 12/04/24 Follow-up Provider: PCP Specialist Hospital Follow-up appointment confirmed?: Yes Date of Specialist follow-up appointment?: 12/03/24 Follow-Up Specialty Provider:: Dr.Levy Do you need transportation to your follow-up appointment?: No Do you understand care options if your condition(s) worsen?: Yes-patient verbalized understanding  SDOH Interventions Today    Flowsheet Row Most Recent Value  SDOH Interventions   Food Insecurity Interventions Intervention Not Indicated  Housing Interventions Intervention Not Indicated  Transportation Interventions Intervention Not Indicated  Utilities Interventions Intervention  Not Indicated   Placed call to patient who reports that she is doing well. No NVD since hospital discharge. Reports that she is taking her medications as prescribed with the exception of metformin .  States CBG today of 77. Reviewed importance of follow up with endocrinology and PCP.   Encouraged patient to follow DM diet.  Reviewed and offered 30 day TOC and patient declined.   Alan Ee, RN, BSN, CEN Population Health- Transition of Care Team.  [facility] [phone]   "

## 2024-11-27 NOTE — Transitions of Care (Post Inpatient/ED Visit) (Signed)
" ° °  11/27/2024  Name: [name] [name] MRN: [id number] DOB: October 26, 1961  Today's TOC FU Call Status: Today's TOC FU Call Status:: Unsuccessful Call (1st Attempt) Unsuccessful Call (1st Attempt) Date: 11/27/24  Attempted to reach the patient regarding the most recent Inpatient/ED visit.  Follow Up Plan: Additional outreach attempts will be made to reach the patient to complete the Transitions of Care (Post Inpatient/ED visit) call.   Alan Ee, RN, BSN, CEN Population Health- Transition of Care Team.  [facility] [phone]  "

## 2024-12-04 ENCOUNTER — Ambulatory Visit: Admitting: Family Medicine

## 2024-12-04 ENCOUNTER — Encounter: Payer: Self-pay | Admitting: Family Medicine

## 2024-12-04 VITALS — BP 108/68 | HR 72 | Temp 98.0°F | Wt 110.4 lb

## 2024-12-04 DIAGNOSIS — E2740 Unspecified adrenocortical insufficiency: Secondary | ICD-10-CM

## 2024-12-04 DIAGNOSIS — E860 Dehydration: Secondary | ICD-10-CM

## 2024-12-04 DIAGNOSIS — E119 Type 2 diabetes mellitus without complications: Secondary | ICD-10-CM

## 2024-12-04 DIAGNOSIS — Z9289 Personal history of other medical treatment: Secondary | ICD-10-CM

## 2024-12-04 NOTE — Patient Instructions (Signed)

## 2024-12-04 NOTE — Progress Notes (Signed)
 "     [name] [name] , [birth year], 64 y.o., female MRN: [id number] Patient Care Team    Relationship Specialty Notifications Start End  Catherine Charlies DELENA, DO PCP - General Family Medicine  12/04/23   Edmund Mose Koyanagi, MD Referring Physician Radiation Oncology  12/04/23   Jeffrie Oneil BROCKS, MD Consulting Physician Cardiology  12/04/23   Winthrop Earing, MD Referring Physician Internal Medicine  12/04/23    Comment: oncologist for Melanoma  [name]    12/04/23   Beryl Donnice BRAVO, MD Referring Physician Endocrinology  07/12/24   Jefrey Bruckner, MD Referring Physician Gastroenterology  07/12/24     Chief Complaint  Patient presents with   Hospitalization Follow-up     Subjective:  [name] [name]  is a 64 y.o. female presents for hospital follow up after recent admission on 11/22/2024 for primary diagnosis dehydration.  Patient was discharged on 11/26/2024 to home. Patients discharge summary has been reviewed, as well as all labs/image studies obtained during hospitalization.  Medication reconciliation completed today.  Patients hospital course: Patient presented to the ED after a few day history of not feeling well, eating or drinking well with symptoms of nausea, vomiting and diarrhea.  Her sugars were found to be elevated here 400.  Patient is a diabetic and also adrenal insufficiency requiring chronic steroid use. Chest x-ray resulted with nonobstructive bowel gas pattern and no acute cardiopulmonary disease patient was hydrated with IV saline boluses and then subsequently placed on lactated Ringer 's.  IV Solu-Cortef  was started.  Steroids were transitioned to her home regimen prior to discharge. Since hospital discharge patient reports patient reports she feels much improved and back to her baseline.  She was able to see her endocrinologist yesterday who has kept her medications the same with levothyroxine  75 mcg daily and Tresiba 5 units daily.  No results for  input(s): HGB, HCT, WBC, PLT in the last 168 hours.    Latest Ref Rng & Units 07/12/2024    7:48 AM 05/21/2024    9:53 AM 05/10/2024   12:11 PM  CMP  Glucose 70 - 99 mg/dL 760  859  553   BUN 6 - 23 mg/dL 14  11  20    Creatinine 0.40 - 1.20 mg/dL 9.35  9.34  8.94   Sodium 135 - 145 mEq/L 140  137  129   Potassium 3.5 - 5.1 mEq/L 3.6  4.1  4.6   Chloride 96 - 112 mEq/L 105  100  95   CO2 19 - 32 mEq/L 26  30  20    Calcium  8.4 - 10.5 mg/dL 9.3  9.6  89.8   Total Protein 6.0 - 8.3 g/dL 6.1   7.8   Total Bilirubin 0.2 - 1.2 mg/dL 0.3   0.9   Alkaline Phos 39 - 117 U/L 82     AST 0 - 37 U/L 18   22   ALT 0 - 35 U/L 18   30       No results found.      12/04/2024   10:32 AM 11/27/2024    2:41 PM 07/12/2024    7:41 AM 04/01/2024    4:46 PM 02/19/2024    7:54 AM  Depression screen PHQ 2/9  Decreased Interest 0 0 0  1  Down, Depressed, Hopeless 0 0 1  0  PHQ - 2 Score 0 0 1  1  Altered sleeping 0  0    Tired, decreased energy 3  1  1  Change in appetite 1  0  1  Feeling bad or failure about yourself  0  0    Trouble concentrating 0  0  0  Moving slowly or fidgety/restless 0  0  1  Suicidal thoughts 0  0  0  PHQ-9 Score 4  2     Difficult doing work/chores Not difficult at all  Not difficult at all  Not difficult at all     Information is confidential and restricted. Go to Review Flowsheets to unlock data.   Data saved with a previous flowsheet row definition      12/04/2023   11:23 AM 02/19/2024    7:54 AM 07/12/2024    7:41 AM 12/04/2024   10:32 AM 12/04/2024   10:33 AM  Fall Risk  Falls in the past year? 0 1 1 0 1  Was there an injury with Fall? 0  0  0  0 0  Fall Risk Category Calculator 0 1 1 0 1  Patient at Risk for Falls Due to No Fall Risks   No Fall Risks No Fall Risks  Fall risk Follow up Falls evaluation completed Falls evaluation completed Falls evaluation completed Falls evaluation completed Falls evaluation completed     Data saved with a previous flowsheet  row definition    Allergies[1] Social History   Tobacco Use   Smoking status: Former    Current packs/day: 0.00    Types: Cigarettes    Quit date: 11/19/2013    Years since quitting: 11.0   Smokeless tobacco: Never  Substance Use Topics   Alcohol use: No    Alcohol/week: 0.0 standard drinks of alcohol   Past Medical History:  Diagnosis Date   Abnormal electrocardiogram 07/30/2014   Absence seizure (HCC) 12/04/2023   Acute encephalopathy 03/21/2015   Anxiety    Brain cancer (HCC)    Candidiasis of mouth 12/16/2013   Colitis 03/17/2014   GERD (gastroesophageal reflux disease)    Hyperacusia    Hypokalemia 05/10/2024   Hyponatremia 05/14/2024   Hypothyroidism    Left homonymous hemianopsia    secondary to right posterior temporal and occipital lesions.   Lung cancer (HCC)    Lung nodule 11/16/2013   Metastatic melanoma- wedge resection 03/05/2014     Melanoma (HCC)    With metastasis to lung and brain   Nasal valve collapse 06/23/2021   Other headache syndrome    Pyelonephritis 12/2020   S/P craniotomy 11/19/2013   Syncope and collapse 03/31/2015   TMJ (dislocation of temporomandibular joint)    Past Surgical History:  Procedure Laterality Date   APPENDECTOMY  1979   BREAST BIOPSY Left    LUMBAR WOUND DEBRIDEMENT N/A 12/05/2013   Procedure: irrigation and debridement posterior cervical wound;  Surgeon: Alm GORMAN Molt, MD;  Location: [facility];  Service: Neurosurgery;  Laterality: N/A;   SUBOCCIPITAL CRANIECTOMY CERVICAL LAMINECTOMY N/A 11/19/2013   Procedure: SUBOCCIPITAL CRANIECTOMY for tumor;  Surgeon: Alm GORMAN Molt, MD;  Location: [facility];  Service: Neurosurgery;  Laterality: N/A;   TONSILLECTOMY     VIDEO ASSISTED THORACOSCOPY (VATS)/WEDGE RESECTION Left 03/05/2014   Procedure: VIDEO ASSISTED THORACOSCOPY (VATS)/WEDGE RESECTION;  Surgeon: Elspeth JAYSON Millers, MD;  Location: [facility] OR;  Service: Thoracic;  Laterality: Left;   Family History  Problem  Relation Age of Onset   Alzheimer's disease Mother    Allergies as of 12/04/2024       Reactions   Morphine  And Codeine  Nausea And  Vomiting   Scopolamine Rash        Medication List        Accurate as of December 04, 2024 10:37 AM. If you have any questions, ask your nurse or doctor.          acetaminophen  500 MG tablet Commonly known as: TYLENOL  Take 1,000 mg by mouth every 6 (six) hours as needed.   alendronate 70 MG tablet Commonly known as: FOSAMAX Take 70 mg by mouth once a week.   aspirin  EC 81 MG tablet Take 1 tablet (81 mg total) by mouth daily. Swallow whole.   D 1000 25 MCG (1000 UT) capsule Generic drug: Cholecalciferol Take 2,000 Units by mouth daily.   doxylamine (Sleep) 25 MG tablet Commonly known as: UNISOM Take 25 mg by mouth at bedtime.   escitalopram  10 MG tablet Commonly known as: Lexapro  Take 1 tablet (10 mg total) by mouth daily.   gabapentin  600 MG tablet Commonly known as: NEURONTIN  Take 1.5 tablets (900 mg total) by mouth at bedtime.   hydrocortisone  5 MG tablet Commonly known as: CORTEF  Take by mouth. What changed:  how much to take when to take this   hydrocortisone  5 MG tablet Commonly known as: CORTEF  Take 5 mg by mouth 2 (two) times daily. At 11 am, 4pm. What changed: Another medication with the same name was changed. Make sure you understand how and when to take each.   hydrocortisone  sodium succinate 100 MG injection Commonly known as: SOLU-CORTEF  Inject 100 mg into the muscle.   Lancets 33G Misc Use to check blood sugar 1 time(s) daily   levothyroxine  100 MCG tablet Commonly known as: SYNTHROID  Take 75 mcg by mouth daily before breakfast.   magnesium  oxide 400 (240 Mg) MG tablet Commonly known as: MAG-OX Take 400 mg by mouth daily.   metFORMIN  500 MG tablet Commonly known as: GLUCOPHAGE  Take 1 tablet (500 mg total) by mouth 2 (two) times daily with a meal.   Multi-Vitamin tablet Take 1 tablet by mouth  daily.   ondansetron  4 MG disintegrating tablet Commonly known as: ZOFRAN -ODT Take 1 tablet (4 mg total) by mouth every 8 (eight) hours as needed for nausea or vomiting.   OneTouch Verio Flex System w/Device Kit 1 each by Does not apply route.   OneTouch Verio test strip Generic drug: glucose blood Use to check blood sugar 1 time(s) daily.   potassium chloride  10 MEQ tablet Commonly known as: KLOR-CON  Take 20 mEq by mouth daily.   promethazine  25 MG suppository Commonly known as: PHENERGAN  Place 1 suppository (25 mg total) rectally every 6 (six) hours as needed for nausea or vomiting.   rosuvastatin  5 MG tablet Commonly known as: CRESTOR  Take 1 tablet (5 mg total) by mouth daily.   Super Calcium  1500 (600 Ca) MG Tabs tablet Generic drug: calcium  carbonate 1 tablet with meals Orally Twice a day   Tresiba FlexTouch 100 UNIT/ML FlexTouch Pen Generic drug: insulin degludec Inject 10 Units into the skin daily. What changed: how much to take        All past medical history, surgical history, allergies, family history, immunizations and medications were updated in the EMR today and reviewed under the history and medication portions of their EMR.      ROS: Negative, with the exception of above mentioned in HPI   Objective:  BP 108/68   Pulse 72   Temp 98 F (36.7 C)   Wt 110 lb 6.4 oz (50.1 kg)  SpO2 96%   BMI 17.29 kg/m  Body mass index is 17.29 kg/m. Physical Exam Vitals and nursing note reviewed.  Constitutional:      General: She is not in acute distress.    Appearance: Normal appearance. She is not ill-appearing, toxic-appearing or diaphoretic.     Comments: Thin  HENT:     Head: Normocephalic and atraumatic.  Eyes:     General: No scleral icterus.       Right eye: No discharge.        Left eye: No discharge.     Extraocular Movements: Extraocular movements intact.     Conjunctiva/sclera: Conjunctivae normal.     Pupils: Pupils are equal, round, and  reactive to light.  Cardiovascular:     Rate and Rhythm: Normal rate and regular rhythm.     Heart sounds: No murmur heard. Pulmonary:     Effort: Pulmonary effort is normal. No respiratory distress.     Breath sounds: Normal breath sounds. No wheezing, rhonchi or rales.  Musculoskeletal:     Right lower leg: No edema.     Left lower leg: No edema.  Skin:    General: Skin is warm.     Findings: No rash.  Neurological:     Mental Status: She is alert and oriented to person, place, and time. Mental status is at baseline.     Motor: No weakness.     Gait: Gait normal.  Psychiatric:        Mood and Affect: Mood normal.        Behavior: Behavior normal.      Assessment/Plan: AMALEE OLSEN is a 64 y.o. female present for OV for Hospital discharge follow up Dehydration (Primary)/History of recent hospitalization Patient reports she is tolerating p.o. now and making sure maintain hydration efforts.  Type 2 diabetes mellitus without complication, with long-term current use of insulin (HCC)/hypothyroidism Discussed importance of being consistent with a low glycemic diet and compliance with Tresiba 5 units daily per endocrine for diabetes.  She seems to believe that her thyroid  condition is the cause of her symptoms or for her diabetes diagnosis. We also discussed the importance to take her thyroid  medication first thing in the morning on an empty stomach without eating or drinking anything for at least 30 minutes - 1 hour.  Adrenal insufficiency Typically well-managed through endocrinology.  Recent possible adrenal crisis likely secondary to illness or DKA.  Reviewed expectations re: course of current medical issues. Discussed self-management of symptoms. Outlined signs and symptoms indicating need for more acute intervention. Patient verbalized understanding and all questions were answered. Patient received an After-Visit Summary. Any changes in medications were reviewed and  patient was provided with updated med list with their AVS.     No orders of the defined types were placed in this encounter.    Note is dictated utilizing voice recognition software. Although note has been proof read prior to signing, occasional typographical errors still can be missed. If any questions arise, please do not hesitate to call for verification.   electronically signed by:  Charlies Bellini, DO  [facility] [place]       [1]  Allergies Allergen Reactions   Morphine  And Codeine  Nausea And Vomiting   Scopolamine Rash   "

## 2024-12-18 NOTE — Progress Notes (Signed)
 [name] [name]                                          MRN: [id number]   12/18/2024   The [facility] Quality Team Specialist reviewed this patient medical record for the purposes of chart review for care gap closure. The following were reviewed: chart review for care gap closure-colorectal cancer screening and diabetic eye exam.    [facility] Quality Team

## 2024-12-25 ENCOUNTER — Encounter: Payer: Self-pay | Admitting: Family Medicine

## 2024-12-25 ENCOUNTER — Encounter: Admitting: Family Medicine

## 2024-12-25 NOTE — Progress Notes (Signed)
 No show

## 2025-01-01 ENCOUNTER — Encounter: Payer: Self-pay | Admitting: Family Medicine
